# Patient Record
Sex: Male | Born: 1960 | Race: Black or African American | Hispanic: No | Marital: Single | Smoking: Former smoker
Health system: Southern US, Community
[De-identification: ages and names within clinical notes are randomized; demographics above are authoritative.]

## PROBLEM LIST (undated history)

## (undated) DIAGNOSIS — K219 Gastro-esophageal reflux disease without esophagitis: Secondary | ICD-10-CM

## (undated) DIAGNOSIS — R519 Headache, unspecified: Secondary | ICD-10-CM

## (undated) DIAGNOSIS — J309 Allergic rhinitis, unspecified: Secondary | ICD-10-CM

## (undated) DIAGNOSIS — E785 Hyperlipidemia, unspecified: Secondary | ICD-10-CM

## (undated) DIAGNOSIS — K635 Polyp of colon: Secondary | ICD-10-CM

## (undated) DIAGNOSIS — R51 Headache: Secondary | ICD-10-CM

## (undated) DIAGNOSIS — R030 Elevated blood-pressure reading, without diagnosis of hypertension: Secondary | ICD-10-CM

## (undated) DIAGNOSIS — K029 Dental caries, unspecified: Secondary | ICD-10-CM

## (undated) DIAGNOSIS — C801 Malignant (primary) neoplasm, unspecified: Secondary | ICD-10-CM

## (undated) DIAGNOSIS — E039 Hypothyroidism, unspecified: Secondary | ICD-10-CM

## (undated) DIAGNOSIS — Z923 Personal history of irradiation: Secondary | ICD-10-CM

## (undated) DIAGNOSIS — J189 Pneumonia, unspecified organism: Secondary | ICD-10-CM

## (undated) DIAGNOSIS — D126 Benign neoplasm of colon, unspecified: Secondary | ICD-10-CM

## (undated) DIAGNOSIS — K648 Other hemorrhoids: Secondary | ICD-10-CM

## (undated) DIAGNOSIS — C329 Malignant neoplasm of larynx, unspecified: Secondary | ICD-10-CM

## (undated) DIAGNOSIS — M1990 Unspecified osteoarthritis, unspecified site: Secondary | ICD-10-CM

## (undated) HISTORY — DX: Benign neoplasm of colon, unspecified: D12.6

## (undated) HISTORY — DX: Hyperlipidemia, unspecified: E78.5

## (undated) HISTORY — DX: Allergic rhinitis, unspecified: J30.9

## (undated) HISTORY — DX: Other hemorrhoids: K64.8

## (undated) HISTORY — DX: Unspecified osteoarthritis, unspecified site: M19.90

## (undated) HISTORY — DX: Malignant neoplasm of larynx, unspecified: C32.9

## (undated) HISTORY — DX: Gastro-esophageal reflux disease without esophagitis: K21.9

## (undated) HISTORY — DX: Polyp of colon: K63.5

---

## 2000-03-30 ENCOUNTER — Emergency Department (HOSPITAL_COMMUNITY): Admission: EM | Admit: 2000-03-30 | Discharge: 2000-03-30 | Payer: Self-pay | Admitting: Emergency Medicine

## 2001-08-04 ENCOUNTER — Emergency Department (HOSPITAL_COMMUNITY): Admission: EM | Admit: 2001-08-04 | Discharge: 2001-08-04 | Payer: Self-pay | Admitting: Emergency Medicine

## 2002-06-26 ENCOUNTER — Emergency Department (HOSPITAL_COMMUNITY): Admission: EM | Admit: 2002-06-26 | Discharge: 2002-06-26 | Payer: Self-pay | Admitting: Emergency Medicine

## 2002-06-26 ENCOUNTER — Encounter: Payer: Self-pay | Admitting: Emergency Medicine

## 2010-12-08 ENCOUNTER — Encounter: Payer: Self-pay | Admitting: Internal Medicine

## 2010-12-08 ENCOUNTER — Ambulatory Visit (INDEPENDENT_AMBULATORY_CARE_PROVIDER_SITE_OTHER): Payer: Self-pay | Admitting: Internal Medicine

## 2010-12-08 VITALS — BP 128/93 | HR 76 | Temp 99.0°F | Ht 65.0 in | Wt 146.0 lb

## 2010-12-08 DIAGNOSIS — M25569 Pain in unspecified knee: Secondary | ICD-10-CM

## 2010-12-08 DIAGNOSIS — Z8042 Family history of malignant neoplasm of prostate: Secondary | ICD-10-CM

## 2010-12-08 DIAGNOSIS — M1710 Unilateral primary osteoarthritis, unspecified knee: Secondary | ICD-10-CM

## 2010-12-08 DIAGNOSIS — Z0000 Encounter for general adult medical examination without abnormal findings: Secondary | ICD-10-CM

## 2010-12-08 LAB — LIPID PANEL
Cholesterol: 297 mg/dL — ABNORMAL HIGH (ref 0–200)
HDL: 48 mg/dL (ref >39)
LDL Cholesterol: 222 mg/dL — ABNORMAL HIGH (ref 0–99)
Triglycerides: 136 mg/dL (ref <150)
VLDL: 27 mg/dL (ref 0–40)

## 2010-12-08 LAB — CBC WITH DIFFERENTIAL/PLATELET
Basophils Absolute: 0 10*3/uL (ref 0.0–0.1)
Basophils Relative: 1 % (ref 0–1)
Eosinophils Absolute: 0.2 10*3/uL (ref 0.0–0.7)
HCT: 49.6 % (ref 39.0–52.0)
Hemoglobin: 16.4 g/dL (ref 13.0–17.0)
Lymphocytes Relative: 32 % (ref 12–46)
Lymphs Abs: 2.5 10*3/uL (ref 0.7–4.0)
MCH: 30.7 pg (ref 26.0–34.0)
MCHC: 33.1 g/dL (ref 30.0–36.0)
MCV: 92.9 fL (ref 78.0–100.0)
Monocytes Absolute: 0.6 10*3/uL (ref 0.1–1.0)
Monocytes Relative: 8 % (ref 3–12)
Neutro Abs: 4.5 10*3/uL (ref 1.7–7.7)
Neutrophils Relative %: 58 % (ref 43–77)
Platelets: 304 10*3/uL (ref 150–400)
RBC: 5.34 MIL/uL (ref 4.22–5.81)
RDW: 15.6 % — ABNORMAL HIGH (ref 11.5–15.5)
WBC: 7.8 10*3/uL (ref 4.0–10.5)

## 2010-12-08 LAB — PSA: PSA: 3.77 ng/mL (ref <=4.00)

## 2010-12-08 NOTE — Assessment & Plan Note (Signed)
Doesn't complain of much pain, and is not on any medications.

## 2010-12-08 NOTE — Patient Instructions (Signed)
Please make a follow up appointment as needed. I will call you a call if the lab test shows some abnormalities.

## 2010-12-08 NOTE — Assessment & Plan Note (Addendum)
Patient worried about him having prostate cancer as his uncle was recently diagnosed and on radiation therapy. He wanted to have the screening test done for prostate cancer. I discussed with him at length about no recommendations for benefits of screening for prostate cancer. Also discussed with him about the complications of procedures after positive screening test- in terms of biopsy, possible prostatectomy, impotence, urinary incontinence-without much survival benefit. He sounded understanding but still wanted to proceed with the PSA testing. I would check PSA levels today per patient's insistence. Also I will check CMP, CBC with differential, TSH and lipid panel for routine screening and risk stratification. I explained him that I will call him if any of the lab results are abnormal, and that if the lab results are normal he can call the clinic and make an appointment when necessary basis.

## 2010-12-08 NOTE — Progress Notes (Signed)
  Subjective:    Patient ID: Jonathan [name], male    DOB: 1961/03/09, 50 y.o.   MRN: [id number]  HPI Jonathan [name] is a pleasant 50 year old man with no significant past medical history who comes to the clinic for the first time for routine health checkup and prostate cancer screening. His uncle was recently diagnosed with prostate cancer and his radiation therapy and so he is worried about him being at high risk of having posterior cancer and so wants to get it checked. He also hasn't seen a doctor for many years now and does not have any significant past medical history except bilateral knee arthritis for which he does not take any pain medications as he believes in healing by natural process than taking medications. His blood pressure and other vitals were within normal limits. He did complain of some allergies and sinus congestion- which happens all the time and is not new for him.  I offered him to start him on some allergy medications but he denied. He denies any chest pain, short of breath, abdominal pain, diarrhea, headache, vision changes, nausea, vomiting, fever, chills, recent weight loss.    Review of Systems    as per history of present illness, all other systems reviewed and negative. Objective:   Physical Exam Constitutional: Vital signs reviewed.  Patient is a well-developed and well-nourished in no acute distress and cooperative with exam. Alert and oriented x3.  Head: Normocephalic and atraumatic Mouth: no erythema or exudates, MMM Eyes: PERRL, EOMI, conjunctivae normal, No scleral icterus.  Neck: Supple, Trachea midline normal ROM, No JVD, mass, thyromegaly, or carotid bruit present.  Cardiovascular: RRR, S1 normal, S2 normal, no MRG, pulses symmetric and intact bilaterally Pulmonary/Chest: CTAB, no wheezes, rales, or rhonchi Abdominal: Soft. Non-tender, non-distended, bowel sounds are normal, no masses, organomegaly, or guarding present.  Musculoskeletal: No joint deformities,  erythema, or stiffness, ROM full and no nontender Neurological: A&O x3, Strenght is normal and symmetric bilaterally, cranial nerve II-XII are grossly intact, no focal motor deficit, sensory intact to light touch bilaterally.  Skin: Warm, dry and intact. No rash, cyanosis, or clubbing.  Psychiatric: Normal mood and affect. speech and behavior is normal. Judgment and thought content normal. Cognition and memory are normal.          Assessment & Plan:

## 2010-12-09 ENCOUNTER — Telehealth: Payer: Self-pay | Admitting: Internal Medicine

## 2010-12-09 DIAGNOSIS — E785 Hyperlipidemia, unspecified: Secondary | ICD-10-CM | POA: Insufficient documentation

## 2010-12-09 LAB — COMPLETE METABOLIC PANEL WITH GFR
ALT: 13 U/L (ref 0–53)
AST: 22 U/L (ref 0–37)
Albumin: 5.1 g/dL (ref 3.5–5.2)
Alkaline Phosphatase: 62 U/L (ref 39–117)
BUN: 13 mg/dL (ref 6–23)
Calcium: 10.3 mg/dL (ref 8.4–10.5)
Creat: 1.13 mg/dL (ref 0.50–1.35)
GFR, Est Non African American: >60 mL/min (ref >60)
Glucose, Bld: 92 mg/dL (ref 70–99)
Potassium: 4.6 mEq/L (ref 3.5–5.3)
Sodium: 141 mEq/L (ref 135–145)
Total Bilirubin: 0.6 mg/dL (ref 0.3–1.2)
Total Protein: 8.3 g/dL (ref 6.0–8.3)

## 2010-12-09 MED ORDER — PRAVASTATIN SODIUM 40 MG PO TABS: 40.0000 mg | ORAL_TABLET | Freq: Every day | ORAL | Status: DC

## 2010-12-09 NOTE — Telephone Encounter (Signed)
12/09/2010, 1:45 PM- I called [name] with a lab results which we drew yesterday. His PSA was less than 4, CMP and CBC were within normal limits. His fasting lipid panel showed cholesterol of 297 and LDL cholesterol of 222. I will send, the prescription for Pravachol 40 mg a day to [facility] and he will pick it up tomorrow. Advised him to follow regular exercise and low cholesterol/fat diet-he said that his dad had triple bypass 3 years before and so he will follow the recommendations and do exercise follow diet and take medications. Advised him to make an appointment in a year for lipid profile check and meanwhile if needed can make an early appointment. He sounded understanding.

## 2010-12-09 NOTE — Progress Notes (Signed)
I have reviewed and discussed the care of this patient in detail with the resident physician including pertinent patient records, physical exam findings and data. I agree with details of this encounter.   

## 2010-12-09 NOTE — Assessment & Plan Note (Addendum)
Total cholesterol 297, LDL cholesterol 222. Called patient to pick up the prescription of Pravachol from [facility]. Also see the documentation in this note for further details of conversation.

## 2010-12-15 ENCOUNTER — Other Ambulatory Visit: Payer: Self-pay | Admitting: Internal Medicine

## 2010-12-15 DIAGNOSIS — E785 Hyperlipidemia, unspecified: Secondary | ICD-10-CM

## 2010-12-15 NOTE — Progress Notes (Signed)
I called Jonathan [name] and his home number and left a message for him to call the clinic to get appointment day and time for his lab test in 3 months.

## 2011-03-06 ENCOUNTER — Other Ambulatory Visit (INDEPENDENT_AMBULATORY_CARE_PROVIDER_SITE_OTHER): Payer: Self-pay

## 2011-03-06 DIAGNOSIS — E785 Hyperlipidemia, unspecified: Secondary | ICD-10-CM

## 2011-03-07 LAB — COMPLETE METABOLIC PANEL WITH GFR
ALT: 17 U/L (ref 0–53)
AST: 21 U/L (ref 0–37)
Albumin: 4.9 g/dL (ref 3.5–5.2)
Alkaline Phosphatase: 57 U/L (ref 39–117)
BUN: 10 mg/dL (ref 6–23)
Chloride: 105 mEq/L (ref 96–112)
GFR, Est African American: >89 mL/min
GFR, Est Non African American: 82 mL/min
Glucose, Bld: 90 mg/dL (ref 70–99)
Potassium: 4.3 mEq/L (ref 3.5–5.3)
Sodium: 140 mEq/L (ref 135–145)
Total Bilirubin: 0.3 mg/dL (ref 0.3–1.2)

## 2012-01-03 ENCOUNTER — Encounter: Payer: Self-pay | Admitting: Internal Medicine

## 2012-01-03 ENCOUNTER — Ambulatory Visit (INDEPENDENT_AMBULATORY_CARE_PROVIDER_SITE_OTHER): Payer: Self-pay | Admitting: Internal Medicine

## 2012-01-03 VITALS — BP 149/97 | HR 95 | Temp 98.8°F | Ht 65.5 in | Wt 147.7 lb

## 2012-01-03 DIAGNOSIS — Z800 Family history of malignant neoplasm of digestive organs: Secondary | ICD-10-CM | POA: Insufficient documentation

## 2012-01-03 DIAGNOSIS — N4889 Other specified disorders of penis: Secondary | ICD-10-CM

## 2012-01-03 DIAGNOSIS — Z8042 Family history of malignant neoplasm of prostate: Secondary | ICD-10-CM

## 2012-01-03 DIAGNOSIS — E785 Hyperlipidemia, unspecified: Secondary | ICD-10-CM

## 2012-01-03 DIAGNOSIS — N489 Disorder of penis, unspecified: Secondary | ICD-10-CM | POA: Insufficient documentation

## 2012-01-03 LAB — LIPID PANEL
Cholesterol: 244 mg/dL — ABNORMAL HIGH (ref 0–200)
VLDL: 38 mg/dL (ref 0–40)

## 2012-01-03 MED ORDER — PRAVASTATIN SODIUM 40 MG PO TABS: 40.0000 mg | ORAL_TABLET | Freq: Every day | ORAL | Status: DC

## 2012-01-03 NOTE — Assessment & Plan Note (Addendum)
PSA 3.77 about a year before. No repeat testing for now. Discussed with him about this and he verbalized understanding.

## 2012-01-03 NOTE — Assessment & Plan Note (Signed)
Repeat lipid panel today. Continue Pravachol 40 mg daily. Change the dose if felt appropriate after results.

## 2012-01-03 NOTE — Assessment & Plan Note (Signed)
Unclear if nevus or seborrheic keratosis- 1/4 inch longest. Dr. Josem Kaufmann evaluated the lesion with me. Considering the appearance- we decided to do watchful waiting and if color, size changes- refer to dermatology. Also if Dr. Lonzo Cloud happens to be in clinic next visit, will get evaluated by her.

## 2012-01-03 NOTE — Patient Instructions (Signed)
Please make followup appointment in a year. Keep an eye on the lesion on penis- if it grows in size or changes color call to make an early appointment. Meanwhile keep taking the cholesterol pill daily. I will call her with the lab results if anything is abnormal. We'll try to get the appointment for colonoscopy

## 2012-01-03 NOTE — Progress Notes (Addendum)
  Subjective:    Patient ID: Jonathan [name], male    DOB: Aug 05, 1960, 51 y.o.   MRN: [id number]  HPI patient is a pleasant 51 year old man with past history of hyperlipidemia, family history of colon cancer ( maternal aunt)  and prostate cancer ( maternal uncle )- comes the clinic for regular followup visit. He is out of his cholesterol medication and wants refill. Also was concerned about his and being diagnosed of colon cancer at age 51 and wants to get screened for that. Discussed with him about no need for further prostate cancer screening testing for now as his last PSA was 3.77 about a year before. He denies any fever, chills, nausea vomiting, abdominal pain, chest pain, short of breath, diarrhea.  He does complain of penile skin lesion on the left lateral side of shaft. The lesion was flat since childhood and now it's raised for past one and half years. Dark Pitta colored- uniform, well-demarcated raised mole like lesion. No pain or itching.  Review of Systems    As per history of present illness, all other systems reviewed and negative. Objective:   Physical Exam Constitutional: Vital signs reviewed.  Patient is a well-developed and well-nourished in no acute distress and cooperative with exam. Alert and oriented x3.  Head: Normocephalic and atraumatic Mouth: no erythema or exudates, MMM Eyes: PERRL, EOMI, conjunctivae normal, No scleral icterus.  Neck: Supple, Trachea midline normal ROM, No JVD Cardiovascular: RRR, S1 normal, S2 normal, no MRG. Pulmonary/Chest: CTAB, no wheezes, rales, or rhonchi Abdominal: Soft. Non-tender, non-distended Musculoskeletal: No joint deformities, erythema, or stiffness, ROM full and no nontender Neurological: A&O x3, Strength is normal and symmetric bilaterally, cranial nerve II-XII are grossly intact, no focal motor deficit, sensory intact to light touch bilaterally.  Skin: Warm, dry and intact. No rash, cyanosis, or clubbing.  Psychiatric: Normal mood  and affect. speech and behavior is normal. Judgment and thought content normal. Cognition and memory are normal.  Penile exam: 1/4 inch- raised well-demarcated, dark [place]ott pigmented lesion.         Assessment & Plan:

## 2012-01-03 NOTE — Assessment & Plan Note (Signed)
Maternal aunt diagnosed with colon cancer at age of 51. Patient wants to be screened.  He is 69 and is in the age range of colon cancer screening. Discussed with him on FOBT versus colonoscopy. He chose colonoscopy. He has orange card- will make a referral.

## 2012-01-11 ENCOUNTER — Telehealth: Payer: Self-pay | Admitting: Internal Medicine

## 2012-01-11 DIAGNOSIS — E785 Hyperlipidemia, unspecified: Secondary | ICD-10-CM

## 2012-01-11 MED ORDER — SIMVASTATIN 40 MG PO TABS: 40.0000 mg | ORAL_TABLET | Freq: Every evening | ORAL | Status: DC

## 2012-01-11 NOTE — Telephone Encounter (Signed)
Called patient to change his cholesterol medication from pravachol to Simvastatin 40mg  daily.  Left voice mail. Prescription changes made and sent to pharmacy.

## 2012-01-11 NOTE — Telephone Encounter (Signed)
Talked with pt and aware of change in chol med per Dr Allena Katz.

## 2012-01-15 ENCOUNTER — Telehealth: Payer: Self-pay | Admitting: *Deleted

## 2012-01-15 DIAGNOSIS — E785 Hyperlipidemia, unspecified: Secondary | ICD-10-CM

## 2012-01-15 NOTE — Telephone Encounter (Signed)
Pt called and asked if you could change his Rx for simvastatin to something less expensive at [facility]. Med was changed at last visit and is to costly.

## 2012-01-17 ENCOUNTER — Telehealth: Payer: Self-pay | Admitting: Internal Medicine

## 2012-01-17 MED ORDER — PRAVASTATIN SODIUM 80 MG PO TABS: 80.0000 mg | ORAL_TABLET | Freq: Every evening | ORAL | Status: DC

## 2012-01-17 NOTE — Telephone Encounter (Signed)
Changed it to Pravachol 80 mg daily. Eliu Batch.

## 2012-01-17 NOTE — Telephone Encounter (Signed)
Change Zocor to Pravachol 80 mg daily due to cost issues. Prescription sent to pharmacy.

## 2012-01-18 ENCOUNTER — Telehealth: Payer: Self-pay | Admitting: *Deleted

## 2012-01-18 DIAGNOSIS — E785 Hyperlipidemia, unspecified: Secondary | ICD-10-CM

## 2012-01-18 MED ORDER — PRAVASTATIN SODIUM 40 MG PO TABS: 80.0000 mg | ORAL_TABLET | Freq: Every day | ORAL | Status: DC

## 2012-01-18 NOTE — Telephone Encounter (Addendum)
Pravastatin 80mg  is not on $4 list - please change to 40mg  twice daily which will be $8/month. Thanks  Changes made.  Thanks.

## 2012-02-07 ENCOUNTER — Encounter: Payer: Self-pay | Admitting: Internal Medicine

## 2012-03-08 ENCOUNTER — Ambulatory Visit (AMBULATORY_SURGERY_CENTER): Payer: No Typology Code available for payment source

## 2012-03-08 VITALS — Ht 65.0 in | Wt 149.4 lb

## 2012-03-08 DIAGNOSIS — Z1211 Encounter for screening for malignant neoplasm of colon: Secondary | ICD-10-CM

## 2012-03-08 MED ORDER — MOVIPREP 100 G PO SOLR: 1.0000 | Freq: Once | ORAL | Status: DC

## 2012-03-18 ENCOUNTER — Encounter: Payer: Self-pay | Admitting: Internal Medicine

## 2012-03-18 ENCOUNTER — Ambulatory Visit (AMBULATORY_SURGERY_CENTER): Payer: No Typology Code available for payment source | Admitting: Internal Medicine

## 2012-03-18 VITALS — BP 139/96 | HR 77 | Temp 96.4°F | Resp 20 | Ht 65.0 in | Wt 149.0 lb

## 2012-03-18 DIAGNOSIS — D126 Benign neoplasm of colon, unspecified: Secondary | ICD-10-CM

## 2012-03-18 DIAGNOSIS — Z1211 Encounter for screening for malignant neoplasm of colon: Secondary | ICD-10-CM

## 2012-03-18 HISTORY — PX: COLONOSCOPY: SHX174

## 2012-03-18 MED ORDER — SODIUM CHLORIDE 0.9 % IV SOLN: 500.0000 mL | INTRAVENOUS | Status: DC

## 2012-03-18 NOTE — Progress Notes (Signed)
Called to room to assist during endoscopic procedure.  Patient ID and intended procedure confirmed with present staff. Received instructions for my participation in the procedure from the performing physician.  

## 2012-03-18 NOTE — Progress Notes (Signed)
Patient did not experience any of the following events: a burn prior to discharge; a fall within the facility; wrong site/side/patient/procedure/implant event; or a hospital transfer or hospital admission upon discharge from the facility. (G8907) Patient did not have preoperative order for IV antibiotic SSI prophylaxis. (G8918)  

## 2012-03-18 NOTE — Op Note (Signed)
 [facility] [address] [place], [address]   COLONOSCOPY PROCEDURE REPORT  PATIENT: Jonathan, [name]  MR#: [id number] BIRTHDATE: 01/03/1961 , 51  yrs. old GENDER: Male ENDOSCOPIST: Beverley Fiedler, MD REFERRED XB:[id number], Ravi PROCEDURE DATE:  03/18/2012 PROCEDURE:   Colonoscopy with snare polypectomy ASA CLASS:   Class II INDICATIONS:average risk screening and first colonoscopy. MEDICATIONS: MAC sedation, administered by CRNA and propofol (Diprivan) 350mg  IV  DESCRIPTION OF PROCEDURE:   After the risks benefits and alternatives of the procedure were thoroughly explained, informed consent was obtained.  A digital rectal exam revealed no rectal mass.   The LB [id number]  endoscope was introduced through the anus and advanced to the cecum, which was identified by both the appendix and ileocecal valve. No adverse events experienced. The quality of the prep was good, using MoviPrep  The instrument was then slowly withdrawn as the colon was fully examined.      COLON FINDINGS: A sessile polyp measuring 3 mm in size was found in the ascending colon.  A polypectomy was performed with a cold snare.  The resection was complete and the polyp tissue was not retrieved.   A sessile polyp measuring 7 mm in size was found in the transverse colon.  A polypectomy was performed using snare cautery.  The resection was complete and the polyp tissue was completely retrieved.   A pedunculated polyp measuring 20 mm in size was found in the sigmoid colon.  A polypectomy was performed using snare cautery.  The resection was complete and the polyp tissue was completely retrieved.   Mild diverticulosis was noted in the sigmoid colon.   Moderate sized internal hemorrhoids were found.  Retroflexed views revealed internal hemorrhoids. The time to cecum=2 minutes 49 seconds.  Withdrawal time=14 minutes 47 seconds.  The scope was withdrawn and the procedure completed.  COMPLICATIONS:  There were no complications.  ENDOSCOPIC IMPRESSION: 1.   Sessile polyp measuring 3 mm in size was found in the ascending colon; polypectomy was performed with a cold snare 2.   Sessile polyp measuring 7 mm in size was found in the transverse colon; polypectomy was performed using snare cautery 3.   Pedunculated polyp measuring 20 mm in size was found in the sigmoid colon; polypectomy was performed using snare cautery 4.   Mild diverticulosis was noted in the sigmoid colon 5.   Moderate sized internal hemorrhoids  RECOMMENDATIONS: 1.  Hold aspirin, aspirin products, and anti-inflammatory medication for 2 weeks. 2.  Await pathology results 3.  High fiber diet 4.  If the polyps removed today are proven to be adenomatous (pre-cancerous) polyps, you will need a colonoscopy in 3 years. Otherwise you should continue to follow colorectal cancer screening guidelines for "routine risk" patients with a colonoscopy in 10 years.  You will receive a letter within 1-2 weeks with the results of your biopsy as well as final recommendations.  Please call my office if you have not received a letter after 3 weeks.   eSigned:  Beverley Fiedler, MD 03/18/2012 3:14 PM   cc: Lyn Hollingshead MD and The Patient   PATIENT NAME:  [name], Jonathan MR#: [id number]

## 2012-03-18 NOTE — Patient Instructions (Signed)
Colon polyps removed today, see handouts given. Hold aspirin, aspirin products, and ant-inflammatory medications for 2 weeks!! Diverticulosis and hemorrhoids also seen today, try to follow high fiber diet,  handouts given. Call us with any questions or concerns. Thank you!!  YOU HAD AN ENDOSCOPIC PROCEDURE TODAY AT THE [facility]: Refer to the procedure report that was given to you for any specific questions about what was found during the examination.  If the procedure report does not answer your questions, please call your gastroenterologist to clarify.  If you requested that your care partner not be given the details of your procedure findings, then the procedure report has been included in a sealed envelope for you to review at your convenience later.  YOU SHOULD EXPECT: Some feelings of bloating in the abdomen. Passage of more gas than usual.  Walking can help get rid of the air that was put into your GI tract during the procedure and reduce the bloating. If you had a lower endoscopy (such as a colonoscopy or flexible sigmoidoscopy) you may notice spotting of blood in your stool or on the toilet paper. If you underwent a bowel prep for your procedure, then you may not have a normal bowel movement for a few days.  DIET: Your first meal following the procedure should be a light meal and then it is ok to progress to your normal diet.  A half-sandwich or bowl of soup is an example of a good first meal.  Heavy or fried foods are harder to digest and may make you feel nauseous or bloated.  Likewise meals heavy in dairy and vegetables can cause extra gas to form and this can also increase the bloating.  Drink plenty of fluids but you should avoid alcoholic beverages for 24 hours.  ACTIVITY: Your care partner should take you home directly after the procedure.  You should plan to take it easy, moving slowly for the rest of the day.  You can resume normal activity the day after the procedure however  you should NOT DRIVE or use heavy machinery for 24 hours (because of the sedation medicines used during the test).    SYMPTOMS TO REPORT IMMEDIATELY: A gastroenterologist can be reached at any hour.  During normal business hours, 8:30 AM to 5:00 PM Monday through Friday, call [phone].  After hours and on weekends, please call the GI answering service at [phone] who will take a message and have the physician on call contact you.   Following lower endoscopy (colonoscopy or flexible sigmoidoscopy):  Excessive amounts of blood in the stool  Significant tenderness or worsening of abdominal pains  Swelling of the abdomen that is new, acute  Fever of 100F or higher  FOLLOW UP: If any biopsies were taken you will be contacted by phone or by letter within the next 1-3 weeks.  Call your gastroenterologist if you have not heard about the biopsies in 3 weeks.  Our staff will call the home number listed on your records the next business day following your procedure to check on you and address any questions or concerns that you may have at that time regarding the information given to you following your procedure. This is a courtesy call and so if there is no answer at the home number and we have not heard from you through the emergency physician on call, we will assume that you have returned to your regular daily activities without incident.  SIGNATURES/CONFIDENTIALITY: You and/or your care partner have  signed paperwork which will be entered into your electronic medical record.  These signatures attest to the fact that that the information above on your After Visit Summary has been reviewed and is understood.  Full responsibility of the confidentiality of this discharge information lies with you and/or your care-partner.

## 2012-03-19 ENCOUNTER — Telehealth: Payer: Self-pay

## 2012-03-19 NOTE — Telephone Encounter (Signed)
Left a message on the pt's answering machine at [phone] to call if any questions or concerns. Maw

## 2012-03-29 ENCOUNTER — Encounter: Payer: Self-pay | Admitting: Internal Medicine

## 2012-05-03 ENCOUNTER — Other Ambulatory Visit: Payer: Self-pay | Admitting: *Deleted

## 2012-05-03 DIAGNOSIS — E785 Hyperlipidemia, unspecified: Secondary | ICD-10-CM

## 2012-05-03 NOTE — Telephone Encounter (Signed)
Pt called stating [facility] no longer has pravastatin on the $4 list.  Can you change to Lovastatin 10 or 20 mg?

## 2012-05-06 MED ORDER — LOVASTATIN 40 MG PO TABS: 80.0000 mg | ORAL_TABLET | Freq: Every day | ORAL | Status: DC

## 2012-05-06 NOTE — Telephone Encounter (Signed)
Prescription for Lovastatin 80 mg which is equivalent to his previous Pravastatin dose. Thanks.

## 2012-07-25 ENCOUNTER — Ambulatory Visit: Payer: No Typology Code available for payment source

## 2012-07-29 ENCOUNTER — Ambulatory Visit: Payer: No Typology Code available for payment source

## 2013-01-03 ENCOUNTER — Encounter: Payer: No Typology Code available for payment source | Admitting: Internal Medicine

## 2013-01-10 ENCOUNTER — Encounter: Payer: No Typology Code available for payment source | Admitting: Internal Medicine

## 2013-01-17 ENCOUNTER — Ambulatory Visit (INDEPENDENT_AMBULATORY_CARE_PROVIDER_SITE_OTHER): Payer: No Typology Code available for payment source | Admitting: Internal Medicine

## 2013-01-17 ENCOUNTER — Encounter: Payer: Self-pay | Admitting: Internal Medicine

## 2013-01-17 VITALS — BP 161/111 | HR 80 | Temp 98.0°F | Ht 65.0 in | Wt 141.5 lb

## 2013-01-17 DIAGNOSIS — I10 Essential (primary) hypertension: Secondary | ICD-10-CM

## 2013-01-17 DIAGNOSIS — F17200 Nicotine dependence, unspecified, uncomplicated: Secondary | ICD-10-CM

## 2013-01-17 DIAGNOSIS — E785 Hyperlipidemia, unspecified: Secondary | ICD-10-CM

## 2013-01-17 LAB — COMPREHENSIVE METABOLIC PANEL
ALT: 16 U/L (ref 0–53)
AST: 21 U/L (ref 0–37)
Creat: 1.03 mg/dL (ref 0.50–1.35)
Total Bilirubin: 0.5 mg/dL (ref 0.3–1.2)

## 2013-01-17 LAB — LIPID PANEL
Cholesterol: 175 mg/dL (ref 0–200)
LDL Cholesterol: 97 mg/dL (ref 0–99)
Triglycerides: 74 mg/dL (ref <150)

## 2013-01-17 LAB — CBC
HCT: 45.2 % (ref 39.0–52.0)
Hemoglobin: 15.8 g/dL (ref 13.0–17.0)
Platelets: 290 10*3/uL (ref 150–400)
RBC: 5.01 MIL/uL (ref 4.22–5.81)
RDW: 14.8 % (ref 11.5–15.5)

## 2013-01-17 NOTE — Progress Notes (Signed)
  Subjective:    Patient ID: Jonathan [name], male    DOB: 07-11-1960, 52 y.o.   MRN: [id number]  HPI Jonathan [name] is a 52 yo man coming in to follow up on statin medication started last year. The patient has no complaints during this visit and only wanted to reacquaint himself with his new physician. Patient was found to be slightly hypertensive during his initial intake 160s over 80s. Patient denied any history of hypertension or medications for blood pressure management. Patient denied any chest pain, shortness of breath, palpitations, headaches,any recent weight gain or lower extremity edema. Patient has not experienced any dysphagias, dysarthrias, or noticeable muscle weakness at any point during the past year.he does have a family history of hypertension but no personal history of hypertension, strokes or MIs.  In regards to the statin the patient denied any muscle weakness, muscle cramps, muscle pain, gross bloody or red-colored urine or any other changes in appetite or skin findings.  Patient main concern is that he continues to smoke. He has previously quit in the past but then feels that social stressors such as job her history appears.  Patient family, social, personal medical, and surgical history along with medications was all reviewed with the patient.  Review of Systems Otherwise negative unless listed in history of present illness    Objective:   Physical Exam Filed Vitals:   01/17/13 1358  BP: 160/104  Pulse: 81  Temp: 98 F (36.7 C)    General: sitting in chair NAD HEENT: PERRL, EOMI, no scleral icterus Cardiac: RRR, no rubs, murmurs or gallops Pulm: clear to auscultation bilaterally, moving normal volumes of air Abd: soft, nontender, nondistended, BS present Ext: warm and well perfused, no pedal edema Neuro: alert and oriented X3, cranial nerves II-XII grossly intact     Assessment & Plan:  1. Hyperlipidemia: Pt last LDL was in 2012 and 164. Pt came fasting today. He has  been compliant with statin during this year without side effects.  -Lipid panel repeated  2.smoking cessation: Patient is at precontemplation phase extensive counseling in terms of benefits of cessation was had with the patient. -Education and referral material was given  3.hypertension: Patient's blood pressure in the 160s over 80s during initial intake and after 30 minutes during the visit. His BP have otherwise been in pre-hypertensive stage.  BP Readings from Last 3 Encounters:  01/17/13 160/104  03/18/12 139/96  01/03/12 149/97   -Recheck in 2 weeks -cmet and cbc performed -Education regards to the necessity of starting medication was had -Education in terms of smoking cessation was also reinforced with the patient  Pt discussed with Dr. Josem Kaufmann

## 2013-01-17 NOTE — Patient Instructions (Signed)
It was a pleasure to me today.  I hope that you will find a smoker cessation materials beneficial and if you have any questions or like to discuss this further with me I be more than happy to future appointment.  In terms of your hypertension that we found today to be important to repeat this and if it continues to be elevated that we would need to start or consider medication at that time.  Some basic blood work was completed and if anything is abnormal our clinic we'll contact you if not please presumed that they are all normal and we can discuss at your followup visit.  Please do not hesitate to call the clinic: Dr. Burtis Junes    [phone]

## 2013-01-20 NOTE — Progress Notes (Signed)
Case discussed with Dr. Sadek soon after the resident saw the patient.  We reviewed the resident's history and exam and pertinent patient test results.  I agree with the assessment, diagnosis and plan of care documented in the resident's note. 

## 2013-02-07 ENCOUNTER — Encounter: Payer: No Typology Code available for payment source | Admitting: Internal Medicine

## 2013-02-21 ENCOUNTER — Ambulatory Visit: Payer: No Typology Code available for payment source

## 2013-02-21 ENCOUNTER — Ambulatory Visit (INDEPENDENT_AMBULATORY_CARE_PROVIDER_SITE_OTHER): Payer: No Typology Code available for payment source | Admitting: Internal Medicine

## 2013-02-21 ENCOUNTER — Encounter: Payer: Self-pay | Admitting: Internal Medicine

## 2013-02-21 VITALS — BP 146/94 | HR 105 | Temp 98.6°F | Ht 65.0 in | Wt 139.4 lb

## 2013-02-21 DIAGNOSIS — I10 Essential (primary) hypertension: Secondary | ICD-10-CM

## 2013-02-21 NOTE — Patient Instructions (Signed)
LIFESTYLE TIPS TO HELP WITH YOUR BLOOD PRESSURE CONTROL  WEIGHT REDUCTION:  Strategies: A healthy weight loss program includes:  A calorie restricted diet based on individual calorie needs.   Increased physical activity (exercise).  An exercise program is just as important as the right low-calorie diet.    An unhealthy weight loss program includes:  Fasting.   Fad diets.   Supplements and drugs.  These choices do not succeed in long-term weight control.   Home Care Instructions: To help you make the needed dietary changes:   Exercise and perform physical activity as directed by your caregiver.   Keep a daily record of everything you eat. There are many free websites to help you with this. It may be helpful to measure your foods so you can determine if you are eating the correct portion sizes.   Use low-calorie cookbooks or take special cooking classes.   Avoid alcohol. Drink more water and drinks with no calories.   Take vitamins and supplements only as recommended by your caregiver.   Weight loss support groups, Registered Dieticians, counselors, and stress reduction education can also be very helpful.   ________________________________________________________________________  DASH DIET:  The DASH diet stands for "Dietary Approaches to Stop Hypertension." It is a healthy eating plan that has been shown to reduce high blood pressure (hypertension) in as little as 14 days, while also possibly providing other significant health benefits. These other health benefits include reducing the risk of breast cancer after menopause and reducing the risk of type 2 diabetes, heart disease, colon cancer, and stroke. Health benefits also include weight loss and slowing kidney failure in patients with chronic kidney disease.   Diet guidelines: Limit salt (sodium). Your diet should contain less than 1500 mg of sodium daily.  Limit refined or processed carbohydrates. Your diet should  include mostly whole grains. Desserts and added sugars should be used sparingly.  Include small amounts of heart-healthy fats. These types of fats include nuts, oils, and tub margarine. Limit saturated and trans fats. These fats have been shown to be harmful in the body.   Choosing Foods: The following food groups are based on a 2000 calorie diet. See your Registered Dietitian for individual calorie needs.  Grains and Grain Products (6 to 8 servings daily)  Eat More Often: Whole-wheat bread, Sidor rice, whole-grain or wheat pasta, quinoa, popcorn without added fat or salt (air popped).  Eat Less Often: White bread, white pasta, white rice, cornbread.  Vegetables (4 to 5 servings daily)  Eat More Often: Fresh, frozen, and canned vegetables. Vegetables may be raw, steamed, roasted, or grilled with a minimal amount of fat.  Eat Less Often/Avoid: Creamed or fried vegetables. Vegetables in a cheese sauce.  Fruit (4 to 5 servings daily)  Eat More Often: All fresh, canned (in natural juice), or frozen fruits. Dried fruits without added sugar. One hundred percent fruit juice ( cup [237 mL] daily).  Eat Less Often: Dried fruits with added sugar. Canned fruit in light or heavy syrup.  Lean Meats, Fish, and Poultry (2 servings or less daily. One serving is 3 to 4 oz [85-114 g]).  Eat More Often: Ninety percent or leaner ground beef, tenderloin, sirloin. Round cuts of beef, chicken breast, turkey breast. All fish. Grill, bake, or broil your meat. Nothing should be fried.  Eat Less Often/Avoid: Fatty cuts of meat, turkey, or chicken leg, thigh, or wing. Fried cuts of meat or fish.  Dairy (2 to 3 servings)  Eat More   Often: Low-fat or fat-free milk, low-fat plain or light yogurt, reduced-fat or part-skim cheese.  Eat Less Often/Avoid: Milk (whole, 2%, skim, or chocolate). Whole milk yogurt. Full-fat cheeses.  Nuts, Seeds, and Legumes (4 to 5 servings per week)  Eat More Often: All without added salt.  Eat  Less Often/Avoid: Salted nuts and seeds, canned beans with added salt.  Fats and Sweets (limited)  Eat More Often: Vegetable oils, tub margarines without trans fats, sugar-free gelatin. Mayonnaise and salad dressings.  Eat Less Often/Avoid: Coconut oils, palm oils, butter, stick margarine, cream, half and half, cookies, candy, pie.   ________________________________________________________________________  Smoking Cessation Tips 1-800-QUIT-NOW  This document explains the best ways for you to quit smoking and new treatments to help. It lists new medicines that can double or triple your chances of quitting and quitting for good. It also considers ways to avoid relapses and concerns you may have about quitting, including weight gain.   Nicotine: A Powerful Addiction If you have tried to quit smoking, you know how hard it can be. It is hard because nicotine is a very addictive drug. For some people, it can be as addictive as heroin or cocaine. Usually, people make 2 or 3 tries, or more, before finally being able to quit. Each time you try to quit, you can learn about what helps and what hurts. Quitting takes hard work and a lot of effort, but you can quit smoking.   Quitting smoking is one of the most important things you will ever do You will live longer, feel better, and live better.  The impact on your body of quitting smoking is felt almost immediately:   Five keys to quitting: Studies have shown that these 5 steps will help you quit smoking and quit for good. You have the best chances of quitting if you use them together:   1. GET READY  Set a quit date.  Change your environment.  Get rid of ALL cigarettes, ashtrays, matches, and lighters in your home, car, and place of work.  Do not let people smoke in your home.  Review your past attempts to quit. Think about what worked and what did not.  Once you quit, do not smoke. NOT EVEN A PUFF!   2. GET SUPPORT AND ENCOURAGEMENT  Tell your  family, friends, and coworkers that you are going to quit and need their support. Ask them not to smoke around you.  Get individual, group, or telephone counseling and support.  Many smokers find one or more of the many self-help books available useful in helping them quit and stay off tobacco.   3. LEARN NEW SKILLS AND BEHAVIORS  Try to distract yourself from urges to smoke. Talk to someone, go for a walk, or occupy your time with a task.  When you first try to quit, change your routine. Take a different route to work. Drink tea instead of coffee. Eat breakfast in a different place.  Do something to reduce your stress. Take a hot bath, exercise, or read a book.  Plan something enjoyable to do every day. Reward yourself for not smoking.  Explore interactive web-based programs that specialize in helping you quit.   4. GET MEDICINE AND USE IT CORRECTLY .  Medicines can help you stop smoking and decrease the urge to smoke. Combining medicine with the above behavioral methods and support can quadruple your chances of successfully quitting smoking.  Talk with your doctor about these options.  5. BE PREPARED FOR RELAPSE   OR DIFFICULT SITUATIONS  Most relapses occur within the first 3 months after quitting. Do not be discouraged if you start smoking again. Remember, most people try several times before they finally quit.  You may have symptoms of withdrawal because your body is used to nicotine. You may crave cigarettes, be irritable, feel very hungry, cough often, get headaches, or have difficulty concentrating.  The withdrawal symptoms are only temporary. They are strongest when you first quit, but they will go away within 10 to 14 days.   Quitting takes hard work and a lot of effort, but you can quit smoking.   FOR MORE INFORMATION  Smokefree.gov (http://www.smokefree.gov) provides free, accurate, evidence-based information and professional assistance to help support the immediate and long-term  needs of people trying to quit smoking.  Document Released: 03/14/2001 Document Re-Released: 09/07/2009  Exit[employer] Patient Information 2011 [employer].    

## 2013-02-21 NOTE — Progress Notes (Signed)
  Subjective:    Patient ID: Jonathan [name], male    DOB: [birth year], 52 y.o.   MRN: [id number]  HPI  Jonathan [name] is a 52 yo man coming in to follow up HTN. Pt was found to be hypertensive during his initial intake 160s over 80s during last visit 01/17/13. All labs from last visit were reviewed with the patient.   Patient main concern is that he continues to smoke. He has previously quit in the past but then feels that social stressors such as job her history appears. He is down to 1/4ppd and continues to slowly decrease his intake. He is working on changing his diet and increasing exercise.   Patient family, social, personal medical, and surgical history along with medications was all reviewed with the patient.  Review of Systems  Otherwise negative unless listed in history of present illness    Objective:   Physical Exam  Filed Vitals:   02/21/13 1446  BP: 146/94  Pulse: 105  Temp: 98.6 F (37 C)    General: sitting in chair NAD HEENT: PERRL, EOMI, no scleral icterus Cardiac: RRR, no rubs, murmurs or gallops Pulm: clear to auscultation bilaterally, moving normal volumes of air Abd: soft, nontender, nondistended, BS present Ext: warm and well perfused, no pedal edema Neuro: alert and oriented X3, cranial nerves II-XII grossly intact     Assessment & Plan:  1. hypertension: Patient's blood pressure in the 160s over 80s during initial intake and after 30 minutes during the visit. His BP have otherwise been in pre-hypertensive stage. Cmet and CBC were normal.  BP Readings from Last 3 Encounters:  02/21/13 146/94  01/17/13 161/111  03/18/12 139/96   - pt after extensive discussion refused to start medication at this time and would like re-evaluation after 1 yr -Education in terms of smoking cessation was also reinforced with the patient -pt refused to have flu, tdap, or pneumo vaccinations  Pt discussed with Dr. Josem Kaufmann

## 2013-02-23 ENCOUNTER — Encounter: Payer: Self-pay | Admitting: Internal Medicine

## 2013-02-23 NOTE — Progress Notes (Signed)
Case discussed with Dr. Sadek soon after the resident saw the patient.  We reviewed the resident's history and exam and pertinent patient test results.  I agree with the assessment, diagnosis and plan of care documented in the resident's note. 

## 2013-06-03 ENCOUNTER — Other Ambulatory Visit: Payer: Self-pay | Admitting: *Deleted

## 2013-06-03 DIAGNOSIS — E785 Hyperlipidemia, unspecified: Secondary | ICD-10-CM

## 2013-06-04 ENCOUNTER — Other Ambulatory Visit: Payer: Self-pay | Admitting: *Deleted

## 2013-06-04 DIAGNOSIS — E785 Hyperlipidemia, unspecified: Secondary | ICD-10-CM

## 2013-06-04 MED ORDER — LOVASTATIN 40 MG PO TABS: 80.0000 mg | ORAL_TABLET | Freq: Every day | ORAL | Status: DC

## 2013-06-04 NOTE — Telephone Encounter (Signed)
Pt can not afford lovastatin 40 mg tabs.  They cost over $100 Please change to Lovastatin 20 mg - take 4 tabs at bedtime # 120  -  This was cost is Under $20 Please change in EPIC

## 2013-06-05 MED ORDER — LOVASTATIN 20 MG PO TABS: 80.0000 mg | ORAL_TABLET | Freq: Every day | ORAL | Status: DC

## 2013-06-05 NOTE — Addendum Note (Signed)
Addended by: Jerrye Noble on: 06/05/2013 02:11 PM   Modules accepted: Orders, Medications

## 2013-06-05 NOTE — Telephone Encounter (Signed)
Please read my note

## 2013-08-18 ENCOUNTER — Ambulatory Visit: Payer: No Typology Code available for payment source

## 2013-09-12 ENCOUNTER — Encounter: Payer: Self-pay | Admitting: Internal Medicine

## 2013-09-12 ENCOUNTER — Ambulatory Visit (INDEPENDENT_AMBULATORY_CARE_PROVIDER_SITE_OTHER): Payer: No Typology Code available for payment source | Admitting: Internal Medicine

## 2013-09-12 VITALS — BP 136/86 | HR 97 | Temp 98.1°F | Ht 65.0 in | Wt 132.2 lb

## 2013-09-12 DIAGNOSIS — I10 Essential (primary) hypertension: Secondary | ICD-10-CM

## 2013-09-12 DIAGNOSIS — J309 Allergic rhinitis, unspecified: Secondary | ICD-10-CM | POA: Insufficient documentation

## 2013-09-12 DIAGNOSIS — K219 Gastro-esophageal reflux disease without esophagitis: Secondary | ICD-10-CM | POA: Insufficient documentation

## 2013-09-12 NOTE — Patient Instructions (Signed)
For your stomach pain:   -take Tums, pepto bismol or Zantac (you can start out with 2 pills one in the morning and at night then can wean down to 1 pills)  For your sinuses:  -flonase for your nose take this everyday when you start it -zyrtec or claritin   General Instructions:   Thank you for bringing your medicines today. This helps [place] keep you safe from mistakes.   Progress Toward Treatment Goals:  No flowsheet data found.  Self Care Goals & Plans:  Self Care Goal 09/12/2013  Manage my medications take my medicines as prescribed; bring my medications to every visit; refill my medications on time  Eat healthy foods drink diet soda or water instead of juice or soda; eat more vegetables; eat foods that are low in salt; eat baked foods instead of fried foods; eat fruit for snacks and desserts  Stop smoking -  Other -    No flowsheet data found.   Care Management & Community Referrals:  No flowsheet data found.

## 2013-09-12 NOTE — Progress Notes (Signed)
   Subjective:    Patient ID: Jonathan [name], male    DOB: 01/11/1961, 53 y.o.   MRN: [id number]  HPI Jonathan [name] is a 53yo man pmh as listed below here for HTN check.   Pt had been HTN over a year ago but was resistant to medication and wanted to initiate and maintain some lifestyle changes. He states that since that time he has continued to make dietary and lifestyle changes. 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 Hypertension ROS: no TIA's, no chest pain on exertion, no dyspnea on exertion, no swelling of ankles, no orthostatic dizziness or lightheadedness, no palpitations and no intermittent claudication symptoms.    The patient is complaining of some clear postnasal drip is causing an irritating cough and intermittent ear fullness. The patient states that he does have slight allergic reactions to peaches but has been eating them anyway "I just love them and deal with the pain." The patient has not been taking any over-the-counter antihistamines for relief. The patient has also noticed some itchy watery eyes, but denied any shortness of breath or chest pain.  Abdominal pain has become another new concern for the patient for the last 2-3 months. Describes as a gnawing sensation in his epigastric area that is sometimes tender to palpation. It is worse at night and he wakes up with a sour taste in his mouth as well as sometimes clearing his throat he has also noticed that sometimes it causes some intermittent hoarseness that is usually resolved by the end of the day. He states given his new work schedule on restoring home he only eats about one large meal a day and that is anywhere between 10-15 minutes before going to bed. He did previously take over-the-counter antacids with relief but has not been doing that lately. He only intermittently tried Pepto-Bismol but that did not resolve his symptoms. He denied any  hematochezia, change in bowel habits, nausea vomiting, loss of appetite, fevers or chills.   Past Medical History  Diagnosis Date  . Arthritis     Bilateral knee arthritis for about 20 years. Not taking any medications, believes in nature processes   Current Outpatient Prescriptions on File Prior to Visit  Medication Sig Dispense Refill  . lovastatin (MEVACOR) 20 MG tablet Take 4 tablets (80 mg total) by mouth daily.  120 tablet  11   No current facility-administered medications on file prior to visit.   Social, surgical, family history reviewed with patient and updated in appropriate chart locations.   Review of Systems  Negative except what listed in HPI     Objective:   Physical Exam Filed Vitals:   09/12/13 1418  BP: 136/86  Pulse: 97  Temp: 98.1 F (36.7 C)   General: sitting in chair, NAD HEENT: PERRL, EOMI, no scleral icterus, clear PND, ears both packed with cerumen making hard to see TM, no pinnae tenderness, oropharynx MMM, poor dentition many missing teeth, some erythema posterior oropharynx Cardiac: RRR, no rubs, murmurs or gallops Pulm: clear to auscultation bilaterally, moving normal volumes of air Abd: soft, ttp over epigastrium, no hepatosplenomegaly, nondistended, BS present Ext: warm and well perfused, no pedal edema Neuro: alert and oriented X3, cranial nerves II-XII grossly intact    Assessment & Plan:  Please see problem oriented charting  Pt discussed with Dr. Ellwood Dense

## 2013-09-12 NOTE — Assessment & Plan Note (Signed)
Pt without alarm symptoms today but most likely epigastric pain is representative of acid reflux. Pt also describes that this exact pain was relieved several years ago with OTC antacids.  -recommend Zantac BID for 3-4 wks -f/u if doesn't resolve may need PPI -if alarm symptoms develop may need endoscopy -list of foods to avoid, smoking cessation was strongly encouraged

## 2013-09-12 NOTE — Assessment & Plan Note (Signed)
Pt symptoms likely represent an allergic process as he describes clear postnasal drip along with itchy watery eyes. Patient is also known allergies to peaches and will intermittently break out in itching or rash after eating it. The patient did not have any throat swelling or feeling that he was unable to breathe. -Over-the-counter Zyrtec, Flonase -Education in terms of avoidance of allergens

## 2013-09-12 NOTE — Assessment & Plan Note (Signed)
BP Readings from Last 3 Encounters:  09/12/13 136/86  02/21/13 146/94  01/17/13 161/111    Lab Results  Component Value Date   NA 137 01/17/2013   K 4.0 01/17/2013   CREATININE 1.03 01/17/2013    Assessment: Blood pressure control:  at goal Progress toward BP goal:    Comments: pt didn't want to be on medication and therefore made dramatic dietary changes, salt restriction, weight loss, and decreased smoking   Plan: Medications:  no medications needed at this time Educational resources provided:   Self management tools provided:   Other plans: will continue to follow

## 2013-09-15 NOTE — Progress Notes (Signed)
Case discussed with Dr. Sadek at the time of the visit.  We reviewed the resident's history and exam and pertinent patient test results.  I agree with the assessment, diagnosis, and plan of care documented in the resident's note. 

## 2013-09-26 ENCOUNTER — Encounter: Payer: Self-pay | Admitting: Internal Medicine

## 2014-01-09 ENCOUNTER — Ambulatory Visit (HOSPITAL_COMMUNITY)
Admission: RE | Admit: 2014-01-09 | Discharge: 2014-01-09 | Disposition: A | Discharge status: Home / Self Care | Payer: Self-pay | Source: Ambulatory Visit | Attending: Internal Medicine | Admitting: Internal Medicine

## 2014-01-09 ENCOUNTER — Ambulatory Visit (INDEPENDENT_AMBULATORY_CARE_PROVIDER_SITE_OTHER): Payer: Self-pay | Admitting: Internal Medicine

## 2014-01-09 ENCOUNTER — Encounter: Payer: Self-pay | Admitting: Internal Medicine

## 2014-01-09 VITALS — BP 143/95 | HR 110 | Temp 98.6°F | Ht 65.5 in | Wt 123.2 lb

## 2014-01-09 DIAGNOSIS — R05 Cough: Secondary | ICD-10-CM | POA: Insufficient documentation

## 2014-01-09 DIAGNOSIS — I10 Essential (primary) hypertension: Secondary | ICD-10-CM

## 2014-01-09 DIAGNOSIS — R634 Abnormal weight loss: Secondary | ICD-10-CM | POA: Insufficient documentation

## 2014-01-09 DIAGNOSIS — F17210 Nicotine dependence, cigarettes, uncomplicated: Secondary | ICD-10-CM | POA: Insufficient documentation

## 2014-01-09 DIAGNOSIS — E785 Hyperlipidemia, unspecified: Secondary | ICD-10-CM

## 2014-01-09 LAB — CBC WITH DIFFERENTIAL/PLATELET
BASOS ABS: 0.1 10*3/uL (ref 0.0–0.1)
Basophils Relative: 1 % (ref 0–1)
Eosinophils Absolute: 0.1 10*3/uL (ref 0.0–0.7)
Eosinophils Relative: 1 % (ref 0–5)
HCT: 45.3 % (ref 39.0–52.0)
Hemoglobin: 15.5 g/dL (ref 13.0–17.0)
LYMPHS ABS: 1.4 10*3/uL (ref 0.7–4.0)
LYMPHS PCT: 20 % (ref 12–46)
MCH: 31.8 pg (ref 26.0–34.0)
MCHC: 34.2 g/dL (ref 30.0–36.0)
MCV: 92.8 fL (ref 78.0–100.0)
MONO ABS: 0.9 10*3/uL (ref 0.1–1.0)
Monocytes Relative: 13 % — ABNORMAL HIGH (ref 3–12)
Neutro Abs: 4.5 10*3/uL (ref 1.7–7.7)
Neutrophils Relative %: 65 % (ref 43–77)
Platelets: 355 10*3/uL (ref 150–400)
RBC: 4.88 MIL/uL (ref 4.22–5.81)
RDW: 14.1 % (ref 11.5–15.5)
WBC: 6.9 10*3/uL (ref 4.0–10.5)

## 2014-01-09 NOTE — Progress Notes (Signed)
Subjective:   Patient ID: Jonathan [name] male   DOB: 1960/05/18 53 y.o.   MRN: [id number]  HPI: Jonathan [name] is a 53 y.o. man pmh as listed below presents for follow up on ongoing weight loss.   The patient states that in terms of his acid reflux is significantly improved since he's been taking daily over-the-counter antacids. Although he still has some epigastric non-pain that seems uncoordinated with his GERD symptoms. He continues to notice ongoing weight loss without trying. He has not had any changes in his diet and states that he's been smoking a half a joint every night increases appetite although his appetite is normal. He feels that he is normal volumes of 4 does not have early satiety and has a normal appetite. He has not noticed any jaundice. He has not had any nausea vomiting or diarrhea. He has not had any significant change in bowel habits with constipation versus diarrhea but has noticed some hematochezia but no bright red blood per rectum or bloodstained stool. The epigastric pain is localized to the right upper quadrant and epigastric area does not radiate anywhere and is now fluctuating with food. The patient states that he has been incarcerated for a period of 6 months that only happened once and has never been homeless he also recalls having been tested for TB that was negative at some point in history. He never donated blood, did IV drugs, or have high risk sexual behavior. The patient has been an avid smoker of half to one pack per day for the past 39 years. He has noticed a cough but no hemoptysis, no odynophagia or dysphagia, and no oral ulcers or lesions.    Past Medical History  Diagnosis Date  . Arthritis     Bilateral knee arthritis for about 20 years. Not taking any medications, believes in nature processes   Current Outpatient Prescriptions  Medication Sig Dispense Refill  . lovastatin (MEVACOR) 20 MG tablet Take 4 tablets (80 mg total) by mouth daily.  120 tablet  11     No current facility-administered medications for this visit.   Family History  Problem Relation Age of Onset  . Prostate cancer Maternal Uncle 66    On radiation therapy now, diagnosed in around June 2012  . Colon cancer Maternal Uncle   . Heart attack Paternal Grandfather     Diet due to MI  . Lung cancer Paternal Aunt   . Cancer Paternal Aunt    History   Social History  . Marital Status: Single    Spouse Name: N/A    Number of Children: N/A  . Years of Education: N/A   Social History Main Topics  . Smoking status: Current Every Day Smoker -- 0.50 packs/day    Types: Cigarettes  . Smokeless tobacco: Never Used     Comment: will think about it.  Cutting back  . Alcohol Use: 1.2 oz/week    2 Cans of beer per week  . Drug Use: 2.00 per week    Special: Marijuana     Comment: Active user.  Marland Kitchen Sexual Activity: Yes    Partners: Female     Comment: Lives with his fiance.   Other Topics Concern  . None   Social History Narrative   Lives in [place] with his fiance.   Moved to [place] 11 years before. Used to live in [place] for 20 years before that.   Has 4 healthy kids from previous wife- separated now.  Is not working full-time since last 2 and half years- since early 2010.   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   Review of Systems: Pertinent items are noted in HPI. Objective:  Physical Exam: Filed Vitals:   01/09/14 1422  BP: 143/95  Pulse: 110  Temp: 98.6 F (37 C)  TempSrc: Oral  Height: 5' 5.5" (1.664 m)  Weight: 123 lb 3.2 oz (55.883 kg)  SpO2: 96%   General: sitting in chair, NAD  HEENT: PERRL, MMM, no oral lesions or masses, missing several teeth, poor dentition, submandibular LAD bilaterally, no cervical LAD,EOMI, no scleral icterus, large amount of clear PND, erythematous nasal mucosa Cardiac: RRR, no rubs, murmurs or gallops Pulm: clear to auscultation bilaterally, no crackles wheezes or  rhonchi,moving normal volumes of air Abd: soft, no hepatosplenomegaly, palpable right upper quadrant round solid noncystic feeling mobile mass,nondistended, BS present, epigastric tenderness to palpation Ext: warm and well perfused, no pedal edema, no skin rashes or lesions Neuro: alert and oriented X3, cranial nerves II-XII grossly intact  Assessment & Plan:  Please see problem oriented charting  Pt discussed with Dr. Lynnae January

## 2014-01-09 NOTE — Assessment & Plan Note (Signed)
BP Readings from Last 3 Encounters:  01/09/14 143/95  09/12/13 136/86  02/21/13 146/94    Lab Results  Component Value Date   NA 137 01/17/2013   K 4.0 01/17/2013   CREATININE 1.03 01/17/2013    Assessment: Blood pressure control:   Progress toward BP goal:    Comments: patient slightly elevated today is still very reluctant on taking medications, he continues to maintain his aggressive lifestyle modifications  Plan: Medications:  No medications at this time Educational resources provided:   Self management tools provided:   Other plans: will followup in 2 weeks

## 2014-01-09 NOTE — Assessment & Plan Note (Signed)
Patient has been taking the statin and would like to see if he's made any improvement with lowering his LDL. -Fasting lipid panel done today

## 2014-01-09 NOTE — Assessment & Plan Note (Addendum)
Wt Readings from Last 3 Encounters:  01/09/14 123 lb 3.2 oz (55.883 kg)  09/12/13 132 lb 3.2 oz (59.966 kg)  02/21/13 139 lb 6.4 oz (63.231 kg)   The patient truly does have continual ongoing weight loss despite eating the patient's efforts by increasing his meal portions and smoking marijuana. He does have worrisome symptoms including hematochezia and previous colonoscopy done in 2013 with tubular adenoma that was nonmalignant. He also has an extensive 39-pack-year smoking history. Coupled with the possible palpable mass on exam that could represent a lipoma or other mass in the setting of significant weight loss. The patient does not seem to have any other localizing findings. Patient had a PSA in 2012 that was normal. He also has extensive secondary relatives with lung cancer. -CBC with differential, CMet, HIV, TSH, UA (checking for hematuria for possible bladder cancer in the setting of extensive smoking), hepatitis panel -CXR and abd [place] -Referral to GI for possible repeat colonoscopy in the setting of extensive weight loss previous tubular adenoma and hematochezia -Followup in 2 weeks with the patient to review results

## 2014-01-09 NOTE — Patient Instructions (Signed)
General Instructions:   Please bring your medicines with you each time you come to clinic.  Medicines may include prescription medications, over-the-counter medications, herbal remedies, eye drops, vitamins, or other pills.   Progress Toward Treatment Goals:  No flowsheet data found.  Self Care Goals & Plans:  Self Care Goal 01/09/2014  Manage my medications take my medicines as prescribed; bring my medications to every visit; refill my medications on time  Eat healthy foods drink diet soda or water instead of juice or soda; eat more vegetables; eat foods that are low in salt; eat baked foods instead of fried foods; eat fruit for snacks and desserts  Stop smoking -  Other -    No flowsheet data found.   Care Management & Community Referrals:  No flowsheet data found.

## 2014-01-10 LAB — COMPREHENSIVE METABOLIC PANEL
ALT: 11 U/L (ref 0–53)
AST: 25 U/L (ref 0–37)
Albumin: 4.4 g/dL (ref 3.5–5.2)
Alkaline Phosphatase: 63 U/L (ref 39–117)
BILIRUBIN TOTAL: 0.5 mg/dL (ref 0.2–1.2)
BUN: 6 mg/dL (ref 6–23)
CALCIUM: 9.8 mg/dL (ref 8.4–10.5)
CHLORIDE: 102 meq/L (ref 96–112)
CO2: 26 mEq/L (ref 19–32)
Creat: 0.98 mg/dL (ref 0.50–1.35)
GLUCOSE: 88 mg/dL (ref 70–99)
Potassium: 5.1 mEq/L (ref 3.5–5.3)
Sodium: 141 mEq/L (ref 135–145)
Total Protein: 7.6 g/dL (ref 6.0–8.3)

## 2014-01-10 LAB — URINALYSIS, ROUTINE W REFLEX MICROSCOPIC
Glucose, UA: NEGATIVE mg/dL
Hgb urine dipstick: NEGATIVE
LEUKOCYTES UA: NEGATIVE
Nitrite: NEGATIVE
PROTEIN: NEGATIVE mg/dL
SPECIFIC GRAVITY, URINE: 1.025 (ref 1.005–1.030)
UROBILINOGEN UA: 1 mg/dL (ref 0.0–1.0)
pH: 5.5 (ref 5.0–8.0)

## 2014-01-10 LAB — LIPID PANEL
Cholesterol: 151 mg/dL (ref 0–200)
HDL: 63 mg/dL (ref >39)
LDL Cholesterol: 78 mg/dL (ref 0–99)
TRIGLYCERIDES: 49 mg/dL (ref <150)
Total CHOL/HDL Ratio: 2.4 Ratio
VLDL: 10 mg/dL (ref 0–40)

## 2014-01-10 LAB — HEPATITIS PANEL, ACUTE
HCV Ab: NEGATIVE
HEP B S AG: NEGATIVE
Hep A IgM: NONREACTIVE
Hep B C IgM: NONREACTIVE

## 2014-01-10 LAB — TSH: TSH: 0.388 u[IU]/mL (ref 0.350–4.500)

## 2014-01-10 LAB — HIV ANTIBODY (ROUTINE TESTING W REFLEX): HIV 1&2 Ab, 4th Generation: NONREACTIVE

## 2014-01-12 NOTE — Progress Notes (Signed)
Internal Medicine Clinic Attending  Case discussed with Dr. Sadek soon after the resident saw the patient.  We reviewed the resident's history and exam and pertinent patient test results.  I agree with the assessment, diagnosis, and plan of care documented in the resident's note. 

## 2014-01-16 ENCOUNTER — Ambulatory Visit (HOSPITAL_COMMUNITY)
Admission: RE | Admit: 2014-01-16 | Discharge: 2014-01-16 | Disposition: A | Discharge status: Home / Self Care | Payer: Self-pay | Source: Ambulatory Visit | Attending: Internal Medicine | Admitting: Internal Medicine

## 2014-01-16 DIAGNOSIS — N281 Cyst of kidney, acquired: Secondary | ICD-10-CM | POA: Insufficient documentation

## 2014-01-16 DIAGNOSIS — R634 Abnormal weight loss: Secondary | ICD-10-CM | POA: Insufficient documentation

## 2014-01-30 ENCOUNTER — Encounter: Payer: Self-pay | Admitting: Internal Medicine

## 2014-01-30 ENCOUNTER — Ambulatory Visit (INDEPENDENT_AMBULATORY_CARE_PROVIDER_SITE_OTHER): Payer: Self-pay | Admitting: Internal Medicine

## 2014-01-30 VITALS — BP 133/92 | HR 92 | Temp 99.0°F | Ht 65.0 in | Wt 126.8 lb

## 2014-01-30 DIAGNOSIS — R634 Abnormal weight loss: Secondary | ICD-10-CM

## 2014-01-30 NOTE — Patient Instructions (Signed)
General Instructions: We are working on getting you in to see the GI doctor for your weight loss. All your results have been negative.   Thank you for bringing your medicines today. This helps [place] keep you safe from mistakes.   Progress Toward Treatment Goals:  No flowsheet data found.  Self Care Goals & Plans:  Self Care Goal 01/30/2014  Manage my medications take my medicines as prescribed; bring my medications to every visit; refill my medications on time  Eat healthy foods drink diet soda or water instead of juice or soda; eat more vegetables; eat foods that are low in salt; eat baked foods instead of fried foods; eat fruit for snacks and desserts  Stop smoking -  Other -    No flowsheet data found.   Care Management & Community Referrals:  No flowsheet data found.

## 2014-01-30 NOTE — Progress Notes (Signed)
Medicine attending: Medical history, presenting problems, physical findings, and medications, reviewed with resident physician Dr. Clinton Gallant and I concur with their evaluation and management plan. Middle-aged man with unexplained weight loss. Extensive evaluation so far unrevealing. We will make a referral to gastroenterology to look for occult malignancy. Murriel Hopper, M.D., [place]

## 2014-01-30 NOTE — Assessment & Plan Note (Signed)
Wt Readings from Last 3 Encounters:  01/30/14 126 lb 12.8 oz (57.516 kg)  01/09/14 123 lb 3.2 oz (55.883 kg)  09/12/13 132 lb 3.2 oz (59.966 kg)   Pt seems to have sustained weight at this time. Patient is up-to-date with all cancer screening at this point that was essentially negative. Other extensive weight loss workup has all essentially been unremarkable as well. These were shared with the patient. Smoking cessation was again highly encouraged. The patient seems to be at contemplation stage. Other etiologies including rheumatologic/autoimmune disease or malabsorption could be considered but the patient is not having any signs or symptoms to suggest this is etiology.  The patient still has red flag symptoms in terms of his extensive weight loss in the setting of acid reflux and previous tubular adenoma findings from his last colonoscopy. -Continue to refer patient to GI for repeat testing

## 2014-01-30 NOTE — Progress Notes (Signed)
Subjective:   Patient ID: Jonathan [name] male   DOB: Aug 01, 1960 53 y.o.   MRN: [id number]  HPI: [name] is a 53 y.o. man pmh as listed below presents for follow up on ongoing weight loss.   The patient states that he feels that his GERD has become under control and that he has continued to have good appetite and feels that he is eating better. The only thing the patient continues to smoke and is down to about half a pack per day. The patient has had no other symptoms including rash, joint pain, abdominal pain, chest pain, headache, shortness of breath, diarrhea, nausea/vomiting.  Extensive workup was completed during the patient's last visit and all labs and imaging results were shared with the patient.   Past Medical History  Diagnosis Date  . Arthritis     Bilateral knee arthritis for about 20 years. Not taking any medications, believes in nature processes   Current Outpatient Prescriptions  Medication Sig Dispense Refill  . ranitidine (ZANTAC) 150 MG tablet Take 150 mg by mouth at bedtime.      . lovastatin (MEVACOR) 20 MG tablet Take 4 tablets (80 mg total) by mouth daily.  120 tablet  11   No current facility-administered medications for this visit.   Family History  Problem Relation Age of Onset  . Prostate cancer Maternal Uncle 66    On radiation therapy now, diagnosed in around June 2012  . Colon cancer Maternal Uncle   . Heart attack Paternal Grandfather     Diet due to MI  . Lung cancer Paternal Aunt   . Cancer Paternal Aunt    History   Social History  . Marital Status: Single    Spouse Name: N/A    Number of Children: N/A  . Years of Education: N/A   Social History Main Topics  . Smoking status: Current Every Day Smoker -- 0.50 packs/day    Types: Cigarettes  . Smokeless tobacco: Never Used     Comment: will think about it.  Cutting back  . Alcohol Use: 1.2 oz/week    2 Cans of beer per week  . Drug Use: 2.00 per week    Special: Marijuana   Comment: Active user.  Marland Kitchen Sexual Activity: Yes    Partners: Female     Comment: Lives with his fiance.   Other Topics Concern  . None   Social History Narrative   Lives in [place] with his fiance.   Moved to [place] 11 years before. Used to live in [place] for 20 years before that.   Has 4 healthy kids from previous wife- separated now.   Is not working full-time since last 2 and half years- since early 2010.   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   Review of Systems: Pertinent items are noted in HPI. Objective:  Physical Exam: Filed Vitals:   01/30/14 1503  BP: 133/92  Pulse: 92  Temp: 99 F (37.2 C)  TempSrc: Oral  Height: 5\' 5"  (1.651 m)  Weight: 126 lb 12.8 oz (57.516 kg)  SpO2: 96%   General: sitting in chair, NAD  HEENT: PERRL, MMM, no oral lesions or masses, missing several teeth, poor dentition Cardiac: RRR, no rubs, murmurs or gallops Pulm: clear to auscultation bilaterally, no crackles wheezes or rhonchi,moving normal volumes of air Abd: soft, no hepatosplenomegaly, nondistended, BS present, epigastric tenderness to palpation Ext: warm and well perfused, no pedal  edema, no skin rashes or lesions Neuro: alert and oriented X3, cranial nerves II-XII grossly intact  Assessment & Plan:  Please see problem oriented charting  Pt discussed with Dr. Beryle Beams

## 2014-02-04 ENCOUNTER — Encounter: Payer: Self-pay | Admitting: Internal Medicine

## 2014-02-16 ENCOUNTER — Ambulatory Visit: Payer: Self-pay

## 2014-03-25 ENCOUNTER — Encounter: Payer: Self-pay | Admitting: Gastroenterology

## 2014-04-07 ENCOUNTER — Encounter: Payer: Self-pay | Admitting: Internal Medicine

## 2014-04-07 ENCOUNTER — Ambulatory Visit (INDEPENDENT_AMBULATORY_CARE_PROVIDER_SITE_OTHER): Payer: Self-pay | Admitting: Internal Medicine

## 2014-04-07 ENCOUNTER — Other Ambulatory Visit (INDEPENDENT_AMBULATORY_CARE_PROVIDER_SITE_OTHER): Payer: Self-pay

## 2014-04-07 VITALS — BP 132/74 | HR 100 | Ht 64.0 in | Wt 133.5 lb

## 2014-04-07 DIAGNOSIS — R634 Abnormal weight loss: Secondary | ICD-10-CM

## 2014-04-07 DIAGNOSIS — K219 Gastro-esophageal reflux disease without esophagitis: Secondary | ICD-10-CM

## 2014-04-07 DIAGNOSIS — R760 Raised antibody titer: Secondary | ICD-10-CM

## 2014-04-07 DIAGNOSIS — R768 Other specified abnormal immunological findings in serum: Secondary | ICD-10-CM

## 2014-04-07 DIAGNOSIS — Z86010 Personal history of colonic polyps: Secondary | ICD-10-CM

## 2014-04-07 LAB — H. PYLORI ANTIBODY, IGG: H PYLORI IGG: POSITIVE — AB

## 2014-04-07 NOTE — Patient Instructions (Signed)
Continue Zantac daily.   Your physician has requested that you go to the basement for the following lab work before leaving today: H. Pylori antibody.  Please follow up with Dr. Hilarie Fredrickson in 3 months but contact us sooner if weight declines before follow up.

## 2014-04-07 NOTE — Progress Notes (Signed)
Patient ID: Jonathan [name], male   DOB: Aug 04, 1960, 54 y.o.   MRN: [id number] HPI: [name] is a 54 yo male with PMH of adenomatous colon polyps, GERD, hypertension, hyperlipidemia and tobacco use who is seen to evaluate weight loss. He is here alone today. He is known to me from screening colonoscopy which was performed in December 2013. This revealed a 3 mm polyp in the ascending colon, a 7 mm polyp in the transverse colon, and a pedunculated 20 mm polyp in the sigmoid colon. Pathology results revealed tubular adenoma and benign submucosal leiomyoma. He also had moderate internal hemorrhoids and mild sigmoid diverticulosis. In October he was seen by primary care to evaluate epigastric type abdominal pain along with weight loss. He reported an approximate 20 pound weight loss over approximately a 12 month period. He weighed 123 pounds in October after having weighed 132 pounds in June 2015. He reports normal for him is between 135 and 145 LB.  He was having epigastric pain with some heartburn. He was prescribed Zantac which he has been using 150 mg in the morning. He reports this resolved his abdominal pain and his appetite has improved. Since October he has been able to gain 10 pounds. He is no longer having abdominal pain or heartburn. No dysphagia or odynophagia. Normal bowel movements without blood in his stool or melena. Denies diarrhea or constipation. He is working daily without trouble and he denies fatigue and malaise. Denies night sweats. Maternal uncle with colon cancer but no other GI tract malignancy in his family. He does continue to smoke tobacco and occasionally drinks beer. He denies daily alcohol or excessive alcohol intake on days that he drinks.  Past Medical History  Diagnosis Date  . Arthritis     Bilateral knee arthritis for about 20 years. Not taking any medications, believes in nature processes  . HLD (hyperlipidemia)   . HTN (hypertension)   . GERD (gastroesophageal reflux  disease)   . Allergic rhinitis   . Colon polyp     Past Surgical History  Procedure Laterality Date  . Colonoscopy  03/18/2012    Outpatient Prescriptions Prior to Visit  Medication Sig Dispense Refill  . lovastatin (MEVACOR) 20 MG tablet Take 4 tablets (80 mg total) by mouth daily. 120 tablet 11  . ranitidine (ZANTAC) 150 MG tablet Take 150 mg by mouth at bedtime. Pt takes OTC brand     No facility-administered medications prior to visit.    No Known Allergies  Family History  Problem Relation Age of Onset  . Prostate cancer Maternal Uncle 66    On radiation therapy now, diagnosed in around June 2012  . Colon cancer Maternal Uncle   . Heart attack Paternal Grandfather     Diet due to MI  . Lung cancer Paternal Aunt   . Brain cancer Paternal Aunt   . Colon polyps Neg Hx   . Diabetes Neg Hx   . Kidney disease Maternal Uncle   . Gallbladder disease Neg Hx   . Esophageal cancer Neg Hx     History  Substance Use Topics  . Smoking status: Current Every Day Smoker -- 0.50 packs/day for 30 years    Types: Cigarettes  . Smokeless tobacco: Never Used     Comment: will think about it.  Cutting back  . Alcohol Use: 1.2 oz/week    2 Cans of beer per week     Comment: Occassionally    ROS: As per history of present  illness, otherwise negative  BP 132/74 mmHg  Pulse 100  Ht 5\' 4"  (1.626 m)  Wt 133 lb 8 oz (60.555 kg)  BMI 22.90 kg/m2 Constitutional: Well-developed and well-nourished. No distress. HEENT: Normocephalic and atraumatic. Oropharynx is clear and moist. No oropharyngeal exudate. Conjunctivae are normal.  No scleral icterus. Neck: Neck supple. Trachea midline. Cardiovascular: Normal rate, regular rhythm and intact distal pulses. No M/R/G Pulmonary/chest: Effort normal and breath sounds normal. No wheezing, rales or rhonchi. Abdominal: Soft, nontender, nondistended. Bowel sounds active throughout. There are no masses palpable. No hepatosplenomegaly. Extremities:  no clubbing, cyanosis, or edema Lymphadenopathy: No cervical adenopathy noted. Neurological: Alert and oriented to person place and time. Skin: Skin is warm and dry. No rashes noted. Psychiatric: Normal mood and affect. Behavior is normal.  RELEVANT LABS AND IMAGING: CBC    Component Value Date/Time   WBC 6.9 01/09/2014 1534   RBC 4.88 01/09/2014 1534   HGB 15.5 01/09/2014 1534   HCT 45.3 01/09/2014 1534   PLT 355 01/09/2014 1534   MCV 92.8 01/09/2014 1534   MCH 31.8 01/09/2014 1534   MCHC 34.2 01/09/2014 1534   RDW 14.1 01/09/2014 1534   LYMPHSABS 1.4 01/09/2014 1534   MONOABS 0.9 01/09/2014 1534   EOSABS 0.1 01/09/2014 1534   BASOSABS 0.1 01/09/2014 1534    CMP     Component Value Date/Time   NA 141 01/09/2014 1534   K 5.1 01/09/2014 1534   CL 102 01/09/2014 1534   CO2 26 01/09/2014 1534   GLUCOSE 88 01/09/2014 1534   BUN 6 01/09/2014 1534   CREATININE 0.98 01/09/2014 1534   CALCIUM 9.8 01/09/2014 1534   PROT 7.6 01/09/2014 1534   ALBUMIN 4.4 01/09/2014 1534   AST 25 01/09/2014 1534   ALT 11 01/09/2014 1534   ALKPHOS 63 01/09/2014 1534   BILITOT 0.5 01/09/2014 1534   GFRNONAA 82 03/06/2011 0853   GFRAA >89 03/06/2011 0853   CLINICAL DATA:  Smoker with 20 lb weight loss over the past year. Cough and sinus drainage.   EXAM: CHEST  2 VIEW   COMPARISON:  None.   FINDINGS: The cardiac silhouette, mediastinal and hilar contours are normal. The lungs are clear. No pleural effusion. The bony thorax is intact.   IMPRESSION: Normal chest x-ray.     Electronically Signed   By: Kalman Jewels M.D.   On: 01/09/2014 16:06 EXAM: ULTRASOUND ABDOMEN COMPLETE   COMPARISON:  None.   FINDINGS: Gallbladder: No gallstones or wall thickening visualized. There is no pericholecystic fluid. No sonographic Murphy sign noted.   Common bile duct: Diameter: 3 mm. There is no intrahepatic, common hepatic, or common bile duct dilatation.   Liver: No focal lesion  identified. Within normal limits in parenchymal echogenicity.   IVC: No abnormality visualized.   Pancreas: No mass or inflammatory focus.   Spleen: Size and appearance within normal limits.   Right Kidney: Length: 11.4 cm. Echogenicity within normal limits. No hydronephrosis visualized. There is an upper pole right renal cyst measuring 0.8 x 0.5 x 1.0 cm. No other renal mass seen.   Left Kidney: Length: 10.5 cm. Echogenicity within normal limits. No mass or hydronephrosis visualized.   Abdominal aorta: No aneurysm visualized.   Other findings: No demonstrable ascites.   IMPRESSION: Small upper pole right renal cyst.  Study otherwise unremarkable.     Electronically Signed   By: Lowella Grip M.D.   On: 01/16/2014 09:14  ASSESSMENT/PLAN:  54 yo male with PMH of adenomatous  colon polyps, GERD, hypertension, hyperlipidemia and tobacco use who is seen to evaluate weight loss.  1. Weight loss and now resolved GERD/epigastric pain -- he has been able to eat full meals and denies early satiety after beginning Zantac 150 mg each day. He has gained 10 pounds since October which is encouraging. We will check H. pylori antibody and treat if positive. I would like to see him back in 3 months to ensure no further weight decrease. If he continues to lose weight her pain returns my recommendation be to repeat colonoscopy given history of polyps and perform upper endoscopy. He understands this recommendation and plans to follow-up in 3 months time. He can continue Zantac 150 mg each morning and use a second dose in the evening if necessary. He is happy with this plan  2. History of adenomatous colon polyp -- if weight returns to normal and he remains asymptomatic, repeat surveillance colonoscopy recommended December 2016  Addendum: H. pylori serum antibody positive -- will treat with twice daily PPI plus Prevpac or Pylera. Recommend H. pylori stool antigen off PPI in 8 weeks to document  eradication Follow-up in 3 months as previous he recommended

## 2014-04-09 ENCOUNTER — Other Ambulatory Visit: Payer: Self-pay

## 2014-04-09 ENCOUNTER — Telehealth: Payer: Self-pay | Admitting: Internal Medicine

## 2014-04-09 DIAGNOSIS — A048 Other specified bacterial intestinal infections: Secondary | ICD-10-CM

## 2014-04-09 MED ORDER — BIS SUBCIT-METRONID-TETRACYC 140-125-125 MG PO CAPS: 3.0000 | ORAL_CAPSULE | Freq: Three times a day (TID) | ORAL | Status: DC

## 2014-04-09 MED ORDER — OMEPRAZOLE 20 MG PO CPDR: 20.0000 mg | DELAYED_RELEASE_CAPSULE | Freq: Two times a day (BID) | ORAL | Status: DC

## 2014-04-09 NOTE — Telephone Encounter (Signed)
Dr. Hilarie Fredrickson pt cannot afford pylera or prevpac. Pt has the hospital orange card. Please advise.

## 2014-04-12 NOTE — Telephone Encounter (Signed)
Can we break apart Prevpak with amoxicillin 1000 mg BID and clarithromycin 500 mg BID x 10 days + BID PPI If not then we can try to contact drug rep for Pylera for assistance

## 2014-04-13 ENCOUNTER — Other Ambulatory Visit: Payer: Self-pay

## 2014-04-13 MED ORDER — CLARITHROMYCIN 500 MG PO TABS: 500.0000 mg | ORAL_TABLET | Freq: Two times a day (BID) | ORAL | Status: DC

## 2014-04-13 MED ORDER — AMOXICILLIN 500 MG PO CAPS: 1000.0000 mg | ORAL_CAPSULE | Freq: Two times a day (BID) | ORAL | Status: DC

## 2014-04-13 NOTE — Telephone Encounter (Signed)
Spoke with pt and he is aware, scripts sent to pharmacy. 

## 2014-06-08 ENCOUNTER — Telehealth: Payer: Self-pay

## 2014-06-08 NOTE — Telephone Encounter (Signed)
Pt aware.

## 2014-06-08 NOTE — Telephone Encounter (Signed)
-----   Message from Maury Dus, RN sent at 04/09/2014 11:22 AM EST ----- Regarding: Lab Pt needs retest for hpylori, order in epic.

## 2014-06-15 ENCOUNTER — Other Ambulatory Visit: Payer: Self-pay

## 2014-06-15 DIAGNOSIS — R1013 Epigastric pain: Secondary | ICD-10-CM

## 2014-06-15 DIAGNOSIS — A048 Other specified bacterial intestinal infections: Secondary | ICD-10-CM

## 2014-06-16 ENCOUNTER — Other Ambulatory Visit: Payer: Self-pay

## 2014-06-16 DIAGNOSIS — R1013 Epigastric pain: Secondary | ICD-10-CM

## 2014-06-18 LAB — HELICOBACTER PYLORI  SPECIAL ANTIGEN: H. PYLORI Antigen: NEGATIVE

## 2014-07-26 ENCOUNTER — Other Ambulatory Visit: Payer: Self-pay | Admitting: Internal Medicine

## 2014-08-12 ENCOUNTER — Telehealth: Payer: Self-pay | Admitting: Internal Medicine

## 2014-08-12 ENCOUNTER — Encounter: Payer: Self-pay | Admitting: *Deleted

## 2014-08-12 NOTE — Telephone Encounter (Signed)
Call to patient to confirm appointment for 08/13/14 at 3:30 lmtcb

## 2014-08-13 ENCOUNTER — Ambulatory Visit: Payer: Self-pay

## 2014-08-13 ENCOUNTER — Telehealth: Payer: Self-pay | Admitting: *Deleted

## 2014-08-13 NOTE — Telephone Encounter (Signed)
Pt walked in to clinic - past 3 months pain in upper right back area and tingling right arm. Needs last appt of day - appt made 08/17/14 3:45PM Dr Algis Liming. Hilda Blades Carsin Randazzo RN 08/13/14 4PM

## 2014-08-17 ENCOUNTER — Encounter: Payer: Self-pay | Admitting: Internal Medicine

## 2014-08-17 ENCOUNTER — Ambulatory Visit (INDEPENDENT_AMBULATORY_CARE_PROVIDER_SITE_OTHER): Payer: Self-pay | Admitting: Internal Medicine

## 2014-08-17 VITALS — BP 134/81 | HR 95 | Temp 98.2°F | Ht 65.0 in | Wt 133.4 lb

## 2014-08-17 DIAGNOSIS — M7581 Other shoulder lesions, right shoulder: Secondary | ICD-10-CM

## 2014-08-17 MED ORDER — DICLOFENAC SODIUM 1 % TD GEL: 2.0000 g | Freq: Four times a day (QID) | TRANSDERMAL | Status: DC

## 2014-08-17 NOTE — Patient Instructions (Signed)
General Instructions:   Please bring your medicines with you each time you come to clinic.  Medicines may include prescription medications, over-the-counter medications, herbal remedies, eye drops, vitamins, or other pills.   Progress Toward Treatment Goals:  No flowsheet data found.  Self Care Goals & Plans:  Self Care Goal 01/30/2014  Manage my medications take my medicines as prescribed; bring my medications to every visit; refill my medications on time  Eat healthy foods drink diet soda or water instead of juice or soda; eat more vegetables; eat foods that are low in salt; eat baked foods instead of fried foods; eat fruit for snacks and desserts  Stop smoking -  Other -    No flowsheet data found.   Care Management & Community Referrals:  No flowsheet data found.     Rotator Cuff Tendinitis  Rotator cuff tendinitis is inflammation of the tough, cord-like bands that connect muscle to bone (tendons) in your rotator cuff. Your rotator cuff is the collection of all the muscles and tendons that connect your arm to your shoulder. Your rotator cuff holds the head of your upper arm bone (humerus) in the cup (fossa) of your shoulder blade (scapula). CAUSES Rotator cuff tendinitis is usually caused by overusing the joint involved.  SIGNS AND SYMPTOMS  Deep ache in the shoulder also felt on the outside upper arm over the shoulder muscle.  Point tenderness over the area that is injured.  Pain comes on gradually and becomes worse with lifting the arm to the side (abduction) or turning it inward (internal rotation).  May lead to a chronic tear: When a rotator cuff tendon becomes inflamed, it runs the risk of losing its blood supply, causing some tendon fibers to die. This increases the risk that the tendon can fray and partially or completely tear. DIAGNOSIS Rotator cuff tendinitis is diagnosed by taking a medical history, performing a physical exam, and reviewing results of imaging  exams. The medical history is useful to help determine the type of rotator cuff injury. The physical exam will include looking at the injured shoulder, feeling the injured area, and watching you do range-of-motion exercises. X-ray exams are typically done to rule out other causes of shoulder pain, such as fractures. MRI is the imaging exam usually used for significant shoulder injuries. Sometimes a dye study called CT arthrogram is done, but it is not as widely used as MRI. In some institutions, special ultrasound tests may also be used to aid in the diagnosis. TREATMENT  Less Severe Cases  Use of a sling to rest the shoulder for a short period of time. Prolonged use of the sling can cause stiffness, weakness, and loss of motion of the shoulder joint.  Anti-inflammatory medicines, such as ibuprofen or naproxen sodium, may be prescribed. More Severe Cases  Physical therapy.  Use of steroid injections into the shoulder joint.  Surgery. HOME CARE INSTRUCTIONS   Use a sling or splint until the pain decreases. Prolonged use of the sling can cause stiffness, weakness, and loss of motion of the shoulder joint.  Apply ice to the injured area:  Put ice in a plastic bag.  Place a towel between your skin and the bag.  Leave the ice on for 20 minutes, 2-3 times a day.  Try to avoid use other than gentle range of motion while your shoulder is painful. Use the shoulder and exercise only as directed by your health care provider. Stop exercises or range of motion if pain or discomfort  increases, unless directed otherwise by your health care provider.  Only take over-the-counter or prescription medicines for pain, discomfort, or fever as directed by your health care provider.  If you were given a shoulder sling and straps (immobilizer), do not remove it except as directed, or until you see a health care provider for a follow-up exam. If you need to remove it, move your arm as little as possible or as  directed.  You may want to sleep on several pillows at night to lessen swelling and pain. SEEK IMMEDIATE MEDICAL CARE IF:   Your shoulder pain increases or new pain develops in your arm, hand, or fingers and is not relieved with medicines.  You have new, unexplained symptoms, especially increased numbness in the hands or loss of strength.  You develop any worsening of the problems that brought you in for care.  Your arm, hand, or fingers are numb or tingling.  Your arm, hand, or fingers are swollen, painful, or turn white or blue. MAKE SURE YOU:  Understand these instructions.  Will watch your condition.  Will get help right away if you are not doing well or get worse. Document Released: 06/10/2003 Document Revised: 01/08/2013 Document Reviewed: 10/30/2012 [facility] Patient Information 2015 [place], [place]. This information is not intended to replace advice given to you by your health care provider. Make sure you discuss any questions you have with your health care provider. Rotator Cuff Tendinitis  Rotator cuff tendinitis is inflammation of the tough, cord-like bands that connect muscle to bone (tendons) in your rotator cuff. Your rotator cuff is the collection of all the muscles and tendons that connect your arm to your shoulder. Your rotator cuff holds the head of your upper arm bone (humerus) in the cup (fossa) of your shoulder blade (scapula). CAUSES Rotator cuff tendinitis is usually caused by overusing the joint involved.  SIGNS AND SYMPTOMS  Deep ache in the shoulder also felt on the outside upper arm over the shoulder muscle.  Point tenderness over the area that is injured.  Pain comes on gradually and becomes worse with lifting the arm to the side (abduction) or turning it inward (internal rotation).  May lead to a chronic tear: When a rotator cuff tendon becomes inflamed, it runs the risk of losing its blood supply, causing some tendon fibers to die. This increases the  risk that the tendon can fray and partially or completely tear. DIAGNOSIS Rotator cuff tendinitis is diagnosed by taking a medical history, performing a physical exam, and reviewing results of imaging exams. The medical history is useful to help determine the type of rotator cuff injury. The physical exam will include looking at the injured shoulder, feeling the injured area, and watching you do range-of-motion exercises. X-ray exams are typically done to rule out other causes of shoulder pain, such as fractures. MRI is the imaging exam usually used for significant shoulder injuries. Sometimes a dye study called CT arthrogram is done, but it is not as widely used as MRI. In some institutions, special ultrasound tests may also be used to aid in the diagnosis. TREATMENT  Less Severe Cases  Use of a sling to rest the shoulder for a short period of time. Prolonged use of the sling can cause stiffness, weakness, and loss of motion of the shoulder joint.  Anti-inflammatory medicines, such as ibuprofen or naproxen sodium, may be prescribed. More Severe Cases  Physical therapy.  Use of steroid injections into the shoulder joint.  Surgery. HOME CARE INSTRUCTIONS  Use a sling or splint until the pain decreases. Prolonged use of the sling can cause stiffness, weakness, and loss of motion of the shoulder joint.  Apply ice to the injured area:  Put ice in a plastic bag.  Place a towel between your skin and the bag.  Leave the ice on for 20 minutes, 2-3 times a day.  Try to avoid use other than gentle range of motion while your shoulder is painful. Use the shoulder and exercise only as directed by your health care provider. Stop exercises or range of motion if pain or discomfort increases, unless directed otherwise by your health care provider.  Only take over-the-counter or prescription medicines for pain, discomfort, or fever as directed by your health care provider.  If you were given a  shoulder sling and straps (immobilizer), do not remove it except as directed, or until you see a health care provider for a follow-up exam. If you need to remove it, move your arm as little as possible or as directed.  You may want to sleep on several pillows at night to lessen swelling and pain. SEEK IMMEDIATE MEDICAL CARE IF:   Your shoulder pain increases or new pain develops in your arm, hand, or fingers and is not relieved with medicines.  You have new, unexplained symptoms, especially increased numbness in the hands or loss of strength.  You develop any worsening of the problems that brought you in for care.  Your arm, hand, or fingers are numb or tingling.  Your arm, hand, or fingers are swollen, painful, or turn white or blue. MAKE SURE YOU:  Understand these instructions.  Will watch your condition.  Will get help right away if you are not doing well or get worse. Document Released: 06/10/2003 Document Revised: 01/08/2013 Document Reviewed: 10/30/2012 [facility] Patient Information 2015 [place], [place]. This information is not intended to replace advice given to you by your health care provider. Make sure you discuss any questions you have with your health care provider.

## 2014-08-18 DIAGNOSIS — M5410 Radiculopathy, site unspecified: Secondary | ICD-10-CM | POA: Insufficient documentation

## 2014-08-18 DIAGNOSIS — M5412 Radiculopathy, cervical region: Secondary | ICD-10-CM | POA: Insufficient documentation

## 2014-08-18 NOTE — Assessment & Plan Note (Addendum)
Given findings of patient's physical exam most likely rotator cuff tendinitis and not tear or joint pathology. Treatment has included rest, ice, NSAIDs. Patient refused prescription medication or referral to physical therapy. In terms of his right hand numbness there was none seen on physical exam but recommended wearing a wrist brace to prevent carpal tunnel. No need for imaging at this time.  -use voltaren gel  -f/u if fails to improve

## 2014-08-18 NOTE — Progress Notes (Signed)
Internal Medicine Clinic Attending  Case discussed with Dr. Sadek at the time of the visit.  We reviewed the resident's history and exam and pertinent patient test results.  I agree with the assessment, diagnosis, and plan of care documented in the resident's note.  

## 2014-08-18 NOTE — Progress Notes (Signed)
Subjective:   Patient ID: Jonathan [name] male   DOB: [birth year] 54 y.o.   MRN: [id number]  HPI: [name] is a 54 y.o. man pmh as listed below presents for shoulder pain.    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 It has involved a lot of painting and drilling and overhead ceiling work. Since that time he has noticed some right shoulder pain and some numbness in his right hand while using the drill. He denies any trauma to the area, any neck pain, any back pain, any weakness, or dropping anything from his hands. He has not had previous surgeries or injury to any of his shoulders. He has tried some icy hot and massage with minimal relief.  Past Medical History  Diagnosis Date  . Arthritis     Bilateral knee arthritis for about 20 years. Not taking any medications, believes in nature processes  . HLD (hyperlipidemia)   . HTN (hypertension)   . GERD (gastroesophageal reflux disease)   . Allergic rhinitis   . Colon polyp    Current Outpatient Prescriptions  Medication Sig Dispense Refill  . amoxicillin (AMOXIL) 500 MG capsule Take 2 capsules (1,000 mg total) by mouth 2 (two) times daily. 40 capsule 0  . bismuth-metronidazole-tetracycline (PYLERA) 140-125-125 MG per capsule Take 3 capsules by mouth 4 (four) times daily -  before meals and at bedtime. 120 capsule 0  . diclofenac sodium (VOLTAREN) 1 % GEL Apply 2 g topically 4 (four) times daily. 100 g 2  . lovastatin (MEVACOR) 20 MG tablet TAKE FOUR TABLETS BY MOUTH ONCE DAILY 120 tablet 0  . omeprazole (PRILOSEC) 20 MG capsule Take 1 capsule (20 mg total) by mouth 2 (two) times daily before a meal. 20 capsule 0  . ranitidine (ZANTAC) 150 MG tablet Take 150 mg by mouth at bedtime. Pt takes OTC brand     No current facility-administered medications for this visit.   Family History  Problem Relation Age of Onset  . Prostate cancer Maternal Uncle 66    On radiation therapy now,  diagnosed in around June 2012  . Colon cancer Maternal Uncle   . Heart attack Paternal Grandfather     Diet due to MI  . Lung cancer Paternal Aunt   . Brain cancer Paternal Aunt   . Colon polyps Neg Hx   . Diabetes Neg Hx   . Kidney disease Maternal Uncle   . Gallbladder disease Neg Hx   . Esophageal cancer Neg Hx    History   Social History  . Marital Status: Single    Spouse Name: N/A  . Number of Children: 4  . Years of Education: N/A   Occupational History  . 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 Smoking status: Current Every Day Smoker -- 0.50 packs/day for 30 years    Types: Cigarettes  . Smokeless tobacco: Never Used     Comment: will think about it.  Cutting back  . Alcohol Use: 1.2 oz/week    2 Cans of beer per week     Comment: Occassionally  . Drug Use: 2.00 per week    Special: Marijuana     Comment: Active user.  Marland Kitchen Sexual Activity:    Partners: Female     Comment: Lives with his fiance.   Other Topics Concern  . None   Social History Narrative   Lives in [place] with  his fiance.   Moved to [place] 11 years before. Used to live in [place] for 20 years before that.   Has 4 healthy kids from previous wife- separated now.   Is not working full-time since last 2 and half years- since early 2010.   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   Review of Systems: Pertinent items are noted in HPI. Objective:  Physical Exam: Filed Vitals:   08/17/14 1635  BP: 134/81  Pulse: 95  Temp: 98.2 F (36.8 C)  TempSrc: Oral  Height: 5\' 5"  (1.651 m)  Weight: 133 lb 6.4 oz (60.51 kg)  SpO2: 98%   General: sitting in chair, NAD Cardiac: RRR, no rubs, murmurs or gallops Pulm: clear to auscultation bilaterally, moving normal volumes of air Abd: soft, nontender, nondistended, BS present Ext: warm and well perfused, no pedal edema Shoulder exam: no swelling, tenderness, instability; ligaments  intact, FROM shoulder joint, subdeltoid tenderness, +beer can test, some pain with lift off test, negative Neer's and Hawkin's test, ipsilateral elbow, wrist and hand exam is normal, contralateral shoulder exam is normal.   Assessment & Plan:  Please see problem oriented charting  Pt discussed with Dr. Daryll Drown

## 2014-09-27 ENCOUNTER — Other Ambulatory Visit: Payer: Self-pay | Admitting: Internal Medicine

## 2014-10-12 ENCOUNTER — Encounter: Payer: Self-pay | Admitting: Internal Medicine

## 2014-10-12 ENCOUNTER — Ambulatory Visit (INDEPENDENT_AMBULATORY_CARE_PROVIDER_SITE_OTHER): Payer: Self-pay | Admitting: Internal Medicine

## 2014-10-12 VITALS — BP 122/83 | HR 107 | Temp 98.0°F | Ht 65.0 in | Wt 136.2 lb

## 2014-10-12 DIAGNOSIS — M5410 Radiculopathy, site unspecified: Secondary | ICD-10-CM

## 2014-10-12 DIAGNOSIS — E785 Hyperlipidemia, unspecified: Secondary | ICD-10-CM

## 2014-10-12 NOTE — Assessment & Plan Note (Signed)
Pt's most recent lipid panel indicated a 10-year CHD risk of 4%. Jonathan [name] preferred being off of his statin so we discontinued the lovastatin and will re-check his lipids in 3 months.

## 2014-10-12 NOTE — Patient Instructions (Signed)
Please return if your right arm becomes weak or the tingling worsens as this could be serious nerve injury in your neck.  We stopped your statin and will re-check your cholesterol levels in 3 months.

## 2014-10-12 NOTE — Progress Notes (Signed)
Patient ID: Jonathan [name], male   DOB: 08-22-1960, 54 y.o.   MRN: [id number]   Subjective:   Patient ID: Jonathan [name] male   DOB: 1960/08/30 54 y.o.   MRN: [id number]  HPI: Jonathan [name] is here for follow-up for his right shoulder pain. Please see the A&P for the status of the pt's chronic medical problems.   Past Medical History  Diagnosis Date  . Arthritis     Bilateral knee arthritis for about 20 years. Not taking any medications, believes in nature processes  . HLD (hyperlipidemia)   . HTN (hypertension)   . GERD (gastroesophageal reflux disease)   . Allergic rhinitis   . Colon polyp    Current Outpatient Prescriptions  Medication Sig Dispense Refill  . diclofenac sodium (VOLTAREN) 1 % GEL Apply 2 g topically 4 (four) times daily. 100 g 2  . ranitidine (ZANTAC) 150 MG tablet Take 150 mg by mouth at bedtime. Pt takes OTC brand     No current facility-administered medications for this visit.   Family History  Problem Relation Age of Onset  . Prostate cancer Maternal Uncle 66    On radiation therapy now, diagnosed in around June 2012  . Colon cancer Maternal Uncle   . Heart attack Paternal Grandfather     Diet due to MI  . Lung cancer Paternal Aunt   . Brain cancer Paternal Aunt   . Colon polyps Neg Hx   . Diabetes Neg Hx   . Kidney disease Maternal Uncle   . Gallbladder disease Neg Hx   . Esophageal cancer Neg Hx    History   Social History  . Marital Status: Single    Spouse Name: N/A  . Number of Children: 4  . Years of Education: N/A   Occupational History  . 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 Smoking status: Current Every Day Smoker -- 0.50 packs/day for 30 years    Types: Cigarettes  . Smokeless tobacco: Never Used     Comment: will think about it.  Cutting back 5 cigs per day  . Alcohol Use: 1.2 oz/week    2 Cans of beer per week     Comment: Occassionally  . Drug Use: 2.00 per week    Special: Marijuana     Comment:  Active user.  Marland Kitchen Sexual Activity:    Partners: Female     Comment: Lives with his fiance.   Other Topics Concern  . None   Social History Narrative   Lives in [place] with his fiance.   Moved to [place] 11 years before. Used to live in [place] for 20 years before that.   Has 4 healthy kids from previous wife- separated now.   Is not working full-time since last 2 and half years- since early 2010.   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   Review of Systems: Constitutional: negative for fevers, chills, weight loss Gastrointestinal: negative for abdominal pain, nausea/vomiting, change in stools MSK: Positive for right upper back pain with radiculopathy extending to 2nd and 3rd digits. No weakness.   Objective:  Physical Exam: Filed Vitals:   10/12/14 1517  BP: 122/83  Pulse: 107  Temp: 98 F (36.7 C)  TempSrc: Oral  Height: 5\' 5"  (1.651 m)  Weight: 61.78 kg (136 lb 3.2 oz)  SpO2: 98%    Assessment & Plan:

## 2014-10-12 NOTE — Assessment & Plan Note (Signed)
Pt has had pain in right upper back over trapezius muscle with associated radiculopathy extending to his 2nd and 3rd digits for about 2 months now. He says this is stable but is interfering with his sleep. 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 He does not want to take any pain rx for now. Says he will follow-up with me in 3 months and re-visit the idea of seeing a sports medicine doctor for muscle U/S and further evaluation.

## 2014-10-13 NOTE — Progress Notes (Signed)
Internal Medicine Clinic Attending  I saw and evaluated the patient.  I personally confirmed the key portions of the history and exam documented by Dr. Flores and I reviewed pertinent patient test results.  The assessment, diagnosis, and plan were formulated together and I agree with the documentation in the resident's note.  

## 2015-02-08 ENCOUNTER — Ambulatory Visit: Payer: Self-pay

## 2015-02-15 ENCOUNTER — Encounter: Payer: Self-pay | Admitting: Internal Medicine

## 2015-02-18 ENCOUNTER — Ambulatory Visit: Payer: Self-pay

## 2015-03-08 ENCOUNTER — Encounter: Payer: Self-pay | Admitting: Internal Medicine

## 2015-03-08 ENCOUNTER — Ambulatory Visit (INDEPENDENT_AMBULATORY_CARE_PROVIDER_SITE_OTHER): Payer: Self-pay | Admitting: Internal Medicine

## 2015-03-08 VITALS — BP 114/87 | HR 103 | Temp 98.3°F | Ht 65.0 in | Wt 141.4 lb

## 2015-03-08 DIAGNOSIS — F17200 Nicotine dependence, unspecified, uncomplicated: Secondary | ICD-10-CM

## 2015-03-08 DIAGNOSIS — Z86010 Personal history of colonic polyps: Secondary | ICD-10-CM

## 2015-03-08 DIAGNOSIS — E785 Hyperlipidemia, unspecified: Secondary | ICD-10-CM

## 2015-03-08 NOTE — Assessment & Plan Note (Addendum)
He has a history of colonic polyps seen on a colonoscopy in 2013. He tells me these were adenomatous, and he was recommended to get another colonoscopy in 3 years.  ASSESSMENT: He is due for another colonoscopy now.  PLAN: Referred to gastroenterology for colonoscopy.

## 2015-03-08 NOTE — Assessment & Plan Note (Signed)
He is currently smoking 5 cigarettes per day, down from 1 pack per day last year. He is interested in quitting, but is very averse to taking medications. I recommended trying the patch and gum; he said he would give it some thought and call me if he decided to pursue it. I emphasized this is the single best thing he can do for his health and he was agreeable to give it some thought.  ASSESSMENT: He seems to be in the pre-contemplative stage of quitting  PLAN: I strongly encouraged him to quit; he will think about trying the patch and gum, and call me if he decides to do so.

## 2015-03-08 NOTE — Patient Instructions (Signed)
[name],  It was good to see you again today. Glad doing so well. Keep up the good work on cutting back on smoking, and diminish out if you want to start the nicotine patch and gum. Like I said, it is hands down the best thing you can do for your health, and I strongly encourage you to give quitting a serious thought.  I've put in your referral for the colonoscopy. I hope all goes well.  We can reconsider getting a lipid panel in 3 months such her next follow-up visit.  You doing a great job taking care of your health.  Let me know if there is anything I can do for you.  Have a Merry Christmas and I'll see you in 3 months, Dr. Melburn Hake

## 2015-03-08 NOTE — Progress Notes (Signed)
Patient ID: Jonathan [name], male   DOB: 1960-05-17, 54 y.o.   MRN: [id number]   Subjective:   Patient ID: Jonathan [name] male   DOB: 1960-04-13 54 y.o.   MRN: [id number]  HPI: [name] is a 54 y.o. man with a history of gastroesophageal reflux disease, tobacco use, and hyperlipidemia presenting for a three-month follow-up visit.  All in all, he reports he is doing quite well, and does not have any complaints whatsoever. He requests to get another referral to gastroenterology for another colonoscopy, as his last one was in 2013 and this showed adenomatous polyps with a recommendation of a repeat colonoscopy in 3 years.  Also, he is interested in quitting tobacco after cutting down significantly from 1 pack per day to 5 cigarettes per day  He says his gastroesophageal reflux disease is stable and well controlled on ranitidine, and denies alarm symptoms such as dysphagia or weight loss.  His left arm radicular symptoms have completely resolved after taking it easy for 2 weeks.  Please see the assessment and plan for the status of the patient's chronic medical problems.  Past Medical History  Diagnosis Date  . Arthritis     Bilateral knee arthritis for about 20 years. Not taking any medications, believes in nature processes  . HLD (hyperlipidemia)   . HTN (hypertension)   . GERD (gastroesophageal reflux disease)   . Allergic rhinitis   . Colon polyp    Current Outpatient Prescriptions  Medication Sig Dispense Refill  . diclofenac sodium (VOLTAREN) 1 % GEL Apply 2 g topically 4 (four) times daily. 100 g 2  . ranitidine (ZANTAC) 150 MG tablet Take 150 mg by mouth at bedtime. Pt takes OTC brand     No current facility-administered medications for this visit.   Family History  Problem Relation Age of Onset  . Prostate cancer Maternal Uncle 66    On radiation therapy now, diagnosed in around June 2012  . Colon cancer Maternal Uncle   . Heart attack Paternal Grandfather     Diet due  to MI  . Lung cancer Paternal Aunt   . Brain cancer Paternal Aunt   . Colon polyps Neg Hx   . Diabetes Neg Hx   . Kidney disease Maternal Uncle   . Gallbladder disease Neg Hx   . Esophageal cancer Neg Hx    Social History   Social History  . Marital Status: Single    Spouse Name: N/A  . Number of Children: 4  . Years of Education: N/A   Occupational History  . 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 Smoking status: Current Every Day Smoker -- 0.50 packs/day for 30 years    Types: Cigarettes  . Smokeless tobacco: Never Used     Comment: will think about it.  Cutting back 5 cigs per day  . Alcohol Use: 1.2 oz/week    2 Cans of beer per week     Comment: Occassionally  . Drug Use: 2.00 per week    Special: Marijuana     Comment: Active user.  Marland Kitchen Sexual Activity:    Partners: Female     Comment: Lives with his fiance.   Other Topics Concern  . None   Social History Narrative   Lives in [place] with his fiance.   Moved to [place] 11 years before. Used to live in [place] for 20 years before that.   Has 4 healthy kids from previous wife-  separated now.   Is not working full-time since last 2 and half years- since early 2010.   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   Review of Systems  Constitutional: Negative for fever, chills, weight loss and malaise/fatigue.  Respiratory: Negative for cough, shortness of breath and wheezing.   Cardiovascular: Negative for chest pain and orthopnea.  Gastrointestinal: Positive for heartburn. Negative for nausea, vomiting and abdominal pain.  Genitourinary: Negative for dysuria.  Musculoskeletal: Negative for myalgias and neck pain.  Skin: Negative for rash.  Neurological: Negative for dizziness, loss of consciousness and headaches.  Psychiatric/Behavioral: Negative for substance abuse.    Objective:  Physical Exam: Filed Vitals:   03/08/15 1512  BP: 114/87    Pulse: 103  Temp: 98.3 F (36.8 C)  TempSrc: Oral  Height: 5\' 5"  (1.651 m)  Weight: 141 lb 6.4 oz (64.139 kg)  SpO2: 99%   General: resting in chair comfortably, appropriately conversational HEENT: no scleral icterus, extra-ocular muscles intact, oropharynx without lesions Cardiac: regular rate and rhythm, no rubs, murmurs or gallops Pulm: breathing well, clear to auscultation bilaterally Abd: bowel sounds normal, soft, nondistended, non-tender Ext: warm and well perfused, without pedal edema Lymph: no cervical or supraclavicular lymphadenopathy Skin: no rash, hair, or nail changes Neuro: alert and oriented X3, cranial nerves II-XII grossly intact, moving all extremities well  Assessment & Plan:   Please see problem-based charting for my assessment and plan.

## 2015-03-08 NOTE — Assessment & Plan Note (Signed)
We stopped the statin and the last visit, per his request as he doesn't like taking medications. He would like to get another lipid panel and 3 months.  ASSESSMENT: Given that the statin will be for primary prevention, which is been shown to have a number needed to treat of 200, I don't think it's prudent to start a statin or get a lipid panel at this time.  PLAN: He can revisit the idea of getting a lipid panel in 3 months

## 2015-03-09 NOTE — Addendum Note (Signed)
Addended by: Lalla Brothers T on: 03/09/2015 01:43 PM   Modules accepted: Level of Service

## 2015-03-09 NOTE — Progress Notes (Signed)
Internal Medicine Clinic Attending  I saw and evaluated the patient.  I personally confirmed the key portions of the history and exam documented by Dr. Melburn Hake and I reviewed pertinent patient test results.  The assessment, diagnosis, and plan were formulated together and I agree with the documentation in the resident's note.  I personally spent greater than 3 minutes counseling on tobacco cessation. We offered nicotine replacement and other medication management. The patient is contemplative. We will follow up at our next visit.

## 2015-03-16 NOTE — Addendum Note (Signed)
Addended by: Yvonna Alanis E on: 03/16/2015 12:40 PM   Modules accepted: Orders

## 2015-06-03 ENCOUNTER — Encounter: Payer: Self-pay | Admitting: Internal Medicine

## 2015-06-03 ENCOUNTER — Ambulatory Visit (INDEPENDENT_AMBULATORY_CARE_PROVIDER_SITE_OTHER): Payer: Self-pay | Admitting: Internal Medicine

## 2015-06-03 VITALS — BP 143/97 | HR 86 | Temp 98.7°F | Ht 67.0 in | Wt 141.3 lb

## 2015-06-03 DIAGNOSIS — K047 Periapical abscess without sinus: Secondary | ICD-10-CM

## 2015-06-03 DIAGNOSIS — F17201 Nicotine dependence, unspecified, in remission: Secondary | ICD-10-CM

## 2015-06-03 DIAGNOSIS — F17200 Nicotine dependence, unspecified, uncomplicated: Secondary | ICD-10-CM

## 2015-06-03 DIAGNOSIS — K0889 Other specified disorders of teeth and supporting structures: Secondary | ICD-10-CM

## 2015-06-03 DIAGNOSIS — K029 Dental caries, unspecified: Secondary | ICD-10-CM | POA: Insufficient documentation

## 2015-06-03 DIAGNOSIS — R220 Localized swelling, mass and lump, head: Secondary | ICD-10-CM

## 2015-06-03 MED ORDER — OXYCODONE-ACETAMINOPHEN 5-325 MG PO TABS: 1.0000 | ORAL_TABLET | ORAL | Status: DC | PRN

## 2015-06-03 MED ORDER — AMOXICILLIN-POT CLAVULANATE 875-125 MG PO TABS: 1.0000 | ORAL_TABLET | Freq: Two times a day (BID) | ORAL | Status: AC

## 2015-06-03 MED ORDER — NICOTINE 14 MG/24HR TD PT24: 14.0000 mg | MEDICATED_PATCH | TRANSDERMAL | Status: DC

## 2015-06-03 NOTE — Patient Instructions (Signed)
Mr. Womble,  It was great to see you again today.  For your tooth ache, I have prescribed you an antibiotic to take for one week. I've also referred you to the dentist assuming that those teeth pulled. For your pain, I have prescribed you 10 pills of Vicodin. You can take ibuprofen 400 mg 4 times daily as well; this will help decrease the inflammation and alleviate the pain.  She is congratulated she is to you for quitting smoking. That is outstanding. I prescribed you a nicotine patch, that will hopefully cut down on your urge to smoke. It will be at the pharmacy for you.  Take care, and schedule a follow-up appointment to see me in 3-6 months.  Dr. Melburn Hake

## 2015-06-03 NOTE — Assessment & Plan Note (Signed)
Assessment: He quit smoking on 03/26/2015. He says he still has urges, and would like to try the nicotine patch to remain abstinent.  Plan: I commended him on his achievement, and prescribed a nicotine patch to help him remain abstinent.

## 2015-06-03 NOTE — Assessment & Plan Note (Addendum)
History: 2 days ago, he started having right upper tooth pain, and since then his right face is been swelling. He has a tender point just to the right of his nose. He denies any fevers, stridor, or tongue swelling.  Assessment: I think this is non-purulent cellulitis from a gingival abscess. Given his pronounced lip swelling, I also considered angioedema, but he has a localized point of tenderness with induration, he's not on an ACEi nor any other medications of shellfish exposure, so I think this is more likely infectious, probably related to a gingival infection.  Plan: I will treat him with 7 days of Augmentin and I have referred him to dentistry. I've given him 10 pills of Percocet for his pain and recommended he take ibuprofen for its anti-inflammatory effect as well.

## 2015-06-03 NOTE — Progress Notes (Signed)
Patient ID: Jonathan [name], male   DOB: 05/31/1960, 55 y.o.   MRN: [id number] [facility] Subjective:   Patient ID: Jonathan [name] male   DOB: 1960-05-11 55 y.o.   MRN: [id number]  HPI: Jonathan [name] is a 55 y.o. male with a history of gastroesophageal reflux disease and allergic rhinitis presenting with tooth pain and facial swelling.  He has successfully quit smoking since December 23rd.  I have reviewed his medications with him today.  Please see the assessment and plan for the status of the patient's chronic medical problems.  Review of Systems  Constitutional: Negative for fever and chills.  HENT: Negative for sore throat.        Tooth pain and facial swelling  Respiratory: Negative for cough, shortness of breath and stridor.   Skin: Negative for rash.   Objective:  Physical Exam: Filed Vitals:   06/03/15 1045  BP: 143/97  Pulse: 86  Temp: 98.7 F (37.1 C)  TempSrc: Oral  Height: 5\' 7"  (1.702 m)  Weight: 141 lb 4.8 oz (64.093 kg)  SpO2: 100%   General: resting in chair comfortably, appropriately conversational HEENT: poor dentition throughout without a palpable abscess, indurated plaque on left nasolabial fold with surrounding subcutaneous edema that is slightly warm to the touch, without drainage Cardiac: regular rate and rhythm, no rubs, murmurs or gallops Pulm: breathing well, clear to auscultation bilaterally  Assessment & Plan:  Case discussed with Dr. Beryle Beams  Tooth abscess History: 2 days ago, he started having right upper tooth pain, and since then his right face is been swelling. He has a tender point just to the right of his nose. He denies any fevers, stridor, or tongue swelling.  Assessment: I think this is non-purulent cellulitis from a gingival abscess. Given his pronounced lip swelling, I also considered angioedema, but he has a localized point of tenderness with induration, he's not on an ACEi nor any other medications of  shellfish exposure, so I think this is more likely infectious, probably related to a gingival infection.  Plan: I will treat him with 7 days of Augmentin and I have referred him to dentistry. I've given him 10 pills of Percocet for his pain and recommended he take ibuprofen for its anti-inflammatory effect as well.  Tobacco use disorder Assessment: He quit smoking on 03/26/2015. He says he still has urges, and would like to try the nicotine patch to remain abstinent.  Plan: I commended him on his achievement, and prescribed a nicotine patch to help him remain abstinent.   Medications Ordered Meds ordered this encounter  Medications  . amoxicillin-clavulanate (AUGMENTIN) 875-125 MG tablet    Sig: Take 1 tablet by mouth 2 (two) times daily. For 1 week    Dispense:  14 tablet    Refill:  0  . DISCONTD: oxyCODONE-acetaminophen (PERCOCET/ROXICET) 5-325 MG tablet    Sig: Take 1 tablet by mouth every 4 (four) hours as needed for severe pain.    Dispense:  10 tablet    Refill:  0  . nicotine (NICODERM CQ - DOSED IN MG/24 HOURS) 14 mg/24hr patch    Sig: Place 1 patch (14 mg total) onto the skin daily.    Dispense:  30 patch    Refill:  0  . oxyCODONE-acetaminophen (PERCOCET/ROXICET) 5-325 MG tablet    Sig: Take 1 tablet by mouth every 4 (four) hours as needed for severe pain.    Dispense:  10 tablet    Refill:  0   Other Orders No orders of the defined types were placed in this encounter.   Follow Up: Return in about 3 months (around 09/03/2015) for follow-up.

## 2015-06-03 NOTE — Progress Notes (Signed)
Medicine attending: Medical history, presenting problems, physical findings, and medications, reviewed with resident physician Dr Kyle Flores on the day of the patient visit and I concur with his evaluation and management plan. 

## 2015-06-09 ENCOUNTER — Encounter: Payer: Self-pay | Admitting: Internal Medicine

## 2015-08-16 ENCOUNTER — Ambulatory Visit: Payer: Self-pay

## 2015-08-19 ENCOUNTER — Telehealth: Payer: Self-pay | Admitting: Internal Medicine

## 2015-08-19 ENCOUNTER — Ambulatory Visit: Payer: Self-pay

## 2015-08-19 NOTE — Telephone Encounter (Signed)
APT. REMINDER CALL, LMTCB °

## 2015-08-20 ENCOUNTER — Ambulatory Visit: Payer: Self-pay

## 2015-09-21 ENCOUNTER — Encounter: Payer: Self-pay | Admitting: *Deleted

## 2015-11-08 ENCOUNTER — Encounter (INDEPENDENT_AMBULATORY_CARE_PROVIDER_SITE_OTHER): Payer: Self-pay

## 2015-11-08 ENCOUNTER — Ambulatory Visit (INDEPENDENT_AMBULATORY_CARE_PROVIDER_SITE_OTHER): Payer: Self-pay | Admitting: Internal Medicine

## 2015-11-08 ENCOUNTER — Encounter: Payer: Self-pay | Admitting: Internal Medicine

## 2015-11-08 VITALS — BP 142/89 | HR 89 | Temp 98.4°F | Wt 137.8 lb

## 2015-11-08 DIAGNOSIS — R590 Localized enlarged lymph nodes: Secondary | ICD-10-CM

## 2015-11-08 DIAGNOSIS — F17201 Nicotine dependence, unspecified, in remission: Secondary | ICD-10-CM

## 2015-11-08 DIAGNOSIS — K0889 Other specified disorders of teeth and supporting structures: Secondary | ICD-10-CM

## 2015-11-08 DIAGNOSIS — J029 Acute pharyngitis, unspecified: Secondary | ICD-10-CM

## 2015-11-08 DIAGNOSIS — K219 Gastro-esophageal reflux disease without esophagitis: Secondary | ICD-10-CM

## 2015-11-08 DIAGNOSIS — Z86010 Personal history of colonic polyps: Secondary | ICD-10-CM

## 2015-11-08 DIAGNOSIS — B370 Candidal stomatitis: Secondary | ICD-10-CM | POA: Insufficient documentation

## 2015-11-08 DIAGNOSIS — K029 Dental caries, unspecified: Secondary | ICD-10-CM

## 2015-11-08 MED ORDER — FLUCONAZOLE 200 MG PO TABS: 200.0000 mg | ORAL_TABLET | ORAL | 0 refills | Status: AC

## 2015-11-08 MED ORDER — ESOMEPRAZOLE MAGNESIUM 20 MG PO CPDR: 20.0000 mg | DELAYED_RELEASE_CAPSULE | Freq: Every day | ORAL | 1 refills | Status: DC

## 2015-11-08 NOTE — Patient Instructions (Addendum)
Please take fluconazole 200 mg (1 tab) by mouth every other day for 3 doses. Also, please stop taking ranitidine and start taking nexium daily for your reflux and symptoms of sore throat. Return to clinic in 3 months for follow up, or sooner if symptoms worsen or do not improve. We will refer you to a dentist for your teeth. Please call if you have any questions or concerns.  Thank you!

## 2015-11-08 NOTE — Assessment & Plan Note (Signed)
Last seen in march for a tooth abscess, now resolved after treatment with Augmentin. No signs of infection today. Overall poor dentition with multiple necrotic teeth on exam. Will refer to dentist today.

## 2015-11-08 NOTE — Assessment & Plan Note (Signed)
Patient presents today with complains of sore throat and odynophagia. Physical exam significant for white plaques on tongue consistent with thrush and painful, cervical LAD. Will recheck HIV antibodies today and treat with a short course of fluconazole.

## 2015-11-08 NOTE — Assessment & Plan Note (Addendum)
Complains of sore throat. Recently quit smoking. Will change Zantac to nexium and f/u in 3 months.

## 2015-11-08 NOTE — Progress Notes (Signed)
   CC: Sore Throat  HPI:  [name] is a 55 y.o. who presents today with complaint of sore throat since march. He endorses pain with swallowing and voice hoarseness. He denies fevers at home and cough. He was last seen in clinic in march and was prescribed 7 days of Augmentin for a tooth abscess. Tooth pain has improved today however patient has not yet seen a dentists.   Past Medical History:  Diagnosis Date  . Allergic rhinitis   . Arthritis    Bilateral knee arthritis for about 20 years. Not taking any medications, believes in nature processes  . Colon polyp   . GERD (gastroesophageal reflux disease)   . HLD (hyperlipidemia)   . HTN (hypertension)     Review of Systems:  All pertinents listed in HPI, otherwise negative.   Physical Exam:  Constitutional: NAD, appears comfortable HEENT: White plaques on tongue, removable with tongue depressor. No signs of tonsillitis or epiglottitis. Overall poor dentition with multiple necrotic teeth.  Neck: Supple, trachea midline. Bilateral cervical lymphadenopathy painful to palpation  Cardiovascular: RRR, no murmurs, rubs, or gallops.  Pulmonary/Chest: CTAB, no wheezes, rales, or rhonchi. No chest wall abnormalities.  Abdominal: Soft, non tender, non distended. +BS.  Extremities: Warm and well perfused. Distal pulses intact. No edema.  Neurological: A&Ox3, CN II - XII grossly intact.  Skin: No rashes or erythema  Psychiatric: Normal mood and affect  Vitals:   11/08/15 1342  BP: (!) 151/89  Pulse: 98  SpO2: 99%  Weight: 137 lb 12.8 oz (62.5 kg)    Assessment & Plan:   See Encounters Tab for problem based charting.  Patient seen with Dr. Evette Doffing

## 2015-11-09 LAB — HIV ANTIBODY (ROUTINE TESTING W REFLEX): HIV SCREEN 4TH GENERATION: NONREACTIVE

## 2015-11-09 NOTE — Addendum Note (Signed)
Addended by: Marcelino Duster on: 11/09/2015 09:57 AM   Modules accepted: Orders

## 2015-11-10 NOTE — Progress Notes (Signed)
Internal Medicine Clinic Attending  I saw and evaluated the patient.  I personally confirmed the key portions of the history and exam documented by Dr. Guilloud and I reviewed pertinent patient test results.  The assessment, diagnosis, and plan were formulated together and I agree with the documentation in the resident's note.  

## 2015-11-15 ENCOUNTER — Encounter: Payer: Self-pay | Admitting: Internal Medicine

## 2015-11-18 ENCOUNTER — Telehealth: Payer: Self-pay | Admitting: Internal Medicine

## 2015-11-18 NOTE — Telephone Encounter (Signed)
APT. REMINDER CALL, LMTCB °

## 2015-11-19 ENCOUNTER — Ambulatory Visit (INDEPENDENT_AMBULATORY_CARE_PROVIDER_SITE_OTHER): Payer: Self-pay | Admitting: Internal Medicine

## 2015-11-19 DIAGNOSIS — R490 Dysphonia: Secondary | ICD-10-CM

## 2015-11-19 DIAGNOSIS — F17210 Nicotine dependence, cigarettes, uncomplicated: Secondary | ICD-10-CM

## 2015-11-19 NOTE — Patient Instructions (Signed)
Jonathan [name]  It was a pleasure meeting you today.  Please start taking her Nexium for acid reflux daily.  A referral has been made to ENT (ear nose and throat) and you will receive a phone call to schedule an appointment.  Thank you.

## 2015-11-19 NOTE — Progress Notes (Signed)
   CC: follow-up on chronic hoarseness  HPI:  [name] is a 55 y.o.male with past medical history noted below presents to [facility] for chronic hoarseness and throat pain that's been going on since March 2017.  He was recently seen in the clinic and treated for oral thrush with fluconazole 3 doses every other day. He presents today because he states he continues to have hoarseness and throat pain.  He states that his hoarseness has not changed since March 2017 and today notes this is a bigger concern to him than the throat pain.  He has a 35 year smoking history of about 1 pack a day for 10 years and after that about half a pack a day. He has tried quitting in the past with nicotine patches but admits to currently smoking. For the past 3 weeks he reports smoking a couple cigarettes a day.    Past Medical History:  Diagnosis Date  . Allergic rhinitis   . Arthritis    Bilateral knee arthritis for about 20 years. Not taking any medications, believes in nature processes  . Colon polyp   . GERD (gastroesophageal reflux disease)   . HLD (hyperlipidemia)   . HTN (hypertension)     Review of Systems:  Review of Systems  Eyes: Negative for blurred vision.  Respiratory: Negative for shortness of breath.   Cardiovascular: Negative for chest pain.  Gastrointestinal: Positive for heartburn. Negative for abdominal pain, constipation, diarrhea, nausea and vomiting.  Neurological: Positive for headaches. Negative for dizziness.     Physical Exam:  There were no vitals filed for this visit. Physical Exam  Constitutional:  thin  HENT:  Head: Normocephalic and atraumatic.  Mouth/Throat: Oropharynx is clear and moist.  White exudate on tongue no exudate noted on rest of oropharynx  Neck: Neck supple.  Cardiovascular: Normal rate, regular rhythm and normal heart sounds.   Pulmonary/Chest: Effort normal and breath sounds normal. He has no wheezes.  Abdominal: Soft. There is no tenderness.    Musculoskeletal: He exhibits no edema.  Lymphadenopathy:    He has cervical adenopathy (tender enlarged bilateral anterior cervical chain).  Skin: Skin is warm and dry.     Assessment & Plan:   See encounters tab for problem based medical decision making.   Patient seen with Dr. Evette Doffing

## 2015-11-21 DIAGNOSIS — R490 Dysphonia: Secondary | ICD-10-CM | POA: Insufficient documentation

## 2015-11-21 NOTE — Assessment & Plan Note (Addendum)
Assessment: Hoarseness in smoker   Plan: I do not suspect thrush as the cause for his ongoing hoarseness,  I am most concerned at this point to rule out Sq Cell cancer of the vocal cords and will refer to ENT for laryngoscopy.  Also, in the differential is GERD induced hoarseness (he never started nexium) I advised him to go ahead and start taking nexium.  I also counseled on the importance of not smoking.

## 2015-11-23 NOTE — Progress Notes (Signed)
Internal Medicine Clinic Attending  I saw and evaluated the patient.  I personally confirmed the key portions of the history and exam documented by Dr. Hoffman and I reviewed pertinent patient test results.  The assessment, diagnosis, and plan were formulated together and I agree with the documentation in the resident's note.      

## 2016-02-03 ENCOUNTER — Telehealth: Payer: Self-pay | Admitting: Internal Medicine

## 2016-02-03 NOTE — Telephone Encounter (Signed)
LMTCB-TIME TO RENEW GCCN CARD

## 2016-02-16 ENCOUNTER — Ambulatory Visit: Payer: Self-pay

## 2016-02-17 ENCOUNTER — Ambulatory Visit: Payer: Self-pay

## 2016-02-18 ENCOUNTER — Ambulatory Visit: Payer: Self-pay

## 2016-02-21 ENCOUNTER — Telehealth: Payer: Self-pay | Admitting: Internal Medicine

## 2016-02-21 ENCOUNTER — Encounter: Payer: Self-pay | Admitting: Internal Medicine

## 2016-02-21 NOTE — Telephone Encounter (Signed)
APT. REMINDER CALL, LMTCB °

## 2016-02-22 ENCOUNTER — Ambulatory Visit: Payer: Self-pay

## 2016-03-13 ENCOUNTER — Ambulatory Visit (INDEPENDENT_AMBULATORY_CARE_PROVIDER_SITE_OTHER): Payer: Self-pay | Admitting: Internal Medicine

## 2016-03-13 ENCOUNTER — Encounter: Payer: Self-pay | Admitting: Internal Medicine

## 2016-03-13 VITALS — BP 143/87 | HR 98 | Temp 98.3°F | Ht 65.0 in | Wt 136.6 lb

## 2016-03-13 DIAGNOSIS — R490 Dysphonia: Secondary | ICD-10-CM

## 2016-03-13 DIAGNOSIS — F17211 Nicotine dependence, cigarettes, in remission: Secondary | ICD-10-CM

## 2016-03-13 MED ORDER — OXYCODONE-ACETAMINOPHEN 5-325 MG PO TABS: 1.0000 | ORAL_TABLET | ORAL | 0 refills | Status: DC | PRN

## 2016-03-13 NOTE — Progress Notes (Signed)
   CC: Hoarseness, odynophagia   HPI:  [name] is a 55 y.o. here for follow up of hoarse voice and odynophagia that has been going on since March of 2017. He was seen August of 2017 and found to have oral thrush. He was treated with 3 doses of diflucan every other day and started on Nexium for possible GERD. Patient followed up a week later and his thrush had resolved, but he continued to have severe pain with swallowing and his hoarseness had worsened. Given his extensive smoking history (40 years) there was concern for malignancy of his vocal cords and he was referred to ENT for laryngoscopy. Today patient says he was never contacted for an appointment. His hoarseness has persisted and he is only able to tolerate soft foods due to the pain with swallowing. He denies weight loss and fevers.   Past Medical History:  Diagnosis Date  . Allergic rhinitis   . Arthritis    Bilateral knee arthritis for about 20 years. Not taking any medications, believes in nature processes  . Colon polyp   . GERD (gastroesophageal reflux disease)   . HLD (hyperlipidemia)   . HTN (hypertension)     Review of Systems:  All pertinents listed in HPI, otherwise negative.    Vitals:   03/13/16 1545  BP: (!) 143/87  Pulse: 98  Temp: 98.3 F (36.8 C)  TempSrc: Oral  SpO2: 99%  Weight: 136 lb 9.6 oz (62 kg)  Height: 5\' 5"  (1.651 m)   Physical Exam Constitutional: Voice is extremely hoarse, NAD, appears comfortable HEENT: Oropharynx with normal mucosa, no evidence of recurrent thrush. Painless submandibular lymphadenopathy bilaterally  Neck: Supple, trachea midline.  Cardiovascular: RRR, no murmurs, rubs, or gallops.  Pulmonary/Chest: CTAB, no wheezes, rales, or rhonchi.  Abdominal: Soft, non tender, non distended. +BS.  Extremities: Warm and well perfused. Distal pulses intact. No edema.   Assessment & Plan:   See Encounters Tab for problem based charting.  Patient seen with Dr. Lynnae January

## 2016-03-13 NOTE — Patient Instructions (Signed)
We have placed an urgent referral to ENT. If you do not hear from anyone by next week, please give our office a call at [phone]. Please follow up after your ENT appointment or sooner if you need [place].

## 2016-03-13 NOTE — Assessment & Plan Note (Addendum)
Patient continues to have hoarse voice and odynophagia despite Nexium treatment and fluconazole for his thrush which has now resolved. In the setting of his extensive smoking history I am very concerned for malignancy of his vocal cords vs. Esophagitis given his recent thrush. Although it is still unclear why the patient had thrush. HIV antibodies were negative and he is not on any predisposing medications. He previously smoked 1 PPD for 40 years but recently quit. Says he has lost the desire to smoke over the past few months and is now using a nicotine patch.  -- Urgent referral to ENT for laryngoscopy  -- If patient is unable to be seen by ENT in the near future, will try urgent referral to GI for upper endoscopy  -- Given percocet q4hrs prn for pain (#30 tablets)  -- Instructed patient to call if he has not been contacted by next week for an appointment. May require referral by phone.  -- Follow up after ENT appointment or sooner if there are issues

## 2016-03-15 NOTE — Progress Notes (Signed)
Internal Medicine Clinic Attending  I saw and evaluated the patient.  I personally confirmed the key portions of the history and exam documented by Dr. Guilloud and I reviewed pertinent patient test results.  The assessment, diagnosis, and plan were formulated together and I agree with the documentation in the resident's note.  

## 2016-04-04 ENCOUNTER — Other Ambulatory Visit: Payer: Self-pay

## 2016-04-10 ENCOUNTER — Telehealth: Payer: Self-pay | Admitting: Internal Medicine

## 2016-04-10 DIAGNOSIS — R1310 Dysphagia, unspecified: Secondary | ICD-10-CM

## 2016-04-10 NOTE — Telephone Encounter (Signed)
Called patient today to follow up on ENT referral. He has now been referred to ENT three times unsuccessfully (urgent referral to ENT both here and at [facility]). He continues to have significant pain, odynophagia, and hoarse voice. I am very concerned for either severe esophagitis (recent oral thrush) or a vocal cord malignancy given his extensive smoking history. Scheduled patient for [facility] 04/12/16 at 1:45pm. Will plan for urgent CT neck to evaluate for a mass/lesion. If negative, patient may need direct admission for upper endoscopy. Updated patient on plan.

## 2016-04-11 ENCOUNTER — Telehealth: Payer: Self-pay | Admitting: Internal Medicine

## 2016-04-11 NOTE — Telephone Encounter (Signed)
APT. REMINDER CALL, LMTCB °

## 2016-04-12 ENCOUNTER — Ambulatory Visit (INDEPENDENT_AMBULATORY_CARE_PROVIDER_SITE_OTHER): Payer: Self-pay | Admitting: Internal Medicine

## 2016-04-12 VITALS — BP 128/89 | HR 94 | Temp 98.0°F | Ht 65.0 in | Wt 130.6 lb

## 2016-04-12 DIAGNOSIS — R591 Generalized enlarged lymph nodes: Secondary | ICD-10-CM

## 2016-04-12 DIAGNOSIS — R634 Abnormal weight loss: Secondary | ICD-10-CM

## 2016-04-12 DIAGNOSIS — K219 Gastro-esophageal reflux disease without esophagitis: Secondary | ICD-10-CM

## 2016-04-12 DIAGNOSIS — F17211 Nicotine dependence, cigarettes, in remission: Secondary | ICD-10-CM

## 2016-04-12 DIAGNOSIS — R490 Dysphonia: Secondary | ICD-10-CM

## 2016-04-12 DIAGNOSIS — F17200 Nicotine dependence, unspecified, uncomplicated: Secondary | ICD-10-CM

## 2016-04-12 DIAGNOSIS — R1310 Dysphagia, unspecified: Secondary | ICD-10-CM

## 2016-04-12 NOTE — Progress Notes (Signed)
   CC: Hoarseness  HPI:  [name] is a 56 y.o. with a history of chronic allergic rhinitis, GERD, and tobacco use here for  follow up for chronic hoarseness for several months. This problem has been evaluated at our clinic regularly since August 2017. He has odynophagia even swallowing usual secretions and saliva. He can only eat soft foods because of this pain. He also has constant sinus congestion with drainage that worsens when lying supine. He has been able to stay off any cigarettes since September when his symptoms started to get much worse. He had partial relief with oxycodone that was prescribed last month. His throat exam has been concerning in office visits with thrush noted previously and he was referred to ENT clinic but was unable to arrange an appointment for months due to insurance problems. He has also been scheduled for CT of his neck on 1/15 and was unable to arrange this sooner. During this time he has continued to lose weight from 136 at last visit in December down to 130 today.  He recently had some fever and chills as well as GI upset with diarrhea and nausea since Sunday that improved by yesterday. He has sick contacts at home including his fiancee and several younger relatives.  He also reports some left shoulder pain that has been going on for 3 weeks with tingling that is worst lying on his right side. There is no radiation of this pain. His shoulder pain does not worsen with use.  See problem based assessment and plan below for additional details  Past Medical History:  Diagnosis Date  . Allergic rhinitis   . Arthritis    Bilateral knee arthritis for about 20 years. Not taking any medications, believes in nature processes  . Colon polyp   . GERD (gastroesophageal reflux disease)   . HLD (hyperlipidemia)   . HTN (hypertension)     Review of Systems:  Review of Systems  Constitutional: Positive for weight loss. Negative for fever.  HENT: Positive for sore  throat.   Eyes: Negative for blurred vision.  Respiratory: Positive for cough and shortness of breath.   Cardiovascular: Negative for chest pain.  Gastrointestinal: Positive for heartburn. Negative for blood in stool.  Genitourinary: Negative for hematuria.  Neurological: Negative for sensory change.    Physical Exam: Physical Exam  Constitutional: He is oriented to person, place, and time.  Thin  HENT:  Oropharynx is clear, painless submandibular and anterior cervical lymphadenopathy is palpable bilaterally, hoarse voice  Eyes: Conjunctivae are normal.  Cardiovascular: Normal rate and regular rhythm.   Pulmonary/Chest: Effort normal and breath sounds normal.  Neurological: He is alert and oriented to person, place, and time. Gait normal.  Skin: Skin is warm and dry. No rash noted.  Psychiatric: Affect normal.    Vitals:   04/12/16 1345  BP: 128/89  Pulse: 94  Temp: 98 F (36.7 C)  TempSrc: Oral  SpO2: 99%  Weight: 130 lb 9.6 oz (59.2 kg)  Height: 5\' 5"  (1.651 m)    Assessment & Plan:   See Encounters Tab for problem based charting.  Patient seen with Dr. Dareen Piano

## 2016-04-14 NOTE — Assessment & Plan Note (Addendum)
Jonathan [name] hoarseness and odynophagia combined with his ongoing weight loss and palpable lymphadenopathy are very concerning to me for malignancy. Unfortunately he has not had appropriate specialty clinic evaluation already despite this problem ongoing for several months. He is taking nexium as prescribed with improvement to his heartburn but no improvement in his throat symptoms for months so unless there is some kind of severe scarring I do not see how this could all be esophagitis.  He has an appointment for outpatient CT scan on the 15th of this month so I think it is reasonable to follow up after that imaging with more information. He may need hospital admission for workup if he cannot get an urgent clinic based evaluation after this imaging next week.

## 2016-04-14 NOTE — Progress Notes (Signed)
Internal Medicine Clinic Attending  I saw and evaluated the patient.  I personally confirmed the key portions of the history and exam documented by Dr. Rice and I reviewed pertinent patient test results.  The assessment, diagnosis, and plan were formulated together and I agree with the documentation in the resident's note.  

## 2016-04-14 NOTE — Assessment & Plan Note (Signed)
He continues to avoid smoking.

## 2016-04-14 NOTE — Assessment & Plan Note (Signed)
His heartburn symptoms are improved on nexium but he has no improvement to his pretty severe odynophagia and decreased PO intake.

## 2016-04-17 ENCOUNTER — Ambulatory Visit (HOSPITAL_COMMUNITY)
Admission: RE | Admit: 2016-04-17 | Discharge: 2016-04-17 | Disposition: A | Discharge status: Home / Self Care | Payer: Self-pay | Source: Ambulatory Visit | Attending: Internal Medicine | Admitting: Internal Medicine

## 2016-04-17 ENCOUNTER — Telehealth: Payer: Self-pay | Admitting: Internal Medicine

## 2016-04-17 ENCOUNTER — Encounter (HOSPITAL_COMMUNITY): Payer: Self-pay

## 2016-04-17 DIAGNOSIS — R1310 Dysphagia, unspecified: Secondary | ICD-10-CM

## 2016-04-17 DIAGNOSIS — R591 Generalized enlarged lymph nodes: Secondary | ICD-10-CM | POA: Insufficient documentation

## 2016-04-17 DIAGNOSIS — J387 Other diseases of larynx: Secondary | ICD-10-CM | POA: Insufficient documentation

## 2016-04-17 DIAGNOSIS — R938 Abnormal findings on diagnostic imaging of other specified body structures: Secondary | ICD-10-CM | POA: Insufficient documentation

## 2016-04-17 MED ORDER — IOPAMIDOL (ISOVUE-300) INJECTION 61%
INTRAVENOUS | Status: AC
  Administered 2016-04-17: 75 mL
  Filled 2016-04-17: qty 75

## 2016-04-18 ENCOUNTER — Telehealth: Payer: Self-pay | Admitting: *Deleted

## 2016-04-18 NOTE — Telephone Encounter (Addendum)
Oncology Nurse Navigator Documentation  Called patient to establish contact s/p receipt of referral note from Dr. Elmer Picker Int Med via Dr. Benay Spice.  Reached him at his M #, not preferred H #. Introduced myself as H&N Art therapist, informed him I am working on [employer] referrals to [employer], ENT referral.   He understands I will provide him updates, encouraged him to call me as needed. He expressed appreciation for my call.  [name], RN, BSN, [place] [facility] Oncology Nurse [place] at [place] [phone]

## 2016-04-18 NOTE — Telephone Encounter (Signed)
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 Patient handled the news well. He said he was not surprised and had been expecting the call. He was very appreciative and eager to move forward with work up and treatment. I also called and discussed his case with Dr. Redmond Baseman (ENT) who was on call at the time. He recommended outpatient ENT follow up and oncology referral. I subsequently called oncology who is actively working to get him established outpatient with radiation oncology and medical oncology if indicated. Today I spoke with [name] 1/16 (Head & Neck Onc Nurse Navigator) who is also assisting in getting him established with ENT as our prior referrals have been unsuccessful thus far. If oncology deems biopsy necessary prior to initiating treatment and he is unable to be seen by ENT, patient may need admission for lymph node biopsy. I will continue to follow his case closely and follow with up with oncology and ENT recommendations. Appreciate everyone's assistance in this case.

## 2016-04-18 NOTE — Telephone Encounter (Signed)
Oncology Nurse Navigator Documentation  Received return call from Dr. Philipp Ovens.  She provided clarification on her efforts for ENT referral.  I informed her I have been in contact with Mr. Rehrer, that he has appt with Dr. Isidore Moos for next Tuesday.  She expressed appreciation for update.  [name], RN, BSN, [place] [facility] Oncology Nurse [place] at [place] [phone]

## 2016-04-18 NOTE — Telephone Encounter (Signed)
Oncology Nurse Navigator Documentation  Spoke with Jonathan [name], ENT Dr. Janeice Robinson [name].  She facilitated referral for Jonathan [name] to be seen by Dr. Constance Holster this Friday 3:20 PM.  I called Jonathan [name] to inform, provided him address for [facility].  He understands his appt with Dr. Isidore Moos will be rescheduled pending outcome of this appt.  Dr. Philipp Ovens and Dr. Isidore Moos informed.  [name], RN, BSN, [place] [facility] Oncology Nurse [place] at [place] [phone]

## 2016-04-21 ENCOUNTER — Ambulatory Visit: Payer: Self-pay | Admitting: Otolaryngology

## 2016-04-21 ENCOUNTER — Encounter (HOSPITAL_COMMUNITY): Payer: Self-pay | Admitting: *Deleted

## 2016-04-21 NOTE — H&P (Signed)
Jonathan [name] is a 56 y.o. male who presents as a new  Patient.   Referring Provider: Referral, Self   Chief complaint: Throat problems.  HPI: About a year ago he developed hoarseness.  It has progressed.  He has developed right side ear pain, and difficulty swallowing.  He has lost about 10 pounds.  He drinks beer occasionally and quit smoking several months ago.  He had a CT of the neck recently.  I have reviewed the scan.  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  PMH/Meds/All/SocHx/FamHx/ROS:   Past Medical History  History reviewed. No pertinent past medical history.    Past Surgical History  History reviewed. No pertinent surgical history.    No family history of bleeding disorders, wound healing problems or difficulty with anesthesia.   Social History  Social History        Social History  . Marital status: Single    Spouse name: N/A  . Number of children: N/A  . Years of education: N/A      Occupational History  . Not on file.       Social History Main Topics  . Smoking status: Never Smoker  . Smokeless tobacco: Never Used  . Alcohol use Not on file  . Drug use: Unknown  . Sexual activity: Not on file       Other Topics Concern  . Not on file      Social History Narrative  . No narrative on file       Current Outpatient Prescriptions:  .  ESOMEPRAZOLE MAGNESIUM (NEXIUM ORAL), Take by mouth., Disp: , Rfl:   A complete ROS was performed with pertinent positives/negatives noted in the HPI. The remainder of the ROS are negative.    Physical Exam:    BP 134/90 (Site: Left arm, Position: Sitting)   Ht 1.651 m (5\' 5" )   Wt 59 kg (130 lb)   BMI 21.63 kg/m    General:  Healthy and alert, in no distress, breathing easily. Normal affect. In a pleasant mood. Head: Normocephalic, atraumatic. No masses, or scars. Eyes: Pupils are equal, and reactive to light. Vision is grossly intact. No spontaneous or gaze  nystagmus. Ears: Ear canals are clear. Tympanic membranes are intact, with normal landmarks and the middle ears are clear and healthy. Hearing: Grossly normal. Nose: Nasal cavities are clear with healthy mucosa, no polyps or exudate.Airways are patent. Face: No masses or scars, facial nerve function is symmetric. Oral Cavity: No mucosal abnormalities are noted. Tongue with normal mobility. Dentition appears healthy. Oropharynx: Tonsils are symmetric. There are no mucosal masses identified. Tongue base appears normal and healthy. Larynx/Hypopharynx: Inadequate to examine the larynx. Chest: Deferred Neck: There is a fullness in the right level 2 area, no thyroid nodules or enlargement. Neuro: Cranial nerves II-XII will normal function. Balance: Normal gate. Other findings: none.   Independent Review of Additional Tests or Records:  none  Procedures:  Procedure note:  Flexible fiberoptic laryngoscopy  Details of the procedure were explained to the patient and all questions were answered.   Procedure:    After anesthetizing the nasal cavity with topical lidocaine and oxymetazoline, the flexible endoscope was introduced and passed through the nasal cavity into the nasopharynx. The scope was then advanced to the level of the oropharynx, then the hypopharynx and larynx.  Findings:    The posterior soft palate, uvula, tongue base and vallecula were visualized and appeared healthy without mucosal masses or lesions.  There is a large mass involving the right side of the larynx, the right cord is fixed.  There may be involvement of the post cricoid area it is difficult to say for sure.   Additional findings: None  The scope was withdrawn from the nose. He tolerated the procedure well.       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  Clinically this is at least staged as T3.  The airway is significantly impaired.  He is at risk  for impending airway obstruction.  Recommend we schedule him for awake tracheostomy with direct laryngoscopy and biopsy and esophagoscopy under anesthesia immediately following that.  We discussed risks and benefits.  We discussed the possible need for laryngectomy, possible need for reconstructive surgery at [facility], possible need for chemo/radiation as primary treatment.

## 2016-04-21 NOTE — Progress Notes (Signed)
Spoke with pt for pre-op call. Pt denies cardiac history, chest pain or sob. 

## 2016-04-24 ENCOUNTER — Encounter (HOSPITAL_COMMUNITY): Payer: Self-pay | Admitting: Anesthesiology

## 2016-04-24 ENCOUNTER — Ambulatory Visit (HOSPITAL_COMMUNITY): Payer: Self-pay | Admitting: Anesthesiology

## 2016-04-24 ENCOUNTER — Encounter (HOSPITAL_COMMUNITY)
Admission: RE | Disposition: A | Discharge status: Home Health | Payer: Self-pay | Source: Ambulatory Visit | Attending: Otolaryngology

## 2016-04-24 ENCOUNTER — Inpatient Hospital Stay (HOSPITAL_COMMUNITY)
Admission: RE | Admit: 2016-04-24 | Discharge: 2016-04-29 | DRG: 012 | Disposition: A | Discharge status: Home Health | Payer: Self-pay | Source: Ambulatory Visit | Attending: Otolaryngology | Admitting: Otolaryngology

## 2016-04-24 DIAGNOSIS — Z538 Procedure and treatment not carried out for other reasons: Secondary | ICD-10-CM | POA: Diagnosis not present

## 2016-04-24 DIAGNOSIS — K219 Gastro-esophageal reflux disease without esophagitis: Secondary | ICD-10-CM | POA: Diagnosis present

## 2016-04-24 DIAGNOSIS — C321 Malignant neoplasm of supraglottis: Principal | ICD-10-CM | POA: Diagnosis present

## 2016-04-24 DIAGNOSIS — Z930 Tracheostomy status: Secondary | ICD-10-CM

## 2016-04-24 DIAGNOSIS — J9501 Hemorrhage from tracheostomy stoma: Secondary | ICD-10-CM | POA: Diagnosis not present

## 2016-04-24 DIAGNOSIS — Z23 Encounter for immunization: Secondary | ICD-10-CM

## 2016-04-24 DIAGNOSIS — Z87891 Personal history of nicotine dependence: Secondary | ICD-10-CM

## 2016-04-24 DIAGNOSIS — Z419 Encounter for procedure for purposes other than remedying health state, unspecified: Secondary | ICD-10-CM

## 2016-04-24 DIAGNOSIS — Z79899 Other long term (current) drug therapy: Secondary | ICD-10-CM

## 2016-04-24 HISTORY — PX: TRACHEOSTOMY TUBE PLACEMENT: SHX814

## 2016-04-24 HISTORY — PX: ESOPHAGOSCOPY: SHX5534

## 2016-04-24 HISTORY — PX: LARYNGOSCOPY: SHX5203

## 2016-04-24 LAB — CBC
HCT: 46.4 % (ref 39.0–52.0)
Hemoglobin: 15.5 g/dL (ref 13.0–17.0)
MCH: 31.3 pg (ref 26.0–34.0)
MCHC: 33.4 g/dL (ref 30.0–36.0)
MCV: 93.7 fL (ref 78.0–100.0)
Platelets: 419 10*3/uL — ABNORMAL HIGH (ref 150–400)
RBC: 4.95 MIL/uL (ref 4.22–5.81)
RDW: 13.8 % (ref 11.5–15.5)
WBC: 7 10*3/uL (ref 4.0–10.5)

## 2016-04-24 LAB — COMPREHENSIVE METABOLIC PANEL
ALBUMIN: 4.4 g/dL (ref 3.5–5.0)
ALK PHOS: 67 U/L (ref 38–126)
ALT: 15 U/L — AB (ref 17–63)
AST: 22 U/L (ref 15–41)
Anion gap: 12 (ref 5–15)
BILIRUBIN TOTAL: 0.8 mg/dL (ref 0.3–1.2)
BUN: 6 mg/dL (ref 6–20)
CALCIUM: 9.9 mg/dL (ref 8.9–10.3)
CO2: 27 mmol/L (ref 22–32)
Chloride: 102 mmol/L (ref 101–111)
Creatinine, Ser: 0.78 mg/dL (ref 0.61–1.24)
GFR calc Af Amer: >60 mL/min (ref >60)
GFR calc non Af Amer: >60 mL/min (ref >60)
GLUCOSE: 104 mg/dL — AB (ref 65–99)
Potassium: 4.2 mmol/L (ref 3.5–5.1)
SODIUM: 141 mmol/L (ref 135–145)
TOTAL PROTEIN: 8.3 g/dL — AB (ref 6.5–8.1)

## 2016-04-24 SURGERY — CREATION, TRACHEOSTOMY
Anesthesia: General

## 2016-04-24 MED ORDER — ROCURONIUM BROMIDE 100 MG/10ML IV SOLN
INTRAVENOUS | Status: DC | PRN
  Administered 2016-04-24: 50 mg via INTRAVENOUS

## 2016-04-24 MED ORDER — PHENYLEPHRINE 40 MCG/ML (10ML) SYRINGE FOR IV PUSH (FOR BLOOD PRESSURE SUPPORT)
PREFILLED_SYRINGE | INTRAVENOUS | Status: DC | PRN
  Administered 2016-04-24: 40 ug via INTRAVENOUS
  Administered 2016-04-24 (×2): 80 ug via INTRAVENOUS

## 2016-04-24 MED ORDER — FENTANYL CITRATE (PF) 100 MCG/2ML IJ SOLN
INTRAMUSCULAR | Status: AC
  Filled 2016-04-24: qty 2

## 2016-04-24 MED ORDER — HYDROCODONE-ACETAMINOPHEN 7.5-325 MG/15ML PO SOLN
10.0000 mL | ORAL | Status: DC | PRN
  Administered 2016-04-25 – 2016-04-29 (×17): 15 mL via ORAL
  Filled 2016-04-24 (×18): qty 15

## 2016-04-24 MED ORDER — LIDOCAINE HCL (CARDIAC) 20 MG/ML IV SOLN
INTRAVENOUS | Status: DC | PRN
  Administered 2016-04-24: 20 mg via INTRAVENOUS
  Administered 2016-04-24: 30 mg via INTRAVENOUS

## 2016-04-24 MED ORDER — FENTANYL CITRATE (PF) 100 MCG/2ML IJ SOLN
50.0000 ug | INTRAMUSCULAR | Status: DC | PRN
  Administered 2016-04-24: 50 ug via INTRAVENOUS
  Administered 2016-04-24: 100 ug via INTRAVENOUS
  Administered 2016-04-25: 200 ug via INTRAVENOUS
  Administered 2016-04-25: 100 ug via INTRAVENOUS
  Administered 2016-04-25: 200 ug via INTRAVENOUS
  Administered 2016-04-25 (×2): 100 ug via INTRAVENOUS
  Administered 2016-04-25: 200 ug via INTRAVENOUS
  Filled 2016-04-24: qty 4
  Filled 2016-04-24: qty 2
  Filled 2016-04-24 (×2): qty 4
  Filled 2016-04-24 (×5): qty 2

## 2016-04-24 MED ORDER — 0.9 % SODIUM CHLORIDE (POUR BTL) OPTIME
TOPICAL | Status: DC | PRN
  Administered 2016-04-24: 1000 mL

## 2016-04-24 MED ORDER — PROMETHAZINE HCL 25 MG/ML IJ SOLN
6.2500 mg | INTRAMUSCULAR | Status: DC | PRN
Start: 2016-04-24 — End: 2016-04-24

## 2016-04-24 MED ORDER — LACTATED RINGERS IV SOLN
INTRAVENOUS | Status: DC | PRN
  Administered 2016-04-24: 13:00:00 via INTRAVENOUS

## 2016-04-24 MED ORDER — LACTATED RINGERS IV SOLN: INTRAVENOUS | Status: DC

## 2016-04-24 MED ORDER — IBUPROFEN 100 MG/5ML PO SUSP
400.0000 mg | Freq: Four times a day (QID) | ORAL | Status: DC | PRN
  Administered 2016-04-25 – 2016-04-28 (×3): 400 mg via ORAL
  Filled 2016-04-24 (×5): qty 20

## 2016-04-24 MED ORDER — ORAL CARE MOUTH RINSE
15.0000 mL | Freq: Two times a day (BID) | OROMUCOSAL | Status: DC
  Administered 2016-04-26 – 2016-04-29 (×6): 15 mL via OROMUCOSAL

## 2016-04-24 MED ORDER — MEPERIDINE HCL 25 MG/ML IJ SOLN: 6.2500 mg | INTRAMUSCULAR | Status: DC | PRN

## 2016-04-24 MED ORDER — MIDAZOLAM HCL 5 MG/5ML IJ SOLN
INTRAMUSCULAR | Status: DC | PRN
  Administered 2016-04-24: 2 mg via INTRAVENOUS

## 2016-04-24 MED ORDER — METOPROLOL TARTRATE 5 MG/5ML IV SOLN
INTRAVENOUS | Status: AC
  Administered 2016-04-24: 5 mg
  Filled 2016-04-24: qty 5

## 2016-04-24 MED ORDER — ONDANSETRON HCL 4 MG/2ML IJ SOLN
INTRAMUSCULAR | Status: AC
  Filled 2016-04-24: qty 2

## 2016-04-24 MED ORDER — SUGAMMADEX SODIUM 500 MG/5ML IV SOLN
INTRAVENOUS | Status: DC | PRN
  Administered 2016-04-24: 250 mg via INTRAVENOUS

## 2016-04-24 MED ORDER — FENTANYL CITRATE (PF) 100 MCG/2ML IJ SOLN
25.0000 ug | INTRAMUSCULAR | Status: DC | PRN
  Administered 2016-04-24 (×3): 50 ug via INTRAVENOUS

## 2016-04-24 MED ORDER — NICOTINE 14 MG/24HR TD PT24
14.0000 mg | MEDICATED_PATCH | TRANSDERMAL | Status: DC
  Administered 2016-04-24: 14 mg via TRANSDERMAL
  Filled 2016-04-24 (×3): qty 1

## 2016-04-24 MED ORDER — CHLORHEXIDINE GLUCONATE 0.12 % MT SOLN
15.0000 mL | Freq: Two times a day (BID) | OROMUCOSAL | Status: DC
  Administered 2016-04-24 – 2016-04-29 (×10): 15 mL via OROMUCOSAL
  Filled 2016-04-24 (×7): qty 15

## 2016-04-24 MED ORDER — ONDANSETRON HCL 4 MG/2ML IJ SOLN
INTRAMUSCULAR | Status: DC | PRN
  Administered 2016-04-24: 4 mg via INTRAVENOUS

## 2016-04-24 MED ORDER — LACTATED RINGERS IV SOLN
INTRAVENOUS | Status: DC
  Administered 2016-04-24: 50 mL/h via INTRAVENOUS
  Administered 2016-04-28: 22:00:00 via INTRAVENOUS

## 2016-04-24 MED ORDER — PROPOFOL 500 MG/50ML IV EMUL
INTRAVENOUS | Status: DC | PRN
  Administered 2016-04-24: 100 ug/kg/min via INTRAVENOUS

## 2016-04-24 MED ORDER — FENTANYL CITRATE (PF) 100 MCG/2ML IJ SOLN
INTRAMUSCULAR | Status: DC | PRN
  Administered 2016-04-24 (×2): 100 ug via INTRAVENOUS

## 2016-04-24 MED ORDER — EPINEPHRINE HCL (NASAL) 0.1 % NA SOLN
NASAL | Status: AC
  Filled 2016-04-24: qty 30

## 2016-04-24 MED ORDER — OXYMETAZOLINE HCL 0.05 % NA SOLN
NASAL | Status: AC
  Filled 2016-04-24: qty 15

## 2016-04-24 MED ORDER — FENTANYL CITRATE (PF) 100 MCG/2ML IJ SOLN
50.0000 ug | INTRAMUSCULAR | Status: AC
  Administered 2016-04-24: 50 ug via INTRAVENOUS

## 2016-04-24 MED ORDER — ROCURONIUM BROMIDE 50 MG/5ML IV SOSY
PREFILLED_SYRINGE | INTRAVENOUS | Status: AC
  Filled 2016-04-24: qty 5

## 2016-04-24 MED ORDER — PROPOFOL 10 MG/ML IV BOLUS
INTRAVENOUS | Status: DC | PRN
  Administered 2016-04-24: 30 mg via INTRAVENOUS
  Administered 2016-04-24: 50 mg via INTRAVENOUS
  Administered 2016-04-24: 20 mg via INTRAVENOUS

## 2016-04-24 MED ORDER — DEXTROSE-NACL 5-0.9 % IV SOLN
INTRAVENOUS | Status: DC
  Administered 2016-04-24: 75 mL/h via INTRAVENOUS
  Administered 2016-04-25 – 2016-04-26 (×3): via INTRAVENOUS

## 2016-04-24 MED ORDER — SUGAMMADEX SODIUM 500 MG/5ML IV SOLN
INTRAVENOUS | Status: AC
  Filled 2016-04-24: qty 5

## 2016-04-24 MED ORDER — LIDOCAINE 2% (20 MG/ML) 5 ML SYRINGE
INTRAMUSCULAR | Status: AC
  Filled 2016-04-24: qty 5

## 2016-04-24 MED ORDER — PROMETHAZINE HCL 25 MG PO TABS: 25.0000 mg | ORAL_TABLET | Freq: Four times a day (QID) | ORAL | Status: DC | PRN

## 2016-04-24 MED ORDER — LIDOCAINE-EPINEPHRINE 1 %-1:100000 IJ SOLN
INTRAMUSCULAR | Status: DC | PRN
  Administered 2016-04-24: 5 mL

## 2016-04-24 MED ORDER — FENTANYL CITRATE (PF) 100 MCG/2ML IJ SOLN
INTRAMUSCULAR | Status: AC
  Administered 2016-04-24: 100 ug
  Filled 2016-04-24: qty 2

## 2016-04-24 MED ORDER — MORPHINE SULFATE (PF) 2 MG/ML IV SOLN
2.0000 mg | INTRAVENOUS | Status: DC | PRN
  Administered 2016-04-24: 4 mg via INTRAVENOUS
  Administered 2016-04-26 – 2016-04-27 (×4): 2 mg via INTRAVENOUS
  Administered 2016-04-27: 4 mg via INTRAVENOUS
  Administered 2016-04-28 (×2): 2 mg via INTRAVENOUS
  Administered 2016-04-28: 4 mg via INTRAVENOUS
  Administered 2016-04-28 – 2016-04-29 (×3): 2 mg via INTRAVENOUS
  Filled 2016-04-24 (×2): qty 1
  Filled 2016-04-24: qty 2
  Filled 2016-04-24 (×3): qty 1
  Filled 2016-04-24: qty 2
  Filled 2016-04-24: qty 1
  Filled 2016-04-24: qty 2
  Filled 2016-04-24 (×3): qty 1

## 2016-04-24 MED ORDER — LABETALOL HCL 5 MG/ML IV SOLN
5.0000 mg | Freq: Once | INTRAVENOUS | Status: AC
  Administered 2016-04-24: 5 mg via INTRAVENOUS

## 2016-04-24 MED ORDER — LABETALOL HCL 5 MG/ML IV SOLN
INTRAVENOUS | Status: AC
  Filled 2016-04-24: qty 4

## 2016-04-24 MED ORDER — SUCCINYLCHOLINE CHLORIDE 200 MG/10ML IV SOSY
PREFILLED_SYRINGE | INTRAVENOUS | Status: AC
  Filled 2016-04-24: qty 10

## 2016-04-24 MED ORDER — PROMETHAZINE HCL 25 MG RE SUPP: 25.0000 mg | Freq: Four times a day (QID) | RECTAL | Status: DC | PRN

## 2016-04-24 MED ORDER — MIDAZOLAM HCL 2 MG/2ML IJ SOLN
INTRAMUSCULAR | Status: AC
  Filled 2016-04-24: qty 2

## 2016-04-24 MED ORDER — PROPOFOL 10 MG/ML IV BOLUS
INTRAVENOUS | Status: AC
  Filled 2016-04-24: qty 20

## 2016-04-24 SURGICAL SUPPLY — 36 items
BALLN PULM 15 16.5 18 X 75CM (BALLOONS)
BALLN PULM 15 16.5 18X75 (BALLOONS)
BALLOON PULM 15 16.5 18X75 (BALLOONS) IMPLANT
BLADE SURG 15 STRL LF DISP TIS (BLADE) ×2 IMPLANT
BLADE SURG 15 STRL SS (BLADE) ×2
CANISTER SUCTION 2500CC (MISCELLANEOUS) ×4 IMPLANT
COVER MAYO STAND STRL (DRAPES) ×4 IMPLANT
COVER TABLE BACK 60X90 (DRAPES) ×4 IMPLANT
DRAPE PROXIMA HALF (DRAPES) ×4 IMPLANT
GAUZE SPONGE 4X4 12PLY STRL (GAUZE/BANDAGES/DRESSINGS) ×4 IMPLANT
GAUZE SPONGE 4X4 16PLY XRAY LF (GAUZE/BANDAGES/DRESSINGS) ×4 IMPLANT
GLOVE BIOGEL PI IND STRL 6.5 (GLOVE) ×2 IMPLANT
GLOVE BIOGEL PI INDICATOR 6.5 (GLOVE) ×2
GLOVE ECLIPSE 7.5 STRL STRAW (GLOVE) ×4 IMPLANT
GUARD TEETH (MISCELLANEOUS) IMPLANT
KIT BASIN OR (CUSTOM PROCEDURE TRAY) ×4 IMPLANT
KIT ROOM TURNOVER OR (KITS) ×4 IMPLANT
MARKER SKIN DUAL TIP RULER LAB (MISCELLANEOUS) ×4 IMPLANT
NEEDLE HYPO 25GX1X1/2 BEV (NEEDLE) ×4 IMPLANT
NEEDLE PRECISIONGLIDE 27X1.5 (NEEDLE) IMPLANT
NS IRRIG 1000ML POUR BTL (IV SOLUTION) ×4 IMPLANT
PAD ARMBOARD 7.5X6 YLW CONV (MISCELLANEOUS) ×8 IMPLANT
PATTIES SURGICAL .5 X3 (DISPOSABLE) ×4 IMPLANT
SOLUTION ANTI FOG 6CC (MISCELLANEOUS) IMPLANT
SPECIMEN JAR SMALL (MISCELLANEOUS) ×4 IMPLANT
SPONGE DRAIN TRACH 4X4 STRL 2S (GAUZE/BANDAGES/DRESSINGS) ×4 IMPLANT
SUT PROLENE 2 0 CT2 30 (SUTURE) ×4 IMPLANT
SUT SILK 4 0 TIE 10X30 (SUTURE) ×4 IMPLANT
SYR CONTROL 10ML LL (SYRINGE) ×4 IMPLANT
SYR TB 1ML LUER SLIP (SYRINGE) IMPLANT
TOWEL OR 17X24 6PK STRL BLUE (TOWEL DISPOSABLE) ×4 IMPLANT
TOWEL OR 17X26 10 PK STRL BLUE (TOWEL DISPOSABLE) ×4 IMPLANT
TUBE CONNECTING 12'X1/4 (SUCTIONS) ×1
TUBE CONNECTING 12X1/4 (SUCTIONS) ×3 IMPLANT
TUBE TRACH SHILEY  6 DIST  CUF (TUBING) ×4 IMPLANT
WATER STERILE IRR 1000ML POUR (IV SOLUTION) ×4 IMPLANT

## 2016-04-24 NOTE — Transfer of Care (Signed)
Immediate Anesthesia Transfer of Care Note  Patient: Jonathan [name]  Procedure(s) Performed: Procedure(s): TRACHEOSTOMY (N/A) ESOPHAGOSCOPY LARYNGOSCOPY  Patient Location: PACU  Anesthesia Type:General  Level of Consciousness: awake, alert , patient cooperative and responds to stimulation  Airway & Oxygen Therapy: Patient Spontanous Breathing and Patient connected to T-piece oxygen  Post-op Assessment: Report given to RN, Post -op Vital signs reviewed and stable and Patient moving all extremities X 4  Post vital signs: Reviewed and stable  Last Vitals:  Vitals:   04/24/16 0929 04/24/16 0949  BP: (!) 172/111 (!) 151/107  Pulse:    Resp:    Temp:      Last Pain:  Vitals:   04/24/16 0925  TempSrc:   PainSc: 7       Patients Stated Pain Goal: 3 ([id number])  Complications: No apparent anesthesia complications

## 2016-04-24 NOTE — Progress Notes (Signed)
   Asked by Dr Constance Holster to address sbp 178/map 135  - s/p trach fresh No hx of hyperension C/o pain score level 8 of 10  On camera  - calm and no distress - vitals confirmed  A Severe hypertrensio - pain mediated either in part or full P Fentanyl 50-272mcg q2h prn till pain score < 4  Dr. Brand Males, M.D., [facility].C.P Pulmonary and Critical Care Medicine Staff Physician [place] [facility] Pager: [phone], If no answer or between  15:00h - 7:00h: call [phone]  04/24/2016 7:03 PM

## 2016-04-24 NOTE — Progress Notes (Signed)
   HR 100, RR 15, pusle ox 99%, SBP 141, MAP 113 Pain improved toi3 of 10 And bp also improved  Plan  - monitopr BP without bp meds as  Long as SBP < 160 and MAP < 125   Dr. Brand Males, M.D., [facility].C.P Pulmonary and Critical Care Medicine Staff Physician [place] [facility] Pager: [phone], If no answer or between  15:00h - 7:00h: call [phone]  04/24/2016 10:18 PM

## 2016-04-24 NOTE — Anesthesia Preprocedure Evaluation (Addendum)
Anesthesia Evaluation  Patient identified by MRN, date of birth, ID band Patient awake    Reviewed: Allergy & Precautions, NPO status , Patient's Chart, lab work & pertinent test results  Airway Mallampati: I  TM Distance: >3 FB Neck ROM: Full    Dental  (+) Missing, Poor Dentition, Dental Advisory Given   Pulmonary former smoker,    breath sounds clear to auscultation       Cardiovascular negative cardio ROS   Rhythm:Regular Rate:Normal     Neuro/Psych PSYCHIATRIC DISORDERS negative neurological ROS     GI/Hepatic Neg liver ROS, GERD  Medicated,  Endo/Other  negative endocrine ROS  Renal/GU negative Renal ROS  negative genitourinary   Musculoskeletal  (+) Arthritis , Osteoarthritis,    Abdominal   Peds negative pediatric ROS (+)  Hematology negative hematology ROS (+)   Anesthesia Other Findings   Reproductive/Obstetrics negative OB ROS                            Lab Results  Component Value Date   WBC 7.0 04/24/2016   HGB 15.5 04/24/2016   HCT 46.4 04/24/2016   MCV 93.7 04/24/2016   PLT 419 (H) 04/24/2016   Lab Results  Component Value Date   CREATININE 0.78 04/24/2016   BUN 6 04/24/2016   NA 141 04/24/2016   K 4.2 04/24/2016   CL 102 04/24/2016   CO2 27 04/24/2016   No results found for: INR, PROTIME   Anesthesia Physical Anesthesia Plan  ASA: III  Anesthesia Plan: General   Post-op Pain Management:    Induction: Intravenous  Airway Management Planned:   Additional Equipment:   Intra-op Plan: Utilization Of Total Body Hypothermia per surgeon request  Post-operative Plan: Post-operative intubation/ventilation  Informed Consent: I have reviewed the patients History and Physical, chart, labs and discussed the procedure including the risks, benefits and alternatives for the proposed anesthesia with the patient or authorized representative who has indicated  his/her understanding and acceptance.     Plan Discussed with: CRNA  Anesthesia Plan Comments:       Anesthesia Quick Evaluation

## 2016-04-24 NOTE — Progress Notes (Signed)
Paged MD Ramaswamy about patients BP, orders received to monitor and call back if outside parameters. Will continue to monitor.  Levon Hedger, RN

## 2016-04-24 NOTE — Interval H&P Note (Signed)
History and Physical Interval Note:  04/24/2016 10:57 AM  Jonathan [name]  has presented today for surgery, with the diagnosis of Laryngeal mas  The various methods of treatment have been discussed with the patient and family. After consideration of risks, benefits and other options for treatment, the patient has consented to  Procedure(s) with comments: DIRECT LARYNGOSCOPY (N/A) - with BX ESOPHAGOSCOPY (N/A) - awake trach as a surgical intervention .  The patient's history has been reviewed, patient examined, no change in status, stable for surgery.  I have reviewed the patient's chart and labs.  Questions were answered to the patient's satisfaction.     Selah Klang

## 2016-04-24 NOTE — Op Note (Signed)
OPERATIVE REPORT  DATE OF SURGERY: 04/24/2016  PATIENT:  Jonathan [name],  56 y.o. male  PRE-OPERATIVE DIAGNOSIS:  Laryngeal mas  POST-OPERATIVE DIAGNOSIS:  * No post-op diagnosis entered *  PROCEDURE:  Procedure(s): TRACHEOSTOMY ESOPHAGOSCOPY LARYNGOSCOPY  SURGEON:  Beckie Salts, MD  ASSISTANTS: None  ANESTHESIA:   Local, followed by General   EBL:  20 ml  DRAINS: None  LOCAL MEDICATIONS USED:  1% Xylocaine with epinephrine  SPECIMEN:  Right laryngeal mass  COUNTS:  Correct  PROCEDURE DETAILS: The patient was taken to the operating room and placed on the operating table in the supine position. Following induction of intravenous sedation of all the neck was prepped and draped in a standard fashion for tracheostomy. Vertical incision was outlined with a marking pen just above the sternal notch. Local anesthetic was infiltrated. The incision was opened using electrocautery and dissection was continued down through subcutaneous tissue and through the diastases of the strap muscles. The straps were reflected laterally. The isthmus of the thyroid was identified and was divided using electrocautery down to the trachea. A tracheostomy was performed between the first and second ring using a lower tracheal ring flap. The flap was sutured to the skin using 2-0 chromic suture. A #6 cuffed tracheostomy tube was placed without difficulty and secured in place with nylon suture and tracheostomy straps. The patient was then placed under general anesthesia.  Direct laryngoscopy with biopsy. An anterior commissure scope was used to evaluate the larynx. A large mass and, using the right side of the larynx and the anterior commissure area was identified. Multiple biopsies were taken. The left cord was swollen but seemed to be uninvolved. Piriforms appeared to be clear. The post cricoid area appeared clear.  Esophagoscopy. A rigid cervical esophagoscope was used to inspect the esophageal mucosa. I was  unable to pass the scope into the esophagus. I did not continue efforts as I did not want to risk perforating the pharynx. This was aborted.  The patient was awakened from anesthesia and transferred to recovery in stable condition.    PATIENT DISPOSITION:  To PACU, stable

## 2016-04-24 NOTE — Anesthesia Postprocedure Evaluation (Addendum)
Anesthesia Post Note  Patient: Jonathan [name]  Procedure(s) Performed: Procedure(s) (LRB): TRACHEOSTOMY (N/A) ESOPHAGOSCOPY LARYNGOSCOPY  Patient location during evaluation: PACU Anesthesia Type: General Level of consciousness: awake and alert Pain management: pain level controlled Vital Signs Assessment: post-procedure vital signs reviewed and stable Respiratory status: spontaneous breathing and patient connected to tracheostomy mask oxygen Cardiovascular status: blood pressure returned to baseline and stable Postop Assessment: no signs of nausea or vomiting Anesthetic complications: no Comments: BP medication administered.        Last Vitals:  Vitals:   04/24/16 1425 04/24/16 1435  BP: (!) 153/111 (!) 166/113  Pulse: (!) 111 (!) 105  Resp: 15 (!) 29  Temp: 36.4 C     Last Pain:  Vitals:   04/24/16 0925  TempSrc:   PainSc: [place] Katyana Trolinger

## 2016-04-24 NOTE — H&P (View-Only) (Signed)
Jonathan [name] is a 56 y.o. male who presents as a new  Patient.   Referring Provider: Referral, Self   Chief complaint: Throat problems.  HPI: About a year ago he developed hoarseness.  It has progressed.  He has developed right side ear pain, and difficulty swallowing.  He has lost about 10 pounds.  He drinks beer occasionally and quit smoking several months ago.  He had a CT of the neck recently.  I have reviewed the scan.  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  PMH/Meds/All/SocHx/FamHx/ROS:   Past Medical History  History reviewed. No pertinent past medical history.    Past Surgical History  History reviewed. No pertinent surgical history.    No family history of bleeding disorders, wound healing problems or difficulty with anesthesia.   Social History  Social History        Social History  . Marital status: Single    Spouse name: N/A  . Number of children: N/A  . Years of education: N/A      Occupational History  . Not on file.       Social History Main Topics  . Smoking status: Never Smoker  . Smokeless tobacco: Never Used  . Alcohol use Not on file  . Drug use: Unknown  . Sexual activity: Not on file       Other Topics Concern  . Not on file      Social History Narrative  . No narrative on file       Current Outpatient Prescriptions:  .  ESOMEPRAZOLE MAGNESIUM (NEXIUM ORAL), Take by mouth., Disp: , Rfl:   A complete ROS was performed with pertinent positives/negatives noted in the HPI. The remainder of the ROS are negative.    Physical Exam:    BP 134/90 (Site: Left arm, Position: Sitting)   Ht 1.651 m (5\' 5" )   Wt 59 kg (130 lb)   BMI 21.63 kg/m    General:  Healthy and alert, in no distress, breathing easily. Normal affect. In a pleasant mood. Head: Normocephalic, atraumatic. No masses, or scars. Eyes: Pupils are equal, and reactive to light. Vision is grossly intact. No spontaneous or gaze  nystagmus. Ears: Ear canals are clear. Tympanic membranes are intact, with normal landmarks and the middle ears are clear and healthy. Hearing: Grossly normal. Nose: Nasal cavities are clear with healthy mucosa, no polyps or exudate.Airways are patent. Face: No masses or scars, facial nerve function is symmetric. Oral Cavity: No mucosal abnormalities are noted. Tongue with normal mobility. Dentition appears healthy. Oropharynx: Tonsils are symmetric. There are no mucosal masses identified. Tongue base appears normal and healthy. Larynx/Hypopharynx: Inadequate to examine the larynx. Chest: Deferred Neck: There is a fullness in the right level 2 area, no thyroid nodules or enlargement. Neuro: Cranial nerves II-XII will normal function. Balance: Normal gate. Other findings: none.   Independent Review of Additional Tests or Records:  none  Procedures:  Procedure note:  Flexible fiberoptic laryngoscopy  Details of the procedure were explained to the patient and all questions were answered.   Procedure:    After anesthetizing the nasal cavity with topical lidocaine and oxymetazoline, the flexible endoscope was introduced and passed through the nasal cavity into the nasopharynx. The scope was then advanced to the level of the oropharynx, then the hypopharynx and larynx.  Findings:    The posterior soft palate, uvula, tongue base and vallecula were visualized and appeared healthy without mucosal masses or lesions.  There is a large mass involving the right side of the larynx, the right cord is fixed.  There may be involvement of the post cricoid area it is difficult to say for sure.   Additional findings: None  The scope was withdrawn from the nose. He tolerated the procedure well.       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  Clinically this is at least staged as T3.  The airway is significantly impaired.  He is at risk  for impending airway obstruction.  Recommend we schedule him for awake tracheostomy with direct laryngoscopy and biopsy and esophagoscopy under anesthesia immediately following that.  We discussed risks and benefits.  We discussed the possible need for laryngectomy, possible need for reconstructive surgery at [facility], possible need for chemo/radiation as primary treatment.

## 2016-04-24 NOTE — Progress Notes (Signed)
Patient ID: Jonathan [name], male   DOB: 1961-03-15, 56 y.o.   MRN: [id number] Late entry, seen on rounds about 2 hours ago.  Still in PACU. Doing well, breathing without difficulty. tracheostomy in place.  Stable postop.  Now in ICU, blood pressure is running very high. Pain medicines administered. We will consult pulmonary/CC M for assistance with antihypertensive medication.

## 2016-04-25 ENCOUNTER — Ambulatory Visit: Payer: Medicaid Other | Admitting: Radiation Oncology

## 2016-04-25 ENCOUNTER — Encounter (HOSPITAL_COMMUNITY): Payer: Self-pay | Admitting: Otolaryngology

## 2016-04-25 LAB — MRSA PCR SCREENING: MRSA by PCR: NEGATIVE

## 2016-04-25 MED ORDER — PNEUMOCOCCAL VAC POLYVALENT 25 MCG/0.5ML IJ INJ: 0.5000 mL | INJECTION | INTRAMUSCULAR | Status: DC | PRN

## 2016-04-25 MED ORDER — INFLUENZA VAC SPLIT QUAD 0.5 ML IM SUSY
0.5000 mL | PREFILLED_SYRINGE | INTRAMUSCULAR | Status: AC | PRN
  Administered 2016-04-29: 0.5 mL via INTRAMUSCULAR
  Filled 2016-04-25: qty 0.5

## 2016-04-25 NOTE — Progress Notes (Signed)
Patient ID: Jonathan [name], male   DOB: 1960-08-28, 56 y.o.   MRN: [id number] Subjective: Doing well, no specific complaints, breathing much easier.  Objective: Vital signs in last 24 hours: Temp:  [97.5 F (36.4 C)-99.9 F (37.7 C)] 99.9 F (37.7 C) (01/22 2321) Pulse Rate:  [88-111] 89 (01/23 0715) Resp:  [9-29] 17 (01/23 0715) BP: (141-194)/(90-125) 147/100 (01/23 0715) SpO2:  [97 %-100 %] 100 % (01/23 0715) Weight:  [59.2 kg (130 lb 9.6 oz)] 59.2 kg (130 lb 9.6 oz) (01/22 0915) Weight change:  Last BM Date:  (PTA)  Intake/Output from previous day: 01/22 0701 - 01/23 0700 In: 1400 [I.V.:1400] Out: 900 [Urine:900] Intake/Output this shift: Total I/O In: 75 [I.V.:75] Out: -   PHYSICAL EXAM: Awake and alert, breathing easily. Tracheostomy in place. No bleeding or drainage. Of was deflated. Minimal coughing.  Lab Results:  Recent Labs  04/24/16 0919  WBC 7.0  HGB 15.5  HCT 46.4  PLT 419*   BMET  Recent Labs  04/24/16 0919  NA 141  K 4.2  CL 102  CO2 27  GLUCOSE 104*  BUN 6  CREATININE 0.78  CALCIUM 9.9    Studies/Results: No results found.  Medications: I have reviewed the patient's current medications.  Assessment/Plan: Stable, postop day 1. We'll transferred to floor. Continue working on blood pressure control. [facility] consult hospitalist service.  LOS: 1 day   Jonathan [name] 04/25/2016, 9:03 AM

## 2016-04-25 NOTE — Care Management Note (Signed)
Case Management Note  Patient Details  Name: Jonathan [name] MRN: [id number] Date of Birth: October 30, 1960  Subjective/Objective:  Pt admitted s/p tracheostomy due to laryngeal CA              Action/Plan:   PTA independent from home with fiance [name].   Pt is planning on discharging home with trach.  Pt is currently on trach collar.  Pt confirmed that he does not have active insurance coverage - CM spoke directly with Adam from the financial counseling department at [facility] to ensure pt was to be assessed.  CM also contacted [facility] and informed of pending charity referral regarding [name], trach supplies and possible supplemental O2.  CM will continue to follow for discharge needs   Expected Discharge Date:                  Expected Discharge Plan:  [place]  In-House Referral:     Discharge planning Services  CM Consult  Post Acute Care Choice:    Choice offered to:     DME Arranged:    DME Agency:     HH Arranged:    HH Agency:     Status of Service:  In process, will continue to follow  If discussed at Long Length of Stay Meetings, dates discussed:    Additional Comments:  Maryclare Labrador, RN 04/25/2016, 2:37 PM

## 2016-04-25 NOTE — Progress Notes (Signed)
Received patient to 6n3, alert and oriented, not in any distress, on trach collar with 28% O2. Oriented patient to unit and staff. Will continue to monitor patient.

## 2016-04-26 ENCOUNTER — Encounter (HOSPITAL_COMMUNITY): Payer: Self-pay | Admitting: General Practice

## 2016-04-26 NOTE — Progress Notes (Signed)
   ENT Progress Note: POD #2 s/p Procedure(s): TRACHEOSTOMY ESOPHAGOSCOPY LARYNGOSCOPY   Subjective: Acute tracheostomy bleeding  Objective: Vital signs in last 24 hours: Temp:  [98.2 F (36.8 C)-101.9 F (38.8 C)] 100.7 F (38.2 C) (01/24 1354) Pulse Rate:  [90-117] 112 (01/24 1416) Resp:  [10-20] 18 (01/24 1416) BP: (140-168)/(89-104) 150/104 (01/24 1354) SpO2:  [93 %-100 %] 94 % (01/24 1416) FiO2 (%):  [28 %] 28 % (01/24 1416) Weight change:  Last BM Date: 04/22/16  Intake/Output from previous day: 01/23 0701 - 01/24 0700 In: 1805 [P.O.:180; I.V.:1625] Out: 825 [Urine:825] Intake/Output this shift: Total I/O In: 180 [P.O.:180] Out: -   Labs:  Recent Labs  04/24/16 0919  WBC 7.0  HGB 15.5  HCT 46.4  PLT 419*    Recent Labs  04/24/16 0919  NA 141  K 4.2  CL 102  CO2 27  GLUCOSE 104*  BUN 6  CALCIUM 9.9    Studies/Results: No results found.   PHYSICAL EXAM: #6 Shiley cuffed trach in place No active bleeding, nl resp, airway stable   Assessment/Plan: Called to patient's bedside for evaluation of acute tracheostomy bleeding. The patient underwent tracheostomy tube placement for airway management 2 days ago, he has a large supra glottic tumor. The patient had coughing and acute bleeding from his tracheostomy site, no oropharyngeal bleeding or other concern. Airway was stable throughout. Rapid response and respiratory therapy went the patient's bedside, inner cannula changed without difficulty. Patient stable, no further bleeding. Continue humidified O2 via trach mask, monitor for any additional bleeding or concerns. I will recheck later this evening.    [place], Kalub Morillo 04/26/2016, 2:56 PM

## 2016-04-26 NOTE — Care Management Note (Signed)
Case Management Note  Patient Details  Name: AMANDUS POELMAN MRN: [id number] Date of Birth: 1960-10-04  Subjective/Objective:                    Action/Plan:  Confirmed  face sheet information with patient. Patient's significant other is Scientist, research (medical)   Home address is : [address] , [place],  [place] , [place] [address]  Order suction machine , portable suction will be delivered to patient's room before discharge .   Per bedside nurse Ms Dessie Coma has started trach teaching with nurse. Called respir to arrange teaching with RT confirmed Ms Covan cell number with patient. Explained to patient respir and nursing will teaching him and Ms Dessie Coma.   Will order trach supplies and oxygen or humified air once determined which patient will be discharging with.     Expected Discharge Date:                  Expected Discharge Plan:  [place]  In-House Referral:     Discharge planning Services  CM Consult  Post Acute Care Choice:  Durable Medical Equipment, Home Health Choice offered to:  Patient  DME Arranged:  Suction DME Agency:  [place] Arranged:  RN, Speech Therapy, Respirator Therapy, Social Work [employer] Agency:  [place]  Status of Service:  In process, will continue to follow  If discussed at Long Length of Stay Meetings, dates discussed:    Additional Comments:  [name], RN 04/26/2016, 8:58 AM

## 2016-04-26 NOTE — Progress Notes (Signed)
Patient ID: Jonathan [name], male   DOB: 28-Dec-1960, 56 y.o.   MRN: [id number]  Doing well, sitting in the chair. Breathing well. He has tried eating and drinking small amounts. Tracheostomy in place.  We'll plan to change to an uncuffed tube tomorrow. Should help him be able to talk a little bit and maybe swallow a little bit better.  Pathology consistent with squamous cell carcinoma as expected. Based on the imaging, this is staged as T3 or possibly T4 based on cartilage involvement. Treatment recommendation is chemotherapy/radiation or total laryngectomy for T3, total laryngectomy for T4. I will discuss this with radiology and we will review with radiation and medical oncology. Also talk with his fiance and give her all the information update.

## 2016-04-26 NOTE — Progress Notes (Signed)
RT and rapid Response RN called to room. Whwn I arrived, patient was coughing up blood. Bleeding from around trach. No distress noted. SAT 94-96% on 28% ATC. MD paged. He told RN to ask me to change inner canula and monitor further. He said he was not coming at this time, RT will monitor as needed

## 2016-04-26 NOTE — Progress Notes (Signed)
MD called back and stated let resp therapy changed the inner cannula and continue to monitor for bleeding. If bleeding persist to paged Md. Md will come in for rounds.

## 2016-04-26 NOTE — Progress Notes (Addendum)
Called to patient room and noted patient was bleeding from his trach site. Resp therapy paged , RR paged aswell. Md called awaiting for any further instruction. Vital signs BP 166/103, HR 119, O2 sat 96%. Will continue to monitor.

## 2016-04-26 NOTE — Progress Notes (Signed)
Nutrition Follow-up  DOCUMENTATION CODES:   Not applicable  INTERVENTION:   -Downgrade diet to dysphagia 1 (puree) consistency for ease of intake  NUTRITION DIAGNOSIS:   Inadequate oral intake related to dysphagia as evidenced by meal completion < 25%.  GOAL:   Patient will meet greater than or equal to 90% of their needs  MONITOR:   PO intake, Supplement acceptance, Diet advancement, Labs, Skin, Weight trends, I & O's  REASON FOR ASSESSMENT:   Rounds    ASSESSMENT:   Pt admitted s/p tracheostomy due to laryngeal CA     Pt admitted with laryngeal mass.   S/p Procedure(s) with comments 04/24/16: DIRECT LARYNGOSCOPY (N/A) - with BX ESOPHAGOSCOPY (N/A)   Per MD notes, pathology consistent with squamous cell carcinoma as expected. Based on the imaging, this is staged as T3 or possibly T4 based on cartilage involvement. Treatment recommendation is chemotherapy/radiation or total laryngectomy for T3, total laryngectomy for T4.   RD paged by assistant nursing director on unit due to concerns swallowing. Per her report, solid foods were too difficult for pt to swallow, but tolerated applesauce. Per bedside RN, pt has dysphagia to solids, but not to liquids  Pt on trach collar. Spoke with pt and significant other at bedside. Both reports good appetite at home. Pt with minimal weigh loss over the past few months, however, pt significant other reports this is related to disease state (4.4% wt loss over the past 6 weeks).   Pt and significant other are concerned over lack of intake due to difficulty swallowing. They are amenable to try a pureed diet. Patient Excellence Coordinator also in room assisting with lunch meal order.  Nutrition-Focused physical exam completed. Findings are no fat depletion, no muscle depletion, and no edema.   Labs reviewed.   Diet Order:  DIET - DYS 1 Room service appropriate? Yes; Fluid consistency: Thin  Skin:  Reviewed, no issues  Last BM:   04/22/16  Height:   Ht Readings from Last 1 Encounters:  04/24/16 5\' 5"  (1.651 m)    Weight:   Wt Readings from Last 1 Encounters:  04/24/16 130 lb 9.6 oz (59.2 kg)    Ideal Body Weight:  61.8 kg  BMI:  Body mass index is 21.73 kg/m.  Estimated Nutritional Needs:   Kcal:  1700-1900  Protein:  80-95 grams  Fluid:  1.7-1.9  EDUCATION NEEDS:   No education needs identified at this time  Gwenette Wellons A. Jimmye Norman, RD, LDN, CDE Pager: [phone] After hours Pager: [phone]

## 2016-04-26 NOTE — Significant Event (Signed)
Rapid Response Event Note  Overview:  Called to assist with patient with bleeding at trach site Time Called: 1404 Arrival Time: 1407 Event Type: Other (Comment)  Initial Focused Assessment:  On arrival patient sitting in chair - alert - hot to touch - follows commands - no resp distress - follows commands - Nikki RT present at bedside - patient had strong cough with some bright red bleeding thru trach- and around trach with few clots noted -  cleaned - resps regular and unlabored - mildly anxious - NAD - bil BS coarse - present in all fields - abd soft - some oozing around trach site - not copious.  Bp 168/88 HR 116 RR 22 O2 sats 98% on 28% trach collar.  MD paged prior to my arrival.    Interventions:  Trach site cleaned - small ooze from around trach site - Dr. Wilburn Cornelia returned call speaking with RN Marisa Severin - orders to change inner cannula.  Inner cannula changed by Lexine Baton RT - tol well.  Gentle suction - some BRB in catheter only  - patient nods yes to feeling better.  Site cleaned - no more oozing.  Dr. Wilburn Cornelia to bedside - patient resting.  Resp status stable.    Plan of Care (if not transferred):  Contiuos O2 sat monitoring - monitor for more bleeding - RN to call as needed.   Event Summary: Name of Physician Notified: Dr. Wilburn Cornelia - on call MD at  (PTA RRT)    at    Outcome: Stayed in room and stabalized  Event End Time: 1430  Quin Hoop

## 2016-04-27 NOTE — Progress Notes (Signed)
Patient ID: Jonathan [name], male   DOB: 1960/11/08, 56 y.o.   MRN: [id number] Doing well, no complaints. Had some minor brief bleeding yesterday but none since. Tracheostomy is in place. Thick secretions. Tracheostomy changed to a #6 uncuffed. He tolerated this well. Instructions given on how to occlude the tube so that he can speak. We will start tracheostomy care teaching.

## 2016-04-27 NOTE — Care Management Note (Addendum)
Case Management Note  Patient Details  Name: ROMEN SPINNER MRN: [id number] Date of Birth: 05-28-1960  Subjective/Objective:                    Action/Plan:   Margret Covan (Other)      [phone]     At bedside aware patient will go home with 6 cuffless, with 4 cuffless and ambu bag , portable suction, discussed home health and home DME . Patient and Ms Dessie Coma reviewing booklet [facility] brought to patient earlier today . Called Nikki with [facility] . Ms Dessie Coma has appointment with RT in patient room tomorrow at 0930 am for teaching   Called  Margret Covan cell 336 [phone] , left a message with [facility]'s phone number and NCM phone number  Ordered home trach supplies will need MD signature.   Gave [facility] Therapy significant others name and phone number yesterday to arrange teaching.   At discharge patient needs to take a trach that is one siaze smaller ( 4 cuffless) and ambu bag home with him . Bedside nurse has both at bedside and patient aware.   If home oxygen needed will need order and ambulatory saturation note. Expected Discharge Date:                  Expected Discharge Plan:  [place]  In-House Referral:     Discharge planning Services  CM Consult  Post Acute Care Choice:  Durable Medical Equipment, Home Health Choice offered to:  Patient  DME Arranged:  Suction DME Agency:  [place] Arranged:  RN, Speech Therapy, Respirator Therapy, Social Work [employer] Agency:  [place]  Status of Service:  In process, will continue to follow  If discussed at Long Length of Stay Meetings, dates discussed:    Additional Comments:  [name], RN 04/27/2016, 2:10 PM

## 2016-04-28 NOTE — Care Management Note (Signed)
Case Management Note  Patient Details  Name: Jonathan [name] MRN: [id number] Date of Birth: [birth year]  Subjective/Objective:                    Action/Plan:  Spoke to patient and [name] at bedside. Trach teaching done this morning with respiratory therapy , both feel comfortable.  Expected Discharge Date:                  Expected Discharge Plan:  [place]  In-House Referral:     Discharge planning Services  CM Consult  Post Acute Care Choice:  Durable Medical Equipment, Home Health Choice offered to:  Patient  DME Arranged:  Suction DME Agency:  [place] Arranged:  RN, Speech Therapy, Respirator Therapy, Social Work [employer] Agency:  [place]  Status of Service:  In process, will continue to follow  If discussed at Long Length of Stay Meetings, dates discussed:    Additional Comments:  [name], RN 04/28/2016, 11:44 AM

## 2016-04-28 NOTE — Progress Notes (Signed)
Met with significant other and patient regarding trach education.  I had patient and Ms. Covan demonstrate how to suction and perform trach care.  They both did a reasonable job in performing the care.  Also discussed what to do in an emergency situation.  They were understanding in all aspects.  I encouraged them both to continue to perform the care while still in the hospital.  Upon my departure, there were no questions from either.  Will continue to monitor.

## 2016-04-28 NOTE — Progress Notes (Signed)
Patient ID: Jonathan [name], male   DOB: Jul 03, 1960, 56 y.o.   MRN: [id number] Doing well, no complaints. He is able to eat certain foods pretty well. He is able to speak nicely with finger occlusion. Tracheostomy in place and clear. Continue discharge planning and plan to discharge home tomorrow.

## 2016-04-29 MED ORDER — HYDROCODONE-ACETAMINOPHEN 7.5-325 MG/15ML PO SOLN: 10.0000 mL | ORAL | 0 refills | Status: DC | PRN

## 2016-04-29 MED ORDER — PROMETHAZINE HCL 25 MG RE SUPP: 25.0000 mg | Freq: Four times a day (QID) | RECTAL | 0 refills | Status: DC | PRN

## 2016-04-29 NOTE — Progress Notes (Signed)
D/c home with wife Pain 7/10 pain med administered.  VSS All rx's and d/c instructions given and explained with understanding.  All equipment and supplies have ben delivered at home as well as given here.

## 2016-04-29 NOTE — Discharge Summary (Signed)
Physician Discharge Summary  Patient ID: Jonathan [name] MRN: [id number] DOB/AGE: 09-15-1960 56 y.o. y.o.  Admit date: 04/24/2016 Discharge date: 04/29/2016  Admission Diagnoses:Laryngeal cancer  Discharge Diagnoses:  Active Problems:   Status post tracheostomy [facility])   Discharged Condition: fair  Hospital Course: No complications  Consults: none  Significant Diagnostic Studies: none  Treatments: surgery: Tracheostomy, laryngoscopy with biopsy.  Discharge Exam: Blood pressure 125/81, pulse 100, temperature 99.6 F (37.6 C), resp. rate 16, height 5\' 5"  (1.651 m), weight 59.2 kg (130 lb 9.6 oz), SpO2 97 %. PHYSICAL EXAM: Awake and alert, tracheostomy in place. No bleeding or signs of infection.  Disposition: Final discharge disposition not confirmed  Discharge Instructions    Call MD for:  difficulty breathing, headache or visual disturbances    Complete by:  As directed    Diet - low sodium heart healthy    Complete by:  As directed    Increase activity slowly    Complete by:  As directed      Allergies as of 04/29/2016   No Known Allergies     Medication List    STOP taking these medications   cetirizine 10 MG tablet Commonly known as:  ZYRTEC   esomeprazole 20 MG capsule Commonly known as:  [place] these medications   HYDROcodone-acetaminophen 7.5-325 mg/15 ml solution Commonly known as:  HYCET Take 10-15 mLs by mouth every 4 (four) hours as needed for moderate pain.   nicotine 14 mg/24hr patch Commonly known as:  NICODERM CQ - dosed in mg/24 hours Place 1 patch (14 mg total) onto the skin daily.   promethazine 25 MG suppository Commonly known as:  PHENERGAN Place 1 suppository (25 mg total) rectally every 6 (six) hours as needed for nausea.            Durable Medical Equipment        Start     Ordered   04/28/16 1430  For home use only DME Trach supplies  Once    Question Answer Comment  Trach Type Shiley   Size 6 uncuffed DIC, 6.4 mm ID,  74 mm   Cuffed or Uncuffed Uncuffed   Fenestrated No   Does patient need speaking valve? Yes   Trach Humidification Yes   Suction Needed? Yes   Manual Resuscitator Needed? Yes   Emergency Backup Trach size (one size smaller than trach in throat) Shiley 4 uncuffed      04/28/16 1430   04/27/16 1238  For home use only DME Trach supplies  Once    Comments:  Trach care kits  Question Answer Comment  Trach Type Shiley   Size 6 uncuffed DIC 6.4 mm ID, 74 mm   Cuffed or Uncuffed Uncuffed   Fenestrated No   Does patient need speaking valve? No   Trach Humidification Yes   Suction Needed? Yes   Manual Resuscitator Needed? Yes   Emergency Backup Trach size (one size smaller than trach in throat) 4 uncuffed      04/27/16 1237   04/26/16 0838  For home use only DME Suction  Once    Comments:  14 french trach suction catheters  Question:  Suction  Answer:  Lurline Idol   04/26/16 [id number]     Follow-up Information    Cherise Fedder, MD. Schedule an appointment as soon as possible for a visit in 1 week(s).   Specialty:  Otolaryngology Contact information: [address] [place] [address] [phone]  SignedIzora Gala 04/29/2016, 8:41 AM

## 2016-04-29 NOTE — Discharge Instructions (Signed)
Clean around tracheostomy as needed. Suction secretions as needed.  Appointments will be made at the cancer center, they will call you to confirm.

## 2016-04-29 NOTE — Progress Notes (Signed)
Pt BP 166/98 at 0615 rechecked BP 125/81 given pain meds and relieved fr pain.

## 2016-05-01 ENCOUNTER — Other Ambulatory Visit (HOSPITAL_COMMUNITY): Payer: Self-pay | Admitting: Otolaryngology

## 2016-05-01 ENCOUNTER — Telehealth (HOSPITAL_COMMUNITY): Payer: Self-pay | Admitting: Dentistry

## 2016-05-01 DIAGNOSIS — C329 Malignant neoplasm of larynx, unspecified: Secondary | ICD-10-CM

## 2016-05-01 NOTE — Telephone Encounter (Signed)
05/01/16 Called and left mgs. on home/mobile #'s for patient to call and schedule Dental Consult w/Dr. Enrique Sack.  LRI

## 2016-05-02 NOTE — Care Management (Signed)
  Patient discharged Saturday and did not receive [place] letter . Margaret took prescription to [facility] and filled 200 mL of medication. Now needs more.    [name] called wanting assistance with Hycet 7.5/325 mg/15 ml . [name] has prescription from Dr Constance Holster . She would [facility] use [facility] [place] , [place] , [place] phone [phone] called same pharmacy spoke with Geni Bers confirmed they are in network with [facility] program , [facility] in [facility] and aware [name] will be in with prescription. Jacqueline instructed NCM to fax [facility] letter to [facility] at [phone] .   Margaret aware of above and voiced understanding.   Magdalen Spatz RN BSN [phone]

## 2016-05-04 DIAGNOSIS — Z930 Tracheostomy status: Secondary | ICD-10-CM | POA: Diagnosis not present

## 2016-05-08 ENCOUNTER — Telehealth: Payer: Self-pay | Admitting: *Deleted

## 2016-05-08 NOTE — Telephone Encounter (Signed)
Oncology Nurse Navigator Documentation  Spoke with Mr. tymier arave, Margret, re upcoming appts.  She stated morning appts workable with her job but she needs advanced notice since she provides his transportation.  She understands I will be calling her with appts with Drs. Isidore Moos and [place].  [name], RN, BSN, [place] Oncology Nurse [place] at [place] [phone]

## 2016-05-09 ENCOUNTER — Encounter: Payer: Self-pay | Admitting: Radiation Oncology

## 2016-05-09 ENCOUNTER — Telehealth: Payer: Self-pay | Admitting: *Deleted

## 2016-05-09 NOTE — Telephone Encounter (Signed)
Oncology Nurse Navigator Documentation  Called patient's fiancee re upcoming appts, LVMM asking for return call.  [name], RN, BSN, [place] Oncology Nurse [place] at [place] [phone]

## 2016-05-09 NOTE — Telephone Encounter (Signed)
Oncology Nurse Navigator Documentation  Patient's fiancee returned my call.  I provided her the following appt information:  2/12 0830 Dental Medicine, 12:00 PET; 2/13 1030 nurse Eval, 1100 Dr. Isidore Moos, 12:15 IV Start, 1300 CT SIM; 2/14 0930 Dr. Alvy Bimler.  She understands to arrival locations/procedures for appts.  I encouraged her to call me with questions/concerns.  [name], RN, BSN, [place] Oncology Nurse [place] at [place] [phone]

## 2016-05-10 ENCOUNTER — Encounter: Payer: Self-pay | Admitting: Hematology and Oncology

## 2016-05-10 DIAGNOSIS — C321 Malignant neoplasm of supraglottis: Secondary | ICD-10-CM | POA: Insufficient documentation

## 2016-05-11 NOTE — Progress Notes (Signed)
Head and Neck Cancer Location of Tumor / Histology:  04/24/16 Diagnosis Larynx, biopsy, Right Side - POSITIVE FOR INVASIVE SQUAMOUS CELL CARCINOMA.  Patient presented  with symptoms of: Hoarse voice and odynophagia in March of 2017. He had difficulty with follow up and getting an appointment with an ENT due to insurance issues.   Biopsyof Right side of Larynx revealed:  Invasive Squamous cell carcinoma.   Nutrition Status Yes No Comments  Weight changes? [x]  []  He tells me he has lost about 10 lbs before diagnosis, but has gained weight since leaving the hospital.   Swallowing concerns? [x]  []  He is eating well, but is nervous about trying steaks. He does eat baked chicken   PEG? []  [x]     Referrals Yes No Comments  Social Work? []  [x]    Dentistry? [x]  []  Dr. Enrique Sack 05/15/16  Swallowing therapy? []  [x]  He has had ST come to his house.   Nutrition? []  [x]    Med/Onc? [x]  []  Dr. Alvy Bimler 05/17/16   Safety Issues Yes No Comments  Prior radiation? []  [x]    Pacemaker/ICD? []  [x]    Possible current pregnancy? []  [x]    Is the patient on methotrexate? []  [x]     Tobacco/Marijuana/Snuff/ETOH use: He is a former smoker (quit 12/31/15). He tells me he quit marijuana this past fall, along with alcoholic beverages.   Past/Anticipated interventions by otolaryngology, if any:  04/24/16 PROCEDURE:  Procedure(s): TRACHEOSTOMY ESOPHAGOSCOPY LARYNGOSCOPY  SURGEON:  Beckie Salts, MD  Past/Anticipated interventions by medical oncology, if any:  He has an appointment with Dr. Alvy Bimler 05/17/16.   Current Complaints / other details:   05/15/16 PET IMPRESSION: 1. Right eccentric glottic mass, appears to cross the anterior commissure with destructive findings of the thyroid cartilage. There is an upper normal size right station 2A lymph node with maximum SUV of 4.2 which is only very faintly elevated over background, but which could possibly represent a small amount of local metastatic spread.  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 2. Asymmetric calcification along the head of the right caudate nucleus and adjacent lentiform nucleus. Well this can sometimes be physiologic, there is some accompanying reduction in PET activity, and the asymmetry is an unusual feature. Accordingly I recommend MRI of the brain with and without contrast for further characterization. 3. A AP window lymph node is minimally enlarged at 1.1 cm, but with a maximum SUV below background mediastinal levels, likely benign.  BP (!) 157/97 (BP Location: Left Arm, Patient Position: Sitting, Cuff Size: Normal)   Pulse 90   Temp 99.2 F (37.3 C) (Oral)   Resp 18   Ht 5' 5.5" (1.664 m)   Wt 134 lb 6.4 oz (61 kg)   SpO2 98% Comment: room air  BMI 22.03 kg/m    Wt Readings from Last 3 Encounters:  05/16/16 134 lb 6.4 oz (61 kg)  04/24/16 130 lb 9.6 oz (59.2 kg)  04/12/16 130 lb 9.6 oz (59.2 kg)

## 2016-05-15 ENCOUNTER — Encounter (INDEPENDENT_AMBULATORY_CARE_PROVIDER_SITE_OTHER): Payer: Self-pay

## 2016-05-15 ENCOUNTER — Ambulatory Visit (HOSPITAL_COMMUNITY): Payer: Self-pay | Admitting: Dentistry

## 2016-05-15 ENCOUNTER — Encounter (HOSPITAL_COMMUNITY)
Admission: RE | Admit: 2016-05-15 | Discharge: 2016-05-15 | Disposition: A | Discharge status: Home / Self Care | Payer: Self-pay | Source: Ambulatory Visit | Attending: Otolaryngology | Admitting: Otolaryngology

## 2016-05-15 ENCOUNTER — Encounter (HOSPITAL_COMMUNITY): Payer: Self-pay | Admitting: Dentistry

## 2016-05-15 VITALS — BP 162/96 | HR 84 | Temp 98.2°F

## 2016-05-15 DIAGNOSIS — K030 Excessive attrition of teeth: Secondary | ICD-10-CM

## 2016-05-15 DIAGNOSIS — M264 Malocclusion, unspecified: Secondary | ICD-10-CM

## 2016-05-15 DIAGNOSIS — K083 Retained dental root: Secondary | ICD-10-CM

## 2016-05-15 DIAGNOSIS — K036 Deposits [accretions] on teeth: Secondary | ICD-10-CM

## 2016-05-15 DIAGNOSIS — K045 Chronic apical periodontitis: Secondary | ICD-10-CM

## 2016-05-15 DIAGNOSIS — M898X0 Other specified disorders of bone, multiple sites: Secondary | ICD-10-CM | POA: Insufficient documentation

## 2016-05-15 DIAGNOSIS — C329 Malignant neoplasm of larynx, unspecified: Secondary | ICD-10-CM | POA: Insufficient documentation

## 2016-05-15 DIAGNOSIS — K0889 Other specified disorders of teeth and supporting structures: Secondary | ICD-10-CM

## 2016-05-15 DIAGNOSIS — C321 Malignant neoplasm of supraglottis: Secondary | ICD-10-CM

## 2016-05-15 DIAGNOSIS — K0530 Chronic periodontitis, unspecified: Secondary | ICD-10-CM

## 2016-05-15 DIAGNOSIS — Z01818 Encounter for other preprocedural examination: Secondary | ICD-10-CM

## 2016-05-15 DIAGNOSIS — K029 Dental caries, unspecified: Secondary | ICD-10-CM

## 2016-05-15 DIAGNOSIS — K08409 Partial loss of teeth, unspecified cause, unspecified class: Secondary | ICD-10-CM

## 2016-05-15 DIAGNOSIS — M270 Developmental disorders of jaws: Secondary | ICD-10-CM

## 2016-05-15 DIAGNOSIS — K06020 Generalized gingival recession, unspecified: Secondary | ICD-10-CM

## 2016-05-15 DIAGNOSIS — Z930 Tracheostomy status: Secondary | ICD-10-CM

## 2016-05-15 LAB — GLUCOSE, CAPILLARY: Glucose-Capillary: 98 mg/dL (ref 65–99)

## 2016-05-15 MED ORDER — FLUDEOXYGLUCOSE F - 18 (FDG) INJECTION: 6.4000 | Freq: Once | INTRAVENOUS | Status: DC | PRN

## 2016-05-15 NOTE — Progress Notes (Signed)
DENTAL CONSULTATION  Date of Consultation:  05/15/2016 Patient Name:   Jonathan [name] Date of Birth:   01/17/1961 Medical Record Number: [id number]  VITALS: BP (!) 162/96 (BP Location: Left Arm)   Pulse 84   Temp 98.2 F (36.8 C) (Oral)   CHIEF COMPLAINT: Patient referred by Dr. Constance Holster for dental consultation.   HPI: Jonathan [name] is a 56 year old male recently diagnosed with cancer of the supraglottic larynx. Patient is status post tracheostomy. Patient with anticipated surgery followed by radiation therapy and possible chemotherapy. Patient is now seen as part of a pre-chemoradiation therapy dental protocol examination.  The patient currently denies acute toothaches, swellings, or abscesses. Patient was last seen for a dental cleaning at [facility] Department 2-3 months ago.  Patient was told he needed multiple teeth extracted at that time. Patient has no primary dentist. Patient does not go to the dentist since he cannot afford treatment. Patient denies having dental phobia. Patient denies having partial dentures.  PROBLEM LIST: Patient Active Problem List   Diagnosis Date Noted  . Laryngeal cancer ([place]) 05/10/2016    Priority: High  . Status post tracheostomy ([place]) 04/24/2016  . Hoarseness 11/21/2015  . Thrush 11/08/2015  . Tooth decay 06/03/2015  . History of colonic polyps 03/08/2015  . Tobacco use disorder 03/08/2015  . Esophageal reflux 09/12/2013  . Allergic rhinitis 09/12/2013    PMH: Past Medical History:  Diagnosis Date  . Allergic rhinitis   . Arthritis    Bilateral knee arthritis for about 20 years. Not taking any medications, believes in nature processes  . Colon polyp   . GERD (gastroesophageal reflux disease)   . HLD (hyperlipidemia)     PSH: Past Surgical History:  Procedure Laterality Date  . COLONOSCOPY  03/18/2012  . ESOPHAGOSCOPY  04/24/2016   Procedure: ESOPHAGOSCOPY;  Surgeon: Izora Gala, MD;  Location: [facility];  Service: ENT;;  .  [place]  04/24/2016   Procedure: LARYNGOSCOPY;  Surgeon: Izora Gala, MD;  Location: [facility];  Service: ENT;;  . TRACHEOSTOMY TUBE PLACEMENT N/A 04/24/2016   Procedure: TRACHEOSTOMY;  Surgeon: Izora Gala, MD;  Location: [facility];  Service: ENT;  Laterality: N/A;    ALLERGIES: No Known Allergies  MEDICATIONS: Current Outpatient Prescriptions  Medication Sig Dispense Refill  . HYDROcodone-acetaminophen (HYCET) 7.5-325 mg/15 ml solution Take 10-15 mLs by mouth every 4 (four) hours as needed for moderate pain. 473 mL 0  . nicotine (NICODERM CQ - DOSED IN MG/24 HOURS) 14 mg/24hr patch Place 1 patch (14 mg total) onto the skin daily. 30 patch 0  . promethazine (PHENERGAN) 25 MG suppository Place 1 suppository (25 mg total) rectally every 6 (six) hours as needed for nausea. 12 each 0   No current facility-administered medications for this visit.     LABS: Lab Results  Component Value Date   WBC 7.0 04/24/2016   HGB 15.5 04/24/2016   HCT 46.4 04/24/2016   MCV 93.7 04/24/2016   PLT 419 (H) 04/24/2016      Component Value Date/Time   NA 141 04/24/2016 0919   K 4.2 04/24/2016 0919   CL 102 04/24/2016 0919   CO2 27 04/24/2016 0919   GLUCOSE 104 (H) 04/24/2016 0919   BUN 6 04/24/2016 0919   CREATININE 0.78 04/24/2016 0919   CREATININE 0.98 01/09/2014 1534   CALCIUM 9.9 04/24/2016 0919   GFRNONAA >60 04/24/2016 0919   GFRNONAA 82 03/06/2011 0853   GFRAA >60 04/24/2016 0919   GFRAA >89 03/06/2011 [id number]  No results found for: INR, PROTIME No results found for: PTT  SOCIAL HISTORY: Social History   Social History  . Marital status: Single    Spouse name: N/A  . Number of children: 4  . Years of education: N/A   Occupational History  . 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 Smoking status: Former Smoker    Packs/day: 0.50    Years: 30.00    Types: Cigarettes    Quit date: 12/31/2015  . Smokeless tobacco: Never Used     Comment: not  everyday-working on stopping completely   . Alcohol use 1.2 oz/week    2 Cans of beer per week     Comment: Occassionally  . Drug use: Yes    Frequency: 2.0 times per week    Types: Marijuana     Comment: Active user. - rare use None for 2 weeks as of 04/21/16  . Sexual activity: Yes    Partners: Female     Comment: Lives with his fiance.   Other Topics Concern  . Not on file   Social History Narrative   Lives in [place] with his fiance.   Moved to [place] 11 years before. Used to live in [place] for 20 years before that.   Has 4 healthy kids from previous wife- separated now.   Is not working full-time since last 2 and half years- since early 2010.   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    FAMILY HISTORY: Family History  Problem Relation Age of Onset  . Prostate cancer Maternal Uncle 66    On radiation therapy now, diagnosed in around June 2012  . Colon cancer Maternal Uncle   . Heart attack Paternal Grandfather     Diet due to MI  . Lung cancer Paternal Aunt   . Brain cancer Paternal Aunt   . Kidney disease Maternal Uncle   . Hypertension Mother   . CAD Father   . Colon polyps Neg Hx   . Diabetes Neg Hx   . Gallbladder disease Neg Hx   . Esophageal cancer Neg Hx     REVIEW OF SYSTEMS: Reviewed With patient as per history of present illness. Psych: The patient denies having dental phobia.  DENTAL HISTORY: CHIEF COMPLAINT: Patient referred by Dr. Constance Holster for dental consultation.   HPI: Jonathan [name] is a 56 year old male recently diagnosed with cancer of the supraglottic larynx. Patient is status post tracheostomy. Patient with anticipated surgery followed by radiation therapy and possible chemotherapy. Patient is now seen as part of a pre-chemoradiation therapy dental protocol examination.  The patient currently denies acute toothaches, swellings, or abscesses. Patient was last seen for a dental cleaning at [facility] Department 2-3 months ago.  Patient was told he needed multiple teeth extracted at that time. Patient has no primary dentist. Patient does not go to the dentist since he cannot afford treatment. Patient denies having dental phobia. Patient denies having partial dentures.   DENTAL EXAMINATION: GENERAL: The patient is a well-developed, well-nourished male in no acute distress. HEAD AND NECK:  The patient has a tracheostomy in place. Patient has palpable left neck lymphadenopathy. I'm unable to palpate any right neck lymphadenopathy. INTRAORAL EXAM: The patient has normal saliva. There is no evidence of oral abscess formation. The patient has bilateral mandibular lingual tori. Patient has facial exostoses in the area of the upper right, upper left, and lower left  quadrants. DENTITION: Patient is missing tooth numbers 1, 3, 4, 7, 14-20, and 29-32.  There are retained roots in the area of tooth numbers 6 and 12.  PERIODONTAL: Patient has chronic periodontitis with plaque and calculus accumulations, gingival recession, and tooth mobility. There is moderate to severe bone loss. DENTAL CARIES/SUBOPTIMAL RESTORATIONS: Multiple dental caries are noted per dental charting form. ENDODONTIC: The patient currently denies acute pulpitis symptoms. Patient does have periapical pathology associated with tooth numbers 6, 9, and 12. CROWN AND BRIDGE: There are no crown or bridge restorations.  PROSTHODONTIC: The patient denies having partial dentures.  OCCLUSION: The patient has a poor occlusal scheme secondary to multiple missing teeth, supra-eruption and drifting of the unopposed teeth into the edentulous areas, multiple retained root segments, and lack of replacement of missing teeth with dental prostheses.  RADIOGRAPHIC INTERPRETATION: An orthopantogram was taken and supplemented with 11 periapical radiographs.  There are multiple missing teeth. There are retained root segments in the area tooth  numbers 9 and 12. There are multiple dental caries noted. There is moderate to severe bone loss. Radiographic calculus is noted. There is periapical pathology and radiolucency noted. There is evidence of mandibular anterior incisal attrition.  ASSESSMENTS: 1. 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  Pre-chemo radiation therapy dental protocol 3. Chronic apical periodontitis 4. Multiple dental caries 5. Multiple retained root segments 6. Mandibular anterior incisal attrition  7. Chronic periodontitis with bone loss 8. Accretions 9. Gingival recession 10. Tooth mobility 11. Bilateral mandibular lingual tori 12. Facial exostoses in the upper right, upper left, and lower left quadrants. 13. Multiple missing teeth 14. Poor occlusal scheme and malocclusion   PLAN/RECOMMENDATIONS: 1. I discussed the risks, benefits, and complications of various treatment options with the patient in relationship to his medical and dental conditions, anticipated radiation therapy and possible chemotherapy, and risks for chemoradiation therapy side effects to include xerostomia, radiation caries, trismus, mucositis, taste changes, gum and jawbone changes, and risk for infection and osteoradionecrosis. We discussed various treatment options to include no treatment, total and subtotal extractions with alveoloplasty, pre-prosthetic surgery as indicated, periodontal therapy, dental restorations, root canal therapy, crown and bridge therapy, implant therapy, and replacement of missing teeth as indicated. The patient currently wishes to proceed with extraction of remaining teeth with alveoloplasty and pre-prosthetic surgery as needed in the operating room with general anesthesia. Patient will then follow-up with a dentist of his choice for fabrication of upper lower complete dentures after adequate healing and approximately 3 months after the last radiation therapy has been provided.  Patient currently has a PET scan scheduled for later today followed by initial radiation therapy consultation with Dr. Isidore Moos on Tuesday.  If patient is acceptable to proceed with oral surgical procedures, we will discuss potential coordination of head and neck surgery and oral surgery as indicated. However, dental surgery may take 2-3 hours and coordination of the ENT surgery and dental surgery may not be feasible.    2. Discussion of findings with medical team and coordination of future medical and dental care as needed.  I spent in excess of  120 minutes during the conduct of this consultation and >50% of this time involved direct face-to-face encounter for counseling and/or coordination of the patient's care.     Lenn Cal, DDS

## 2016-05-15 NOTE — Patient Instructions (Signed)

## 2016-05-16 ENCOUNTER — Encounter: Payer: Self-pay | Admitting: *Deleted

## 2016-05-16 ENCOUNTER — Ambulatory Visit: Admission: RE | Admit: 2016-05-16 | Payer: Medicaid Other | Source: Ambulatory Visit

## 2016-05-16 ENCOUNTER — Ambulatory Visit
Admission: RE | Admit: 2016-05-16 | Discharge: 2016-05-16 | Disposition: A | Discharge status: Home / Self Care | Payer: Medicaid Other | Source: Ambulatory Visit | Attending: Radiation Oncology | Admitting: Radiation Oncology

## 2016-05-16 ENCOUNTER — Ambulatory Visit: Payer: Medicaid Other | Admitting: Radiation Oncology

## 2016-05-16 ENCOUNTER — Telehealth: Payer: Self-pay | Admitting: *Deleted

## 2016-05-16 ENCOUNTER — Encounter: Payer: Self-pay | Admitting: Radiation Oncology

## 2016-05-16 VITALS — BP 157/97 | HR 90 | Temp 99.2°F | Resp 18 | Ht 65.5 in | Wt 134.4 lb

## 2016-05-16 DIAGNOSIS — C329 Malignant neoplasm of larynx, unspecified: Secondary | ICD-10-CM

## 2016-05-16 DIAGNOSIS — C321 Malignant neoplasm of supraglottis: Secondary | ICD-10-CM | POA: Diagnosis not present

## 2016-05-16 DIAGNOSIS — Z79899 Other long term (current) drug therapy: Secondary | ICD-10-CM | POA: Insufficient documentation

## 2016-05-16 MED ORDER — LORAZEPAM 0.5 MG PO TABS: ORAL_TABLET | ORAL | 0 refills | Status: DC

## 2016-05-16 MED ORDER — LARYNGOSCOPY SOLUTION RAD-ONC
15.0000 mL | Freq: Once | TOPICAL | Status: AC
  Administered 2016-05-16: 15 mL via TOPICAL
  Filled 2016-05-16: qty 15

## 2016-05-16 NOTE — Progress Notes (Addendum)
Oncology Nurse Navigator Documentation  Met with Mr. Kokesh during initial consult with Dr. Isidore Moos.  He was unaccompanied.    1. Further introduced myself as his Navigator, explained my role as a member of the Care Team.   2. Provided New Patient Information packet, discussed contents:  Contact information for physician(s), myself, other members of the Care Team.  Advance Directive information ([place] blue pamphlet with LCSW contact info)  Fall Prevention Patient Safety Plan  Appointment Guideline  Financial Assistance Information sheet.  I arranged for him to see [place] following appt with Dr. Isidore Moos.  [place] with highlight of [place] 3. Provided introductory explanation of radiation treatment including SIM planning and purpose of Aquaplast head and shoulder mask, showed him example.   4. Assisted Dr. Isidore Moos with laryngoscopy. 5. Per discussion with Dr. Isidore Moos, he expressed interest in proceeding with surgery (laryngectomy) followed by RT.  Per Dr. Isidore Moos, I provided update to Dr. Constance Holster ENT. I encouraged him to contact me with questions/concerns as treatments/procedures begin.   [name], RN, BSN, [place] Oncology Nurse [place] at [place] [phone]

## 2016-05-16 NOTE — Telephone Encounter (Signed)
Oncology Nurse Navigator Documentation  Per patient's appt with Dr. Isidore Moos, called his ENT Dr. Constance Holster, [facility] with his MA Linus Orn indicating he wishes to move forward with surgery as first line of tmt, that patient expects call to arrange follow-up with Dr. Constance Holster.  [name], RN, BSN, [facility] Nurse [place] at [place] [phone]

## 2016-05-16 NOTE — Progress Notes (Signed)
Radiation Oncology         [phone] ________________________________  Initial outpatient Consultation  Name: Jonathan [name] MRN: [id number]  Date: 05/16/2016  DOB: 1960-09-16  [id number] Philipp Ovens, MD  Velna Ochs, MD   REFERRING PHYSICIAN: Velna Ochs, MD  DIAGNOSIS: Cancer Staging Malignant neoplasm of supraglottis [facility]) Staging form: Larynx - Supraglottis, AJCC 8th Edition - Clinical stage from 05/17/2016: Stage IVA (cT4a, cN1, cM0) - Signed by Eppie Gibson, MD on 05/17/2016     ICD-9-CM ICD-10-CM   1. Laryngeal cancer ([place]) 161.9 C32.9 laryngocopy solution for Rad-Onc     Fiberoptic laryngoscopy  2. Primary squamous cell carcinoma of supraglottis ([facility]) 161.1 C32.1 LORazepam (ATIVAN) 0.5 MG tablet    CHIEF COMPLAINT: Here to discuss management of voice box cancer  HISTORY OF PRESENT ILLNESS::Jonathan [name] is a 56 y.o. male who presented with hoarseness, odynophagia in March 2017.  His insurance issues delayed work up.  Subsequently, the patient saw Dr. Constance Holster who ultimately determined that the patient had an advanced laryngeal mass.  The patient had stridor and required a tracheotomy.  Biopsy of the larynx on 04-24-16  revealed: squamous cell carcinoma  Pertinent imaging thus far includes CT neck and PET performed on 05-15-16 revealing T4a supraglottic mass with thyroid cartilage invasion and a suspicious right level node node..  No obvious disant mets though MRI of brain recommended for nonspecific finding on lentiform nucleus / Right caudate nucleus   Swallowing issues, if any: only with steaks ; eats well  Weight Changes: 10lb loss before diagnosis but regaining   Other symptoms: HAs come and go, helped with OTC meds; upper respiratory coarseness; constipation, soreness in left neck/shoudler  Tobacco history, if any: quit recently  ETOH abuse, if any: quit recently  RADIATION THERAPY: No  PAST MEDICAL HISTORY:  has a past medical history of Allergic  rhinitis; Arthritis; Colon polyp; GERD (gastroesophageal reflux disease); and HLD (hyperlipidemia).    PAST SURGICAL HISTORY: Past Surgical History:  Procedure Laterality Date  . COLONOSCOPY  03/18/2012  . ESOPHAGOSCOPY  04/24/2016   Procedure: ESOPHAGOSCOPY;  Surgeon: Izora Gala, MD;  Location: [place];  Service: ENT;;  . Luther Bradley  04/24/2016   Procedure: LARYNGOSCOPY;  Surgeon: Izora Gala, MD;  Location: [place];  Service: ENT;;  . TRACHEOSTOMY TUBE PLACEMENT N/A 04/24/2016   Procedure: TRACHEOSTOMY;  Surgeon: Izora Gala, MD;  Location: [place];  Service: ENT;  Laterality: N/A;    FAMILY HISTORY: family history includes Brain cancer in his paternal aunt; CAD in his father; Colon cancer in his maternal uncle; Heart attack in his paternal grandfather; Hypertension in his mother; Kidney disease in his maternal uncle; Lung cancer in his paternal aunt; Prostate cancer (age of onset: 35) in his maternal uncle.  SOCIAL HISTORY:  reports that he quit smoking about 4 months ago. His smoking use included Cigarettes. He has a 15.00 pack-year smoking history. He has never used smokeless tobacco. He reports that he drinks about 1.2 oz of alcohol per week . He reports that he does not use drugs.  ALLERGIES: Patient has no known allergies.  MEDICATIONS:  Current Outpatient Prescriptions  Medication Sig Dispense Refill  . docusate sodium (COLACE) 100 MG capsule Take 100 mg by mouth daily as needed for mild constipation.    Marland Kitchen HYDROcodone-acetaminophen (HYCET) 7.5-325 mg/15 ml solution Take 10-15 mLs by mouth every 4 (four) hours as needed for moderate pain. 473 mL 0  . ibuprofen (ADVIL,MOTRIN) 100 MG/5ML suspension Take 400 mg by mouth.    Marland Kitchen  nicotine (NICODERM CQ - DOSED IN MG/24 HOURS) 14 mg/24hr patch Place 1 patch (14 mg total) onto the skin daily. 30 patch 0  . LORazepam (ATIVAN) 0.5 MG tablet Take 1-2 tablets 30 min before MRI and before mask fabrication to help with claustrophobia. 4 tablet 0  .  promethazine (PHENERGAN) 25 MG suppository Place 1 suppository (25 mg total) rectally every 6 (six) hours as needed for nausea. (Patient not taking: Reported on 05/15/2016) 12 each 0   No current facility-administered medications for this encounter.    Facility-Administered Medications Ordered in Other Encounters  Medication Dose Route Frequency Provider Last Rate Last Dose  . fludeoxyglucose F - 18 (FDG) injection 6.4 millicurie  6.4 millicurie Intravenous Once PRN Gilford Silvius, MD        REVIEW OF SYSTEMS:  A 10+ POINT REVIEW OF SYSTEMS WAS OBTAINED including neurology, dermatology, psychiatry, cardiac, respiratory, lymph, extremities, GI, GU, Musculoskeletal, constitutional, breasts, reproductive, HEENT.  All pertinent positives are noted in the HPI.  All others are negative.  PHYSICAL EXAM:  height is 5' 5.5" (1.664 m) and weight is 134 lb 6.4 oz (61 kg). His oral temperature is 99.2 F (37.3 C). His blood pressure is 157/97 (abnormal) and his pulse is 90. His respiration is 18 and oxygen saturation is 98%.   General: Alert and oriented, in no acute distress HEENT: Head is normocephalic. Extraocular movements are intact. Oropharynx is notable for no lesions; poor dentition noted. Neck: Neck is notable for trach in place, no obvious masses Heart: Regular in rate and rhythm with no murmurs, rubs, or gallops. Chest: coarse bilaterally  Abdomen: Soft, nontender, nondistended, with no rigidity or guarding. Extremities: No cyanosis or edema. Lymphatics: see Neck Exam Skin: No concerning lesions. Musculoskeletal: symmetric strength and muscle tone throughout. Neurologic: Cranial nerves II through XII are grossly intact. No obvious focalities. Speech is fluent. Coordination is intact. Psychiatric: Judgment and insight are intact. Affect is appropriate.  PROCEDURE NOTE: After obtaining consent and anesthetizing the nasal cavity with topical lidocaine and phenylephrine, the flexible endoscope  was introduced and passed through the nasal cavity.  A bilateral supraglottic mass appears to involve the false cords which are very swollen; a yellow nodule is anterior to the right false cord.  No obvious glottic invasion but difficult to fully visualize glottis   ECOG = 1  0 - Asymptomatic (Fully active, able to carry on all predisease activities without restriction)  1 - Symptomatic but completely ambulatory (Restricted in physically strenuous activity but ambulatory and able to carry out work of a light or sedentary nature. For example, light housework, office work)  2 - Symptomatic, <50% in bed during the day (Ambulatory and capable of all self care but unable to carry out any work activities. Up and about more than 50% of waking hours)  3 - Symptomatic, >50% in bed, but not bedbound (Capable of only limited self-care, confined to bed or chair 50% or more of waking hours)  4 - Bedbound (Completely disabled. Cannot carry on any self-care. Totally confined to bed or chair)  5 - Death   Eustace Pen MM, Creech RH, Tormey DC, et al. [phone]). "Toxicity and response criteria of the [facility]". [place] Oncol. 5 (6): 649-55   LABORATORY DATA:  Lab Results  Component Value Date   WBC 7.0 04/24/2016   HGB 15.5 04/24/2016   HCT 46.4 04/24/2016   MCV 93.7 04/24/2016   PLT 419 (H) 04/24/2016   CMP  Component Value Date/Time   NA 141 04/24/2016 0919   K 4.2 04/24/2016 0919   CL 102 04/24/2016 0919   CO2 27 04/24/2016 0919   GLUCOSE 104 (H) 04/24/2016 0919   BUN 6 04/24/2016 0919   CREATININE 0.78 04/24/2016 0919   CREATININE 0.98 01/09/2014 1534   CALCIUM 9.9 04/24/2016 0919   PROT 8.3 (H) 04/24/2016 0919   ALBUMIN 4.4 04/24/2016 0919   AST 22 04/24/2016 0919   ALT 15 (L) 04/24/2016 0919   ALKPHOS 67 04/24/2016 0919   BILITOT 0.8 04/24/2016 0919   GFRNONAA >60 04/24/2016 0919   GFRNONAA 82 03/06/2011 0853   GFRAA >60 04/24/2016 0919   GFRAA >89  03/06/2011 0853         RADIOGRAPHY reviewed by me: Ct Soft Tissue Neck W Contrast  Addendum Date: 04/18/2016   ADDENDUM REPORT: 04/18/2016 13:05 ADDENDUM: Study discussed by telephone with Dr. Philipp Ovens on 04/18/2016 at 1245 hours. Electronically Signed   By: Genevie Ann M.D.   On: 04/18/2016 13:05   Result Date: 04/18/2016 CLINICAL DATA:  56 year old male has been hoarse since March 2017. Left neck soft tissue swelling. Former smoker, quit in September. Initial encounter. EXAM: CT NECK WITH CONTRAST TECHNIQUE: Multidetector CT imaging of the neck was performed using the standard protocol following the bolus administration of intravenous contrast. CONTRAST:  66mL ISOVUE-300 IOPAMIDOL (ISOVUE-300) INJECTION 61% COMPARISON:  Chest radiographs 01/09/2014. FINDINGS: Pharynx and larynx: There is extensive soft tissue mass at the larynx with indistinct margins. The epicenter appears to be the right false cord, however, tumor appears to extend across midline both at the anterior and posterior commissure (series 3, image 66). There is tumor extension through the right anterior and possibly also into the left anterior thyroid cartilage (series 3, image 70). The right arytenoid cartilage is probably affected (image 67), and the right cricoid cartilage might also be affected. The laryngeal airway is completely effaced. Tumor does appear to marginally involved the right piriform sinus. The left piriform sinus appears spared. Overall tumor size is estimated at 35 x 31 x 31 mm (AP by transverse by CC). The lower right aryepiglottic fold is likely affected. The left 80 fold may be spared. The epiglottis is mildly thickened but felt to be spared. The hypopharynx otherwise is normal. The oropharynx and nasopharynx are within normal limits. Negative parapharyngeal spaces. Negative retropharyngeal space except for a partially retropharyngeal course of the left internal carotid artery. Salivary glands: Sublingual space, sublingual  glands, submandibular glands and parotid glands are within normal limits. Thyroid: Negative. Lymph nodes: There is a mildly enlarged and hyperenhancing right level 2 lymph node measuring up to 11 mm short axis by 23 mm long axis. See series 3, image 41 and sagittal image 15. There are small but asymmetrically conspicuous and hyperenhancing right level 2 B lymph nodes (sagittal image 12). Level 1 lymph nodes are within normal limits. The contralateral left level 2 nodes are small but there is a conspicuous 9 mm left level IIa node on series 3, image 43. The palpable area of concern on the left was marked near the left submandibular gland which appears normal. No overtly necrotic nodes. Vascular: Major vascular structures in the neck and at the skullbase are patent. Note a retropharyngeal course of the left carotid. Limited intracranial: Negative. Visualized orbits: Negative. Mastoids and visualized paranasal sinuses: Clear. Skeleton: Scattered poor dentition. Mild degenerative changes in the cervical spine. No acute or suspicious osseous lesion identified. Upper chest: No superior mediastinal lymphadenopathy.  Negative lung apices. IMPRESSION: 1. Relatively large approximately 3.5 cm laryngeal mass most compatible with laryngeal carcinoma. Epicenter at the glottis on the right. Extension across midline as well as involvement of laryngeal cartilages (possibly also bilateral). 2. Relatively small but malignant appearing right level 2 lymphadenopathy. Suspicious contralateral left level 2 lymph nodes. Electronically Signed: By: Genevie Ann M.D. On: 04/17/2016 14:47   Nm Pet Image Initial (pi) Whole Body  Result Date: 05/15/2016 CLINICAL DATA:  Initial treatment strategy for laryngeal cancer. EXAM: NUCLEAR MEDICINE PET WHOLE BODY TECHNIQUE: 6.4 mCi F-18 FDG was injected intravenously. Full-ring PET imaging was performed from the vertex to the feet after the radiotracer. CT data was obtained and used for attenuation  correction and anatomic localization. FASTING BLOOD GLUCOSE:  Value: 98 mg/dl COMPARISON:  CT scan 04/17/2016 FINDINGS: HEAD/NECK Intracranially, there is abnormal calcification along the head of the right caudate nucleus, anterior limb of the right internal capsule, and anterior portion the lentiform nucleus with some asymmetrically low PET activity in this vicinity, dedicated brain MRI with and without contrast recommended for definitive characterization. The primarily right eccentric glottic mass has a maximum SUV of 11.6, compatible with malignancy. Right station IIa lymph node measures 0.8 cm in short axis on image 53 series 3 with maximum SUV 4.2, only very faintly elevated over background. None of the other small lymph nodes are demonstrably accentuated in metabolic activity. Below the level of the laryngeal mass there is a tracheostomy tube in place. CHEST Background mediastinal blood pool activity 3.1. An AP window lymph node measuring 1.1 cm in short axis on image 87/3 has a maximum SUV of 2.9. A left axillary lymph node measuring 7 mm in short axis on image 91/3 has a maximum SUV of 3.1. ABDOMEN/PELVIS No abnormal hypermetabolic activity within the liver, pancreas, adrenal glands, or spleen. No hypermetabolic lymph nodes in the abdomen or pelvis. SKELETON No focal hypermetabolic activity to suggest skeletal metastasis. EXTREMITIES No abnormal hypermetabolic activity in the lower extremities. IMPRESSION: 1. Right eccentric glottic mass, appears to cross the anterior commissure with destructive findings of the thyroid cartilage. There is an upper normal size right station 2A lymph node with maximum SUV of 4.2 which is only very faintly elevated over background, but which could possibly represent a small amount of local metastatic spread. 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 2. Asymmetric calcification along the head of the right caudate  nucleus and adjacent lentiform nucleus. Well this can sometimes be physiologic, there is some accompanying reduction in PET activity, and the asymmetry is an unusual feature. Accordingly I recommend MRI of the brain with and without contrast for further characterization. 3. A AP window lymph node is minimally enlarged at 1.1 cm, but with a maximum SUV below background mediastinal levels, likely benign. Electronically Signed   By: Van Clines M.D.   On: 05/15/2016 14:36      IMPRESSION/PLAN:  This is a delightful patient with head and neck cancer. I recommend laryngectomy followed by radiotherapy +/- chemotherapy for this patient. We discussed a less standard approach of concurrent ChRT in lieu of surgery though this is less aggressive and likely less efficacious for curative therapy. He is intent on surgery, first.  I'll order a brain MRI due to PET finding. Pt agrees.  We also discussed that the treatment of head and neck cancer is a multidisciplinary process to maximize treatment outcomes and quality of life.  For this reasons the following referrals have been or will be made:   Medical oncology to discuss chemotherapy    Dentistry for dental evaluation, possible extractions in the radiation fields, and /or advice on reducing risk of cavities, osteoradionecrosis, or other oral issues. This might be done at time of ENT surgery.  Will make other referrals for supportive care at time of post op followup. Discussed senokot and prune juice to address his constipation.  __________________________________________   Eppie Gibson, MD

## 2016-05-16 NOTE — Progress Notes (Signed)
Please see the Nurse Progress Note in the MD Initial Consult Encounter for this patient. 

## 2016-05-17 ENCOUNTER — Other Ambulatory Visit: Payer: Self-pay | Admitting: Radiation Therapy

## 2016-05-17 ENCOUNTER — Encounter: Payer: Self-pay | Admitting: Hematology and Oncology

## 2016-05-17 ENCOUNTER — Telehealth: Payer: Self-pay | Admitting: Hematology and Oncology

## 2016-05-17 ENCOUNTER — Ambulatory Visit (HOSPITAL_BASED_OUTPATIENT_CLINIC_OR_DEPARTMENT_OTHER): Payer: Self-pay | Admitting: Hematology and Oncology

## 2016-05-17 DIAGNOSIS — Z720 Tobacco use: Secondary | ICD-10-CM

## 2016-05-17 DIAGNOSIS — F17200 Nicotine dependence, unspecified, uncomplicated: Secondary | ICD-10-CM

## 2016-05-17 DIAGNOSIS — C321 Malignant neoplasm of supraglottis: Secondary | ICD-10-CM

## 2016-05-17 DIAGNOSIS — Z930 Tracheostomy status: Secondary | ICD-10-CM

## 2016-05-17 DIAGNOSIS — G893 Neoplasm related pain (acute) (chronic): Secondary | ICD-10-CM

## 2016-05-17 MED ORDER — OXYCODONE-ACETAMINOPHEN 7.5-325 MG PO TABS: 1.0000 | ORAL_TABLET | ORAL | 0 refills | Status: DC | PRN

## 2016-05-17 MED ORDER — NICOTINE 14 MG/24HR TD PT24: 14.0000 mg | MEDICATED_PATCH | TRANSDERMAL | 0 refills | Status: DC

## 2016-05-17 MED ORDER — DOCUSATE SODIUM 100 MG PO CAPS: 100.0000 mg | ORAL_CAPSULE | Freq: Two times a day (BID) | ORAL | 9 refills | Status: DC

## 2016-05-17 MED ORDER — LORAZEPAM 0.5 MG PO TABS: ORAL_TABLET | ORAL | 0 refills | Status: DC

## 2016-05-17 MED FILL — DOK 100 MG SOFTGEL: 100 | 50 days supply | Qty: 100 | Fill #0

## 2016-05-17 MED FILL — LORazepam 0.5 MG TABS: 0.5 | 2 days supply | Qty: 4 | Fill #0

## 2016-05-17 MED FILL — OXYCODONE-APAP 7.5/325MG: 7.5-325 | 10 days supply | Qty: 60 | Fill #0

## 2016-05-17 MED FILL — NICOTINE 14 MG/24HR PATCH: 14 | 14 days supply | Qty: 14 | Fill #0

## 2016-05-17 NOTE — Telephone Encounter (Signed)
Gave patient avs report and appointments for February.  °

## 2016-05-18 DIAGNOSIS — G893 Neoplasm related pain (acute) (chronic): Secondary | ICD-10-CM | POA: Insufficient documentation

## 2016-05-18 NOTE — Assessment & Plan Note (Signed)
I reviewed the imaging study with the patient. MRI of the brain is recommended. There are nonspecific lymphadenopathy in the regional neck and mediastinum area, indeterminate. The patient has locally advanced disease. He desire possibility for laryngeal preservation of his voice. I will attempt to speak with his ENT surgeon to consider chemoradiation therapy. The patient would like that possibility. We discussed multidisciplinary approach. I will coordinate care with his dentist for dental extraction followed by port placement and feeding tube placement, provided that his MRI showed no evidence of intracranial metastasis. In the meantime, I will provide supportive care. I recommend he attends chemotherapy education class

## 2016-05-18 NOTE — Assessment & Plan Note (Signed)
I spent some time counseling the patient the importance of tobacco cessation. We discussed common strategies including nicotine patches, Tobacco Quit-line, and other nicotine replacement products to assist in hiseffort to quit  he appears motivated to quit. I refill his prescription of nicotine patch

## 2016-05-18 NOTE — Progress Notes (Signed)
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 male is here because of newly diagnosed head and neck cancer According to the patient, the first initial presentation was due to symptoms of sensation of sore throat since March 2017. He was prescribed proton pump inhibitor, antibiotics therapy and others with no significant improvement. The patient has significant smoking history, has been smoking at least half a pack to 1 pack of cigarettes per day for over 30 years, quit in September 2017. He also have history of alcoholism, quit more than 2 months ago. He currently resides at home with his significant other, Jonathan [name], for 16 years. She works as a Social research officer, government. The patient is currently not working. He has 4 adult children, 1 son and 3 daughters. He subsequently was referred to ENT and had multiple imaging studies .   Malignant neoplasm of supraglottis (Jonathan [name])   04/17/2016 Imaging    Ct neck showed relatively large approximately 3.5 cm laryngeal mass most compatible with laryngeal carcinoma. Epicenter at the glottis on the right. Extension across midline as well as involvement of laryngeal cartilages (possibly also bilateral). 2. Relatively small but malignant appearing right level 2 lymphadenopathy. Suspicious contralateral left level 2 lymph nodes.      04/21/2016 Initial Diagnosis    He saw Dr. Constance [name] in the clinic for chronic hoarseness. In the office, it was noted that the posterior soft palate, uvula, tongue base and vallecula were visualized and appeared healthy without mucosal masses or lesions. There is a large mass involving the right side of the larynx, the right cord is fixed.  There may be involvement of the post cricoid area it is difficult to say for sure. 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 Clinically this is at least staged as T3. The airway is significantly impaired. He is at risk for impending airway obstruction. ENT recommended him for awake tracheostomy with direct laryngoscopy and biopsy and esophagoscopy under anesthesia immediately following that.       04/24/2016 Pathology Results    Larynx, biopsy, Right Side - POSITIVE FOR INVASIVE SQUAMOUS CELL CARCINOMA. - SEE COMMENT. Microscopic Comment As sampled, the squamous cell carcinoma appears well differentiated. A fragment of benign cartilage is also present in the specimen.      04/24/2016 Surgery    She underwent direct laryngoscopy with biopsy. An anterior commissure scope was used to evaluate the larynx. A large mass and, using the right side of the larynx and the anterior commissure area was identified. Multiple biopsies were taken. The left cord was swollen but seemed to be uninvolved. Piriforms appeared to be clear. The post cricoid area appeared clear. Esophagoscopy was difficult and ENT surgeon was unable to pass the scope into the esophagus. He had tracheostomy      04/24/2016 Pathology Results    Larynx, biopsy, Right Side - POSITIVE FOR INVASIVE SQUAMOUS CELL CARCINOMA. - SEE COMMENT. Microscopic Comment As sampled, the squamous cell carcinoma appears well differentiated. A fragment of benign cartilage is also present in the specimen.       05/15/2016 PET scan    Right eccentric glottic mass, appears to cross the anterior commissure with destructive findings of  the thyroid cartilage. There is an upper normal size right station 2A lymph node with maximum SUV of 4.2 which is only very faintly elevated over background, but which could possibly represent a small amount of local metastatic spread. This is likely a T4a tumor and accordingly stage IVa  whether not the right station 2 lymph node is positive. 2. Asymmetric calcification along the head of the right caudate nucleus and adjacent lentiform nucleus. Well this can sometimes be physiologic, there is some accompanying reduction in PET activity, and the asymmetry is an unusual feature. Accordingly I recommend MRI of the brain with and without contrast for further characterization. 3. A AP window lymph node is minimally enlarged at 1.1 cm, but with a maximum SUV below background mediastinal levels, likely benign.       he denies any hearing deficit, difficulties with chewing food, swallowing difficulties or hemoptysis He has chronic sore throat, mild painful swallowing, changes in the quality of his voice or abnormal weight loss.  MEDICAL HISTORY:  Past Medical History:  Diagnosis Date  . Allergic rhinitis   . Arthritis    Bilateral knee arthritis for about 20 years. Not taking any medications, believes in nature processes  . Colon polyp   . GERD (gastroesophageal reflux disease)   . HLD (hyperlipidemia)     SURGICAL HISTORY: Past Surgical History:  Procedure Laterality Date  . COLONOSCOPY  03/18/2012  . ESOPHAGOSCOPY  04/24/2016   Procedure: ESOPHAGOSCOPY;  Surgeon: Jonathan Gala, MD;  Location: [facility];  Service: ENT;;  . Jonathan [name]  04/24/2016   Procedure: LARYNGOSCOPY;  Surgeon: Jonathan Gala, MD;  Location: [facility];  Service: ENT;;  . TRACHEOSTOMY TUBE PLACEMENT N/A 04/24/2016   Procedure: TRACHEOSTOMY;  Surgeon: Jonathan Gala, MD;  Location: [facility] OR;  Service: ENT;  Laterality: N/A;    SOCIAL HISTORY: Social History   Social History  . Marital status: Single    Spouse name: N/A  . Number of children: 4  . Years of education: N/A   Occupational History  . 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 Smoking status: Former Smoker    Packs/day: 0.50    Years: 30.00    Types: Cigarettes    Quit date: 12/31/2015  . Smokeless tobacco: Never Used      Comment: not everyday-working on stopping completely   . Alcohol use 1.2 oz/week    2 Cans of beer per week     Comment: he has not had a drink in 2 months  . Drug use: No     Comment: Active user. - rare use None for 2 weeks as of 04/21/16  . Sexual activity: Yes    Partners: Female     Comment: Lives with his fiance.   Other Topics Concern  . Not on file   Social History Narrative   Lives in [place] with his fiance.   Moved to [place] 11 years before. Used to live in [place] for 20 years before that.   Has 4 healthy kids from previous wife- separated now.   Is not working full-time since last 2 and half years- since early 2010.   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    FAMILY HISTORY: Family History  Problem Relation Age of Onset  . Prostate cancer Maternal Uncle 66    On radiation therapy now, diagnosed in around June 2012  . Colon cancer Maternal Uncle   . Heart attack  Paternal Grandfather     Diet due to MI  . Lung cancer Paternal Aunt   . Brain cancer Paternal Aunt   . Kidney disease Maternal Uncle   . Hypertension Mother   . CAD Father   . Colon polyps Neg Hx   . Diabetes Neg Hx   . Gallbladder disease Neg Hx   . Esophageal cancer Neg Hx     ALLERGIES:  has No Known Allergies.  MEDICATIONS:  Current Outpatient Prescriptions  Medication Sig Dispense Refill  . HYDROcodone-acetaminophen (HYCET) 7.5-325 mg/15 ml solution Take 10-15 mLs by mouth every 4 (four) hours as needed for moderate pain. 473 mL 0  . ibuprofen (ADVIL,MOTRIN) 100 MG/5ML suspension Take 400 mg by mouth.    . nicotine (NICODERM CQ - DOSED IN MG/24 HOURS) 14 mg/24hr patch Place 1 patch (14 mg total) onto the skin daily. 30 patch 0  . docusate sodium (COLACE) 100 MG capsule Take 1 capsule (100 mg total) by mouth 2 (two) times daily. 30 capsule 9  . LORazepam (ATIVAN) 0.5 MG tablet Take 1-2 tablets 30 min before MRI and before mask fabrication to help  with claustrophobia. 4 tablet 0  . oxyCODONE-acetaminophen (PERCOCET) 7.5-325 MG tablet Take 1 tablet by mouth every 4 (four) hours as needed for severe pain. 60 tablet 0  . promethazine (PHENERGAN) 25 MG suppository Place 1 suppository (25 mg total) rectally every 6 (six) hours as needed for nausea. (Patient not taking: Reported on 05/15/2016) 12 each 0   No current facility-administered medications for this visit.    Facility-Administered Medications Ordered in Other Visits  Medication Dose Route Frequency Provider Last Rate Last Dose  . fludeoxyglucose F - 18 (FDG) injection 6.4 millicurie  6.4 millicurie Intravenous Once PRN Gilford Silvius, MD        REVIEW OF SYSTEMS:   Constitutional: Denies fevers, chills or abnormal night sweats Eyes: Denies blurriness of vision, double vision or watery eyes Ears, nose, mouth, throat, and face: Denies mucositis  Respiratory: Denies cough, dyspnea or wheezes Cardiovascular: Denies palpitation, chest discomfort or lower extremity swelling Gastrointestinal:  Denies nausea, heartburn or change in bowel habits Skin: Denies abnormal skin rashes Lymphatics: Denies new lymphadenopathy or easy bruising Neurological:Denies numbness, tingling or new weaknesses Behavioral/Psych: Mood is stable, no new changes  All other systems were reviewed with the patient and are negative.  PHYSICAL EXAMINATION: ECOG PERFORMANCE STATUS: 1 - Symptomatic but completely ambulatory  Vitals:   05/17/16 0942 05/17/16 0945  BP: (!) 193/108 (!) 181/108  Pulse: 100   Resp: 18   Temp: 98.4 F (36.9 C)    Filed Weights   05/17/16 0942  Weight: 136 lb 4.8 oz (61.8 kg)    GENERAL:alert, no distress and comfortable SKIN: skin color, texture, turgor are normal, no rashes or significant lesions EYES: normal, conjunctiva are pink and non-injected, sclera clear OROPHARYNX:no exudate, no erythema and lips, buccal mucosa, and tongue normal  NECK: Tracheostomy in situ. LYMPH:   no palpable lymphadenopathy in the cervical, axillary or inguinal LUNGS: clear to auscultation and percussion with normal breathing effort HEART: regular rate & rhythm and no murmurs and no lower extremity edema ABDOMEN:abdomen soft, non-tender and normal bowel sounds Musculoskeletal:no cyanosis of digits and no clubbing  PSYCH: alert & oriented x 3 with fluent speech NEURO: no focal motor/sensory deficits  LABORATORY DATA:  I have reviewed the data as listed Lab Results  Component Value Date   WBC 7.0 04/24/2016   HGB 15.5 04/24/2016  HCT 46.4 04/24/2016   MCV 93.7 04/24/2016   PLT 419 (H) 04/24/2016   Lab Results  Component Value Date   NA 141 04/24/2016   K 4.2 04/24/2016   CL 102 04/24/2016   CO2 27 04/24/2016    RADIOGRAPHIC STUDIES: I have personally reviewed the radiological images as listed and agreed with the findings in the report. Nm Pet Image Initial (pi) Whole Body  Result Date: 05/15/2016 CLINICAL DATA:  Initial treatment strategy for laryngeal cancer. EXAM: NUCLEAR MEDICINE PET WHOLE BODY TECHNIQUE: 6.4 mCi F-18 FDG was injected intravenously. Full-ring PET imaging was performed from the vertex to the feet after the radiotracer. CT data was obtained and used for attenuation correction and anatomic localization. FASTING BLOOD GLUCOSE:  Value: 98 mg/dl COMPARISON:  CT scan 04/17/2016 FINDINGS: HEAD/NECK Intracranially, there is abnormal calcification along the head of the right caudate nucleus, anterior limb of the right internal capsule, and anterior portion the lentiform nucleus with some asymmetrically low PET activity in this vicinity, dedicated brain MRI with and without contrast recommended for definitive characterization. The primarily right eccentric glottic mass has a maximum SUV of 11.6, compatible with malignancy. Right station IIa lymph node measures 0.8 cm in short axis on image 53 series 3 with maximum SUV 4.2, only very faintly elevated over background.  None of the other small lymph nodes are demonstrably accentuated in metabolic activity. Below the level of the laryngeal mass there is a tracheostomy tube in place. CHEST Background mediastinal blood pool activity 3.1. An AP window lymph node measuring 1.1 cm in short axis on image 87/3 has a maximum SUV of 2.9. A left axillary lymph node measuring 7 mm in short axis on image 91/3 has a maximum SUV of 3.1. ABDOMEN/PELVIS No abnormal hypermetabolic activity within the liver, pancreas, adrenal glands, or spleen. No hypermetabolic lymph nodes in the abdomen or pelvis. SKELETON No focal hypermetabolic activity to suggest skeletal metastasis. EXTREMITIES No abnormal hypermetabolic activity in the lower extremities. IMPRESSION: 1. Right eccentric glottic mass, appears to cross the anterior commissure with destructive findings of the thyroid cartilage. There is an upper normal size right station 2A lymph node with maximum SUV of 4.2 which is only very faintly elevated over background, but which could possibly represent a small amount of local metastatic spread. 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 2. Asymmetric calcification along the head of the right caudate nucleus and adjacent lentiform nucleus. Well this can sometimes be physiologic, there is some accompanying reduction in PET activity, and the asymmetry is an unusual feature. Accordingly I recommend MRI of the brain with and without contrast for further characterization. 3. A AP window lymph node is minimally enlarged at 1.1 cm, but with a maximum SUV below background mediastinal levels, likely benign. Electronically Signed   By: Van Clines M.D.   On: 05/15/2016 14:36    ASSESSMENT:  Newly diagnosed squamous cell carcinoma of the Head & Neck  PLAN:  Malignant neoplasm of supraglottis ([place]) I reviewed the imaging study with the patient. MRI of the brain is recommended. There are  nonspecific lymphadenopathy in the regional neck and mediastinum area, indeterminate. The patient has locally advanced disease. He desire possibility for laryngeal preservation of his voice. I will attempt to speak with his ENT surgeon to consider chemoradiation therapy. The patient would like that possibility. We discussed multidisciplinary approach. I will coordinate care with his dentist for dental extraction  followed by port placement and feeding tube placement, provided that his MRI showed no evidence of intracranial metastasis. In the meantime, I will provide supportive care. I recommend he attends chemotherapy education class  Status post tracheostomy [facility]) He complained of minor pain. I refill his prescription pain medicine  Cancer associated pain We discussed chronic pain management. We discussed narcotic refill policy. I will switch Hycet to oral pain medicine  Tobacco use disorder I spent some time counseling the patient the importance of tobacco cessation. We discussed common strategies including nicotine patches, Tobacco Quit-line, and other nicotine replacement products to assist in hiseffort to quit  he appears motivated to quit. I refill his prescription of nicotine patch    All questions were answered. The patient knows to call the clinic with any problems, questions or concerns. I spent 60 minutes counseling the patient face to face. The total time spent in the appointment was 80 minutes and more than 50% was on counseling.     Heath Lark, MD 05/18/16 6:38 AM

## 2016-05-18 NOTE — Assessment & Plan Note (Signed)
He complained of minor pain. I refill his prescription pain medicine

## 2016-05-18 NOTE — Assessment & Plan Note (Signed)
We discussed chronic pain management. We discussed narcotic refill policy. I will switch Hycet to oral pain medicine

## 2016-05-19 ENCOUNTER — Other Ambulatory Visit: Payer: Self-pay | Admitting: Urology

## 2016-05-19 DIAGNOSIS — C321 Malignant neoplasm of supraglottis: Secondary | ICD-10-CM

## 2016-05-22 ENCOUNTER — Telehealth: Payer: Self-pay | Admitting: *Deleted

## 2016-05-22 ENCOUNTER — Ambulatory Visit (HOSPITAL_COMMUNITY): Payer: MEDICAID

## 2016-05-22 ENCOUNTER — Other Ambulatory Visit: Payer: Self-pay

## 2016-05-22 ENCOUNTER — Ambulatory Visit (HOSPITAL_COMMUNITY): Admission: RE | Admit: 2016-05-22 | Payer: Self-pay | Source: Ambulatory Visit

## 2016-05-22 NOTE — Telephone Encounter (Signed)
Oncology Nurse Navigator Documentation  Received call from patient's wife indicating he cannot make Thursday MRI.  Spoke with Laural Benes, [facility], who rescheduled MRI for 2/20 2:00 PM with 1:45 arrival.  I called patient, informed him of new appt, he confirmed his availability.  [name], RN, BSN, [place] [facility]ck Oncology Nurse [place] at [place] [phone]

## 2016-05-22 NOTE — Telephone Encounter (Signed)
Oncology Nurse Navigator Documentation  LVMM for patient indicating MRI has been rescheduled for this Thursday 2:00 PM at [facility].  Requested call back if he has any questions.  [name], RN, BSN, [place]ck Oncology Nurse [place] at [place] [phone]

## 2016-05-23 ENCOUNTER — Other Ambulatory Visit: Payer: Self-pay

## 2016-05-23 ENCOUNTER — Ambulatory Visit (HOSPITAL_COMMUNITY)
Admission: RE | Admit: 2016-05-23 | Discharge: 2016-05-23 | Disposition: A | Discharge status: Home / Self Care | Payer: Self-pay | Source: Ambulatory Visit | Attending: Radiation Oncology | Admitting: Radiation Oncology

## 2016-05-23 DIAGNOSIS — Q283 Other malformations of cerebral vessels: Secondary | ICD-10-CM | POA: Insufficient documentation

## 2016-05-23 DIAGNOSIS — G9389 Other specified disorders of brain: Secondary | ICD-10-CM | POA: Insufficient documentation

## 2016-05-23 DIAGNOSIS — G319 Degenerative disease of nervous system, unspecified: Secondary | ICD-10-CM | POA: Insufficient documentation

## 2016-05-23 MED ORDER — GADOBENATE DIMEGLUMINE 529 MG/ML IV SOLN
10.0000 mL | Freq: Once | INTRAVENOUS | Status: AC | PRN
  Administered 2016-05-23: 10 mL via INTRAVENOUS

## 2016-05-24 ENCOUNTER — Telehealth: Payer: Self-pay | Admitting: *Deleted

## 2016-05-24 ENCOUNTER — Other Ambulatory Visit (HOSPITAL_COMMUNITY): Payer: Self-pay | Admitting: Dentistry

## 2016-05-24 ENCOUNTER — Telehealth: Payer: Self-pay | Admitting: Hematology and Oncology

## 2016-05-24 NOTE — Telephone Encounter (Addendum)
Oncology Nurse Navigator Documentation  Spoke with Jonathan [name], informed chemo Ed scheduled for tomorrow has been rescheduled for 2/28 12:00 to be followed by appt with Jonathan [name] 1:30.  He indicated availability to attend [facility] [place] next Tuesday, 2/27, with an arrival time of 8:00.  I provided registration and arrival information.  Per Jonathan [name], informed him results of yesterday's brain MRI did not show evidence of metastatic disease.  He voiced appreciation for the good news.  [name], RN, BSN, [place] Neck Oncology Nurse [place] at [place] [phone]

## 2016-05-24 NOTE — Telephone Encounter (Signed)
lvm to inform pt of r/s chemo class to 2/22 at 1230 pm per LOS

## 2016-05-24 NOTE — Telephone Encounter (Signed)
lvm to inform pt of r/s chemo class appt on 2/22 per second LOS. Gave pt new appt date/time per NG 2/28 at 12 pm

## 2016-05-25 ENCOUNTER — Ambulatory Visit (HOSPITAL_COMMUNITY): Admission: RE | Admit: 2016-05-25 | Payer: MEDICAID | Source: Ambulatory Visit

## 2016-05-25 ENCOUNTER — Other Ambulatory Visit: Payer: Self-pay

## 2016-05-25 ENCOUNTER — Other Ambulatory Visit: Payer: Self-pay | Admitting: *Deleted

## 2016-05-25 ENCOUNTER — Encounter (HOSPITAL_COMMUNITY): Payer: Self-pay | Admitting: *Deleted

## 2016-05-25 DIAGNOSIS — C329 Malignant neoplasm of larynx, unspecified: Secondary | ICD-10-CM

## 2016-05-25 NOTE — Progress Notes (Signed)
Pt denies any acute cardiopulmonary issues. Pt denies being under the care of a cardiologist. Pt denies having a stress test, echo and cardiac cath. Pt denies having an EKG and chest x ray within the last year. Pt made aware to stop taking Aspirin, vitamins, fish oil and herbal medications. Do not take any NSAIDs ie: Ibuprofen, Advil, Naproxen, BC and Goody Powder or any medication containing Aspirin. Pt verbalized understanding of all pre-op instructions. Spoke with Willeen Cass, NP, Anesthesia regarding pt tracheostomy; no new orders given.

## 2016-05-25 NOTE — Anesthesia Preprocedure Evaluation (Addendum)
Anesthesia Evaluation  Patient identified by MRN, date of birth, ID band Patient awake    Reviewed: Allergy & Precautions, NPO status , Patient's Chart, lab work & pertinent test results  History of Anesthesia Complications Negative for: history of anesthetic complications  Airway Mallampati: Trach   Neck ROM: Full    Dental  (+) Dental Advisory Given, Poor Dentition   Pulmonary former smoker,  Laryngeal cancer: trach   breath sounds clear to auscultation       Cardiovascular negative cardio ROS   Rhythm:Regular Rate:Normal     Neuro/Psych negative neurological ROS     GI/Hepatic Neg liver ROS, GERD  Controlled,  Endo/Other  negative endocrine ROS  Renal/GU negative Renal ROS     Musculoskeletal  (+) Arthritis , Osteoarthritis,    Abdominal   Peds  Hematology negative hematology ROS (+)   Anesthesia Other Findings   Reproductive/Obstetrics                            Anesthesia Physical Anesthesia Plan  ASA: III  Anesthesia Plan: General   Post-op Pain Management:    Induction: Intravenous  Airway Management Planned: Tracheostomy  Additional Equipment:   Intra-op Plan:   Post-operative Plan: Extubation in OR  Informed Consent: I have reviewed the patients History and Physical, chart, labs and discussed the procedure including the risks, benefits and alternatives for the proposed anesthesia with the patient or authorized representative who has indicated his/her understanding and acceptance.     Plan Discussed with: CRNA and Surgeon  Anesthesia Plan Comments: (Plan routine monitors, GETA in stoma)        Anesthesia Quick Evaluation

## 2016-05-26 ENCOUNTER — Ambulatory Visit (HOSPITAL_COMMUNITY)
Admission: RE | Admit: 2016-05-26 | Discharge: 2016-05-26 | Disposition: A | Discharge status: Home / Self Care | Payer: Medicaid Other | Source: Ambulatory Visit | Attending: Dentistry | Admitting: Dentistry

## 2016-05-26 ENCOUNTER — Encounter (HOSPITAL_COMMUNITY): Payer: Self-pay | Admitting: *Deleted

## 2016-05-26 ENCOUNTER — Ambulatory Visit (HOSPITAL_COMMUNITY): Payer: Medicaid Other | Admitting: Certified Registered Nurse Anesthetist

## 2016-05-26 ENCOUNTER — Encounter (HOSPITAL_COMMUNITY)
Admission: RE | Disposition: A | Discharge status: Home / Self Care | Payer: Self-pay | Source: Ambulatory Visit | Attending: Dentistry

## 2016-05-26 ENCOUNTER — Telehealth: Payer: Self-pay | Admitting: *Deleted

## 2016-05-26 ENCOUNTER — Ambulatory Visit (HOSPITAL_COMMUNITY): Payer: MEDICAID

## 2016-05-26 DIAGNOSIS — Z930 Tracheostomy status: Secondary | ICD-10-CM | POA: Diagnosis not present

## 2016-05-26 DIAGNOSIS — E785 Hyperlipidemia, unspecified: Secondary | ICD-10-CM | POA: Insufficient documentation

## 2016-05-26 DIAGNOSIS — G893 Neoplasm related pain (acute) (chronic): Secondary | ICD-10-CM | POA: Insufficient documentation

## 2016-05-26 DIAGNOSIS — K045 Chronic apical periodontitis: Secondary | ICD-10-CM

## 2016-05-26 DIAGNOSIS — K0889 Other specified disorders of teeth and supporting structures: Secondary | ICD-10-CM | POA: Diagnosis not present

## 2016-05-26 DIAGNOSIS — K083 Retained dental root: Secondary | ICD-10-CM

## 2016-05-26 DIAGNOSIS — M898X8 Other specified disorders of bone, other site: Secondary | ICD-10-CM | POA: Diagnosis not present

## 2016-05-26 DIAGNOSIS — C321 Malignant neoplasm of supraglottis: Secondary | ICD-10-CM

## 2016-05-26 DIAGNOSIS — M898X0 Other specified disorders of bone, multiple sites: Secondary | ICD-10-CM

## 2016-05-26 DIAGNOSIS — Z87891 Personal history of nicotine dependence: Secondary | ICD-10-CM | POA: Insufficient documentation

## 2016-05-26 DIAGNOSIS — K219 Gastro-esophageal reflux disease without esophagitis: Secondary | ICD-10-CM | POA: Diagnosis not present

## 2016-05-26 DIAGNOSIS — M270 Developmental disorders of jaws: Secondary | ICD-10-CM

## 2016-05-26 DIAGNOSIS — M278 Other specified diseases of jaws: Secondary | ICD-10-CM

## 2016-05-26 DIAGNOSIS — K0530 Chronic periodontitis, unspecified: Secondary | ICD-10-CM

## 2016-05-26 DIAGNOSIS — M170 Bilateral primary osteoarthritis of knee: Secondary | ICD-10-CM | POA: Diagnosis not present

## 2016-05-26 DIAGNOSIS — K029 Dental caries, unspecified: Secondary | ICD-10-CM

## 2016-05-26 HISTORY — DX: Headache: R51

## 2016-05-26 HISTORY — DX: Malignant (primary) neoplasm, unspecified: C80.1

## 2016-05-26 HISTORY — DX: Headache, unspecified: R51.9

## 2016-05-26 HISTORY — DX: Dental caries, unspecified: K02.9

## 2016-05-26 HISTORY — PX: MULTIPLE EXTRACTIONS WITH ALVEOLOPLASTY: SHX5342

## 2016-05-26 LAB — BASIC METABOLIC PANEL
Anion gap: 9 (ref 5–15)
BUN: 6 mg/dL (ref 6–20)
CO2: 27 mmol/L (ref 22–32)
CREATININE: 0.93 mg/dL (ref 0.61–1.24)
Calcium: 9.1 mg/dL (ref 8.9–10.3)
Chloride: 103 mmol/L (ref 101–111)
Glucose, Bld: 100 mg/dL — ABNORMAL HIGH (ref 65–99)
Potassium: 3.6 mmol/L (ref 3.5–5.1)
SODIUM: 139 mmol/L (ref 135–145)

## 2016-05-26 LAB — CBC
HCT: 37.9 % — ABNORMAL LOW (ref 39.0–52.0)
Hemoglobin: 12.3 g/dL — ABNORMAL LOW (ref 13.0–17.0)
MCH: 29.8 pg (ref 26.0–34.0)
MCHC: 32.5 g/dL (ref 30.0–36.0)
MCV: 91.8 fL (ref 78.0–100.0)
Platelets: 310 K/uL (ref 150–400)
RBC: 4.13 MIL/uL — ABNORMAL LOW (ref 4.22–5.81)
RDW: 14.3 % (ref 11.5–15.5)
WBC: 7.1 K/uL (ref 4.0–10.5)

## 2016-05-26 SURGERY — MULTIPLE EXTRACTION WITH ALVEOLOPLASTY
Anesthesia: General

## 2016-05-26 MED ORDER — CEFAZOLIN SODIUM-DEXTROSE 2-4 GM/100ML-% IV SOLN
2.0000 g | Freq: Once | INTRAVENOUS | Status: AC
  Administered 2016-05-26: 2 g via INTRAVENOUS

## 2016-05-26 MED ORDER — OXYCODONE-ACETAMINOPHEN 7.5-325 MG PO TABS: 1.0000 | ORAL_TABLET | ORAL | 0 refills | Status: DC | PRN

## 2016-05-26 MED ORDER — LIDOCAINE-EPINEPHRINE 2 %-1:100000 IJ SOLN
INTRAMUSCULAR | Status: AC
  Filled 2016-05-26: qty 6.8

## 2016-05-26 MED ORDER — METOPROLOL TARTARATE 1 MG/ML SYRINGE (5ML)
Status: DC | PRN
  Administered 2016-05-26 (×2): 2.5 mg via INTRAVENOUS

## 2016-05-26 MED ORDER — ARTIFICIAL TEARS OP OINT
TOPICAL_OINTMENT | OPHTHALMIC | Status: AC
  Filled 2016-05-26: qty 3.5

## 2016-05-26 MED ORDER — HYDROMORPHONE HCL 1 MG/ML IJ SOLN
INTRAMUSCULAR | Status: DC | PRN
  Administered 2016-05-26: .2 mg via INTRAVENOUS
  Administered 2016-05-26: .1 mg via INTRAVENOUS
  Administered 2016-05-26: .2 mg via INTRAVENOUS
  Administered 2016-05-26 (×2): .25 mg via INTRAVENOUS

## 2016-05-26 MED ORDER — PHENYLEPHRINE HCL 10 MG/ML IJ SOLN
INTRAVENOUS | Status: DC | PRN
  Administered 2016-05-26: 10:00:00 via INTRAVENOUS
  Administered 2016-05-26: 50 ug/min via INTRAVENOUS

## 2016-05-26 MED ORDER — PROPOFOL 10 MG/ML IV BOLUS
INTRAVENOUS | Status: AC
  Filled 2016-05-26: qty 20

## 2016-05-26 MED ORDER — 0.9 % SODIUM CHLORIDE (POUR BTL) OPTIME
TOPICAL | Status: DC | PRN
  Administered 2016-05-26: 1000 mL

## 2016-05-26 MED ORDER — PROPOFOL 10 MG/ML IV BOLUS
INTRAVENOUS | Status: DC | PRN
  Administered 2016-05-26: 200 mg via INTRAVENOUS

## 2016-05-26 MED ORDER — MIDAZOLAM HCL 2 MG/2ML IJ SOLN: 0.5000 mg | Freq: Once | INTRAMUSCULAR | Status: DC | PRN

## 2016-05-26 MED ORDER — FENTANYL CITRATE (PF) 100 MCG/2ML IJ SOLN
INTRAMUSCULAR | Status: AC
  Filled 2016-05-26: qty 2

## 2016-05-26 MED ORDER — OXYCODONE-ACETAMINOPHEN 5-325 MG PO TABS
1.0000 | ORAL_TABLET | Freq: Once | ORAL | Status: AC
  Administered 2016-05-26: 1 via ORAL

## 2016-05-26 MED ORDER — METOPROLOL TARTRATE 5 MG/5ML IV SOLN
INTRAVENOUS | Status: AC
  Filled 2016-05-26: qty 5

## 2016-05-26 MED ORDER — GLYCOPYRROLATE 0.2 MG/ML IJ SOLN
INTRAMUSCULAR | Status: DC | PRN
  Administered 2016-05-26: 0.1 mg via INTRAVENOUS

## 2016-05-26 MED ORDER — OXYMETAZOLINE HCL 0.05 % NA SOLN
NASAL | Status: AC
  Filled 2016-05-26: qty 15

## 2016-05-26 MED ORDER — HEMOSTATIC AGENTS (NO CHARGE) OPTIME
TOPICAL | Status: DC | PRN
  Administered 2016-05-26: 1 via TOPICAL

## 2016-05-26 MED ORDER — FENTANYL CITRATE (PF) 100 MCG/2ML IJ SOLN
INTRAMUSCULAR | Status: DC | PRN
  Administered 2016-05-26 (×2): 50 ug via INTRAVENOUS

## 2016-05-26 MED ORDER — HYDROMORPHONE HCL 1 MG/ML IJ SOLN: 0.2500 mg | INTRAMUSCULAR | Status: DC | PRN

## 2016-05-26 MED ORDER — MIDAZOLAM HCL 2 MG/2ML IJ SOLN
INTRAMUSCULAR | Status: AC
  Filled 2016-05-26: qty 2

## 2016-05-26 MED ORDER — OXYCODONE-ACETAMINOPHEN 5-325 MG PO TABS: 1.0000 | ORAL_TABLET | ORAL | Status: DC | PRN

## 2016-05-26 MED ORDER — LIDOCAINE-EPINEPHRINE 2 %-1:100000 IJ SOLN
INTRAMUSCULAR | Status: AC
  Filled 2016-05-26: qty 10.2

## 2016-05-26 MED ORDER — MEPERIDINE HCL 25 MG/ML IJ SOLN: 6.2500 mg | INTRAMUSCULAR | Status: DC | PRN

## 2016-05-26 MED ORDER — ONDANSETRON HCL 4 MG/2ML IJ SOLN
INTRAMUSCULAR | Status: DC | PRN
  Administered 2016-05-26: 4 mg via INTRAVENOUS

## 2016-05-26 MED ORDER — LIDOCAINE-EPINEPHRINE 2 %-1:100000 IJ SOLN
INTRAMUSCULAR | Status: DC | PRN
  Administered 2016-05-26: 10.2 mL via INTRADERMAL

## 2016-05-26 MED ORDER — PROMETHAZINE HCL 25 MG/ML IJ SOLN: 6.2500 mg | INTRAMUSCULAR | Status: DC | PRN

## 2016-05-26 MED ORDER — LACTATED RINGERS IV SOLN: INTRAVENOUS | Status: DC

## 2016-05-26 MED ORDER — BUPIVACAINE-EPINEPHRINE 0.5% -1:200000 IJ SOLN
INTRAMUSCULAR | Status: DC | PRN
  Administered 2016-05-26: 3.6 mL

## 2016-05-26 MED ORDER — SUCCINYLCHOLINE CHLORIDE 200 MG/10ML IV SOSY
PREFILLED_SYRINGE | INTRAVENOUS | Status: AC
  Filled 2016-05-26: qty 10

## 2016-05-26 MED ORDER — MIDAZOLAM HCL 2 MG/2ML IJ SOLN
INTRAMUSCULAR | Status: DC | PRN
  Administered 2016-05-26 (×2): 1 mg via INTRAVENOUS

## 2016-05-26 MED ORDER — ARTIFICIAL TEARS OP OINT
TOPICAL_OINTMENT | OPHTHALMIC | Status: DC | PRN
  Administered 2016-05-26: 1 via OPHTHALMIC

## 2016-05-26 MED ORDER — AMINOCAPROIC ACID SOLUTION 5% (50 MG/ML)
10.0000 mL | ORAL | Status: DC
  Filled 2016-05-26: qty 100

## 2016-05-26 MED ORDER — ONDANSETRON HCL 4 MG/2ML IJ SOLN
INTRAMUSCULAR | Status: AC
  Filled 2016-05-26: qty 2

## 2016-05-26 MED ORDER — LIDOCAINE 2% (20 MG/ML) 5 ML SYRINGE
INTRAMUSCULAR | Status: AC
  Filled 2016-05-26: qty 5

## 2016-05-26 MED ORDER — LIDOCAINE HCL (CARDIAC) 20 MG/ML IV SOLN
INTRAVENOUS | Status: DC | PRN
  Administered 2016-05-26: 100 mg via INTRATRACHEAL

## 2016-05-26 MED ORDER — BUPIVACAINE-EPINEPHRINE (PF) 0.5% -1:200000 IJ SOLN
INTRAMUSCULAR | Status: AC
  Filled 2016-05-26: qty 3.6

## 2016-05-26 MED ORDER — HYDROMORPHONE HCL 1 MG/ML IJ SOLN
INTRAMUSCULAR | Status: AC
  Filled 2016-05-26: qty 1

## 2016-05-26 MED ORDER — CEFAZOLIN SODIUM-DEXTROSE 2-4 GM/100ML-% IV SOLN
INTRAVENOUS | Status: AC
  Filled 2016-05-26: qty 100

## 2016-05-26 MED ORDER — OXYCODONE-ACETAMINOPHEN 5-325 MG PO TABS
ORAL_TABLET | ORAL | Status: AC
  Administered 2016-05-26: 1 via ORAL
  Filled 2016-05-26: qty 1

## 2016-05-26 MED ORDER — LACTATED RINGERS IV SOLN
INTRAVENOUS | Status: DC | PRN
  Administered 2016-05-26 (×2): via INTRAVENOUS

## 2016-05-26 MED FILL — OXYCODON-ACETAMINOPHEN 7.5-: 7.5-325 | 7 days supply | Qty: 42 | Fill #0

## 2016-05-26 SURGICAL SUPPLY — 38 items
ALCOHOL 70% 16 OZ (MISCELLANEOUS) ×3 IMPLANT
APPLICATOR COTTON TIP 6IN STRL (MISCELLANEOUS) ×6 IMPLANT
ATTRACTOMAT 16X20 MAGNETIC DRP (DRAPES) ×3 IMPLANT
BLADE SURG 15 STRL LF DISP TIS (BLADE) ×2 IMPLANT
BLADE SURG 15 STRL SS (BLADE) ×4
COVER SURGICAL LIGHT HANDLE (MISCELLANEOUS) ×3 IMPLANT
GAUZE PACKING FOLDED 2  STR (GAUZE/BANDAGES/DRESSINGS) ×2
GAUZE PACKING FOLDED 2 STR (GAUZE/BANDAGES/DRESSINGS) ×1 IMPLANT
GAUZE SPONGE 4X4 16PLY XRAY LF (GAUZE/BANDAGES/DRESSINGS) ×3 IMPLANT
GLOVE BIOGEL PI IND STRL 6 (GLOVE) ×1 IMPLANT
GLOVE BIOGEL PI INDICATOR 6 (GLOVE) ×2
GLOVE SURG ORTHO 8.0 STRL STRW (GLOVE) ×3 IMPLANT
GLOVE SURG SS PI 6.0 STRL IVOR (GLOVE) ×3 IMPLANT
GOWN STRL REUS W/ TWL LRG LVL3 (GOWN DISPOSABLE) ×1 IMPLANT
GOWN STRL REUS W/TWL 2XL LVL3 (GOWN DISPOSABLE) ×3 IMPLANT
GOWN STRL REUS W/TWL LRG LVL3 (GOWN DISPOSABLE) ×2
HEMOSTAT SURGICEL 2X14 (HEMOSTASIS) ×3 IMPLANT
HOLDER TRACH TUBE VELCRO 19.5 (MISCELLANEOUS) ×3 IMPLANT
KIT BASIN OR (CUSTOM PROCEDURE TRAY) ×3 IMPLANT
KIT ROOM TURNOVER OR (KITS) ×3 IMPLANT
MANIFOLD NEPTUNE WASTE (CANNULA) ×3 IMPLANT
NEEDLE BLUNT 16X1.5 OR ONLY (NEEDLE) ×3 IMPLANT
NS IRRIG 1000ML POUR BTL (IV SOLUTION) ×3 IMPLANT
PACK EENT II TURBAN DRAPE (CUSTOM PROCEDURE TRAY) ×3 IMPLANT
PAD ARMBOARD 7.5X6 YLW CONV (MISCELLANEOUS) ×3 IMPLANT
SPONGE SURGIFOAM ABS GEL 100 (HEMOSTASIS) IMPLANT
SPONGE SURGIFOAM ABS GEL 12-7 (HEMOSTASIS) IMPLANT
SPONGE SURGIFOAM ABS GEL SZ50 (HEMOSTASIS) ×3 IMPLANT
SUCTION FRAZIER HANDLE 10FR (MISCELLANEOUS) ×2
SUCTION TUBE FRAZIER 10FR DISP (MISCELLANEOUS) ×1 IMPLANT
SUT CHROMIC 3 0 PS 2 (SUTURE) ×6 IMPLANT
SUT CHROMIC 4 0 P 3 18 (SUTURE) IMPLANT
SYR 50ML SLIP (SYRINGE) ×3 IMPLANT
TOWEL OR 17X26 10 PK STRL BLUE (TOWEL DISPOSABLE) ×3 IMPLANT
TUBE CONNECTING 12'X1/4 (SUCTIONS) ×1
TUBE CONNECTING 12X1/4 (SUCTIONS) ×2 IMPLANT
WATER TABLETS ICX (MISCELLANEOUS) ×3 IMPLANT
YANKAUER SUCT BULB TIP NO VENT (SUCTIONS) ×3 IMPLANT

## 2016-05-26 NOTE — Discharge Instructions (Signed)

## 2016-05-26 NOTE — Transfer of Care (Signed)
Immediate Anesthesia Transfer of Care Note  Patient: DELAYNE PETRA  Procedure(s) Performed: Procedure(s): Extraction of tooth #'s 2,5,6,8-13, 21-28 with alveoloplasty, bilateral mandibular tori reductions and buccal exostoses reductions of maxillary left, maxillary right, and mandibular left quadrants. (N/A)  Patient Location: PACU  Anesthesia Type:General  Level of Consciousness: awake, alert  and patient cooperative  Airway & Oxygen Therapy: Patient Spontanous Breathing and Patient connected to tracheostomy mask oxygen  Post-op Assessment: Report given to RN, Post -op Vital signs reviewed and stable and Patient moving all extremities  Post vital signs: Reviewed and stable  Last Vitals:  Vitals:   05/26/16 0604 05/26/16 0957  BP: (!) 191/138 (!) 161/104  Pulse: 96 90  Resp: 18 14  Temp: 37.2 C 36.6 C    Last Pain:  Vitals:   05/26/16 0957  TempSrc:   PainSc: Asleep      Patients Stated Pain Goal: 4 ([id number])  Complications: No apparent anesthesia complications

## 2016-05-26 NOTE — Telephone Encounter (Signed)
Oncology Nurse Navigator Documentation  Received call from [place] [phone]).  She indicated ENT Dr. Constance Holster had referred Jonathan [name] to [facility] for SLP services, she asked about treatment plan.    I answered her questions, provided tentative start date of 3/5 for induction chemo to be followed by RT at later date.  I explained Jonathan [name] had been referred for SLP through [facility] as well.  She indicated she will likely see him for a couple of weeks after which his care can be transitioned to [facility] clinician.    She noted will keep me informed of his [facility] status.   [name], RN, BSN, [place] Neck Oncology Nurse [place] at [place] [phone]

## 2016-05-26 NOTE — H&P (Signed)
05/26/2016  Patient:            Jonathan [name] Date of Birth:  12-Jun-1960 MRN:                [id number]   BP (!) 191/138 Comment: Jonathan Billow, RN aware  Pulse 96   Temp 98.9 F (37.2 C) (Oral)   Resp 18   Ht 5' 5.5" (1.664 m)   Wt 136 lb 5 oz (61.8 kg)   SpO2 97%   BMI 22.34 kg/m   Jonathan [name] presents for multiple dental extractions with alveoloplasty and pre-prosthetic surgery in the OR with general anesthesia thru trach site.  Patient denies acute medical or dental changes. Please see note from Jonathan [name] dated 05/17/16 to act as H and P for the dental OR procedure.   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 male is here because of newly diagnosed head and neck cancer According to the patient, the first initial presentation was due to symptoms of sensation of sore throat since March 2017. He was prescribed proton pump inhibitor, antibiotics therapy and others with no significant improvement. The patient has significant smoking history, has been smoking at least half a pack to 1 pack of cigarettes per day for over 30 years, quit in September 2017. He also have history of alcoholism, quit more than 2 months ago. He currently resides at home with his significant other, Jonathan [name], for 16 years. She works as a Social research officer, government. The patient is currently not working. He has 4 adult children, 1 son and 3 daughters. He subsequently was referred to ENT and had multiple imaging studies .        Malignant neoplasm of supraglottis (Jonathan [name])   04/17/2016 Imaging    Ct neck showed relatively large approximately 3.5 cm laryngeal mass most compatible with laryngeal carcinoma. Epicenter at the glottis on the right. Extension across midline as well as involvement of laryngeal cartilages (possibly also bilateral). 2. Relatively small but malignant appearing right level 2 lymphadenopathy. Suspicious contralateral left level 2 lymph nodes.      04/21/2016 Initial Diagnosis    He saw Jonathan [name] in the clinic for chronic hoarseness. In the office, it was noted that the posterior soft palate, uvula, tongue base and vallecula were visualized and appeared healthy without mucosal masses or lesions. There is a large mass involving the right side of the larynx, the right cord is fixed. There may be involvement of the post cricoid area it is difficult to say for sure. 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 Clinically this is at least staged as T3. The airway is significantly impaired. He is at risk for impending airway obstruction. ENT recommended him for awake tracheostomy with direct laryngoscopy and biopsy and esophagoscopy under anesthesia immediately following that.       04/24/2016 Pathology Results    Larynx, biopsy, Right Side - POSITIVE FOR INVASIVE SQUAMOUS CELL CARCINOMA. - SEE COMMENT. Microscopic Comment As sampled, the  squamous cell carcinoma appears well differentiated. A fragment of benign cartilage is also present in the specimen.      04/24/2016 Surgery    She underwent direct laryngoscopy with biopsy. An anterior commissure scope was used to evaluate the larynx. A large mass and, using the right side of the larynx and the anterior commissure area was identified. Multiple biopsies were taken. The left cord was swollen but seemed to be uninvolved. Piriforms appeared to be clear. The post cricoid area appeared clear. Esophagoscopy  was difficult and ENT surgeon was unable to pass the scope into the esophagus. He had tracheostomy      04/24/2016 Pathology Results    Larynx, biopsy, Right Side - POSITIVE FOR INVASIVE SQUAMOUS CELL CARCINOMA. - SEE COMMENT. Microscopic Comment As sampled, the squamous cell carcinoma appears well differentiated. A fragment of benign cartilage is also present in the specimen.       05/15/2016 PET scan    Right eccentric glottic mass, appears to cross the anterior commissure with destructive findings of the thyroid cartilage. There is an upper normal size right station 2A lymph node with maximum SUV of 4.2 which is only very faintly elevated over background, but which could possibly represent a small amount of local metastatic spread. 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 2. Asymmetric calcification along the head of the right caudate nucleus and adjacent lentiform nucleus. Well this can sometimes be physiologic, there is some accompanying reduction in PET activity, and the asymmetry is an unusual feature. Accordingly I recommend MRI of the brain with and without contrast for further characterization. 3. A AP window lymph node is minimally enlarged at 1.1 cm, but with a maximum SUV below background mediastinal levels, likely benign.       he denies any hearing deficit, difficulties with chewing food, swallowing difficulties or hemoptysis He has chronic sore throat, mild painful swallowing, changes in the quality of his voice or abnormal weight loss.  MEDICAL HISTORY:      Past Medical History:  Diagnosis Date  . Allergic rhinitis   . Arthritis    Bilateral knee arthritis for about 20 years. Not taking any medications, believes in nature processes  . Colon polyp   . GERD (gastroesophageal reflux disease)   . HLD (hyperlipidemia)     SURGICAL HISTORY:      Past Surgical History:  Procedure  Laterality Date  . COLONOSCOPY  03/18/2012  . ESOPHAGOSCOPY  04/24/2016   Procedure: ESOPHAGOSCOPY;  Surgeon: Jonathan Gala, MD;  Location: [facility];  Service: ENT;;  . Jonathan [name]  04/24/2016   Procedure: LARYNGOSCOPY;  Surgeon: Jonathan Gala, MD;  Location: [facility];  Service: ENT;;  . TRACHEOSTOMY TUBE PLACEMENT N/A 04/24/2016   Procedure: TRACHEOSTOMY;  Surgeon: Jonathan Gala, MD;  Location: [facility] OR;  Service: ENT;  Laterality: N/A;    SOCIAL HISTORY: Social History        Social History  . Marital status: Single    Spouse name: N/A  . Number of children: 4  . Years of education: N/A       Occupational History  . 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 Smoking status: Former Smoker    Packs/day: 0.50    Years: 30.00    Types: Cigarettes    Quit date: 12/31/2015  . Smokeless tobacco: Never Used     Comment: not  everyday-working on stopping completely   . Alcohol use 1.2 oz/week    2 Cans of beer per week     Comment: he has not had a drink in 2 months  . Drug use: No     Comment: Active user. - rare use None for 2 weeks as of 04/21/16  . Sexual activity: Yes    Partners: Female     Comment: Lives with his fiance.       Other Topics Concern  . Not on file      Social History Narrative   Lives in [place] with his fiance.   Moved to [place] 11 years before. Used to live in [place] for 20 years before that.   Has 4 healthy kids from previous wife- separated now.   Is not working full-time since last 2 and half years- since early 2010.   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    FAMILY HISTORY:       Family History  Problem Relation Age of Onset  . Prostate cancer Maternal Uncle 66    On radiation therapy now, diagnosed in around June 2012  . Colon cancer Maternal Uncle   . Heart attack Paternal Grandfather     Diet due to MI  . Lung cancer  Paternal Aunt   . Brain cancer Paternal Aunt   . Kidney disease Maternal Uncle   . Hypertension Mother   . CAD Father   . Colon polyps Neg Hx   . Diabetes Neg Hx   . Gallbladder disease Neg Hx   . Esophageal cancer Neg Hx     ALLERGIES:  has No Known Allergies.  MEDICATIONS:        Current Outpatient Prescriptions  Medication Sig Dispense Refill  . HYDROcodone-acetaminophen (HYCET) 7.5-325 mg/15 ml solution Take 10-15 mLs by mouth every 4 (four) hours as needed for moderate pain. 473 mL 0  . ibuprofen (ADVIL,MOTRIN) 100 MG/5ML suspension Take 400 mg by mouth.    . nicotine (NICODERM CQ - DOSED IN MG/24 HOURS) 14 mg/24hr patch Place 1 patch (14 mg total) onto the skin daily. 30 patch 0  . docusate sodium (COLACE) 100 MG capsule Take 1 capsule (100 mg total) by mouth 2 (two) times daily. 30 capsule 9  . LORazepam (ATIVAN) 0.5 MG tablet Take 1-2 tablets 30 min before MRI and before mask fabrication to help with claustrophobia. 4 tablet 0  . oxyCODONE-acetaminophen (PERCOCET) 7.5-325 MG tablet Take 1 tablet by mouth every 4 (four) hours as needed for severe pain. 60 tablet 0  . promethazine (PHENERGAN) 25 MG suppository Place 1 suppository (25 mg total) rectally every 6 (six) hours as needed for nausea. (Patient not taking: Reported on 05/15/2016) 12 each 0   No current facility-administered medications for this visit.             Facility-Administered Medications Ordered in Other Visits  Medication Dose Route Frequency Provider Last Rate Last Dose  . fludeoxyglucose F - 18 (FDG) injection 6.4 millicurie  6.4 millicurie Intravenous Once PRN Gilford Silvius, MD        REVIEW OF SYSTEMS:   Constitutional: Denies fevers, chills or abnormal night sweats Eyes: Denies blurriness of vision, double vision or watery eyes Ears, nose, mouth, throat, and face: Denies mucositis  Respiratory: Denies cough, dyspnea or wheezes Cardiovascular: Denies palpitation, chest discomfort  or lower extremity swelling Gastrointestinal:  Denies nausea, heartburn or change in bowel habits Skin:  Denies abnormal skin rashes Lymphatics: Denies new lymphadenopathy or easy bruising Neurological:Denies numbness, tingling or new weaknesses Behavioral/Psych: Mood is stable, no new changes  All other systems were reviewed with the patient and are negative.  PHYSICAL EXAMINATION: ECOG PERFORMANCE STATUS: 1 - Symptomatic but completely ambulatory      Vitals:   05/17/16 0942 05/17/16 0945  BP: (!) 193/108 (!) 181/108  Pulse: 100   Resp: 18   Temp: 98.4 F (36.9 C)       Filed Weights   05/17/16 0942  Weight: 136 lb 4.8 oz (61.8 kg)    GENERAL:alert, no distress and comfortable SKIN: skin color, texture, turgor are normal, no rashes or significant lesions EYES: normal, conjunctiva are pink and non-injected, sclera clear OROPHARYNX:no exudate, no erythema and lips, buccal mucosa, and tongue normal  NECK: Tracheostomy in situ. LYMPH:  no palpable lymphadenopathy in the cervical, axillary or inguinal LUNGS: clear to auscultation and percussion with normal breathing effort HEART: regular rate & rhythm and no murmurs and no lower extremity edema ABDOMEN:abdomen soft, non-tender and normal bowel sounds Musculoskeletal:no cyanosis of digits and no clubbing  PSYCH: alert & oriented x 3 with fluent speech NEURO: no focal motor/sensory deficits  LABORATORY DATA:  I have reviewed the data as listed [facility]       Lab Results  Component Value Date   WBC 7.0 04/24/2016   HGB 15.5 04/24/2016   HCT 46.4 04/24/2016   MCV 93.7 04/24/2016   PLT 419 (H) 04/24/2016     [facility]       Lab Results  Component Value Date   NA 141 04/24/2016   K 4.2 04/24/2016   CL 102 04/24/2016   CO2 27 04/24/2016      RADIOGRAPHIC STUDIES: I have personally reviewed the radiological images as listed and agreed with the findings in the report.  ImagingResults    Nm Pet Image Initial (pi) Whole Body  Result Date: 05/15/2016 CLINICAL DATA:  Initial treatment strategy for laryngeal cancer. EXAM: NUCLEAR MEDICINE PET WHOLE BODY TECHNIQUE: 6.4 mCi F-18 FDG was injected intravenously. Full-ring PET imaging was performed from the vertex to the feet after the radiotracer. CT data was obtained and used for attenuation correction and anatomic localization. FASTING BLOOD GLUCOSE:  Value: 98 mg/dl COMPARISON:  CT scan 04/17/2016 FINDINGS: HEAD/NECK Intracranially, there is abnormal calcification along the head of the right caudate nucleus, anterior limb of the right internal capsule, and anterior portion the lentiform nucleus with some asymmetrically low PET activity in this vicinity, dedicated brain MRI with and without contrast recommended for definitive characterization. The primarily right eccentric glottic mass has a maximum SUV of 11.6, compatible with malignancy. Right station IIa lymph node measures 0.8 cm in short axis on image 53 series 3 with maximum SUV 4.2, only very faintly elevated over background. None of the other small lymph nodes are demonstrably accentuated in metabolic activity. Below the level of the laryngeal mass there is a tracheostomy tube in place. CHEST Background mediastinal blood pool activity 3.1. An AP window lymph node measuring 1.1 cm in short axis on image 87/3 has a maximum SUV of 2.9. A left axillary lymph node measuring 7 mm in short axis on image 91/3 has a maximum SUV of 3.1. ABDOMEN/PELVIS No abnormal hypermetabolic activity within the liver, pancreas, adrenal glands, or spleen. No hypermetabolic lymph nodes in the abdomen or pelvis. SKELETON No focal hypermetabolic activity to suggest skeletal metastasis. EXTREMITIES No abnormal hypermetabolic activity in the lower extremities. IMPRESSION:  1. Right eccentric glottic mass, appears to cross the anterior commissure with destructive findings of the thyroid cartilage. There is an upper normal  size right station 2A lymph node with maximum SUV of 4.2 which is only very faintly elevated over background, but which could possibly represent a small amount of local metastatic spread. 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 2. Asymmetric calcification along the head of the right caudate nucleus and adjacent lentiform nucleus. Well this can sometimes be physiologic, there is some accompanying reduction in PET activity, and the asymmetry is an unusual feature. Accordingly I recommend MRI of the brain with and without contrast for further characterization. 3. A AP window lymph node is minimally enlarged at 1.1 cm, but with a maximum SUV below background mediastinal levels, likely benign. Electronically Signed   By: Van Clines M.D.   On: 05/15/2016 14:36     ASSESSMENT:  Newly diagnosed squamous cell carcinoma of the Head & Neck  PLAN:  Malignant neoplasm of supraglottis ([place]) I reviewed the imaging study with the patient. MRI of the brain is recommended. There are nonspecific lymphadenopathy in the regional neck and mediastinum area, indeterminate. The patient has locally advanced disease. He desire possibility for laryngeal preservation of his voice. I will attempt to speak with his ENT surgeon to consider chemoradiation therapy. The patient would like that possibility. We discussed multidisciplinary approach. I will coordinate care with his dentist for dental extraction followed by port placement and feeding tube placement, provided that his MRI showed no evidence of intracranial metastasis. In the meantime, I will provide supportive care. I recommend he attends chemotherapy education class  Status post tracheostomy [facility]) He complained of minor pain. I refill his prescription pain medicine  Cancer associated pain We discussed chronic pain management. We discussed narcotic refill policy. I will switch Hycet  to oral pain medicine  Tobacco use disorder I spent some time counseling the patient the importance of tobacco cessation. We discussed common strategies including nicotine patches, Tobacco Quit-line, and other nicotine replacement products to assist in hiseffort to quit  he appears motivated to quit. I refill his prescription of nicotine patch    All questions were answered. The patient knows to call the clinic with any problems, questions or concerns. I spent 60 minutes counseling the patient face to face. The total time spent in the appointment was 80 minutes and more than 50% was on counseling.     Jonathan Lark, MD 05/18/16 6:38 AM       Electronically signed by Jonathan Lark, MD at 05/18/2016 6:41 AM

## 2016-05-26 NOTE — Progress Notes (Signed)
PRE-OPERATIVE NOTE:  05/26/2016 [name] [id number]  VITALS: BP (!) 191/138 Comment: Janett Billow, RN aware  Pulse 96   Temp 98.9 F (37.2 C) (Oral)   Resp 18   Ht 5' 5.5" (1.664 m)   Wt 136 lb 5 oz (61.8 kg)   SpO2 97%   BMI 22.34 kg/m   Lab Results  Component Value Date   WBC 7.1 05/26/2016   HGB 12.3 (L) 05/26/2016   HCT 37.9 (L) 05/26/2016   MCV 91.8 05/26/2016   PLT 310 05/26/2016   BMET    Component Value Date/Time   NA 139 05/26/2016 0627   K 3.6 05/26/2016 0627   CL 103 05/26/2016 0627   CO2 27 05/26/2016 0627   GLUCOSE 100 (H) 05/26/2016 0627   BUN 6 05/26/2016 0627   CREATININE 0.93 05/26/2016 0627   CREATININE 0.98 01/09/2014 1534   CALCIUM 9.1 05/26/2016 0627   GFRNONAA >60 05/26/2016 0627   GFRNONAA 82 03/06/2011 0853   GFRAA >60 05/26/2016 0627   GFRAA >89 03/06/2011 0853    No results found for: INR, PROTIME No results found for: PTT   [name] presents for multiple dental extractions with alveoloplasty and pre-prosthetic surgery as needed in the operating room with general anesthesia.   SUBJECTIVE: The patient denies any acute medical or dental changes and agrees to proceed with treatment as planned.  EXAM: No sign of acute dental changes.  ASSESSMENT: Patient is affected by chronic apical periodontitis, retained root segments, dental caries, chronic periodontitis, loose teeth, bilateral mandibular lingual tori, and lateral exostoses.  PLAN: Patient agrees to proceed with treatment as planned in the operating room as previously discussed and accepts the risks, benefits, and complications of the proposed treatment. Patient is aware of the risk for bleeding, bruising, swelling, infection, pain, nerve damage, soft tissue damage, sinus involvement, root tip fracture, mandible fracture, and the risks of complications associated with the anesthesia. Patient also is aware of the potential for other complications up to and including death due to  his overall medical and respiratory compromise.   Lenn Cal, DDS

## 2016-05-26 NOTE — Op Note (Addendum)
OPERATIVE REPORT  Patient:            Jonathan [name] Date of Birth:  11-18-1960 MRN:                [id number]   DATE OF PROCEDURE:  05/26/2016  PREOPERATIVE DIAGNOSES: 1. Cancer of the supraglottic larynx 2. Pre-chemoradiation therapy dental protocol 3. Chronic apical periodontitis 4. Dental caries 5. Chronic periodontitis 6. Loose teeth 7. Bilateral mandibular lingual tori 8. Multiple lateral exostoses  POSTOPERATIVE DIAGNOSES: 1. Cancer of the supraglottic larynx 2. Pre-chemoradiation therapy dental protocol 3. Chronic apical periodontitis 4. Dental caries 5. Chronic periodontitis 6. Loose teeth 7. Bilateral mandibular lingual tori 8. Multiple lateral exostoses  OPERATIONS: 1. Multiple extraction of tooth numbers 2, 5, 6, 8-13, and  21-28  2. 4 Quadrants of alveoloplasty 3. Bilateral mandibular lingual tori reductions 4. Maxillary right, maxillary left, and mandibular left buccal exostoses reductions   SURGEON: Lenn Cal, DDS  ASSISTANT: Camie Patience, (dental assistant)  ANESTHESIA: General anesthesia via  tracheostomy site.   MEDICATIONS: 1. Ancef 2 g IV prior to invasive dental procedures. 2. Local anesthesia with a total utilization of 6 carpules each containing 34 mg of lidocaine with 0.017 mg of epinephrine as well as 2 carpules each containing 9 mg of bupivacaine with 0.009 mg of epinephrine.  SPECIMENS: There are 17 teeth that were discarded.  DRAINS: None  CULTURES: None  COMPLICATIONS: None   ESTIMATED BLOOD LOSS: 150 mLs.  INTRAVENOUS FLUIDS: 1200 mLs of Lactated ringers solution.  INDICATIONS: The patient was recently diagnosed with cancer of the supraglottic larynx. The patient is status post tracheostomy placement.  A dental consultation was then requested to evaluate poor dentition as part of a pre-chemoradiation therapy dental protocol.  The patient was examined and treatment planned for extraction of remaining teeth with  alveoloplasty and pre-prosthetic surgery as needed in the operating room with general anesthesia.  This treatment plan was formulated to decrease the risks and complications associated with dental infection from affecting the patient's systemic health and to prevent future complications such as osteoradionecrosis. This treatment plan would also assist in future prosthodontic rehabilitation for the patient.  OPERATIVE FINDINGS: Patient was examined operating room number 12.  The teeth were identified for extraction. The patient was noted be affected by chronic periodontitis,loose teeth , retained root segments, chronic apical periodontitis, dental caries, bilateral mandibular lingual tori, and multiple lateral exostoses.   DESCRIPTION OF PROCEDURE: Patient was brought to the main operating room number 12. Patient was then placed in the supine position on the operating table. General  anesthesia was then induced per the anesthesia team. The patient was then prepped and draped in the usual manner for dental medicine procedure. A timeout was performed. The patient was identified and procedures were verified. A throat pack was placed at this time. The oral cavity was then thoroughly examined with the findings noted above. The patient was then ready for dental medicine procedure as follows:  Local anesthesia was then administered sequentially with a total utilization of  6 carpules each containing 34 mg of lidocaine with 0.017 mg of epinephrine as well as 2 carpules  each containing 9 mg bupivacaine with 0.009 mg of epinephrine.  The Maxillary left and right quadrants were first approached. Anesthesia was then delivered utilizing infiltration with lidocaine with epinephrine. A #15 blade incision was then made from the the maxillary right tuberosity and extended to the maxillary left tuberosity.  A  surgical flap was then  carefully reflected. Appropriate amounts of buccal and interseptal bone were then removed  utilizing a surgical handpiece and bur and copious amounts of sterile water around tooth numbers 2, 5, 6, 11, 12, and 13.  The teeth were then subluxated with a series of straight elevators. Tooth numbers 2, 5, 6, 8, 9, 10, 11, 12, and 13 were then removed with a 150 forceps without complications. Alveoloplasty was then performed utilizing a ronguers and bone file.  The buccal flaps were then reflected to expose the buccal exostoses in the area of tooth numbers 2 through 5 and in the maxillary left quadrant in the area of tooth numbers 14 through 16. The buccal exostoses were then removed utilizing a rongeurs and bone file. The surgical sites were then irrigated with copious amounts of sterile saline. The tissues were approximated and trimmed appropriately. The  maxillary right surgical site was then closed from the maxillary right tuberosity and extended the mesial #8 utilizing 3-0 chromic gut suture in a continuous interrupted suture technique 1. The maxillary left surgical site was then closed from the maxillary left tuberosity and extended the mesial #9 utilizing 3-0 chromic gut suture in a continuous interrupted suture technique 1. 2 individual interrupted sutures then placed the surgical site is debrided utilizing 3-0 chromic gut material.  At this point time, the mandibular quadrants were approached. The patient was given bilateral inferior alveolar nerve blocks and long buccal nerve blocks utilizing the bupivacaine with epinephrine. Further infiltration was then achieved utilizing the lidocaine with epinephrine. A 15 blade incision was then made from the distal of number 17 and extended to the distal of #31. Surgical flaps were then carefully reflected.  The lower teeth were then subluxated with a series of straight elevators. Appropriate amounts of buccal and interseptal bone were then removed utilizing a surgical handpiece and copious amount of sterile water around tooth numbers 21, 22, 27, and 28.   Lower teeth were then again subluxated with a series of straight elevators. Tooth numbers 20-28 were then removed with a 151 forceps without complications. Alveoloplasty was then performed utilizing a rongeurs and bone file.  At this point time the surgical flaps were then further reflected to expose the bilateral mandibular lingual tori and mandibular left buccal exostoses.  The bilateral mandibular lingual tori and mandibular left buccal exostoses were then reduced utilizing a surgical handpiece and bur and copious amounts of sterile water. Further alveoloplasty was performed utilizing a rongeurs and bone file. The tissues were approximated and trimmed appropriately. The surgical sites were then irrigated with copious amounts of sterile saline 4 . The mandibular left surgical site was then closed from the distal of #17 and extended the mesial #24 utilizing 3-0 chromic gut suture in a continuous interrupted suture technique 1.  The mandibular right surgical site was then closed from the distal of 31 and extended to the mesial #25 utilizing 3-0 chromic gut suture in a continuous interrupted suture technique x1. 3 interrupted sutures were then placed further closed surgical site utilizing 3-0 chromic gut material.  At this point time, the entire mouth was irrigated with copious amounts of sterile saline. The patient was examined for complications, seeing none, the dental medicine procedure was deemed to be complete. The throat pack was removed at this time. A series of 4 x 4 gauze  moistened with Amicar 5% rinse were placed in the mouth to aid hemostasis. The patient was then handed over to the anesthesia team for final disposition. After an appropriate  amount of time, the patient had his general anesthesia reversed and tracheostomy site was returned to his preop condition.  The patient was then taken to the postanesthsia care unit in good condition. All counts were correct for the dental medicine procedure. The  patient is to continue his Amicar 5% rinse postoperatively.  Patient is rinse with 10 ML's by mouth every hour for the next 10 hours in a swish and spit manner. The patient is to continue his previous use of Percocet 7.5/325 every 4 hours as needed for moderate to severe pain as previously prescribed by Dr. Alvy Bimler. A prescription was provided to allow the patient to continue this pain medication postoperatively once he takes all of his other previous pain medication.    Lenn Cal, DDS.

## 2016-05-26 NOTE — Anesthesia Procedure Notes (Signed)
Procedure Name: Intubation Date/Time: 05/26/2016 7:39 AM Performed by: Annye Asa Pre-anesthesia Checklist: Patient identified, Emergency Drugs available, Suction available and Patient being monitored Patient Re-evaluated:Patient Re-evaluated prior to inductionOxygen Delivery Method: Circle system utilized Preoxygenation: Pre-oxygenation with 100% oxygen Intubation Type: IV induction and Tracheostomy Ventilation: Mask ventilation without difficulty Tube type: Reinforced Tube size: 6.5 mm Number of attempts: 1 Placement Confirmation: positive ETCO2 and breath sounds checked- equal and bilateral Tube secured with: Tape Dental Injury: Teeth and Oropharynx as per pre-operative assessment

## 2016-05-26 NOTE — Anesthesia Postprocedure Evaluation (Signed)
Anesthesia Post Note  Patient: JALAND BURDETTE  Procedure(s) Performed: Procedure(s) (LRB): Extraction of tooth #'s 2,5,6,8-13, 21-28 with alveoloplasty, bilateral mandibular tori reductions and buccal exostoses reductions of maxillary left, maxillary right, and mandibular left quadrants. (N/A)  Patient location during evaluation: PACU Anesthesia Type: General Level of consciousness: awake and alert, patient cooperative and oriented Pain management: pain level controlled Vital Signs Assessment: post-procedure vital signs reviewed and stable Respiratory status: spontaneous breathing, nonlabored ventilation and respiratory function stable Cardiovascular status: blood pressure returned to baseline and stable Postop Assessment: no signs of nausea or vomiting Anesthetic complications: no       Last Vitals:  Vitals:   05/26/16 0604 05/26/16 0957  BP: (!) 191/138 (!) 161/104  Pulse: 96 90  Resp: 18 14  Temp: 37.2 C 36.6 C    Last Pain:  Vitals:   05/26/16 0957  TempSrc:   PainSc: Asleep                 Bion Todorov,E. Shalay Carder

## 2016-05-27 ENCOUNTER — Encounter (HOSPITAL_COMMUNITY): Payer: Self-pay | Admitting: Dentistry

## 2016-05-29 ENCOUNTER — Telehealth: Payer: Self-pay | Admitting: *Deleted

## 2016-05-29 ENCOUNTER — Other Ambulatory Visit: Payer: Self-pay | Admitting: Hematology and Oncology

## 2016-05-29 DIAGNOSIS — C321 Malignant neoplasm of supraglottis: Secondary | ICD-10-CM

## 2016-05-29 NOTE — Telephone Encounter (Addendum)
Oncology Nurse Navigator Documentation  Received call from [place]. Per her conversation today with Mr. Rin and his wife recognizing difficulty in scheduling appts for [facility], they request SLP services to be provided by [facility] clinician. I indicated that can  be done, appts will be arranged.  [name], RN, BSN, [place] Oncology Nurse [employer] at [place] [phone]

## 2016-05-30 ENCOUNTER — Ambulatory Visit: Payer: No Typology Code available for payment source

## 2016-05-30 ENCOUNTER — Ambulatory Visit: Payer: Self-pay | Admitting: Nutrition

## 2016-05-30 ENCOUNTER — Ambulatory Visit
Admission: RE | Admit: 2016-05-30 | Discharge: 2016-05-30 | Disposition: A | Discharge status: Home / Self Care | Payer: Medicaid Other | Source: Ambulatory Visit | Attending: Radiation Oncology | Admitting: Radiation Oncology

## 2016-05-30 ENCOUNTER — Other Ambulatory Visit: Payer: Self-pay | Admitting: Hematology and Oncology

## 2016-05-30 ENCOUNTER — Ambulatory Visit: Payer: No Typology Code available for payment source | Attending: Radiation Oncology | Admitting: Physical Therapy

## 2016-05-30 ENCOUNTER — Encounter: Payer: Self-pay | Admitting: *Deleted

## 2016-05-30 DIAGNOSIS — R293 Abnormal posture: Secondary | ICD-10-CM | POA: Insufficient documentation

## 2016-05-30 DIAGNOSIS — R221 Localized swelling, mass and lump, neck: Secondary | ICD-10-CM | POA: Insufficient documentation

## 2016-05-30 DIAGNOSIS — R29898 Other symptoms and signs involving the musculoskeletal system: Secondary | ICD-10-CM | POA: Insufficient documentation

## 2016-05-30 NOTE — Progress Notes (Signed)
START OFF PATH[facility] REGIMEN - Head and Neck   [id number]:Docetaxel 75 mg/m2 + Cisplatin 100 mg/m2 + 5-Fluorouracil 1000 mg/m2/day D1-4 (TPF) q21 Days:   A cycle is every 21 days:     Docetaxel        Dose Mod: None     Cisplatin        Dose Mod: None     5-Fluorouracil        Dose Mod: None  **Always confirm dose/schedule in your pharmacy ordering system**    Patient Characteristics: Larynx, Stage III, IVA; Resectable, Primary Chemoradiation Disease Classification: Larynx AJCC T Category: T4a Current Disease Status: No Distant Mets or Local Recurrence AJCC 8 Stage Grouping: IVA AJCC N Category: cN2 AJCC M Category: M0  Intent of Therapy: Curative Intent, Discussed with Patient

## 2016-05-30 NOTE — Progress Notes (Signed)
Oncology Nurse Navigator Documentation  Met with Mr. Larin during [facility] [place].  He was unaccompanied.  Arrived him to Nursing, conducted VS, provided verbal and written overview of [place], the clinicians who will be seeing him, encouraged him to ask questions during his time with them.  He was seen by Nutrition, PT, SW and Financial Counseling. He understands I can be contacted with needs/concerns.  [name], RN, BSN, [place] at [place] [phone]

## 2016-05-30 NOTE — Therapy (Signed)
[place], [place], [address] Phone: [phone]   Fax:  [phone]  Physical Therapy Evaluation  Patient Details  Name: Jonathan [name] MRN: [id number] Date of Birth: December 17, 1960 Referring Provider: Dr. Eppie Gibson  Encounter Date: 05/30/2016      PT End of Session - 05/30/16 1139    Visit Number 1   Number of Visits 1   PT Start Time 0850   PT Stop Time 0915   PT Time Calculation (min) 25 min   Activity Tolerance Patient tolerated treatment well   Behavior During Therapy [facility] for tasks assessed/performed      Past Medical History:  Diagnosis Date  . Allergic rhinitis   . Arthritis    Bilateral knee arthritis for about 20 years. Not taking any medications, believes in nature processes  . Cancer (HCC)    larynx  . Colon polyp   . Dental caries    prechemoradiation therapy, periodontitis  . GERD (gastroesophageal reflux disease)   . Headache   . HLD (hyperlipidemia)     Past Surgical History:  Procedure Laterality Date  . COLONOSCOPY  03/18/2012  . ESOPHAGOSCOPY  04/24/2016   Procedure: ESOPHAGOSCOPY;  Surgeon: Izora Gala, MD;  Location: [place];  Service: ENT;;  . Luther Bradley  04/24/2016   Procedure: LARYNGOSCOPY;  Surgeon: Izora Gala, MD;  Location: [place];  Service: ENT;;  . MULTIPLE EXTRACTIONS WITH ALVEOLOPLASTY N/A 05/26/2016   Procedure: Extraction of tooth #'s 2,5,6,8-13, 21-28 with alveoloplasty, bilateral mandibular tori reductions and buccal exostoses reductions of maxillary left, maxillary right, and mandibular left quadrants.;  Surgeon: Lenn Cal, DDS;  Location: [place];  Service: Oral Surgery;  Laterality: N/A;  . TRACHEOSTOMY TUBE PLACEMENT N/A 04/24/2016   Procedure: TRACHEOSTOMY;  Surgeon: Izora Gala, MD;  Location: [place];  Service: ENT;  Laterality: N/A;    There were no vitals filed for this visit.       Subjective Assessment - 05/30/16 1127    Subjective Does  notice some fullness at anterior neck that is new since his trach was placed.   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  Will have inductive chemo, probably starting 06/05/16, followed by XRT.   Patient Stated Goals get info from all head & neck clinic providers   Currently in Pain? Yes   Pain Score 4    Pain Location Throat  and mouth; also headache   Pain Descriptors / Indicators Sore  + ache in head   Aggravating Factors  nothing   Pain Relieving Factors pain meds            [facility] PT Assessment - 05/30/16 0001      Assessment   Medical Diagnosis laryngeal and supraglottic squamous cell    Referring Provider Dr. Eppie Gibson   Onset Date/Surgical Date 04/24/16  biopsy   Prior Therapy none     Precautions   Precautions Other (comment)   Precaution Comments cancer precautions     Restrictions   Weight Bearing Restrictions No     Balance Screen   Has the patient fallen in the past 6 months No   Has the patient had a decrease in activity level because of a fear of falling?  No   Is the patient reluctant to leave their home because of a fear of falling?  No     Home Ecologist  residence   Living Arrangements Spouse/significant other   Type of [place] to enter   Entrance Stairs-Number of Steps 19   Entrance Stairs-Rails --  yes     Prior Function   Level of Independence Independent   Leisure no regular exercise     Cognition   Overall Cognitive Status Within Functional Limits for tasks assessed     Observation/Other Assessments   Observations appears to have some fullness at anterior neck     Functional Tests   Functional tests Sit to Stand     Sit to Stand   Comments 23 repetitions in 30 seconds, above average for age  mod. dyspnea after this     Posture/Postural Control   Posture/Postural Control Postural  limitations   Postural Limitations Forward head     ROM / Strength   AROM / PROM / Strength AROM     AROM   Overall AROM  Deficits   Overall AROM Comments neck flexion, extension, and sidebend all limited 50% by trach; rotation 25% limited bilat.  shoulder AROM WFL     Palpation   Palpation comment soft fullness at anterior neck above trach     Ambulation/Gait   Ambulation/Gait Yes   Ambulation/Gait Assistance 7: Independent           LYMPHEDEMA/ONCOLOGY QUESTIONNAIRE - 05/30/16 1135      Type   Cancer Type laryngeal and supraglottic SCC     Treatment   Active Chemotherapy Treatment --  to start 06/05/16   Active Radiation Treatment --  to follow     Lymphedema Assessments   Lymphedema Assessments Head and Neck     Head and Neck   Other 6 cm. superior to edge of trach frame, 42.1 cm.   Other 4 cm. superior to upper edge of trach frame, 39.7 cm.   Other 2 cm. superior to upper edge of trach frame, 37.3 cm.                        PT Education - 05/30/16 1138    Education provided Yes   Education Details posture, neck ROM, diaphragmatic breathing, walking, Cure article on staying active, lymphedema and PT info   Person(s) Educated Patient   Methods Explanation;Handout   Comprehension Verbalized understanding                 Head and Neck Clinic Goals - 05/30/16 1143      Patient will be able to verbalize understanding of a home exercise program for cervical range of motion, posture, and walking.    Status Achieved     Patient will be able to verbalize understanding of proper sitting and standing posture.    Status Achieved     Patient will be able to verbalize understanding of lymphedema risk and availability of treatment for this condition.    Status Achieved           Plan - 05/30/16 1139    Clinical Impression Statement Pleasant gentleman with diagnosis of laryngeal and supraglottic cancer; has trach in  place and appears to have some swelling, perhaps as a result of this procedure, at anterior neck.  He has some throat, mouth, and headache pain.  Neck AROM is significantly limited, at least in part due to the trach.  Posture is with significant forward head.  He did well on 30-second sit to stand test.  Eval is low complexity due  to limited comorbidities.   Rehab Potential Good   PT Frequency One time visit   PT Treatment/Interventions Patient/family education   PT Next Visit Plan None at this time, but may need follow-up after XRT for neck swelling.   PT Home Exercise Plan neck ROM, breathing exercise, walking   Consulted and Agree with Plan of Care Patient      Patient will benefit from skilled therapeutic intervention in order to improve the following deficits and impairments:  Postural dysfunction, Decreased range of motion, Increased edema  Visit Diagnosis: Localized swelling, mass and lump, neck - Plan: PT plan of care cert/re-cert  Abnormal posture - Plan: PT plan of care cert/re-cert  Other symptoms and signs involving the musculoskeletal system - Plan: PT plan of care cert/re-cert     Problem List Patient Active Problem List   Diagnosis Date Noted  . Cancer associated pain 05/18/2016  . Loose, teeth 05/15/2016  . Chronic periodontitis 05/15/2016  . Exostoses, multiple 05/15/2016  . 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 Dental caries 05/15/2016  . 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 Retained dental root 05/15/2016  . Malignant neoplasm of supraglottis ([place]) 05/10/2016  . Status post tracheostomy ([place]) 04/24/2016  . Hoarseness 11/21/2015  . Thrush 11/08/2015  . Tooth decay 06/03/2015  . History of colonic polyps 03/08/2015  . Tobacco use disorder 03/08/2015  . Esophageal reflux 09/12/2013  . Allergic rhinitis 09/12/2013    SALISBURY,DONNA 05/30/2016, 11:45 AM  [place], [place], [address] Phone: [phone]   Fax:  [phone]  Name: Jonathan [name] MRN: [id number] Date of Birth: [birth year]  Serafina Royals, PT 05/30/16 11:45 AM

## 2016-05-30 NOTE — Progress Notes (Signed)
Patient was seen in Head and Neck Clinic.  56 year old male diagnosed with Supraglosttis cancer. He will receive induction chemo followed by radiation therapy. He is status post dental extractions on 05/26/16.  PMH includes HLD, GERD, and Arthritis  Medications include Colace, Ativan, and Phenergan.  Labs include glucose of 104.  Height: 65.5 inches. Weight: 128 pounds. UBW: 140-145 pounds per patient. BMI: 20.98.  Patient reports he can tolerate liquids and smooth soft foods like yogurt or applesauce. He enjoys oral nutrition supplements. He has difficulty affording nutrition supplements. Denies other nutrition impact symptoms. Patient has had a 9% weight loss from usual body weight. He verbalizes that he does not want to lose more weight.  Nutrition diagnosis:  Inadequate oral intake related to supraglottis cancer as evidenced by 9% weight loss from usual body weight.  Intervention: Patient was educated to increase calories and protein in 6 small meals and snacks daily. Recommended patient try to consume 2-3 oral nutrition supplements daily. Educated patient on blenderizing foods.  He appears willing to try. Provided fact sheets. Provided one complementary case of Ensure Plus along with coupons. Questions were answered.  Teach back method used.  Contact information given.  Monitoring, evaluation, goals:  Patient will tolerate increased calories and protein to minimize weight loss and loss of lean body mass.  Next visit: To be scheduled with chemo.  **Disclaimer: This note was dictated with voice recognition software. Similar sounding words can inadvertently be transcribed and this note may contain transcription errors which may not have been corrected upon publication of note.**

## 2016-05-30 NOTE — Progress Notes (Addendum)
ALERT: Recent [facility] Treatment decision is outdated. Please await next [facility] decision 

## 2016-05-31 ENCOUNTER — Other Ambulatory Visit: Payer: Self-pay

## 2016-05-31 ENCOUNTER — Telehealth: Payer: Self-pay | Admitting: Hematology and Oncology

## 2016-05-31 ENCOUNTER — Encounter: Payer: Self-pay | Admitting: *Deleted

## 2016-05-31 ENCOUNTER — Encounter: Payer: Self-pay | Admitting: Hematology and Oncology

## 2016-05-31 ENCOUNTER — Ambulatory Visit (HOSPITAL_BASED_OUTPATIENT_CLINIC_OR_DEPARTMENT_OTHER): Payer: No Typology Code available for payment source | Admitting: Hematology and Oncology

## 2016-05-31 ENCOUNTER — Telehealth: Payer: Self-pay | Admitting: *Deleted

## 2016-05-31 ENCOUNTER — Other Ambulatory Visit: Payer: Self-pay | Admitting: Radiology

## 2016-05-31 VITALS — BP 135/88 | HR 88 | Temp 98.6°F | Resp 18 | Ht 65.5 in | Wt 131.2 lb

## 2016-05-31 DIAGNOSIS — F17200 Nicotine dependence, unspecified, uncomplicated: Secondary | ICD-10-CM

## 2016-05-31 DIAGNOSIS — Z930 Tracheostomy status: Secondary | ICD-10-CM

## 2016-05-31 DIAGNOSIS — Z720 Tobacco use: Secondary | ICD-10-CM

## 2016-05-31 DIAGNOSIS — K5909 Other constipation: Secondary | ICD-10-CM | POA: Insufficient documentation

## 2016-05-31 DIAGNOSIS — C321 Malignant neoplasm of supraglottis: Secondary | ICD-10-CM

## 2016-05-31 MED ORDER — DEXAMETHASONE 4 MG PO TABS: 8.0000 mg | ORAL_TABLET | Freq: Two times a day (BID) | ORAL | 1 refills | Status: DC

## 2016-05-31 MED ORDER — NICOTINE 7 MG/24HR TD PT24: 7.0000 mg | MEDICATED_PATCH | TRANSDERMAL | 0 refills | Status: DC

## 2016-05-31 MED ORDER — PROCHLORPERAZINE MALEATE 10 MG PO TABS: 10.0000 mg | ORAL_TABLET | Freq: Four times a day (QID) | ORAL | 1 refills | Status: DC | PRN

## 2016-05-31 MED ORDER — ONDANSETRON HCL 8 MG PO TABS: 8.0000 mg | ORAL_TABLET | Freq: Two times a day (BID) | ORAL | 1 refills | Status: DC | PRN

## 2016-05-31 MED ORDER — LIDOCAINE-PRILOCAINE 2.5-2.5 % EX CREA: TOPICAL_CREAM | CUTANEOUS | 3 refills | Status: DC

## 2016-05-31 MED ORDER — SENNA-DOCUSATE SODIUM 8.6-50 MG PO TABS: 2.0000 | ORAL_TABLET | Freq: Every day | ORAL | 0 refills | Status: AC

## 2016-05-31 MED FILL — NICOTINE 7 MG/24HR PATCH: 7 | 14 days supply | Qty: 14 | Fill #0

## 2016-05-31 MED FILL — DEXAMETHASONE 4 MG TABLET: 4 | 4 days supply | Qty: 30 | Fill #0

## 2016-05-31 MED FILL — LIDOCAINE-PRILOCAINE CREAM: 2.5-2.5 | 30 days supply | Qty: 30 | Fill #0

## 2016-05-31 MED FILL — PROCHLORPERAZINE 10 MG TAB: 10 | 7 days supply | Qty: 30 | Fill #0

## 2016-05-31 MED FILL — ONDANSETRON HCL 8 MG TABLET: 8 | 15 days supply | Qty: 30 | Fill #0

## 2016-05-31 MED FILL — SENNA PLUS 8.6-50 MG TAB: 8.6-50 | 30 days supply | Qty: 60 | Fill #0

## 2016-05-31 NOTE — Progress Notes (Signed)
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 Epicenter at the glottis on the right. Extension across midline as well as involvement of laryngeal cartilages (possibly also bilateral). 2. Relatively small but malignant appearing right level 2 lymphadenopathy. Suspicious contralateral left level 2 lymph nodes.      04/21/2016 Initial Diagnosis    He saw Dr. Constance Holster in the clinic for chronic hoarseness. In the office, it was noted that the posterior soft palate, uvula, tongue base and vallecula were visualized and appeared healthy without mucosal masses or lesions. There is a large mass involving the right side of the larynx, the right cord is fixed. There may be involvement of the post cricoid area it is difficult to say for sure. 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 Clinically this is at least staged as T3. The airway is significantly impaired. He is at risk for impending airway obstruction. ENT recommended him for awake tracheostomy with direct laryngoscopy and biopsy and esophagoscopy under anesthesia immediately following that.       04/24/2016 Pathology Results    Larynx, biopsy, Right Side - POSITIVE FOR INVASIVE SQUAMOUS CELL CARCINOMA. - SEE COMMENT. Microscopic Comment As sampled, the squamous cell carcinoma appears well differentiated. A fragment of benign cartilage is also present in the specimen.      04/24/2016 Surgery    She underwent direct laryngoscopy with biopsy. An anterior commissure scope was used to evaluate the larynx. A large mass and, using the right  side of the larynx and the anterior commissure area was identified. Multiple biopsies were taken. The left cord was swollen but seemed to be uninvolved. Piriforms appeared to be clear. The post cricoid area appeared clear. Esophagoscopy was difficult and ENT surgeon was unable to pass the scope into the esophagus. He had tracheostomy      04/24/2016 Pathology Results    Larynx, biopsy, Right Side - POSITIVE FOR INVASIVE SQUAMOUS CELL CARCINOMA. - SEE COMMENT. Microscopic Comment As sampled, the squamous cell carcinoma appears well differentiated. A fragment of benign cartilage is also present in the specimen.       05/15/2016 PET scan    Right eccentric glottic mass, appears to cross the anterior commissure with destructive findings of the thyroid cartilage. There is an upper normal size right station 2A lymph node with maximum SUV of 4.2 which is only very faintly elevated over background, but which could possibly represent a small amount of local metastatic spread. 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 2. Asymmetric calcification along the head of the right caudate nucleus and adjacent lentiform nucleus. Well this can sometimes be physiologic, there is some accompanying reduction in PET activity, and the asymmetry is an unusual feature. Accordingly I recommend MRI of the brain with and without contrast for further characterization. 3. A AP window lymph node is minimally enlarged at 1.1 cm, but with a maximum SUV below background mediastinal levels, likely benign.      05/23/2016 Imaging    MRI brain: Atrophy with chronic microvascular ischemic change. No  intracranial metastatic disease. 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       05/26/2016 Surgery    Multiple  extraction of tooth numbers 2, 5, 6, 8-13, and  21-28. 2. 4 Quadrants of alveoloplasty. 3. Bilateral mandibular lingual tori reductions 4. Maxillary right, maxillary left, and mandibular left buccal exostoses reductions       INTERVAL HISTORY: Please see below for problem oriented charting. He returns with his girlfriend, [name] for further evaluation. He has completed recent dental extraction.  He denies recent bleeding or pain from the trach site.  His pain level is 4 out of 10 with current prescription pain medicine. He had recent constipation.  REVIEW OF SYSTEMS:   Constitutional: Denies fevers, chills or abnormal weight loss Eyes: Denies blurriness of vision Respiratory: Denies cough, dyspnea or wheezes Cardiovascular: Denies palpitation, chest discomfort or lower extremity swelling Skin: Denies abnormal skin rashes Lymphatics: Denies new lymphadenopathy or easy bruising Neurological:Denies numbness, tingling or new weaknesses Behavioral/Psych: Mood is stable, no new changes  All other systems were reviewed with the patient and are negative.  I have reviewed the past medical history, past surgical history, social history and family history with the patient and they are unchanged from previous note.  ALLERGIES:  is allergic to no known allergies.  MEDICATIONS:  Current Outpatient Prescriptions  Medication Sig Dispense Refill  . docusate sodium (COLACE) 100 MG capsule Take 1 capsule (100 mg total) by mouth 2 (two) times daily. 30 capsule 9  . ibuprofen (ADVIL,MOTRIN) 100 MG/5ML suspension Take 400 mg by mouth every 6 (six) hours as needed for fever or mild pain.     Marland Kitchen LORazepam (ATIVAN) 0.5 MG tablet Take 1-2 tablets 30 min before MRI and before mask fabrication to help with claustrophobia. 4 tablet 0  . nicotine (NICODERM CQ - DOSED IN MG/24 HR) 7 mg/24hr patch Place 1 patch (7 mg total) onto the skin daily. 14 patch 0  . oxyCODONE-acetaminophen (PERCOCET) 7.5-325 MG tablet  Take 1 tablet by mouth every 4 (four) hours as needed for moderate pain or severe pain. 42 tablet 0  . dexamethasone (DECADRON) 4 MG tablet Take 2 tablets (8 mg total) by mouth 2 (two) times daily. Start the day before Taxotere. Take once the day after, then 2 times a day x 2d. 30 tablet 1  . lidocaine-prilocaine (EMLA) cream Apply to affected area once 30 g 3  . ondansetron (ZOFRAN) 8 MG tablet Take 1 tablet (8 mg total) by mouth 2 (two) times daily as needed (Nausea or vomiting). Begin 4 days after chemotherapy. 30 tablet 1  . prochlorperazine (COMPAZINE) 10 MG tablet Take 1 tablet (10 mg total) by mouth every 6 (six) hours as needed (Nausea or vomiting). 30 tablet 1  . promethazine (PHENERGAN) 25 MG suppository Place 1 suppository (25 mg total) rectally every 6 (six) hours as needed for nausea. (Patient not taking: Reported on 05/15/2016) 12 each 0  . sennosides-docusate sodium (SENOKOT-S) 8.6-50 MG tablet Take 2 tablets by mouth daily. 60 tablet 0   No current facility-administered medications for this visit.     PHYSICAL EXAMINATION: ECOG PERFORMANCE STATUS: 1 - Symptomatic but completely ambulatory  Vitals:   05/31/16 1319  BP: 135/88  Pulse: 88  Resp: 18  Temp: 98.6 F (37 C)   Filed Weights   05/31/16 1319  Weight: 131 lb 3.2 oz (59.5 kg)  GENERAL:alert, no distress and comfortable SKIN: skin color, texture, turgor are normal, no rashes or significant lesions EYES: normal, Conjunctiva are pink and non-injected, sclera clear OROPHARYNX:no exudate, no erythema and lips, buccal mucosa, and tongue normal  NECK: Tracheostomy site looks clean. LYMPH:  no palpable lymphadenopathy in the cervical, axillary or inguinal LUNGS: clear to auscultation and percussion with normal breathing effort HEART: regular rate & rhythm and no murmurs and no lower extremity edema ABDOMEN:abdomen soft, non-tender and normal bowel sounds Musculoskeletal:no cyanosis of digits and no clubbing  NEURO:  alert & oriented x 3 with fluent speech, no focal motor/sensory deficits  LABORATORY DATA:  I have reviewed the data as listed    Component Value Date/Time   NA 139 05/26/2016 0627   K 3.6 05/26/2016 0627   CL 103 05/26/2016 0627   CO2 27 05/26/2016 0627   GLUCOSE 100 (H) 05/26/2016 0627   BUN 6 05/26/2016 0627   CREATININE 0.93 05/26/2016 0627   CREATININE 0.98 01/09/2014 1534   CALCIUM 9.1 05/26/2016 0627   PROT 8.3 (H) 04/24/2016 0919   ALBUMIN 4.4 04/24/2016 0919   AST 22 04/24/2016 0919   ALT 15 (L) 04/24/2016 0919   ALKPHOS 67 04/24/2016 0919   BILITOT 0.8 04/24/2016 0919   GFRNONAA >60 05/26/2016 0627   GFRNONAA 82 03/06/2011 0853   GFRAA >60 05/26/2016 0627   GFRAA >89 03/06/2011 0853    No results found for: SPEP, UPEP  Lab Results  Component Value Date   WBC 7.1 05/26/2016   NEUTROABS 4.5 01/09/2014   HGB 12.3 (L) 05/26/2016   HCT 37.9 (L) 05/26/2016   MCV 91.8 05/26/2016   PLT 310 05/26/2016      Chemistry      Component Value Date/Time   NA 139 05/26/2016 0627   K 3.6 05/26/2016 0627   CL 103 05/26/2016 0627   CO2 27 05/26/2016 0627   BUN 6 05/26/2016 0627   CREATININE 0.93 05/26/2016 0627   CREATININE 0.98 01/09/2014 1534      Component Value Date/Time   CALCIUM 9.1 05/26/2016 0627   ALKPHOS 67 04/24/2016 0919   AST 22 04/24/2016 0919   ALT 15 (L) 04/24/2016 0919   BILITOT 0.8 04/24/2016 0919       RADIOGRAPHIC STUDIES: I have personally reviewed the radiological images as listed and agreed with the findings in the report. Mr Jeri Cos Wo Contrast  Result Date: 05/23/2016 CLINICAL DATA:  Newly diagnosed squamous cell carcinoma of the larynx. Staging. EXAM: MRI HEAD WITHOUT AND WITH CONTRAST TECHNIQUE: Multiplanar, multiecho pulse sequences of the brain and surrounding structures were obtained without and with intravenous contrast. CONTRAST:  74mL MULTIHANCE GADOBENATE DIMEGLUMINE 529 MG/ML IV SOLN COMPARISON:  CT neck 04/17/2016.  PET scan  05/15/2016. FINDINGS: The patient was unable to remain motionless for the exam. Small or subtle lesions could be overlooked. Brain: No acute infarction, acute hemorrhage, hydrocephalus, extra-axial collection or mass lesion. Cerebral atrophy. Chronic microvascular ischemic change. Post infusion, no intracranial metastatic disease. Large enhancing tubular structures, inferior RIGHT frontal lobe, with regional encephalomalacia and hemosiderin deposition. This appears to represent sequelae of old hemorrhage secondary to venous angioma. Superficial drainage of the venous angioma appears to occur into the middle cerebral vein region. This developmental venous anomaly likely accounts for the PET scan findings of calcification and reduced metabolic activity. Vascular: Normal arterial flow voids. Skull and upper cervical spine: Normal marrow signal. Sinuses/Orbits: Negative. Other: None. IMPRESSION: Atrophy with chronic microvascular ischemic change. No intracranial  metastatic disease. 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 Electronically Signed   By: Staci Righter M.D.   On: 05/23/2016 17:15   Nm Pet Image Initial (pi) Whole Body  Result Date: 05/15/2016 CLINICAL DATA:  Initial treatment strategy for laryngeal cancer. EXAM: NUCLEAR MEDICINE PET WHOLE BODY TECHNIQUE: 6.4 mCi F-18 FDG was injected intravenously. Full-ring PET imaging was performed from the vertex to the feet after the radiotracer. CT data was obtained and used for attenuation correction and anatomic localization. FASTING BLOOD GLUCOSE:  Value: 98 mg/dl COMPARISON:  CT scan 04/17/2016 FINDINGS: HEAD/NECK Intracranially, there is abnormal calcification along the head of the right caudate nucleus, anterior limb of the right internal capsule, and anterior portion the lentiform nucleus with some  asymmetrically low PET activity in this vicinity, dedicated brain MRI with and without contrast recommended for definitive characterization. The primarily right eccentric glottic mass has a maximum SUV of 11.6, compatible with malignancy. Right station IIa lymph node measures 0.8 cm in short axis on image 53 series 3 with maximum SUV 4.2, only very faintly elevated over background. None of the other small lymph nodes are demonstrably accentuated in metabolic activity. Below the level of the laryngeal mass there is a tracheostomy tube in place. CHEST Background mediastinal blood pool activity 3.1. An AP window lymph node measuring 1.1 cm in short axis on image 87/3 has a maximum SUV of 2.9. A left axillary lymph node measuring 7 mm in short axis on image 91/3 has a maximum SUV of 3.1. ABDOMEN/PELVIS No abnormal hypermetabolic activity within the liver, pancreas, adrenal glands, or spleen. No hypermetabolic lymph nodes in the abdomen or pelvis. SKELETON No focal hypermetabolic activity to suggest skeletal metastasis. EXTREMITIES No abnormal hypermetabolic activity in the lower extremities. IMPRESSION: 1. Right eccentric glottic mass, appears to cross the anterior commissure with destructive findings of the thyroid cartilage. There is an upper normal size right station 2A lymph node with maximum SUV of 4.2 which is only very faintly elevated over background, but which could possibly represent a small amount of local metastatic spread. 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 2. Asymmetric calcification along the head of the right caudate nucleus and adjacent lentiform nucleus. Well this can sometimes be physiologic, there is some accompanying reduction in PET activity, and the asymmetry is an unusual feature. Accordingly I recommend MRI of the brain with and without contrast for further characterization. 3. A AP window lymph node is minimally enlarged  at 1.1 cm, but with a maximum SUV below background mediastinal levels, likely benign. Electronically Signed   By: Van Clines M.D.   On: 05/15/2016 14:36    ASSESSMENT & PLAN:  Malignant neoplasm of supraglottis ([place]) The decision was made based on publication at the [facility]. It is a category 1 recommendation from NCCN. Intention is to induce remission and of curative intent. He is aware he will need radiation treatment and possibly surgery if treatment does not work  Cisplatin, Fluorouracil, and Docetaxel in Unresectable Head and Neck Cancer Jan B.  Desanctis, M.D., Ph.D., Hulan Fess, M.D., Tia Masker, M.D., Ph.D., Luiz Blare, M.[place]., Leda Quail, M.D., Jola Schmidt, M.D., Gwynn Burly. Nicole Kindred, M.D., Raynald Blend, M.D., Daine Floras, M.D., Leslye Peer. Avelino Leeds, M.D., Ph.D., Linnell Fulling, M.D., Effie Shy, M.D., Ph.D., Laury Axon, M.D., Virl Cagey, M.D.,  Almetta Lovely, M.D., Tama Gander, M.[place]., Cherre Blanc, M.D., Ph.D., and Tyrone Sage, M.D., for the [employer] [id number] [address]* [employer] Med 2007; [id number], [place]: [id number] ermorken, et al. [employer] Med 2008; [phone]  The chemotherapy consists of docetaxel (at a dose of 75 mg per square meter of body-surface area) was administered as a 1-hour intravenous infusion, followed by intravenous cisplatin (100 mg per square meter), administered during a period of 0.5 to 3 hours.  After completion of the cisplatin infusion, 5FU infusion 1) Median survival was 71 months (95% CI, 49 to not reached) in the TPF group and 30 months (95% CI, 21 to 52) in the PF group (P=0.006).   2) Estimated 3-year survival was 62% (95% CI, 56 to 68) in the TPF group   3) Tumor progression was the most common cause of death (occurring in 29% of the TPF group   4) As compared with the PF group, the TPF group had a significant reduction in the risk of disease progression or death (hazard  ratio, 0.71; 95% CI, 0.56 to 0.90; P=0.004). The median progression-free survival was 36 months (95% CI, 19 to not reached) in the TPF group and 13 months (95% CI, 11 to 20) in the PF group.   5) Estimates of progression-free survival at 2 years were 53% in the TPF group and 42% in the PF group (P=0.01).  6) At the time of this analysis, the treatment had failed in 198 patients, 88 (35%) in the TPF group and 110 (45%) in the PF group (P=0.01) Nineteen patients had second primary tumors. The rate of reported locoregional failure was 30% in the TPF group and 38% in the PF group (P=0.04). Distant metastases occurred in 5% and 9% of the patients, respectively (P=0.14).  For treatment of this patient, I plan to modify fluorouracil to be administered as a continuous 24-hour infusion for 4 days.  We discussed the role of chemotherapy. The intent is for cure.  We discussed some of the risks, benefits, side-effects of Taxotere, cisplatin & 5FU.  Some of the short term side-effects included, though not limited to, including weight loss, life threatening infections, risk of allergic reactions, need for transfusions of blood products, nausea, vomiting, change in bowel habits, loss of hair, admission to hospital for various reasons, and risks of death.   Long term side-effects are also discussed including risks of infertility, permanent damage to nerve function, hearing loss, chronic fatigue, kidney damage with possibility needing hemodialysis, and rare secondary malignancy including bone marrow disorders.  The patient is aware that the response rates discussed earlier is not guaranteed.  After a long discussion, patient made an informed decision to proceed with the prescribed plan of care.   Patient education material was dispensed. The patient will get G-CSF support when he returns the pump on day 5. I plan to restage him with PET CT scan after 2 cycles of chemotherapy. I will see him on a weekly basis for  supportive care.   Status post tracheostomy [facility]) The tracheostomy site looks good.  The patient inquired about trach supplies. I recommend he calls his ENT physician for further direction  Tobacco use disorder The patient has quit smoking recently.  I recommend tapering him off nicotine patch.  Constipation He has recent constipation due to narcotic prescription. I recommend regular Senokot   Orders Placed This Encounter  Procedures  . CBC with Differential    Standing Status:   Standing  Number of Occurrences:   20    Standing Expiration Date:   06/01/2017  . Comprehensive metabolic panel    Standing Status:   Standing    Number of Occurrences:   20    Standing Expiration Date:   06/01/2017  . Magnesium    Standing Status:   Standing    Number of Occurrences:   9    Standing Expiration Date:   05/31/2017   All questions were answered. The patient knows to call the clinic with any problems, questions or concerns. No barriers to learning was detected. I spent 40 minutes counseling the patient face to face. The total time spent in the appointment was 55 minutes and more than 50% was on counseling and review of test results     Heath Lark, MD 05/31/2016 4:36 PM

## 2016-05-31 NOTE — Telephone Encounter (Signed)
Appointments scheduled per 05/31/16 los, Patient was given a copy of the AVS report and appointment schedule per 05/31/16 los.

## 2016-05-31 NOTE — Telephone Encounter (Signed)
Oncology Nurse Navigator Documentation  Sent fax to [facility] requesting baseline audiology, asked her to call me when appt scheduled, fax copy of report.  'Successful TX Notice' received.  Rick Marilin Kofman, RN, BSN, CHPN Head & Neck Oncology Nurse Navigator [facility] [place] [phone]  

## 2016-05-31 NOTE — Assessment & Plan Note (Signed)
He has recent constipation due to narcotic prescription. I recommend regular Senokot

## 2016-05-31 NOTE — Assessment & Plan Note (Addendum)
The decision was made based on publication at the [facility]. It is a category 1 recommendation from NCCN. Intention is to induce remission and of curative intent. He is aware he will need radiation treatment and possibly surgery if treatment does not work  Cisplatin, Fluorouracil, and Docetaxel in Unresectable Head and Neck Cancer Jan B. [place] Desanctis, M.D., Ph.D., Hulan Fess, M.D., Tia Masker, M.D., Ph.D., Luiz Blare, M.[place]., Leda Quail, M.D., Jola Schmidt, M.D., Gwynn Burly. Nicole Kindred, M.D., Raynald Blend, M.D., Daine Floras, M.D., Leslye Peer. Avelino Leeds, M.D., Ph.D., Linnell Fulling, M.D., Effie Shy, M.D., Ph.D., Laury Axon, M.D., Virl Cagey, M.D., Almetta Lovely, M.D., Tama Gander, M.., Cherre Blanc, M.D., Ph.D., and Tyrone Sage, M.D., for the [employer] [address]/TAX [address]* [employer] Med 2007; [phone], [place]: [id number] ermorken, et al. [employer] Med [phone]  The chemotherapy consists of docetaxel (at a dose of 75 mg per square meter of body-surface area) was administered as a 1-hour intravenous infusion, followed by intravenous cisplatin (100 mg per square meter), administered during a period of 0.5 to 3 hours.  After completion of the cisplatin infusion, 5FU infusion 1) Median survival was 71 months (95% CI, 49 to not reached) in the TPF group and 30 months (95% CI, 21 to 52) in the PF group (P=0.006).   2) Estimated 3-year survival was 62% (95% CI, 56 to 68) in the TPF group   3) Tumor progression was the most common cause of death (occurring in 29% of the TPF group   4) As compared with the PF group, the TPF group had a significant reduction in the risk of disease progression or death (hazard ratio, 0.71; 95% CI, 0.56 to 0.90; P=0.004). The median progression-free survival was 36 months (95% CI, 19 to not reached) in the TPF group and 13 months (95% CI, 11 to 20) in the PF group.   5) Estimates of progression-free  survival at 2 years were 53% in the TPF group and 42% in the PF group (P=0.01).  6) At the time of this analysis, the treatment had failed in 198 patients, 88 (35%) in the TPF group and 110 (45%) in the PF group (P=0.01) Nineteen patients had second primary tumors. The rate of reported locoregional failure was 30% in the TPF group and 38% in the PF group (P=0.04). Distant metastases occurred in 5% and 9% of the patients, respectively (P=0.14).  For treatment of this patient, I plan to modify fluorouracil to be administered as a continuous 24-hour infusion for 4 days.  We discussed the role of chemotherapy. The intent is for cure.  We discussed some of the risks, benefits, side-effects of Taxotere, cisplatin & 5FU.  Some of the short term side-effects included, though not limited to, including weight loss, life threatening infections, risk of allergic reactions, need for transfusions of blood products, nausea, vomiting, change in bowel habits, loss of hair, admission to hospital for various reasons, and risks of death.   Long term side-effects are also discussed including risks of infertility, permanent damage to nerve function, hearing loss, chronic fatigue, kidney damage with possibility needing hemodialysis, and rare secondary malignancy including bone marrow disorders.  The patient is aware that the response rates discussed earlier is not guaranteed.  After a long discussion, patient made an informed decision to proceed with the prescribed plan of care.   Patient education material was dispensed. The patient will get G-CSF support when he returns the pump on day 5. I plan  to restage him with PET CT scan after 2 cycles of chemotherapy. I will see him on a weekly basis for supportive care.

## 2016-05-31 NOTE — Assessment & Plan Note (Signed)
The patient has quit smoking recently.  I recommend tapering him off nicotine patch.

## 2016-05-31 NOTE — Assessment & Plan Note (Signed)
The tracheostomy site looks good.  The patient inquired about trach supplies. I recommend he calls his ENT physician for further direction

## 2016-06-01 ENCOUNTER — Other Ambulatory Visit: Payer: Self-pay | Admitting: Radiology

## 2016-06-01 ENCOUNTER — Telehealth: Payer: Self-pay

## 2016-06-01 ENCOUNTER — Other Ambulatory Visit: Payer: Self-pay | Admitting: Student

## 2016-06-01 DIAGNOSIS — Z930 Tracheostomy status: Secondary | ICD-10-CM | POA: Diagnosis not present

## 2016-06-01 NOTE — Telephone Encounter (Signed)
Called with below message. 

## 2016-06-01 NOTE — Telephone Encounter (Signed)
-----   Message from Heath Lark, MD sent at 05/31/2016  4:32 PM EST ----- Regarding: call IR IR is placing port on Friday Can you ask them to draw CBC, CMP and mag? I have orders on standing

## 2016-06-02 ENCOUNTER — Ambulatory Visit (HOSPITAL_COMMUNITY)
Admission: RE | Admit: 2016-06-02 | Discharge: 2016-06-02 | Disposition: A | Discharge status: Home / Self Care | Payer: No Typology Code available for payment source | Source: Ambulatory Visit | Attending: Hematology and Oncology | Admitting: Hematology and Oncology

## 2016-06-02 ENCOUNTER — Encounter (HOSPITAL_COMMUNITY): Payer: Self-pay

## 2016-06-02 ENCOUNTER — Other Ambulatory Visit: Payer: Self-pay | Admitting: Hematology and Oncology

## 2016-06-02 DIAGNOSIS — Z5111 Encounter for antineoplastic chemotherapy: Secondary | ICD-10-CM | POA: Diagnosis not present

## 2016-06-02 DIAGNOSIS — Z930 Tracheostomy status: Secondary | ICD-10-CM | POA: Insufficient documentation

## 2016-06-02 DIAGNOSIS — C321 Malignant neoplasm of supraglottis: Secondary | ICD-10-CM | POA: Insufficient documentation

## 2016-06-02 DIAGNOSIS — Z87891 Personal history of nicotine dependence: Secondary | ICD-10-CM | POA: Insufficient documentation

## 2016-06-02 DIAGNOSIS — E785 Hyperlipidemia, unspecified: Secondary | ICD-10-CM | POA: Insufficient documentation

## 2016-06-02 DIAGNOSIS — Z8249 Family history of ischemic heart disease and other diseases of the circulatory system: Secondary | ICD-10-CM | POA: Insufficient documentation

## 2016-06-02 DIAGNOSIS — K219 Gastro-esophageal reflux disease without esophagitis: Secondary | ICD-10-CM | POA: Insufficient documentation

## 2016-06-02 HISTORY — PX: IR GENERIC HISTORICAL: IMG1180011

## 2016-06-02 LAB — CBC
HCT: 39.2 % (ref 39.0–52.0)
HEMOGLOBIN: 13.2 g/dL (ref 13.0–17.0)
MCH: 29.8 pg (ref 26.0–34.0)
MCHC: 33.7 g/dL (ref 30.0–36.0)
MCV: 88.5 fL (ref 78.0–100.0)
PLATELETS: 460 10*3/uL — AB (ref 150–400)
RBC: 4.43 MIL/uL (ref 4.22–5.81)
RDW: 13.7 % (ref 11.5–15.5)
WBC: 8.1 10*3/uL (ref 4.0–10.5)

## 2016-06-02 LAB — PROTIME-INR
INR: 0.97
Prothrombin Time: 12.9 s (ref 11.4–15.2)

## 2016-06-02 LAB — COMPREHENSIVE METABOLIC PANEL
ALT: 19 U/L (ref 17–63)
ANION GAP: 9 (ref 5–15)
AST: 21 U/L (ref 15–41)
Albumin: 4.4 g/dL (ref 3.5–5.0)
Alkaline Phosphatase: 92 U/L (ref 38–126)
BILIRUBIN TOTAL: 0.5 mg/dL (ref 0.3–1.2)
BUN: 10 mg/dL (ref 6–20)
CHLORIDE: 101 mmol/L (ref 101–111)
CO2: 27 mmol/L (ref 22–32)
Calcium: 9.9 mg/dL (ref 8.9–10.3)
Creatinine, Ser: 0.86 mg/dL (ref 0.61–1.24)
GFR calc Af Amer: >60 mL/min (ref >60)
GLUCOSE: 92 mg/dL (ref 65–99)
POTASSIUM: 4.1 mmol/L (ref 3.5–5.1)
Sodium: 137 mmol/L (ref 135–145)
TOTAL PROTEIN: 8.4 g/dL — AB (ref 6.5–8.1)

## 2016-06-02 LAB — APTT: APTT: 38 s — AB (ref 24–36)

## 2016-06-02 LAB — MAGNESIUM: Magnesium: 2 mg/dL (ref 1.7–2.4)

## 2016-06-02 MED ORDER — MIDAZOLAM HCL 2 MG/2ML IJ SOLN
INTRAMUSCULAR | Status: AC
  Filled 2016-06-02: qty 6

## 2016-06-02 MED ORDER — CEFAZOLIN SODIUM-DEXTROSE 2-4 GM/100ML-% IV SOLN
2.0000 g | INTRAVENOUS | Status: AC
  Administered 2016-06-02: 2 g via INTRAVENOUS

## 2016-06-02 MED ORDER — LIDOCAINE-EPINEPHRINE (PF) 2 %-1:200000 IJ SOLN
INTRAMUSCULAR | Status: AC
  Filled 2016-06-02: qty 20

## 2016-06-02 MED ORDER — HEPARIN SOD (PORK) LOCK FLUSH 100 UNIT/ML IV SOLN
INTRAVENOUS | Status: AC | PRN
  Administered 2016-06-02: 500 [IU] via INTRAVENOUS

## 2016-06-02 MED ORDER — MIDAZOLAM HCL 2 MG/2ML IJ SOLN
INTRAMUSCULAR | Status: AC | PRN
  Administered 2016-06-02 (×2): 1 mg via INTRAVENOUS

## 2016-06-02 MED ORDER — CEFAZOLIN SODIUM-DEXTROSE 2-4 GM/100ML-% IV SOLN
INTRAVENOUS | Status: AC
  Administered 2016-06-02: 2 g via INTRAVENOUS
  Filled 2016-06-02: qty 100

## 2016-06-02 MED ORDER — FENTANYL CITRATE (PF) 100 MCG/2ML IJ SOLN
INTRAMUSCULAR | Status: AC
  Filled 2016-06-02: qty 4

## 2016-06-02 MED ORDER — SODIUM CHLORIDE 0.9 % IV SOLN
INTRAVENOUS | Status: DC
  Administered 2016-06-02: 13:00:00 via INTRAVENOUS

## 2016-06-02 MED ORDER — FENTANYL CITRATE (PF) 100 MCG/2ML IJ SOLN
INTRAMUSCULAR | Status: AC | PRN
  Administered 2016-06-02: 50 ug via INTRAVENOUS

## 2016-06-02 MED ORDER — HEPARIN SOD (PORK) LOCK FLUSH 100 UNIT/ML IV SOLN
INTRAVENOUS | Status: AC
  Filled 2016-06-02: qty 5

## 2016-06-02 MED ORDER — LIDOCAINE-EPINEPHRINE (PF) 2 %-1:200000 IJ SOLN
INTRAMUSCULAR | Status: AC | PRN
  Administered 2016-06-02: 20 mL

## 2016-06-02 NOTE — Consult Note (Signed)
Chief Complaint: Patient was seen in consultation today for Port-A-Cath placement  Referring Physician(s): Gorsuch,Ni  Supervising Physician: Sandi Mariscal  Patient Status: [facility] - Out-pt  History of Present Illness: Jonathan [name] is a 56 y.o. 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  Past Medical History:  Diagnosis Date  . Allergic rhinitis   . Arthritis    Bilateral knee arthritis for about 20 years. Not taking any medications, believes in nature processes  . Cancer (HCC)    larynx  . Colon polyp   . Dental caries    prechemoradiation therapy, periodontitis  . GERD (gastroesophageal reflux disease)   . Headache   . HLD (hyperlipidemia)     Past Surgical History:  Procedure Laterality Date  . COLONOSCOPY  03/18/2012  . ESOPHAGOSCOPY  04/24/2016   Procedure: ESOPHAGOSCOPY;  Surgeon: Izora Gala, MD;  Location: [place];  Service: ENT;;  . Luther Bradley  04/24/2016   Procedure: LARYNGOSCOPY;  Surgeon: Izora Gala, MD;  Location: [place];  Service: ENT;;  . MULTIPLE EXTRACTIONS WITH ALVEOLOPLASTY N/A 05/26/2016   Procedure: Extraction of tooth #'s 2,5,6,8-13, 21-28 with alveoloplasty, bilateral mandibular tori reductions and buccal exostoses reductions of maxillary left, maxillary right, and mandibular left quadrants.;  Surgeon: Lenn Cal, DDS;  Location: [place];  Service: Oral Surgery;  Laterality: N/A;  . TRACHEOSTOMY TUBE PLACEMENT N/A 04/24/2016   Procedure: TRACHEOSTOMY;  Surgeon: Izora Gala, MD;  Location: [place];  Service: ENT;  Laterality: N/A;    Allergies: No known allergies  Medications: Prior to Admission medications   Medication Sig Start Date End Date Taking? Authorizing Provider  docusate sodium (COLACE) 100 MG capsule Take 1 capsule (100 mg total) by mouth 2 (two) times daily. 05/17/16  Yes Heath Lark, MD  oxyCODONE-acetaminophen (PERCOCET)  7.5-325 MG tablet Take 1 tablet by mouth every 4 (four) hours as needed for moderate pain or severe pain. 05/26/16  Yes Lenn Cal, DDS  dexamethasone (DECADRON) 4 MG tablet Take 2 tablets (8 mg total) by mouth 2 (two) times daily. Start the day before Taxotere. Take once the day after, then 2 times a day x 2d. 05/31/16   Heath Lark, MD  lidocaine-prilocaine (EMLA) cream Apply to affected area once 05/31/16   Heath Lark, MD  LORazepam (ATIVAN) 0.5 MG tablet Take 1-2 tablets 30 min before MRI and before mask fabrication to help with claustrophobia. 05/17/16   Heath Lark, MD  nicotine (NICODERM CQ - DOSED IN MG/24 HR) 7 mg/24hr patch Place 1 patch (7 mg total) onto the skin daily. 05/31/16   Heath Lark, MD  ondansetron (ZOFRAN) 8 MG tablet Take 1 tablet (8 mg total) by mouth 2 (two) times daily as needed (Nausea or vomiting). Begin 4 days after chemotherapy. 05/31/16   Heath Lark, MD  prochlorperazine (COMPAZINE) 10 MG tablet Take 1 tablet (10 mg total) by mouth every 6 (six) hours as needed (Nausea or vomiting). 05/31/16   Heath Lark, MD  promethazine (PHENERGAN) 25 MG suppository Place 1 suppository (25 mg total) rectally every 6 (six) hours as needed for nausea. Patient not taking: Reported on 05/15/2016 04/29/16   Izora Gala, MD  sennosides-docusate sodium (SENOKOT-S) 8.6-50 MG tablet Take 2 tablets by mouth daily. 05/31/16 06/30/16  Heath Lark, MD     Family History  Problem Relation Age of Onset  . Prostate cancer Maternal Uncle 66    On radiation  therapy now, diagnosed in around June 2012  . Colon cancer Maternal Uncle   . Heart attack Paternal Grandfather     Diet due to MI  . Lung cancer Paternal Aunt   . Brain cancer Paternal Aunt   . Kidney disease Maternal Uncle   . Hypertension Mother   . CAD Father   . Colon polyps Neg Hx   . Diabetes Neg Hx   . Gallbladder disease Neg Hx   . Esophageal cancer Neg Hx     Social History   Social History  . Marital status: Single    Spouse  name: N/A  . Number of children: 4  . Years of education: N/A   Occupational History  . 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 Smoking status: Former Smoker    Packs/day: 0.50    Years: 30.00    Types: Cigarettes    Quit date: 12/31/2015  . Smokeless tobacco: Never Used     Comment: not everyday-working on stopping completely   . Alcohol use No  . Drug use: No     Comment: denies  . Sexual activity: Yes    Partners: Female     Comment: Lives with his fiance.   Other Topics Concern  . None   Social History Narrative   Lives in [place] with his fiance.   Moved to [place] 11 years before. Used to live in [place] for 20 years before that.   Has 4 healthy kids from previous wife- separated now.   Is not working full-time since last 2 and half years- since early 2010.   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      Review of Systems Currently denies fever, significant chest pain, abdominal pain, back pain, nausea, vomiting or abnormal bleeding. He does have occasional headaches, occasional cough and some dyspnea with exertion.  Vital Signs: BP (!) 156/99 (BP Location: Left Arm)   Pulse 99   Temp 99.4 F (37.4 C) (Oral)   Resp 18   Ht 5' 5.5" (1.664 m)   Wt 131 lb (59.4 kg)   SpO2 100%   BMI 21.47 kg/m   Physical Exam patient awake, alert. Tracheostomy in place. Chest with clear breath sounds bilaterally.Heart with regular rate and rhythm. Abdomen soft, positive bowel sounds, nontender.Lower extremities with no edema.  Mallampati Score:     Imaging: Mr Jeri Cos [id number] Contrast  Result Date: 05/23/2016 CLINICAL DATA:  Newly diagnosed squamous cell carcinoma of the larynx. Staging. EXAM: MRI HEAD WITHOUT AND WITH CONTRAST TECHNIQUE: Multiplanar, multiecho pulse sequences of the brain and surrounding structures were obtained without and with intravenous contrast. CONTRAST:  31mL MULTIHANCE GADOBENATE  DIMEGLUMINE 529 MG/ML IV SOLN COMPARISON:  CT neck 04/17/2016.  PET scan 05/15/2016. FINDINGS: The patient was unable to remain motionless for the exam. Small or subtle lesions could be overlooked. Brain: No acute infarction, acute hemorrhage, hydrocephalus, extra-axial collection or mass lesion. Cerebral atrophy. Chronic microvascular ischemic change. Post infusion, no intracranial metastatic disease. Large enhancing tubular structures, inferior RIGHT frontal lobe, with regional encephalomalacia and hemosiderin deposition. This appears to represent sequelae of old hemorrhage secondary to venous angioma. Superficial drainage of the venous angioma appears to occur into the middle cerebral vein region. This developmental venous anomaly likely accounts for the PET scan findings of calcification and reduced metabolic activity. Vascular: Normal arterial flow voids. Skull and upper cervical spine: Normal marrow signal. Sinuses/Orbits: Negative. Other:  None. IMPRESSION: Atrophy with chronic microvascular ischemic change. No intracranial metastatic disease. 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 Electronically Signed   By: Staci Righter M.D.   On: 05/23/2016 17:15   Nm Pet Image Initial (pi) Whole Body  Result Date: 05/15/2016 CLINICAL DATA:  Initial treatment strategy for laryngeal cancer. EXAM: NUCLEAR MEDICINE PET WHOLE BODY TECHNIQUE: 6.4 mCi F-18 FDG was injected intravenously. Full-ring PET imaging was performed from the vertex to the feet after the radiotracer. CT data was obtained and used for attenuation correction and anatomic localization. FASTING BLOOD GLUCOSE:  Value: 98 mg/dl COMPARISON:  CT scan 04/17/2016 FINDINGS: HEAD/NECK Intracranially, there is abnormal calcification along the head of the right caudate nucleus, anterior limb of the right  internal capsule, and anterior portion the lentiform nucleus with some asymmetrically low PET activity in this vicinity, dedicated brain MRI with and without contrast recommended for definitive characterization. The primarily right eccentric glottic mass has a maximum SUV of 11.6, compatible with malignancy. Right station IIa lymph node measures 0.8 cm in short axis on image 53 series 3 with maximum SUV 4.2, only very faintly elevated over background. None of the other small lymph nodes are demonstrably accentuated in metabolic activity. Below the level of the laryngeal mass there is a tracheostomy tube in place. CHEST Background mediastinal blood pool activity 3.1. An AP window lymph node measuring 1.1 cm in short axis on image 87/3 has a maximum SUV of 2.9. A left axillary lymph node measuring 7 mm in short axis on image 91/3 has a maximum SUV of 3.1. ABDOMEN/PELVIS No abnormal hypermetabolic activity within the liver, pancreas, adrenal glands, or spleen. No hypermetabolic lymph nodes in the abdomen or pelvis. SKELETON No focal hypermetabolic activity to suggest skeletal metastasis. EXTREMITIES No abnormal hypermetabolic activity in the lower extremities. IMPRESSION: 1. Right eccentric glottic mass, appears to cross the anterior commissure with destructive findings of the thyroid cartilage. There is an upper normal size right station 2A lymph node with maximum SUV of 4.2 which is only very faintly elevated over background, but which could possibly represent a small amount of local metastatic spread. 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 2. Asymmetric calcification along the head of the right caudate nucleus and adjacent lentiform nucleus. Well this can sometimes be physiologic, there is some accompanying reduction in PET activity, and the asymmetry is an unusual feature. Accordingly I recommend MRI of the brain with and without contrast for  further characterization. 3. A AP window lymph node is minimally enlarged at 1.1 cm, but with a maximum SUV below background mediastinal levels, likely benign. Electronically Signed   By: Van Clines M.D.   On: 05/15/2016 14:36    Labs:  CBC:  Recent Labs  04/24/16 0919 05/26/16 0627 06/02/16 1237  WBC 7.0 7.1 8.1  HGB 15.5 12.3* 13.2  HCT 46.4 37.9* 39.2  PLT 419* 310 460*    COAGS:  Recent Labs  06/02/16 1237  INR 0.97  APTT 38*    BMP:  Recent Labs  04/24/16 0919 05/26/16 0627 06/02/16 1237  NA 141 139 137  K 4.2 3.6 4.1  CL 102 103 101  CO2 27 27 27   GLUCOSE 104* 100* 92  BUN 6 6 10   CALCIUM 9.9 9.1 9.9  CREATININE 0.78 0.93 0.86  GFRNONAA >60 >  60 >60  GFRAA >60 >60 >60    LIVER FUNCTION TESTS:  Recent Labs  04/24/16 0919 06/02/16 1237  BILITOT 0.8 0.5  AST 22 21  ALT 15* 19  ALKPHOS 67 92  PROT 8.3* 8.4*  ALBUMIN 4.4 4.4    TUMOR MARKERS: No results for input(s): AFPTM, CEA, CA199, CHROMGRNA in the last 8760 hours.  Assessment and Plan:  56 y.o. 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     Thank you for this interesting consult.  I greatly enjoyed meeting JAMEON MERAZ and look forward to participating in their care.  A copy of this report was sent to the requesting provider on this date.  Electronically Signed: D. Rowe Robert 06/02/2016, 1:28 PM   I spent a total of 20 minutes in face to face in clinical consultation, greater than 50% of which was counseling/coordinating care for Port-A-Cath placement

## 2016-06-02 NOTE — Procedures (Signed)
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 The catheter is ready for immediate use.  Estimated Blood Loss: Minimal  Complications: None immediate.  Ronny Bacon, MD Pager #: [phone]

## 2016-06-02 NOTE — Discharge Instructions (Signed)
Implanted Port Home Guide °An implanted port is a type of central line that is placed under the skin. Central lines are used to provide IV access when treatment or nutrition needs to be given through a person’s veins. Implanted ports are used for long-term IV access. An implanted port may be placed because: °· You need IV medicine that would be irritating to the small veins in your hands or arms. °· You need long-term IV medicines, such as antibiotics. °· You need IV nutrition for a long period. °· You need frequent blood draws for lab tests. °· You need dialysis. °Implanted ports are usually placed in the chest area, but they can also be placed in the upper arm, the abdomen, or the leg. An implanted port has two main parts: °· Reservoir. The reservoir is round and will appear as a small, raised area under your skin. The reservoir is the part where a needle is inserted to give medicines or draw blood. °· Catheter. The catheter is a thin, flexible tube that extends from the reservoir. The catheter is placed into a large vein. Medicine that is inserted into the reservoir goes into the catheter and then into the vein. °How will I care for my incision site? °Do not get the incision site wet. Bathe or shower as directed by your health care provider. °How is my port accessed? °Special steps must be taken to access the port: °· Before the port is accessed, a numbing cream can be placed on the skin. This helps numb the skin over the port site. °· Your health care provider uses a sterile technique to access the port. °¨ Your health care provider must put on a mask and sterile gloves. °¨ The skin over your port is cleaned carefully with an antiseptic and allowed to dry. °¨ The port is gently pinched between sterile gloves, and a needle is inserted into the port. °· Only "non-coring" port needles should be used to access the port. Once the port is accessed, a blood return should be checked. This helps ensure that the port is  in the vein and is not clogged. °· If your port needs to remain accessed for a constant infusion, a clear (transparent) bandage will be placed over the needle site. The bandage and needle will need to be changed every week, or as directed by your health care provider. °· Keep the bandage covering the needle clean and dry. Do not get it wet. Follow your health care provider’s instructions on how to take a shower or bath while the port is accessed. °· If your port does not need to stay accessed, no bandage is needed over the port. °What is flushing? °Flushing helps keep the port from getting clogged. Follow your health care provider’s instructions on how and when to flush the port. Ports are usually flushed with saline solution or a medicine called heparin. The need for flushing will depend on how the port is used. °· If the port is used for intermittent medicines or blood draws, the port will need to be flushed: °¨ After medicines have been given. °¨ After blood has been drawn. °¨ As part of routine maintenance. °· If a constant infusion is running, the port may not need to be flushed. °How long will my port stay implanted? °The port can stay in for as long as your health care provider thinks it is needed. When it is time for the port to come out, surgery will be done to remove it.   The procedure is similar to the one performed when the port was put in. °When should I seek immediate medical care? °When you have an implanted port, you should seek immediate medical care if: °· You notice a bad smell coming from the incision site. °· You have swelling, redness, or drainage at the incision site. °· You have more swelling or pain at the port site or the surrounding area. °· You have a fever that is not controlled with medicine. °This information is not intended to replace advice given to you by your health care provider. Make sure you discuss any questions you have with your health care provider. °Document Released:  03/20/2005 Document Revised: 08/26/2015 Document Reviewed: 11/25/2012 °Elsevier Interactive Patient Education © 2017 [employer] Inc[employer] °Implanted Port Insertion, Care After °This sheet gives you information about how to care for yourself after your procedure. Your health care provider may also give you more specific instructions. If you have problems or questions, contact your health care provider. °What can I expect after the procedure? °After your procedure, it is common to have: °· Discomfort at the port insertion site. °· Bruising on the skin over the port. This should improve over 3-4 days. °Follow these instructions at home: °Port care  °· After your port is placed, you will get a manufacturer's information card. The card has information about your port. Keep this card with you at all times. °· Take care of the port as told by your health care provider. Ask your health care provider if you or a family member can get training for taking care of the port at home. A home health care nurse may also take care of the port. °· Make sure to remember what type of port you have. °Incision care  °· Follow instructions from your health care provider about how to take care of your port insertion site. Make sure you: °¨ Wash your hands with soap and water before you change your bandage (dressing). If soap and water are not available, use hand sanitizer. °¨ Change your dressing as told by your health care provider. °¨ Leave stitches (sutures), skin glue, or adhesive strips in place. These skin closures may need to stay in place for 2 weeks or longer. If adhesive strip edges start to loosen and curl up, you may trim the loose edges. Do not remove adhesive strips completely unless your health care provider tells you to do that. °· Check your port insertion site every day for signs of infection. Check for: °¨ More redness, swelling, or pain. °¨ More fluid or blood. °¨ Warmth. °¨ Pus or a bad smell. °General instructions  °· Do not  take baths, swim, or use a hot tub until your health care provider approves. °· Do not lift anything that is heavier than 10 lb (4.5 kg) for a week, or as told by your health care provider. °· Ask your health care provider when it is okay to: °¨ Return to work or school. °¨ Resume usual physical activities or sports. °· Do not drive for 24 hours if you were given a medicine to help you relax (sedative). °· Take over-the-counter and prescription medicines only as told by your health care provider. °· Wear a medical alert bracelet in case of an emergency. This will tell any health care providers that you have a port. °· Keep all follow-up visits as told by your health care provider. This is important. °Contact a health care provider if: °· You cannot flush your port   with saline as directed, or you cannot draw blood from the port. °· You have a fever or chills. °· You have more redness, swelling, or pain around your port insertion site. °· You have more fluid or blood coming from your port insertion site. °· Your port insertion site feels warm to the touch. °· You have pus or a bad smell coming from the port insertion site. °Get help right away if: °· You have chest pain or shortness of breath. °· You have bleeding from your port that you cannot control. °Summary °· Take care of the port as told by your health care provider. °· Change your dressing as told by your health care provider. °· Keep all follow-up visits as told by your health care provider. °This information is not intended to replace advice given to you by your health care provider. Make sure you discuss any questions you have with your health care provider. °Document Released: 01/08/2013 Document Revised: 02/09/2016 Document Reviewed: 02/09/2016 °Elsevier Interactive Patient Education © 2017 [employer] Inc[employer] °Moderate Conscious Sedation, Adult, Care After °These instructions provide you with information about caring for yourself after your procedure. Your  health care provider may also give you more specific instructions. Your treatment has been planned according to current medical practices, but problems sometimes occur. Call your health care provider if you have any problems or questions after your procedure. °What can I expect after the procedure? °After your procedure, it is common: °· To feel sleepy for several hours. °· To feel clumsy and have poor balance for several hours. °· To have poor judgment for several hours. °· To vomit if you eat too soon. °Follow these instructions at home: °For at least 24 hours after the procedure:  ° °· Do not: °¨ Participate in activities where you could fall or become injured. °¨ Drive. °¨ Use heavy machinery. °¨ Drink alcohol. °¨ Take sleeping pills or medicines that cause drowsiness. °¨ Make important decisions or sign legal documents. °¨ Take care of children on your own. °· Rest. °Eating and drinking  °· Follow the diet recommended by your health care provider. °· If you vomit: °¨ Drink water, juice, or soup when you can drink without vomiting. °¨ Make sure you have little or no nausea before eating solid foods. °General instructions  °· Have a responsible adult stay with you until you are awake and alert. °· Take over-the-counter and prescription medicines only as told by your health care provider. °· If you smoke, do not smoke without supervision. °· Keep all follow-up visits as told by your health care provider. This is important. °Contact a health care provider if: °· You keep feeling nauseous or you keep vomiting. °· You feel light-headed. °· You develop a rash. °· You have a fever. °Get help right away if: °· You have trouble breathing. °This information is not intended to replace advice given to you by your health care provider. Make sure you discuss any questions you have with your health care provider. °Document Released: 01/08/2013 Document Revised: 08/23/2015 Document Reviewed: 07/10/2015 °Elsevier Interactive  Patient Education © 2017 Elsevier Inc. ° °

## 2016-06-05 ENCOUNTER — Ambulatory Visit (HOSPITAL_COMMUNITY): Payer: Self-pay | Admitting: Dentistry

## 2016-06-05 ENCOUNTER — Encounter (HOSPITAL_COMMUNITY): Payer: Self-pay | Admitting: Dentistry

## 2016-06-05 ENCOUNTER — Encounter: Payer: Self-pay | Admitting: *Deleted

## 2016-06-05 VITALS — BP 140/92 | HR 100 | Temp 98.6°F

## 2016-06-05 DIAGNOSIS — K08199 Complete loss of teeth due to other specified cause, unspecified class: Secondary | ICD-10-CM

## 2016-06-05 DIAGNOSIS — K0820 Unspecified atrophy of edentulous alveolar ridge: Secondary | ICD-10-CM

## 2016-06-05 DIAGNOSIS — Z930 Tracheostomy status: Secondary | ICD-10-CM

## 2016-06-05 DIAGNOSIS — Z01818 Encounter for other preprocedural examination: Secondary | ICD-10-CM

## 2016-06-05 DIAGNOSIS — K08109 Complete loss of teeth, unspecified cause, unspecified class: Secondary | ICD-10-CM

## 2016-06-05 DIAGNOSIS — C321 Malignant neoplasm of supraglottis: Secondary | ICD-10-CM

## 2016-06-05 DIAGNOSIS — C329 Malignant neoplasm of larynx, unspecified: Secondary | ICD-10-CM

## 2016-06-05 NOTE — Progress Notes (Signed)
Oncology Nurse Navigator Documentation  Met with Mr. Bitting following his appt with Dr. Enrique Sack, Dental Medicine.  Provided him [facility] kit and box of split gauze pending supply reinstatement with [facility].  [name], RN, BSN, [place] Oncology Nurse [place] [facility] [place] [phone]

## 2016-06-05 NOTE — Patient Instructions (Addendum)
PLAN: 1. Continue salt water rinses as needed to aid healing. 2. Brush tongue daily. 3. Advance diet as tolerated. 4. Patient cleared for start of induction chemotherapy on Friday, June 19, 2016. 5. Follow-up with a dentist of his choice for fabrication of upper lower complete dentures approximately 3 months after the last radiation therapy has been provided.  Lenn Cal, DDS

## 2016-06-05 NOTE — Progress Notes (Signed)
POST OPERATIVE NOTE:  06/05/2016 Jonathan [name] [id number]  VITALS: There were no vitals taken for this visit.  LABS:  Lab Results  Component Value Date   WBC 8.1 06/02/2016   HGB 13.2 06/02/2016   HCT 39.2 06/02/2016   MCV 88.5 06/02/2016   PLT 460 (H) 06/02/2016   BMET    Component Value Date/Time   NA 137 06/02/2016 1237   K 4.1 06/02/2016 1237   CL 101 06/02/2016 1237   CO2 27 06/02/2016 1237   GLUCOSE 92 06/02/2016 1237   BUN 10 06/02/2016 1237   CREATININE 0.86 06/02/2016 1237   CREATININE 0.98 01/09/2014 1534   CALCIUM 9.9 06/02/2016 1237   GFRNONAA >60 06/02/2016 1237   GFRNONAA 82 03/06/2011 0853   GFRAA >60 06/02/2016 1237   GFRAA >89 03/06/2011 0853    Lab Results  Component Value Date   INR 0.97 06/02/2016   No results found for: PTT   Jonathan [name] is status post extraction of remaining teeth with alveoloplasty and pre-prosthetic surgery as needed in the operating room on 05/26/2016. Patient now presents for evaluation of healing and suture removal.  SUBJECTIVE: Patient with minimal complaints. Patient indicates that several stitches still remain.  EXAM: There is no sign of infection, heme, or ooze. Patient is healing in by generalized primary closure. Sutures are loosely present. Patient is now edentulous. 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  PROCEDURE: The patient was given a chlorhexidine gluconate rinse for 30 seconds. Sutures were then removed without complication. Patient tolerated the procedure well.  ASSESSMENT: Post operative course is consistent with dental procedures performed in the operating room. 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 Continue salt water rinses as needed to aid healing. 2. Brush tongue daily. 3. Advance diet as tolerated. 4. Patient cleared for start of induction chemotherapy on Friday, June 19, 2016. 5. Follow-up with a dentist of  his choice for fabrication of upper lower complete dentures approximately 3 months after the last radiation therapy has been provided.  Lenn Cal, DDS

## 2016-06-07 ENCOUNTER — Other Ambulatory Visit: Payer: Self-pay | Admitting: Hematology and Oncology

## 2016-06-07 ENCOUNTER — Telehealth: Payer: Self-pay | Admitting: *Deleted

## 2016-06-07 DIAGNOSIS — G893 Neoplasm related pain (acute) (chronic): Secondary | ICD-10-CM

## 2016-06-07 DIAGNOSIS — C321 Malignant neoplasm of supraglottis: Secondary | ICD-10-CM

## 2016-06-07 MED ORDER — MORPHINE SULFATE (CONCENTRATE) 10 MG /0.5 ML PO SOLN: 10.0000 mg | ORAL | 0 refills | Status: DC | PRN

## 2016-06-07 NOTE — Telephone Encounter (Signed)
Notified of instructions regarding new pain medicine.Verbalized understanding

## 2016-06-07 NOTE — Telephone Encounter (Signed)
Oncology Nurse Navigator Documentation  Received email from Golden Hurter, [facility], indicating patient's audiology exam scheduled for 1:00 PM today.  Dr. Alvy Bimler informed.  [name], RN, BSN, [place] On[facility] Nurse [place] [facility] [place] [phone]

## 2016-06-07 NOTE — Telephone Encounter (Signed)
Wife called to say he needs refill of liquid ibuprofen and percocet

## 2016-06-07 NOTE — Telephone Encounter (Signed)
Tell him to stop ibuprofen due to risk of kidney problem Tell him to stop percocet. I am starting him on liquid morphine sulfate which is stronger He can still take tylenol as needed

## 2016-06-08 MED FILL — MORPHINE SULF 100 MG/5 ML S: 100 | 40 days supply | Qty: 240 | Fill #0

## 2016-06-09 ENCOUNTER — Ambulatory Visit (HOSPITAL_BASED_OUTPATIENT_CLINIC_OR_DEPARTMENT_OTHER): Payer: No Typology Code available for payment source

## 2016-06-09 VITALS — BP 140/98 | HR 90 | Temp 98.5°F | Resp 20

## 2016-06-09 DIAGNOSIS — C321 Malignant neoplasm of supraglottis: Secondary | ICD-10-CM

## 2016-06-09 DIAGNOSIS — Z5111 Encounter for antineoplastic chemotherapy: Secondary | ICD-10-CM

## 2016-06-09 MED ORDER — DOCETAXEL CHEMO INJECTION 160 MG/16ML
75.0000 mg/m2 | Freq: Once | INTRAVENOUS | Status: AC
  Administered 2016-06-09: 120 mg via INTRAVENOUS
  Filled 2016-06-09: qty 12

## 2016-06-09 MED ORDER — HEPARIN SOD (PORK) LOCK FLUSH 100 UNIT/ML IV SOLN
500.0000 [IU] | Freq: Once | INTRAVENOUS | Status: DC | PRN
  Filled 2016-06-09: qty 5

## 2016-06-09 MED ORDER — SODIUM CHLORIDE 0.9 % IV SOLN
Freq: Once | INTRAVENOUS | Status: AC
  Administered 2016-06-09: 11:00:00 via INTRAVENOUS
  Filled 2016-06-09: qty 5

## 2016-06-09 MED ORDER — SODIUM CHLORIDE 0.9 % IV SOLN
750.0000 mg/m2/d | INTRAVENOUS | Status: DC
  Administered 2016-06-09: 4900 mg via INTRAVENOUS
  Filled 2016-06-09: qty 98

## 2016-06-09 MED ORDER — POTASSIUM CHLORIDE 2 MEQ/ML IV SOLN
Freq: Once | INTRAVENOUS | Status: AC
  Administered 2016-06-09: 09:00:00 via INTRAVENOUS
  Filled 2016-06-09: qty 10

## 2016-06-09 MED ORDER — SODIUM CHLORIDE 0.9% FLUSH
10.0000 mL | INTRAVENOUS | Status: DC | PRN
  Filled 2016-06-09: qty 10

## 2016-06-09 MED ORDER — PALONOSETRON HCL INJECTION 0.25 MG/5ML
INTRAVENOUS | Status: AC
  Filled 2016-06-09: qty 5

## 2016-06-09 MED ORDER — SODIUM CHLORIDE 0.9 % IV SOLN
Freq: Once | INTRAVENOUS | Status: AC
  Administered 2016-06-09: 11:00:00 via INTRAVENOUS

## 2016-06-09 MED ORDER — SODIUM CHLORIDE 0.9 % IV SOLN
75.0000 mg/m2 | Freq: Once | INTRAVENOUS | Status: AC
  Administered 2016-06-09: 123 mg via INTRAVENOUS
  Filled 2016-06-09: qty 123

## 2016-06-09 MED ORDER — PALONOSETRON HCL INJECTION 0.25 MG/5ML
0.2500 mg | Freq: Once | INTRAVENOUS | Status: AC
  Administered 2016-06-09: 0.25 mg via INTRAVENOUS

## 2016-06-09 NOTE — Patient Instructions (Signed)
[facility] Discharge Instructions for Patients Receiving Chemotherapy  Today you received the following chemotherapy agents Taxotere, Cisplatin and Adrucil   To help prevent nausea and vomiting after your treatment, we encourage you to take your nausea medication as directed. No Zofran for 3 days. Take Compazine instead.    If you develop nausea and vomiting that is not controlled by your nausea medication, call the clinic.   BELOW ARE SYMPTOMS THAT SHOULD BE REPORTED IMMEDIATELY:  *FEVER GREATER THAN 100.5 F  *CHILLS WITH OR WITHOUT FEVER  NAUSEA AND VOMITING THAT IS NOT CONTROLLED WITH YOUR NAUSEA MEDICATION  *UNUSUAL SHORTNESS OF BREATH  *UNUSUAL BRUISING OR BLEEDING  TENDERNESS IN MOUTH AND THROAT WITH OR WITHOUT PRESENCE OF ULCERS  *URINARY PROBLEMS  *BOWEL PROBLEMS  UNUSUAL RASH Items with * indicate a potential emergency and should be followed up as soon as possible.  Feel free to call the clinic you have any questions or concerns. The clinic phone number is [phone].  Please show the [place] at check-in to the Emergency Department and triage nurse.  Docetaxel injection What is this medicine? DOCETAXEL (doe se TAX el) is a chemotherapy drug. It targets fast dividing cells, like cancer cells, and causes these cells to die. This medicine is used to treat many types of cancers like breast cancer, certain stomach cancers, head and neck cancer, lung cancer, and prostate cancer. This medicine may be used for other purposes; ask your health care provider or pharmacist if you have questions. COMMON BRAND NAME(S): Docefrez, Taxotere What should I tell my health care provider before I take this medicine? They need to know if you have any of these conditions: -infection (especially a virus infection such as chickenpox, cold sores, or herpes) -liver disease -low blood counts, like low white cell, platelet, or red cell counts -an unusual or allergic  reaction to docetaxel, polysorbate 80, other chemotherapy agents, other medicines, foods, dyes, or preservatives -pregnant or trying to get pregnant -breast-feeding How should I use this medicine? This drug is given as an infusion into a vein. It is administered in a hospital or clinic by a specially trained health care professional. Talk to your pediatrician regarding the use of this medicine in children. Special care may be needed. Overdosage: If you think you have taken too much of this medicine contact a poison control center or emergency room at once. NOTE: This medicine is only for you. Do not share this medicine with others. What if I miss a dose? It is important not to miss your dose. Call your doctor or health care professional if you are unable to keep an appointment. What may interact with this medicine? -cyclosporine -erythromycin -ketoconazole -medicines to increase blood counts like filgrastim, pegfilgrastim, sargramostim -vaccines Talk to your doctor or health care professional before taking any of these medicines: -acetaminophen -aspirin -ibuprofen -ketoprofen -naproxen This list may not describe all possible interactions. Give your health care provider a list of all the medicines, herbs, non-prescription drugs, or dietary supplements you use. Also tell them if you smoke, drink alcohol, or use illegal drugs. Some items may interact with your medicine. What should I watch for while using this medicine? Your condition will be monitored carefully while you are receiving this medicine. You will need important blood work done while you are taking this medicine. This drug may make you feel generally unwell. This is not uncommon, as chemotherapy can affect healthy cells as well as cancer cells. Report any side  effects. Continue your course of treatment even though you feel ill unless your doctor tells you to stop. In some cases, you may be given additional medicines to help with  side effects. Follow all directions for their use. Call your doctor or health care professional for advice if you get a fever, chills or sore throat, or other symptoms of a cold or flu. Do not treat yourself. This drug decreases your body's ability to fight infections. Try to avoid being around people who are sick. This medicine may increase your risk to bruise or bleed. Call your doctor or health care professional if you notice any unusual bleeding. This medicine may contain alcohol in the product. You may get drowsy or dizzy. Do not drive, use machinery, or do anything that needs mental alertness until you know how this medicine affects you. Do not stand or sit up quickly, especially if you are an older patient. This reduces the risk of dizzy or fainting spells. Avoid alcoholic drinks. Do not become pregnant while taking this medicine. Women should inform their doctor if they wish to become pregnant or think they might be pregnant. There is a potential for serious side effects to an unborn child. Talk to your health care professional or pharmacist for more information. Do not breast-feed an infant while taking this medicine. What side effects may I notice from receiving this medicine? Side effects that you should report to your doctor or health care professional as soon as possible: -allergic reactions like skin rash, itching or hives, swelling of the face, lips, or tongue -low blood counts - This drug may decrease the number of white blood cells, red blood cells and platelets. You may be at increased risk for infections and bleeding. -signs of infection - fever or chills, cough, sore throat, pain or difficulty passing urine -signs of decreased platelets or bleeding - bruising, pinpoint red spots on the skin, black, tarry stools, nosebleeds -signs of decreased red blood cells - unusually weak or tired, fainting spells, lightheadedness -breathing problems -fast or irregular heartbeat -low blood  pressure -mouth sores -nausea and vomiting -pain, swelling, redness or irritation at the injection site -pain, tingling, numbness in the hands or feet -swelling of the ankle, feet, hands -weight gain Side effects that usually do not require medical attention (report to your doctor or health care professional if they continue or are bothersome): -bone pain -complete hair loss including hair on your head, underarms, pubic hair, eyebrows, and eyelashes -diarrhea -excessive tearing -changes in the color of fingernails -loosening of the fingernails -nausea -muscle pain -red flush to skin -sweating -weak or tired This list may not describe all possible side effects. Call your doctor for medical advice about side effects. You may report side effects to FDA at 1-800-FDA-1088. Where should I keep my medicine? This drug is given in a hospital or clinic and will not be stored at home. NOTE: This sheet is a summary. It may not cover all possible information. If you have questions about this medicine, talk to your doctor, pharmacist, or health care provider.  2018 Elsevier/Gold Standard (2015-04-22 12:32:56)  Cisplatin injection What is this medicine? CISPLATIN (SIS pla tin) is a chemotherapy drug. It targets fast dividing cells, like cancer cells, and causes these cells to die. This medicine is used to treat many types of cancer like bladder, ovarian, and testicular cancers. This medicine may be used for other purposes; ask your health care provider or pharmacist if you have questions. COMMON BRAND NAME(S): Platinol,  Platinol -AQ What should I tell my health care provider before I take this medicine? They need to know if you have any of these conditions: -blood disorders -hearing problems -kidney disease -recent or ongoing radiation therapy -an unusual or allergic reaction to cisplatin, carboplatin, other chemotherapy, other medicines, foods, dyes, or preservatives -pregnant or trying to  get pregnant -breast-feeding How should I use this medicine? This drug is given as an infusion into a vein. It is administered in a hospital or clinic by a specially trained health care professional. Talk to your pediatrician regarding the use of this medicine in children. Special care may be needed. Overdosage: If you think you have taken too much of this medicine contact a poison control center or emergency room at once. NOTE: This medicine is only for you. Do not share this medicine with others. What if I miss a dose? It is important not to miss a dose. Call your doctor or health care professional if you are unable to keep an appointment. What may interact with this medicine? -dofetilide -foscarnet -medicines for seizures -medicines to increase blood counts like filgrastim, pegfilgrastim, sargramostim -probenecid -pyridoxine used with altretamine -rituximab -some antibiotics like amikacin, gentamicin, neomycin, polymyxin B, streptomycin, tobramycin -sulfinpyrazone -vaccines -zalcitabine Talk to your doctor or health care professional before taking any of these medicines: -acetaminophen -aspirin -ibuprofen -ketoprofen -naproxen This list may not describe all possible interactions. Give your health care provider a list of all the medicines, herbs, non-prescription drugs, or dietary supplements you use. Also tell them if you smoke, drink alcohol, or use illegal drugs. Some items may interact with your medicine. What should I watch for while using this medicine? Your condition will be monitored carefully while you are receiving this medicine. You will need important blood work done while you are taking this medicine. This drug may make you feel generally unwell. This is not uncommon, as chemotherapy can affect healthy cells as well as cancer cells. Report any side effects. Continue your course of treatment even though you feel ill unless your doctor tells you to stop. In some cases, you  may be given additional medicines to help with side effects. Follow all directions for their use. Call your doctor or health care professional for advice if you get a fever, chills or sore throat, or other symptoms of a cold or flu. Do not treat yourself. This drug decreases your body's ability to fight infections. Try to avoid being around people who are sick. This medicine may increase your risk to bruise or bleed. Call your doctor or health care professional if you notice any unusual bleeding. Be careful brushing and flossing your teeth or using a toothpick because you may get an infection or bleed more easily. If you have any dental work done, tell your dentist you are receiving this medicine. Avoid taking products that contain aspirin, acetaminophen, ibuprofen, naproxen, or ketoprofen unless instructed by your doctor. These medicines may hide a fever. Do not become pregnant while taking this medicine. Women should inform their doctor if they wish to become pregnant or think they might be pregnant. There is a potential for serious side effects to an unborn child. Talk to your health care professional or pharmacist for more information. Do not breast-feed an infant while taking this medicine. Drink fluids as directed while you are taking this medicine. This will help protect your kidneys. Call your doctor or health care professional if you get diarrhea. Do not treat yourself. What side effects may I notice  from receiving this medicine? Side effects that you should report to your doctor or health care professional as soon as possible: -allergic reactions like skin rash, itching or hives, swelling of the face, lips, or tongue -signs of infection - fever or chills, cough, sore throat, pain or difficulty passing urine -signs of decreased platelets or bleeding - bruising, pinpoint red spots on the skin, black, tarry stools, nosebleeds -signs of decreased red blood cells - unusually weak or tired, fainting  spells, lightheadedness -breathing problems -changes in hearing -gout pain -low blood counts - This drug may decrease the number of white blood cells, red blood cells and platelets. You may be at increased risk for infections and bleeding. -nausea and vomiting -pain, swelling, redness or irritation at the injection site -pain, tingling, numbness in the hands or feet -problems with balance, movement -trouble passing urine or change in the amount of urine Side effects that usually do not require medical attention (report to your doctor or health care professional if they continue or are bothersome): -changes in vision -loss of appetite -metallic taste in the mouth or changes in taste This list may not describe all possible side effects. Call your doctor for medical advice about side effects. You may report side effects to FDA at 1-800-FDA-1088. Where should I keep my medicine? This drug is given in a hospital or clinic and will not be stored at home. NOTE: This sheet is a summary. It may not cover all possible information. If you have questions about this medicine, talk to your doctor, pharmacist, or health care provider.  2018 Elsevier/Gold Standard (2007-06-25 14:40:54)  Fluorouracil, 5-FU injection What is this medicine? FLUOROURACIL, 5-FU (flure oh YOOR a sil) is a chemotherapy drug. It slows the growth of cancer cells. This medicine is used to treat many types of cancer like breast cancer, colon or rectal cancer, pancreatic cancer, and stomach cancer. This medicine may be used for other purposes; ask your health care provider or pharmacist if you have questions. COMMON BRAND NAME(S): Adrucil What should I tell my health care provider before I take this medicine? They need to know if you have any of these conditions: -blood disorders -dihydropyrimidine dehydrogenase (DPD) deficiency -infection (especially a virus infection such as chickenpox, cold sores, or herpes) -kidney  disease -liver disease -malnourished, poor nutrition -recent or ongoing radiation therapy -an unusual or allergic reaction to fluorouracil, other chemotherapy, other medicines, foods, dyes, or preservatives -pregnant or trying to get pregnant -breast-feeding How should I use this medicine? This drug is given as an infusion or injection into a vein. It is administered in a hospital or clinic by a specially trained health care professional. Talk to your pediatrician regarding the use of this medicine in children. Special care may be needed. Overdosage: If you think you have taken too much of this medicine contact a poison control center or emergency room at once. NOTE: This medicine is only for you. Do not share this medicine with others. What if I miss a dose? It is important not to miss your dose. Call your doctor or health care professional if you are unable to keep an appointment. What may interact with this medicine? -allopurinol -cimetidine -dapsone -digoxin -hydroxyurea -leucovorin -levamisole -medicines for seizures like ethotoin, fosphenytoin, phenytoin -medicines to increase blood counts like filgrastim, pegfilgrastim, sargramostim -medicines that treat or prevent blood clots like warfarin, enoxaparin, and dalteparin -methotrexate -metronidazole -pyrimethamine -some other chemotherapy drugs like busulfan, cisplatin, estramustine, vinblastine -trimethoprim -trimetrexate -vaccines Talk to your doctor or  health care professional before taking any of these medicines: -acetaminophen -aspirin -ibuprofen -ketoprofen -naproxen This list may not describe all possible interactions. Give your health care provider a list of all the medicines, herbs, non-prescription drugs, or dietary supplements you use. Also tell them if you smoke, drink alcohol, or use illegal drugs. Some items may interact with your medicine. What should I watch for while using this medicine? Visit your doctor  for checks on your progress. This drug may make you feel generally unwell. This is not uncommon, as chemotherapy can affect healthy cells as well as cancer cells. Report any side effects. Continue your course of treatment even though you feel ill unless your doctor tells you to stop. In some cases, you may be given additional medicines to help with side effects. Follow all directions for their use. Call your doctor or health care professional for advice if you get a fever, chills or sore throat, or other symptoms of a cold or flu. Do not treat yourself. This drug decreases your body's ability to fight infections. Try to avoid being around people who are sick. This medicine may increase your risk to bruise or bleed. Call your doctor or health care professional if you notice any unusual bleeding. Be careful brushing and flossing your teeth or using a toothpick because you may get an infection or bleed more easily. If you have any dental work done, tell your dentist you are receiving this medicine. Avoid taking products that contain aspirin, acetaminophen, ibuprofen, naproxen, or ketoprofen unless instructed by your doctor. These medicines may hide a fever. Do not become pregnant while taking this medicine. Women should inform their doctor if they wish to become pregnant or think they might be pregnant. There is a potential for serious side effects to an unborn child. Talk to your health care professional or pharmacist for more information. Do not breast-feed an infant while taking this medicine. Men should inform their doctor if they wish to father a child. This medicine may lower sperm counts. Do not treat diarrhea with over the counter products. Contact your doctor if you have diarrhea that lasts more than 2 days or if it is severe and watery. This medicine can make you more sensitive to the sun. Keep out of the sun. If you cannot avoid being in the sun, wear protective clothing and use sunscreen. Do not  use sun lamps or tanning beds/booths. What side effects may I notice from receiving this medicine? Side effects that you should report to your doctor or health care professional as soon as possible: -allergic reactions like skin rash, itching or hives, swelling of the face, lips, or tongue -low blood counts - this medicine may decrease the number of white blood cells, red blood cells and platelets. You may be at increased risk for infections and bleeding. -signs of infection - fever or chills, cough, sore throat, pain or difficulty passing urine -signs of decreased platelets or bleeding - bruising, pinpoint red spots on the skin, black, tarry stools, blood in the urine -signs of decreased red blood cells - unusually weak or tired, fainting spells, lightheadedness -breathing problems -changes in vision -chest pain -mouth sores -nausea and vomiting -pain, swelling, redness at site where injected -pain, tingling, numbness in the hands or feet -redness, swelling, or sores on hands or feet -stomach pain -unusual bleeding Side effects that usually do not require medical attention (report to your doctor or health care professional if they continue or are bothersome): -changes in finger or  toe nails -diarrhea -dry or itchy skin -hair loss -headache -loss of appetite -sensitivity of eyes to the light -stomach upset -unusually teary eyes This list may not describe all possible side effects. Call your doctor for medical advice about side effects. You may report side effects to FDA at 1-800-FDA-1088. Where should I keep my medicine? This drug is given in a hospital or clinic and will not be stored at home. NOTE: This sheet is a summary. It may not cover all possible information. If you have questions about this medicine, talk to your doctor, pharmacist, or health care provider.  2018 Elsevier/Gold Standard (2007-07-24 13:53:16)

## 2016-06-12 ENCOUNTER — Telehealth: Payer: Self-pay

## 2016-06-12 NOTE — Telephone Encounter (Signed)
Called to see how he is doing after 1st chemo treatment. States he is doing okay and he will see Dr. Alvy Bimler tomorrow.

## 2016-06-13 ENCOUNTER — Ambulatory Visit (HOSPITAL_BASED_OUTPATIENT_CLINIC_OR_DEPARTMENT_OTHER): Payer: No Typology Code available for payment source

## 2016-06-13 ENCOUNTER — Ambulatory Visit (HOSPITAL_BASED_OUTPATIENT_CLINIC_OR_DEPARTMENT_OTHER): Payer: No Typology Code available for payment source | Admitting: Hematology and Oncology

## 2016-06-13 DIAGNOSIS — C321 Malignant neoplasm of supraglottis: Secondary | ICD-10-CM

## 2016-06-13 DIAGNOSIS — Z720 Tobacco use: Secondary | ICD-10-CM

## 2016-06-13 DIAGNOSIS — Z930 Tracheostomy status: Secondary | ICD-10-CM

## 2016-06-13 DIAGNOSIS — R042 Hemoptysis: Secondary | ICD-10-CM

## 2016-06-13 DIAGNOSIS — K5903 Drug induced constipation: Secondary | ICD-10-CM

## 2016-06-13 DIAGNOSIS — Z5189 Encounter for other specified aftercare: Secondary | ICD-10-CM

## 2016-06-13 DIAGNOSIS — G893 Neoplasm related pain (acute) (chronic): Secondary | ICD-10-CM

## 2016-06-13 DIAGNOSIS — F17200 Nicotine dependence, unspecified, uncomplicated: Secondary | ICD-10-CM

## 2016-06-13 DIAGNOSIS — Z452 Encounter for adjustment and management of vascular access device: Secondary | ICD-10-CM

## 2016-06-13 MED ORDER — SODIUM CHLORIDE 0.9% FLUSH
10.0000 mL | INTRAVENOUS | Status: DC | PRN
Start: 2016-06-13 — End: 2016-06-13
  Administered 2016-06-13: 10 mL
  Filled 2016-06-13: qty 10

## 2016-06-13 MED ORDER — PEGFILGRASTIM INJECTION 6 MG/0.6ML ~~LOC~~
6.0000 mg | PREFILLED_SYRINGE | Freq: Once | SUBCUTANEOUS | Status: AC
  Administered 2016-06-13: 6 mg via SUBCUTANEOUS
  Filled 2016-06-13: qty 0.6

## 2016-06-13 MED ORDER — HEPARIN SOD (PORK) LOCK FLUSH 100 UNIT/ML IV SOLN
500.0000 [IU] | Freq: Once | INTRAVENOUS | Status: AC | PRN
  Administered 2016-06-13: 500 [IU]
  Filled 2016-06-13: qty 5

## 2016-06-13 NOTE — Patient Instructions (Signed)
Pegfilgrastim injection What is this medicine? PEGFILGRASTIM (PEG fil gra stim) is a long-acting granulocyte colony-stimulating factor that stimulates the growth of neutrophils, a type of white blood cell important in the body's fight against infection. It is used to reduce the incidence of fever and infection in patients with certain types of cancer who are receiving chemotherapy that affects the bone marrow, and to increase survival after being exposed to high doses of radiation. This medicine may be used for other purposes; ask your health care provider or pharmacist if you have questions. COMMON BRAND NAME(S): Neulasta What should I tell my health care provider before I take this medicine? They need to know if you have any of these conditions: -kidney disease -latex allergy -ongoing radiation therapy -sickle cell disease -skin reactions to acrylic adhesives (On-Body Injector only) -an unusual or allergic reaction to pegfilgrastim, filgrastim, other medicines, foods, dyes, or preservatives -pregnant or trying to get pregnant -breast-feeding How should I use this medicine? This medicine is for injection under the skin. If you get this medicine at home, you will be taught how to prepare and give the pre-filled syringe or how to use the On-body Injector. Refer to the patient Instructions for Use for detailed instructions. Use exactly as directed. Tell your healthcare provider immediately if you suspect that the On-body Injector may not have performed as intended or if you suspect the use of the On-body Injector resulted in a missed or partial dose. It is important that you put your used needles and syringes in a special sharps container. Do not put them in a trash can. If you do not have a sharps container, call your pharmacist or healthcare provider to get one. Talk to your pediatrician regarding the use of this medicine in children. While this drug may be prescribed for selected conditions,  precautions do apply. Overdosage: If you think you have taken too much of this medicine contact a poison control center or emergency room at once. NOTE: This medicine is only for you. Do not share this medicine with others. What if I miss a dose? It is important not to miss your dose. Call your doctor or health care professional if you miss your dose. If you miss a dose due to an On-body Injector failure or leakage, a new dose should be administered as soon as possible using a single prefilled syringe for manual use. What may interact with this medicine? Interactions have not been studied. Give your health care provider a list of all the medicines, herbs, non-prescription drugs, or dietary supplements you use. Also tell them if you smoke, drink alcohol, or use illegal drugs. Some items may interact with your medicine. This list may not describe all possible interactions. Give your health care provider a list of all the medicines, herbs, non-prescription drugs, or dietary supplements you use. Also tell them if you smoke, drink alcohol, or use illegal drugs. Some items may interact with your medicine. What should I watch for while using this medicine? You may need blood work done while you are taking this medicine. If you are going to need a MRI, CT scan, or other procedure, tell your doctor that you are using this medicine (On-Body Injector only). What side effects may I notice from receiving this medicine? Side effects that you should report to your doctor or health care professional as soon as possible: -allergic reactions like skin rash, itching or hives, swelling of the face, lips, or tongue -dizziness -fever -pain, redness, or irritation at site   where injected -pinpoint red spots on the skin -red or dark-[place] urine -shortness of breath or breathing problems -stomach or side pain, or pain at the shoulder -swelling -tiredness -trouble passing urine or change in the amount of urine Side  effects that usually do not require medical attention (report to your doctor or health care professional if they continue or are bothersome): -bone pain -muscle pain This list may not describe all possible side effects. Call your doctor for medical advice about side effects. You may report side effects to FDA at 1-800-FDA-1088. Where should I keep my medicine? Keep out of the reach of children. Store pre-filled syringes in a refrigerator between 2 and 8 degrees C (36 and 46 degrees F). Do not freeze. Keep in carton to protect from light. Throw away this medicine if it is left out of the refrigerator for more than 48 hours. Throw away any unused medicine after the expiration date. NOTE: This sheet is a summary. It may not cover all possible information. If you have questions about this medicine, talk to your doctor, pharmacist, or health care provider.  2018 [employer] (2016-03-16 12:58:03)  

## 2016-06-14 ENCOUNTER — Encounter: Payer: Self-pay | Admitting: Hematology and Oncology

## 2016-06-14 DIAGNOSIS — R042 Hemoptysis: Secondary | ICD-10-CM | POA: Insufficient documentation

## 2016-06-14 NOTE — Assessment & Plan Note (Signed)
He tolerated treatment well so far. I was continue to see him on a weekly basis for toxicity review. We have extensive discussion about the importance of tobacco cessation.

## 2016-06-14 NOTE — Assessment & Plan Note (Signed)
He has mild intermittent hemoptysis when he tried to cough forcefully. It could be due to positive response to treatment. The amount is scant. I recommend observation only for now

## 2016-06-14 NOTE — Progress Notes (Signed)
[place] OFFICE PROGRESS NOTE  Patient Care Team: Velna Ochs, MD as PCP - General Leota Sauers, RN as Oncology Nurse Navigator Eppie Gibson, MD as Attending Physician (Radiation Oncology)  SUMMARY OF ONCOLOGIC HISTORY:   Malignant neoplasm of supraglottis ([place])   04/17/2016 Imaging    Ct neck showed relatively large approximately 3.5 cm laryngeal mass most compatible with laryngeal carcinoma. Epicenter at the glottis on the right. Extension across midline as well as involvement of laryngeal cartilages (possibly also bilateral). 2. Relatively small but malignant appearing right level 2 lymphadenopathy. Suspicious contralateral left level 2 lymph nodes.      04/21/2016 Initial Diagnosis    He saw Dr. Constance Holster in the clinic for chronic hoarseness. In the office, it was noted that the posterior soft palate, uvula, tongue base and vallecula were visualized and appeared healthy without mucosal masses or lesions. There is a large mass involving the right side of the larynx, the right cord is fixed. There may be involvement of the post cricoid area it is difficult to say for sure. 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 Clinically this is at least staged as T3. The airway is significantly impaired. He is at risk for impending airway obstruction. ENT recommended him for awake tracheostomy with direct laryngoscopy and biopsy and esophagoscopy under anesthesia immediately following that.       04/24/2016 Pathology Results    Larynx, biopsy, Right Side - POSITIVE FOR INVASIVE SQUAMOUS CELL CARCINOMA. - SEE COMMENT. Microscopic Comment As sampled, the squamous cell carcinoma appears well differentiated. A fragment of benign cartilage is also present in the specimen.      04/24/2016 Surgery    She underwent direct laryngoscopy with biopsy. An anterior commissure scope was used to evaluate the larynx. A large mass and, using the right  side of the larynx and the anterior commissure area was identified. Multiple biopsies were taken. The left cord was swollen but seemed to be uninvolved. Piriforms appeared to be clear. The post cricoid area appeared clear. Esophagoscopy was difficult and ENT surgeon was unable to pass the scope into the esophagus. He had tracheostomy      04/24/2016 Pathology Results    Larynx, biopsy, Right Side - POSITIVE FOR INVASIVE SQUAMOUS CELL CARCINOMA. - SEE COMMENT. Microscopic Comment As sampled, the squamous cell carcinoma appears well differentiated. A fragment of benign cartilage is also present in the specimen.       05/15/2016 PET scan    Right eccentric glottic mass, appears to cross the anterior commissure with destructive findings of the thyroid cartilage. There is an upper normal size right station 2A lymph node with maximum SUV of 4.2 which is only very faintly elevated over background, but which could possibly represent a small amount of local metastatic spread. 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 2. Asymmetric calcification along the head of the right caudate nucleus and adjacent lentiform nucleus. Well this can sometimes be physiologic, there is some accompanying reduction in PET activity, and the asymmetry is an unusual feature. Accordingly I recommend MRI of the brain with and without contrast for further characterization. 3. A AP window lymph node is minimally enlarged at 1.1 cm, but with a maximum SUV below background mediastinal levels, likely benign.      05/23/2016 Imaging    MRI brain: Atrophy with chronic microvascular ischemic change. No  intracranial metastatic disease. 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       05/26/2016 Surgery    Multiple  extraction of tooth numbers 2, 5, 6, 8-13, and  21-28. 2. 4 Quadrants of alveoloplasty. 3. Bilateral mandibular lingual tori reductions 4. Maxillary right, maxillary left, and mandibular left buccal exostoses reductions      06/02/2016 Procedure    Successful placement of a right internal jugular approach power injectable Port-A-Cath. The catheter is ready for immediate use      06/09/2016 -  Chemotherapy    He received induction chemotherapy with taxotere, cisplatin and 5 FU       INTERVAL HISTORY: Please see below for problem oriented charting. He returns for supportive care.  He is accompanied by his girlfriend/wife. He started smoking randomly several cigarettes per day. His pain appears to be better control with recent prescription morphine sulfate. He had recent severe constipation, resolved with laxatives. Denies any onset of nausea or mucositis. He had mild intermittent hemoptysis, a small amount. He denies tinnitus or neuropathy from recent treatment  REVIEW OF SYSTEMS:   Constitutional: Denies fevers, chills or abnormal weight loss Eyes: Denies blurriness of vision Ears, nose, mouth, throat, and face: Denies mucositis or sore throat Respiratory: Denies cough, dyspnea or wheezes Cardiovascular: Denies palpitation, chest discomfort or lower extremity swelling Skin: Denies abnormal skin rashes Lymphatics: Denies new lymphadenopathy or easy bruising Neurological:Denies numbness, tingling or new weaknesses Behavioral/Psych: Mood is stable, no new changes  All other systems were reviewed with the patient and are negative.  I have reviewed the past medical history, past surgical history, social history and family history with the patient and they are unchanged from previous note.  ALLERGIES:  is allergic to no known allergies.  MEDICATIONS:  Current Outpatient Prescriptions  Medication Sig Dispense Refill  . dexamethasone (DECADRON) 4 MG tablet Take 2 tablets (8 mg total) by  mouth 2 (two) times daily. Start the day before Taxotere. Take once the day after, then 2 times a day x 2d. 30 tablet 1  . lidocaine-prilocaine (EMLA) cream Apply to affected area once 30 g 3  . Morphine Sulfate (MORPHINE CONCENTRATE) 10 mg / 0.5 ml concentrated solution Take 0.5 mLs (10 mg total) by mouth every 2 (two) hours as needed for severe pain. 240 mL 0  . nicotine (NICODERM CQ - DOSED IN MG/24 HR) 7 mg/24hr patch Place 1 patch (7 mg total) onto the skin daily. 14 patch 0  . prochlorperazine (COMPAZINE) 10 MG tablet Take 1 tablet (10 mg total) by mouth every 6 (six) hours as needed (Nausea or vomiting). 30 tablet 1  . sennosides-docusate sodium (SENOKOT-S) 8.6-50 MG tablet Take 2 tablets by mouth daily. 60 tablet 0  . LORazepam (ATIVAN) 0.5 MG tablet Take 1-2 tablets 30 min before MRI and before mask fabrication to help with claustrophobia. (Patient not taking: Reported on 06/13/2016) 4 tablet 0  . ondansetron (ZOFRAN) 8 MG tablet Take 1 tablet (8 mg total) by mouth 2 (two) times daily as needed (Nausea or vomiting). Begin 4 days after chemotherapy. (Patient not taking: Reported on 06/13/2016) 30 tablet 1  . promethazine (PHENERGAN) 25 MG suppository Place 1 suppository (25 mg total) rectally every 6 (six) hours as needed for nausea. (Patient not taking: Reported on 05/15/2016) 12 each 0   No current facility-administered medications for this visit.     PHYSICAL EXAMINATION:  ECOG PERFORMANCE STATUS: 1 - Symptomatic but completely ambulatory  Vitals:   06/13/16 1434  BP: (!) 127/33  Pulse: (!) 106  Resp: 19  Temp: 99.8 F (37.7 C)   Filed Weights   06/13/16 1434  Weight: 129 lb 1.6 oz (58.6 kg)    GENERAL:alert, no distress and comfortable SKIN: skin color, texture, turgor are normal, no rashes or significant lesions EYES: normal, Conjunctiva are pink and non-injected, sclera clear OROPHARYNX:no exudate, no erythema and lips, buccal mucosa, and tongue normal  NECK: Tracheostomy  site looks okay  LYMPH:  no palpable lymphadenopathy in the cervical, axillary or inguinal LUNGS: clear to auscultation and percussion with normal breathing effort HEART: regular rate & rhythm and no murmurs and no lower extremity edema ABDOMEN:abdomen soft, non-tender and normal bowel sounds Musculoskeletal:no cyanosis of digits and no clubbing  NEURO: alert & oriented x 3 with fluent speech, no focal motor/sensory deficits  LABORATORY DATA:  I have reviewed the data as listed    Component Value Date/Time   NA 137 06/02/2016 1237   K 4.1 06/02/2016 1237   CL 101 06/02/2016 1237   CO2 27 06/02/2016 1237   GLUCOSE 92 06/02/2016 1237   BUN 10 06/02/2016 1237   CREATININE 0.86 06/02/2016 1237   CREATININE 0.98 01/09/2014 1534   CALCIUM 9.9 06/02/2016 1237   PROT 8.4 (H) 06/02/2016 1237   ALBUMIN 4.4 06/02/2016 1237   AST 21 06/02/2016 1237   ALT 19 06/02/2016 1237   ALKPHOS 92 06/02/2016 1237   BILITOT 0.5 06/02/2016 1237   GFRNONAA >60 06/02/2016 1237   GFRNONAA 82 03/06/2011 0853   GFRAA >60 06/02/2016 1237   GFRAA >89 03/06/2011 0853    No results found for: SPEP, UPEP  Lab Results  Component Value Date   WBC 8.1 06/02/2016   NEUTROABS 4.5 01/09/2014   HGB 13.2 06/02/2016   HCT 39.2 06/02/2016   MCV 88.5 06/02/2016   PLT 460 (H) 06/02/2016      Chemistry      Component Value Date/Time   NA 137 06/02/2016 1237   K 4.1 06/02/2016 1237   CL 101 06/02/2016 1237   CO2 27 06/02/2016 1237   BUN 10 06/02/2016 1237   CREATININE 0.86 06/02/2016 1237   CREATININE 0.98 01/09/2014 1534      Component Value Date/Time   CALCIUM 9.9 06/02/2016 1237   ALKPHOS 92 06/02/2016 1237   AST 21 06/02/2016 1237   ALT 19 06/02/2016 1237   BILITOT 0.5 06/02/2016 1237       RADIOGRAPHIC STUDIES: I have personally reviewed the radiological images as listed and agreed with the findings in the report. Mr Jeri Cos Wo Contrast  Result Date: 05/23/2016 CLINICAL DATA:  Newly  diagnosed squamous cell carcinoma of the larynx. Staging. EXAM: MRI HEAD WITHOUT AND WITH CONTRAST TECHNIQUE: Multiplanar, multiecho pulse sequences of the brain and surrounding structures were obtained without and with intravenous contrast. CONTRAST:  2m MULTIHANCE GADOBENATE DIMEGLUMINE 529 MG/ML IV SOLN COMPARISON:  CT neck 04/17/2016.  PET scan 05/15/2016. FINDINGS: The patient was unable to remain motionless for the exam. Small or subtle lesions could be overlooked. Brain: No acute infarction, acute hemorrhage, hydrocephalus, extra-axial collection or mass lesion. Cerebral atrophy. Chronic microvascular ischemic change. Post infusion, no intracranial metastatic disease. Large enhancing tubular structures, inferior RIGHT frontal lobe, with regional encephalomalacia and hemosiderin deposition. This appears to represent sequelae of old hemorrhage secondary to venous angioma. Superficial drainage of the venous angioma appears to occur into  the middle cerebral vein region. This developmental venous anomaly likely accounts for the PET scan findings of calcification and reduced metabolic activity. Vascular: Normal arterial flow voids. Skull and upper cervical spine: Normal marrow signal. Sinuses/Orbits: Negative. Other: None. IMPRESSION: Atrophy with chronic microvascular ischemic change. No intracranial metastatic disease. 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 Electronically Signed   By: Staci Righter M.D.   On: 05/23/2016 17:15   Nm Pet Image Initial (pi) Whole Body  Result Date: 05/15/2016 CLINICAL DATA:  Initial treatment strategy for laryngeal cancer. EXAM: NUCLEAR MEDICINE PET WHOLE BODY TECHNIQUE: 6.4 mCi F-18 FDG was injected intravenously. Full-ring PET imaging was performed from the vertex to the feet after the radiotracer. CT data was obtained  and used for attenuation correction and anatomic localization. FASTING BLOOD GLUCOSE:  Value: 98 mg/dl COMPARISON:  CT scan 04/17/2016 FINDINGS: HEAD/NECK Intracranially, there is abnormal calcification along the head of the right caudate nucleus, anterior limb of the right internal capsule, and anterior portion the lentiform nucleus with some asymmetrically low PET activity in this vicinity, dedicated brain MRI with and without contrast recommended for definitive characterization. The primarily right eccentric glottic mass has a maximum SUV of 11.6, compatible with malignancy. Right station IIa lymph node measures 0.8 cm in short axis on image 53 series 3 with maximum SUV 4.2, only very faintly elevated over background. None of the other small lymph nodes are demonstrably accentuated in metabolic activity. Below the level of the laryngeal mass there is a tracheostomy tube in place. CHEST Background mediastinal blood pool activity 3.1. An AP window lymph node measuring 1.1 cm in short axis on image 87/3 has a maximum SUV of 2.9. A left axillary lymph node measuring 7 mm in short axis on image 91/3 has a maximum SUV of 3.1. ABDOMEN/PELVIS No abnormal hypermetabolic activity within the liver, pancreas, adrenal glands, or spleen. No hypermetabolic lymph nodes in the abdomen or pelvis. SKELETON No focal hypermetabolic activity to suggest skeletal metastasis. EXTREMITIES No abnormal hypermetabolic activity in the lower extremities. IMPRESSION: 1. Right eccentric glottic mass, appears to cross the anterior commissure with destructive findings of the thyroid cartilage. There is an upper normal size right station 2A lymph node with maximum SUV of 4.2 which is only very faintly elevated over background, but which could possibly represent a small amount of local metastatic spread. 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 2. Asymmetric calcification along the  head of the right caudate nucleus and adjacent lentiform nucleus. Well this can sometimes be physiologic, there is some accompanying reduction in PET activity, and the asymmetry is an unusual feature. Accordingly I recommend MRI of the brain with and without contrast for further characterization. 3. A AP window lymph node is minimally enlarged at 1.1 cm, but with a maximum SUV below background mediastinal levels, likely benign. Electronically Signed   By: Van Clines M.D.   On: 05/15/2016 14:36   Ir US Guide Vasc Access Right  Result Date: 06/02/2016 INDICATION: History of malignant neoplasm of the supraglottis. In need of durable intravenous access for chemotherapy administration. EXAM: IMPLANTED PORT A CATH PLACEMENT WITH ULTRASOUND AND FLUOROSCOPIC GUIDANCE COMPARISON:  PET-CT - 05/15/2016 MEDICATIONS: Ancef 2 gm IV; The antibiotic was administered within an appropriate time interval prior to skin puncture. ANESTHESIA/SEDATION: Moderate (conscious) sedation was employed during this  procedure. A total of Versed 4 mg and Fentanyl 50 mcg was administered intravenously. Moderate Sedation Time: 23 minutes. The patient's level of consciousness and vital signs were monitored continuously by radiology nursing throughout the procedure under my direct supervision. CONTRAST:  None FLUOROSCOPY TIME:  18 seconds (3 mGy) COMPLICATIONS: None immediate. PROCEDURE: The procedure, risks, benefits, and alternatives were explained to the patient. Questions regarding the procedure were encouraged and answered. The patient understands and consents to the procedure. The right neck and chest were prepped with chlorhexidine in a sterile fashion, and a sterile drape was applied covering the operative field. Maximum barrier sterile technique with sterile gowns and gloves were used for the procedure. A timeout was performed prior to the initiation of the procedure. Local anesthesia was provided with 1% lidocaine with epinephrine.  After creating a small venotomy incision, a micropuncture kit was utilized to access the internal jugular vein under direct, real-time ultrasound guidance. Ultrasound image documentation was performed. The microwire was kinked to measure appropriate catheter length. A subcutaneous port pocket was then created along the upper chest wall utilizing a combination of sharp and blunt dissection. The pocket was irrigated with sterile saline. A single lumen thin power injectable port was chosen for placement. The 8 Fr catheter was tunneled from the port pocket site to the venotomy incision. The port was placed in the pocket. The external catheter was trimmed to appropriate length. At the venotomy, an 8 Fr peel-away sheath was placed over a guidewire under fluoroscopic guidance. The catheter was then placed through the sheath and the sheath was removed. Final catheter positioning was confirmed and documented with a fluoroscopic spot radiograph. The port was accessed with a Huber needle, aspirated and flushed with heparinized saline. The venotomy site was closed with an interrupted 4-0 Vicryl suture. The port pocket incision was closed with interrupted 2-0 Vicryl suture and the skin was opposed with a running subcuticular 4-0 Vicryl suture. Dermabond and Steri-strips were applied to both incisions. Dressings were placed. The patient tolerated the procedure well without immediate post procedural complication. FINDINGS: After catheter placement, the tip lies within the superior cavoatrial junction. The catheter aspirates and flushes normally and is ready for immediate use. IMPRESSION: Successful placement of a right internal jugular approach power injectable Port-A-Cath. The catheter is ready for immediate use. Electronically Signed   By: Sandi Mariscal M.D.   On: 06/02/2016 15:50   Ir Fluoro Guide Port Insertion Right  Result Date: 06/02/2016 INDICATION: History of malignant neoplasm of the supraglottis. In need of durable  intravenous access for chemotherapy administration. EXAM: IMPLANTED PORT A CATH PLACEMENT WITH ULTRASOUND AND FLUOROSCOPIC GUIDANCE COMPARISON:  PET-CT - 05/15/2016 MEDICATIONS: Ancef 2 gm IV; The antibiotic was administered within an appropriate time interval prior to skin puncture. ANESTHESIA/SEDATION: Moderate (conscious) sedation was employed during this procedure. A total of Versed 4 mg and Fentanyl 50 mcg was administered intravenously. Moderate Sedation Time: 23 minutes. The patient's level of consciousness and vital signs were monitored continuously by radiology nursing throughout the procedure under my direct supervision. CONTRAST:  None FLUOROSCOPY TIME:  18 seconds (3 mGy) COMPLICATIONS: None immediate. PROCEDURE: The procedure, risks, benefits, and alternatives were explained to the patient. Questions regarding the procedure were encouraged and answered. The patient understands and consents to the procedure. The right neck and chest were prepped with chlorhexidine in a sterile fashion, and a sterile drape was applied covering the operative field. Maximum barrier sterile technique with sterile gowns and gloves were used for the  procedure. A timeout was performed prior to the initiation of the procedure. Local anesthesia was provided with 1% lidocaine with epinephrine. After creating a small venotomy incision, a micropuncture kit was utilized to access the internal jugular vein under direct, real-time ultrasound guidance. Ultrasound image documentation was performed. The microwire was kinked to measure appropriate catheter length. A subcutaneous port pocket was then created along the upper chest wall utilizing a combination of sharp and blunt dissection. The pocket was irrigated with sterile saline. A single lumen thin power injectable port was chosen for placement. The 8 Fr catheter was tunneled from the port pocket site to the venotomy incision. The port was placed in the pocket. The external catheter  was trimmed to appropriate length. At the venotomy, an 8 Fr peel-away sheath was placed over a guidewire under fluoroscopic guidance. The catheter was then placed through the sheath and the sheath was removed. Final catheter positioning was confirmed and documented with a fluoroscopic spot radiograph. The port was accessed with a Huber needle, aspirated and flushed with heparinized saline. The venotomy site was closed with an interrupted 4-0 Vicryl suture. The port pocket incision was closed with interrupted 2-0 Vicryl suture and the skin was opposed with a running subcuticular 4-0 Vicryl suture. Dermabond and Steri-strips were applied to both incisions. Dressings were placed. The patient tolerated the procedure well without immediate post procedural complication. FINDINGS: After catheter placement, the tip lies within the superior cavoatrial junction. The catheter aspirates and flushes normally and is ready for immediate use. IMPRESSION: Successful placement of a right internal jugular approach power injectable Port-A-Cath. The catheter is ready for immediate use. Electronically Signed   By: Sandi Mariscal M.D.   On: 06/02/2016 15:50    ASSESSMENT & PLAN:  Malignant neoplasm of supraglottis ([place]) He tolerated treatment well so far. I was continue to see him on a weekly basis for toxicity review. We have extensive discussion about the importance of tobacco cessation.  Status post tracheostomy [facility]) The tracheostomy site looks good.   Cancer associated pain His pain is well controlled right now with current prescription pain medicine.  We discussed narcotic refill policy  Constipation He has severe constipation.  I reinforced the importance of regular use of laxatives  Tobacco use disorder The patient has started smoking recently.  I recommend tapering him off nicotine patch. We discussed the importance of tobacco cessation  Hemoptysis He has mild intermittent hemoptysis when he tried to cough  forcefully. It could be due to positive response to treatment. The amount is scant. I recommend observation only for now   No orders of the defined types were placed in this encounter.  All questions were answered. The patient knows to call the clinic with any problems, questions or concerns. No barriers to learning was detected. I spent 25 minutes counseling the patient face to face. The total time spent in the appointment was 30 minutes and more than 50% was on counseling and review of test results     Heath Lark, MD 06/14/2016 7:08 AM

## 2016-06-14 NOTE — Assessment & Plan Note (Signed)
The tracheostomy site looks good.

## 2016-06-14 NOTE — Assessment & Plan Note (Signed)
He has severe constipation.  I reinforced the importance of regular use of laxatives

## 2016-06-14 NOTE — Assessment & Plan Note (Signed)
The patient has started smoking recently.  I recommend tapering him off nicotine patch. We discussed the importance of tobacco cessation

## 2016-06-14 NOTE — Assessment & Plan Note (Signed)
His pain is well controlled right now with current prescription pain medicine.  We discussed narcotic refill policy

## 2016-06-15 ENCOUNTER — Encounter: Payer: Self-pay | Admitting: Internal Medicine

## 2016-06-15 ENCOUNTER — Ambulatory Visit (INDEPENDENT_AMBULATORY_CARE_PROVIDER_SITE_OTHER): Payer: No Typology Code available for payment source | Admitting: Internal Medicine

## 2016-06-15 VITALS — BP 132/93 | HR 99 | Temp 98.7°F | Wt 123.2 lb

## 2016-06-15 DIAGNOSIS — R64 Cachexia: Secondary | ICD-10-CM

## 2016-06-15 DIAGNOSIS — C321 Malignant neoplasm of supraglottis: Secondary | ICD-10-CM

## 2016-06-15 DIAGNOSIS — Z95828 Presence of other vascular implants and grafts: Secondary | ICD-10-CM

## 2016-06-15 DIAGNOSIS — Z6820 Body mass index (BMI) 20.0-20.9, adult: Secondary | ICD-10-CM

## 2016-06-15 DIAGNOSIS — F17211 Nicotine dependence, cigarettes, in remission: Secondary | ICD-10-CM

## 2016-06-15 DIAGNOSIS — Z930 Tracheostomy status: Secondary | ICD-10-CM

## 2016-06-15 NOTE — Assessment & Plan Note (Addendum)
Patient is here today for routine follow up. 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 Patient has opted for chemotherapy (Cisplatin, Fluorouracil, and Docetaxel) and radiation rather than surgical resection in hopes of preserving what is left of his voice. He follow with Dr. Alvy Bimler and started induction chemotherapy this past Friday. He reports some side effects of malaise and nausea after his chemo treatment but is otherwise tolerating therapy well. His pain is managed per oncology and he reports it is well controlled on his current regimen. He is prescribed a liquid morphine concentrate q2 prn but says he never takes it that often, usually only a couple times a day. He also had some narcotic induced constipation when he started the medication but this is now controlled on prn docusate and senna. He reports poor appetite and his weight is down 8 lbs from just two weeks ago. I encouraged patient to keep up his nutrition as best as possible. He has been trying boost shakes recently. He does not have a feeding tube and is still taking nutrition by mouth. Given his multiple teeth extractions, we will follow closely for stomatitis. He may eventually require a feeding tube. We will defer this to oncology for now.  -- F/u 3 months or sooner as needed  -- Appreciate oncology's assistance, will continue to follow along

## 2016-06-15 NOTE — Progress Notes (Signed)
   CC: Laryngeal Carcinoma   HPI:  [name] is a 56 y.o. M with past medical history outlined below here for routine follow up.  For the details of today's visit please refer to the assessment and plan.  Past Medical History:  Diagnosis Date  . Allergic rhinitis   . Arthritis    Bilateral knee arthritis for about 20 years. Not taking any medications, believes in nature processes  . Cancer (HCC)    larynx  . Colon polyp   . Dental caries    prechemoradiation therapy, periodontitis  . GERD (gastroesophageal reflux disease)   . Headache   . HLD (hyperlipidemia)     Review of Systems:  All pertinents listed in HPI, otherwise negative  Physical Exam:  Vitals:   06/15/16 1434  BP: (!) 132/93  Pulse: 99  Temp: 98.7 F (37.1 C)  TempSrc: Oral  SpO2: 99%  Weight: 123 lb 3.2 oz (55.9 kg)    Constitutional: Thin cachetic appearing gentleman  HEENT: Multiple teeth extractions, normal oral mucosa   Neck: Trach collar  Cardiovascular: RRR Pulmonary/Chest: CTAB, breathing comfortably on room air, Right chest wall Port-A-Cath  Extremities: Warm and well perfused. Distal pulses intact. No edema.  Neurological: A&Ox3, CN II - XII grossly intact.    Assessment & Plan:   See Encounters Tab for problem based charting.  Patient discussed with Dr. Eppie Gibson

## 2016-06-15 NOTE — Patient Instructions (Signed)
Mr. Cragg,  It was a pleasure to see you today. I am glad you are doing well! Please follow up in 3 months or soon if you need [place]. If you have any questions or concerns, please feel free to reach out to our clinic at [phone] or after hours call [phone] and ask for the internal medicine resident on call. Thank you!

## 2016-06-15 NOTE — Progress Notes (Signed)
Case discussed with Dr. Guilloud at the time of the visit. We reviewed the resident's history and exam and pertinent patient test results. I agree with the assessment, diagnosis, and plan of care documented in the resident's note. 

## 2016-06-20 ENCOUNTER — Encounter: Payer: Self-pay | Admitting: *Deleted

## 2016-06-20 ENCOUNTER — Ambulatory Visit (HOSPITAL_BASED_OUTPATIENT_CLINIC_OR_DEPARTMENT_OTHER): Payer: No Typology Code available for payment source | Admitting: Hematology and Oncology

## 2016-06-20 ENCOUNTER — Encounter: Payer: Self-pay | Admitting: Nutrition

## 2016-06-20 ENCOUNTER — Telehealth: Payer: Self-pay | Admitting: Hematology and Oncology

## 2016-06-20 DIAGNOSIS — G893 Neoplasm related pain (acute) (chronic): Secondary | ICD-10-CM

## 2016-06-20 DIAGNOSIS — Z930 Tracheostomy status: Secondary | ICD-10-CM

## 2016-06-20 DIAGNOSIS — C321 Malignant neoplasm of supraglottis: Secondary | ICD-10-CM

## 2016-06-20 DIAGNOSIS — K5903 Drug induced constipation: Secondary | ICD-10-CM

## 2016-06-20 NOTE — Telephone Encounter (Signed)
Gave patient avs report and appointments for March and April. Per patient/navigator pump d/c should be 4/3 not 4/4

## 2016-06-20 NOTE — Progress Notes (Signed)
Provided second complimentary case of Ensure Plus. 

## 2016-06-22 ENCOUNTER — Encounter: Payer: Self-pay | Admitting: Hematology and Oncology

## 2016-06-22 NOTE — Progress Notes (Signed)
Met with Mr. Pietrzyk during Established Patient appt with Dr. Alvy Bimler.  He was accompanied by his wife. They voiced understanding of chemo start next Friday with initiation of pump, pump to be stopped the following Tuesday. He was encouraged to continue oral intake of nutritional drinks, to maintain oral intake of high calorie/protein soft foods. I obtained case of Ensure Plus and Boost/Ensure coupons for him from [place].  [name], RN, BSN, [place] [facility] On[facility] Nurse [place] [facility] [place] [phone]

## 2016-06-22 NOTE — Progress Notes (Signed)
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 Epicenter at the glottis on the right. Extension across midline as well as involvement of laryngeal cartilages (possibly also bilateral). 2. Relatively small but malignant appearing right level 2 lymphadenopathy. Suspicious contralateral left level 2 lymph nodes.      04/21/2016 Initial Diagnosis    He saw Dr. Constance Holster in the clinic for chronic hoarseness. In the office, it was noted that the posterior soft palate, uvula, tongue base and vallecula were visualized and appeared healthy without mucosal masses or lesions. There is a large mass involving the right side of the larynx, the right cord is fixed. There may be involvement of the post cricoid area it is difficult to say for sure. 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 Clinically this is at least staged as T3. The airway is significantly impaired. He is at risk for impending airway obstruction. ENT recommended him for awake tracheostomy with direct laryngoscopy and biopsy and esophagoscopy under anesthesia immediately following that.       04/24/2016 Pathology Results    Larynx, biopsy, Right Side - POSITIVE FOR INVASIVE SQUAMOUS CELL CARCINOMA. - SEE COMMENT. Microscopic Comment As sampled, the squamous cell carcinoma appears well differentiated. A fragment of benign cartilage is also present in the specimen.      04/24/2016 Surgery    She underwent direct laryngoscopy with biopsy. An anterior commissure scope was used to evaluate the larynx. A large mass and, using the right  side of the larynx and the anterior commissure area was identified. Multiple biopsies were taken. The left cord was swollen but seemed to be uninvolved. Piriforms appeared to be clear. The post cricoid area appeared clear. Esophagoscopy was difficult and ENT surgeon was unable to pass the scope into the esophagus. He had tracheostomy      04/24/2016 Pathology Results    Larynx, biopsy, Right Side - POSITIVE FOR INVASIVE SQUAMOUS CELL CARCINOMA. - SEE COMMENT. Microscopic Comment As sampled, the squamous cell carcinoma appears well differentiated. A fragment of benign cartilage is also present in the specimen.       05/15/2016 PET scan    Right eccentric glottic mass, appears to cross the anterior commissure with destructive findings of the thyroid cartilage. There is an upper normal size right station 2A lymph node with maximum SUV of 4.2 which is only very faintly elevated over background, but which could possibly represent a small amount of local metastatic spread. 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 2. Asymmetric calcification along the head of the right caudate nucleus and adjacent lentiform nucleus. Well this can sometimes be physiologic, there is some accompanying reduction in PET activity, and the asymmetry is an unusual feature. Accordingly I recommend MRI of the brain with and without contrast for further characterization. 3. A AP window lymph node is minimally enlarged at 1.1 cm, but with a maximum SUV below background mediastinal levels, likely benign.      05/23/2016 Imaging    MRI brain: Atrophy with chronic microvascular ischemic change. No  intracranial metastatic disease. 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       05/26/2016 Surgery    Multiple  extraction of tooth numbers 2, 5, 6, 8-13, and  21-28. 2. 4 Quadrants of alveoloplasty. 3. Bilateral mandibular lingual tori reductions 4. Maxillary right, maxillary left, and mandibular left buccal exostoses reductions      06/02/2016 Procedure    Successful placement of a right internal jugular approach power injectable Port-A-Cath. The catheter is ready for immediate use      06/09/2016 -  Chemotherapy    He received induction chemotherapy with taxotere, cisplatin and 5 FU       INTERVAL HISTORY: Please see below for problem oriented charting. He returns for further follow-up with his girlfriend. He has quit smoking completely. Constipation has resolved with aggressive laxative therapy. He is using less pain medicine.  Denies recent hemoptysis. No mucositis, nausea or vomiting  REVIEW OF SYSTEMS:   Constitutional: Denies fevers, chills or abnormal weight loss Eyes: Denies blurriness of vision Ears, nose, mouth, throat, and face: Denies mucositis or sore throat Respiratory: Denies cough, dyspnea or wheezes Cardiovascular: Denies palpitation, chest discomfort or lower extremity swelling Gastrointestinal:  Denies nausea, heartburn or change in bowel habits Skin: Denies abnormal skin rashes Lymphatics: Denies new lymphadenopathy or easy bruising Neurological:Denies numbness, tingling or new weaknesses Behavioral/Psych: Mood is stable, no new changes  All other systems were reviewed with the patient and are negative.  I have reviewed the past medical history, past surgical history, social history and family history with the patient and they are unchanged from previous note.  ALLERGIES:  is allergic to no known allergies.  MEDICATIONS:  Current Outpatient Prescriptions  Medication Sig Dispense Refill  . dexamethasone (DECADRON) 4 MG tablet Take 2 tablets (8 mg total) by mouth 2 (two) times daily. Start the day before Taxotere. Take once the day after, then 2 times a day x 2d. 30  tablet 1  . lidocaine-prilocaine (EMLA) cream Apply to affected area once 30 g 3  . Morphine Sulfate (MORPHINE CONCENTRATE) 10 mg / 0.5 ml concentrated solution Take 0.5 mLs (10 mg total) by mouth every 2 (two) hours as needed for severe pain. 240 mL 0  . nicotine (NICODERM CQ - DOSED IN MG/24 HR) 7 mg/24hr patch Place 1 patch (7 mg total) onto the skin daily. 14 patch 0  . ondansetron (ZOFRAN) 8 MG tablet Take 1 tablet (8 mg total) by mouth 2 (two) times daily as needed (Nausea or vomiting). Begin 4 days after chemotherapy. 30 tablet 1  . prochlorperazine (COMPAZINE) 10 MG tablet Take 1 tablet (10 mg total) by mouth every 6 (six) hours as needed (Nausea or vomiting). 30 tablet 1  . sennosides-docusate sodium (SENOKOT-S) 8.6-50 MG tablet Take 2 tablets by mouth daily. 60 tablet 0  . LORazepam (ATIVAN) 0.5 MG tablet Take 1-2 tablets 30 min before MRI and before mask fabrication to help with claustrophobia. (Patient not taking: Reported on 06/13/2016) 4 tablet 0  . promethazine (PHENERGAN) 25 MG suppository Place 1 suppository (25 mg total) rectally every 6 (six) hours as needed for nausea. (Patient not taking: Reported on 05/15/2016) 12 each 0   No current facility-administered medications for this visit.     PHYSICAL EXAMINATION: ECOG PERFORMANCE STATUS: 1 - Symptomatic but completely ambulatory  Vitals:   06/20/16 1446  BP: 121/77  Pulse: 95  Resp: 18  Temp: 98.6 F (37 C)   Filed Weights   06/20/16 1446  Weight: 126 lb 8 oz (57.4 kg)    GENERAL:alert, no distress and comfortable SKIN: skin color, texture, turgor are normal, no rashes or significant lesions EYES: normal, Conjunctiva are pink and non-injected, sclera clear OROPHARYNX:no exudate, no erythema and lips, buccal mucosa, and tongue normal  NECK: Tracheostomy site looks okay LYMPH:  no palpable lymphadenopathy in the cervical, axillary or inguinal LUNGS: clear to auscultation and percussion with normal breathing  effort HEART: regular rate & rhythm and no murmurs and no lower extremity edema ABDOMEN:abdomen soft, non-tender and normal bowel sounds Musculoskeletal:no cyanosis of digits and no clubbing  NEURO: alert & oriented x 3 with fluent speech, no focal motor/sensory deficits  LABORATORY DATA:  I have reviewed the data as listed    Component Value Date/Time   NA 137 06/02/2016 1237   K 4.1 06/02/2016 1237   CL 101 06/02/2016 1237   CO2 27 06/02/2016 1237   GLUCOSE 92 06/02/2016 1237   BUN 10 06/02/2016 1237   CREATININE 0.86 06/02/2016 1237   CREATININE 0.98 01/09/2014 1534   CALCIUM 9.9 06/02/2016 1237   PROT 8.4 (H) 06/02/2016 1237   ALBUMIN 4.4 06/02/2016 1237   AST 21 06/02/2016 1237   ALT 19 06/02/2016 1237   ALKPHOS 92 06/02/2016 1237   BILITOT 0.5 06/02/2016 1237   GFRNONAA >60 06/02/2016 1237   GFRNONAA 82 03/06/2011 0853   GFRAA >60 06/02/2016 1237   GFRAA >89 03/06/2011 0853    No results found for: SPEP, UPEP  Lab Results  Component Value Date   WBC 8.1 06/02/2016   NEUTROABS 4.5 01/09/2014   HGB 13.2 06/02/2016   HCT 39.2 06/02/2016   MCV 88.5 06/02/2016   PLT 460 (H) 06/02/2016      Chemistry      Component Value Date/Time   NA 137 06/02/2016 1237   K 4.1 06/02/2016 1237   CL 101 06/02/2016 1237   CO2 27 06/02/2016 1237   BUN 10 06/02/2016 1237   CREATININE 0.86 06/02/2016 1237   CREATININE 0.98 01/09/2014 1534      Component Value Date/Time   CALCIUM 9.9 06/02/2016 1237   ALKPHOS 92 06/02/2016 1237   AST 21 06/02/2016 1237   ALT 19 06/02/2016 1237   BILITOT 0.5 06/02/2016 1237       RADIOGRAPHIC STUDIES: I have personally reviewed the radiological images as listed and agreed with the findings in the report. Mr Jeri Cos Wo Contrast  Result Date: 05/23/2016 CLINICAL DATA:  Newly diagnosed squamous cell carcinoma of the larynx. Staging. EXAM: MRI HEAD WITHOUT AND WITH CONTRAST TECHNIQUE: Multiplanar, multiecho pulse sequences of the brain and  surrounding structures were obtained without and with intravenous contrast. CONTRAST:  27m MULTIHANCE GADOBENATE DIMEGLUMINE 529 MG/ML IV SOLN COMPARISON:  CT neck 04/17/2016.  PET scan 05/15/2016. FINDINGS: The patient was unable to remain motionless for the exam. Small or subtle lesions could be overlooked. Brain: No acute infarction, acute hemorrhage, hydrocephalus, extra-axial collection or mass lesion. Cerebral atrophy. Chronic microvascular ischemic change. Post infusion, no intracranial metastatic disease. Large enhancing tubular structures, inferior RIGHT frontal lobe, with regional encephalomalacia and hemosiderin deposition. This appears to represent sequelae of old hemorrhage secondary to venous angioma. Superficial drainage of the venous angioma appears to occur into the middle cerebral vein region. This developmental venous anomaly likely accounts for the PET scan findings of calcification and reduced metabolic activity. Vascular: Normal arterial flow voids. Skull  and upper cervical spine: Normal marrow signal. Sinuses/Orbits: Negative. Other: None. IMPRESSION: Atrophy with chronic microvascular ischemic change. No intracranial metastatic disease. 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 Electronically Signed   By: Staci Righter M.D.   On: 05/23/2016 17:15   Ir US Guide Vasc Access Right  Result Date: 06/02/2016 INDICATION: History of malignant neoplasm of the supraglottis. In need of durable intravenous access for chemotherapy administration. EXAM: IMPLANTED PORT A CATH PLACEMENT WITH ULTRASOUND AND FLUOROSCOPIC GUIDANCE COMPARISON:  PET-CT - 05/15/2016 MEDICATIONS: Ancef 2 gm IV; The antibiotic was administered within an appropriate time interval prior to skin puncture. ANESTHESIA/SEDATION: Moderate (conscious) sedation was employed during this  procedure. A total of Versed 4 mg and Fentanyl 50 mcg was administered intravenously. Moderate Sedation Time: 23 minutes. The patient's level of consciousness and vital signs were monitored continuously by radiology nursing throughout the procedure under my direct supervision. CONTRAST:  None FLUOROSCOPY TIME:  18 seconds (3 mGy) COMPLICATIONS: None immediate. PROCEDURE: The procedure, risks, benefits, and alternatives were explained to the patient. Questions regarding the procedure were encouraged and answered. The patient understands and consents to the procedure. The right neck and chest were prepped with chlorhexidine in a sterile fashion, and a sterile drape was applied covering the operative field. Maximum barrier sterile technique with sterile gowns and gloves were used for the procedure. A timeout was performed prior to the initiation of the procedure. Local anesthesia was provided with 1% lidocaine with epinephrine. After creating a small venotomy incision, a micropuncture kit was utilized to access the internal jugular vein under direct, real-time ultrasound guidance. Ultrasound image documentation was performed. The microwire was kinked to measure appropriate catheter length. A subcutaneous port pocket was then created along the upper chest wall utilizing a combination of sharp and blunt dissection. The pocket was irrigated with sterile saline. A single lumen thin power injectable port was chosen for placement. The 8 Fr catheter was tunneled from the port pocket site to the venotomy incision. The port was placed in the pocket. The external catheter was trimmed to appropriate length. At the venotomy, an 8 Fr peel-away sheath was placed over a guidewire under fluoroscopic guidance. The catheter was then placed through the sheath and the sheath was removed. Final catheter positioning was confirmed and documented with a fluoroscopic spot radiograph. The port was accessed with a Huber needle, aspirated and  flushed with heparinized saline. The venotomy site was closed with an interrupted 4-0 Vicryl suture. The port pocket incision was closed with interrupted 2-0 Vicryl suture and the skin was opposed with a running subcuticular 4-0 Vicryl suture. Dermabond and Steri-strips were applied to both incisions. Dressings were placed. The patient tolerated the procedure well without immediate post procedural complication. FINDINGS: After catheter placement, the tip lies within the superior cavoatrial junction. The catheter aspirates and flushes normally and is ready for immediate use. IMPRESSION: Successful placement of a right internal jugular approach power injectable Port-A-Cath. The catheter is ready for immediate use. Electronically Signed   By: Sandi Mariscal M.D.   On: 06/02/2016 15:50   Ir Fluoro Guide Port Insertion Right  Result Date: 06/02/2016 INDICATION: History of malignant neoplasm of the supraglottis. In need of durable intravenous access for chemotherapy administration. EXAM: IMPLANTED PORT A CATH PLACEMENT WITH ULTRASOUND AND FLUOROSCOPIC GUIDANCE COMPARISON:  PET-CT - 05/15/2016 MEDICATIONS: Ancef 2 gm IV; The antibiotic was administered within an appropriate  time interval prior to skin puncture. ANESTHESIA/SEDATION: Moderate (conscious) sedation was employed during this procedure. A total of Versed 4 mg and Fentanyl 50 mcg was administered intravenously. Moderate Sedation Time: 23 minutes. The patient's level of consciousness and vital signs were monitored continuously by radiology nursing throughout the procedure under my direct supervision. CONTRAST:  None FLUOROSCOPY TIME:  18 seconds (3 mGy) COMPLICATIONS: None immediate. PROCEDURE: The procedure, risks, benefits, and alternatives were explained to the patient. Questions regarding the procedure were encouraged and answered. The patient understands and consents to the procedure. The right neck and chest were prepped with chlorhexidine in a sterile  fashion, and a sterile drape was applied covering the operative field. Maximum barrier sterile technique with sterile gowns and gloves were used for the procedure. A timeout was performed prior to the initiation of the procedure. Local anesthesia was provided with 1% lidocaine with epinephrine. After creating a small venotomy incision, a micropuncture kit was utilized to access the internal jugular vein under direct, real-time ultrasound guidance. Ultrasound image documentation was performed. The microwire was kinked to measure appropriate catheter length. A subcutaneous port pocket was then created along the upper chest wall utilizing a combination of sharp and blunt dissection. The pocket was irrigated with sterile saline. A single lumen thin power injectable port was chosen for placement. The 8 Fr catheter was tunneled from the port pocket site to the venotomy incision. The port was placed in the pocket. The external catheter was trimmed to appropriate length. At the venotomy, an 8 Fr peel-away sheath was placed over a guidewire under fluoroscopic guidance. The catheter was then placed through the sheath and the sheath was removed. Final catheter positioning was confirmed and documented with a fluoroscopic spot radiograph. The port was accessed with a Huber needle, aspirated and flushed with heparinized saline. The venotomy site was closed with an interrupted 4-0 Vicryl suture. The port pocket incision was closed with interrupted 2-0 Vicryl suture and the skin was opposed with a running subcuticular 4-0 Vicryl suture. Dermabond and Steri-strips were applied to both incisions. Dressings were placed. The patient tolerated the procedure well without immediate post procedural complication. FINDINGS: After catheter placement, the tip lies within the superior cavoatrial junction. The catheter aspirates and flushes normally and is ready for immediate use. IMPRESSION: Successful placement of a right internal jugular  approach power injectable Port-A-Cath. The catheter is ready for immediate use. Electronically Signed   By: Sandi Mariscal M.D.   On: 06/02/2016 15:50    ASSESSMENT & PLAN:  Malignant neoplasm of supraglottis ([place]) He appears to be tolerating treatment very well.  He has gained some weight.  His pain is less.  He denies further hemoptysis.  He has quit smoking. I will continue to monitor him carefully.  He will be due for cycle 2 of chemotherapy next week.  Status post tracheostomy [facility]) The tracheostomy site looks good without signs of bleeding or infection  Cancer associated pain This is less, likely due to positive response to chemotherapy.  He will continue to take pain medicine as needed  Constipation This has resolved with aggressive laxative therapy.  I encouraged him to continue to take laxatives as tolerated   No orders of the defined types were placed in this encounter.  All questions were answered. The patient knows to call the clinic with any problems, questions or concerns. No barriers to learning was detected. I spent 15 minutes counseling the patient face to face. The total time spent in the appointment  was 20 minutes and more than 50% was on counseling and review of test results     Heath Lark, MD 06/22/2016 8:58 AM

## 2016-06-22 NOTE — Assessment & Plan Note (Signed)
This is less, likely due to positive response to chemotherapy.  He will continue to take pain medicine as needed

## 2016-06-22 NOTE — Assessment & Plan Note (Signed)
This has resolved with aggressive laxative therapy.  I encouraged him to continue to take laxatives as tolerated

## 2016-06-22 NOTE — Assessment & Plan Note (Signed)
He appears to be tolerating treatment very well.  He has gained some weight.  His pain is less.  He denies further hemoptysis.  He has quit smoking. I will continue to monitor him carefully.  He will be due for cycle 2 of chemotherapy next week.

## 2016-06-22 NOTE — Assessment & Plan Note (Signed)
The tracheostomy site looks good without signs of bleeding or infection

## 2016-06-27 ENCOUNTER — Ambulatory Visit (HOSPITAL_BASED_OUTPATIENT_CLINIC_OR_DEPARTMENT_OTHER): Payer: No Typology Code available for payment source | Admitting: Hematology and Oncology

## 2016-06-27 ENCOUNTER — Other Ambulatory Visit (HOSPITAL_BASED_OUTPATIENT_CLINIC_OR_DEPARTMENT_OTHER): Payer: No Typology Code available for payment source

## 2016-06-27 ENCOUNTER — Encounter: Payer: Self-pay | Admitting: Hematology and Oncology

## 2016-06-27 ENCOUNTER — Telehealth: Payer: Self-pay | Admitting: Oncology

## 2016-06-27 DIAGNOSIS — T451X5A Adverse effect of antineoplastic and immunosuppressive drugs, initial encounter: Secondary | ICD-10-CM

## 2016-06-27 DIAGNOSIS — Z930 Tracheostomy status: Secondary | ICD-10-CM

## 2016-06-27 DIAGNOSIS — C321 Malignant neoplasm of supraglottis: Secondary | ICD-10-CM

## 2016-06-27 DIAGNOSIS — D6481 Anemia due to antineoplastic chemotherapy: Secondary | ICD-10-CM

## 2016-06-27 LAB — CBC WITH DIFFERENTIAL/PLATELET
BASO%: 1.6 % (ref 0.0–2.0)
BASOS ABS: 0.1 10*3/uL (ref 0.0–0.1)
EOS%: 0.6 % (ref 0.0–7.0)
Eosinophils Absolute: 0 10*3/uL (ref 0.0–0.5)
HCT: 31.8 % — ABNORMAL LOW (ref 38.4–49.9)
HGB: 10.4 g/dL — ABNORMAL LOW (ref 13.0–17.1)
LYMPH%: 18.7 % (ref 14.0–49.0)
MCH: 29.9 pg (ref 27.2–33.4)
MCHC: 32.9 g/dL (ref 32.0–36.0)
MCV: 90.9 fL (ref 79.3–98.0)
MONO#: 1 10*3/uL — AB (ref 0.1–0.9)
MONO%: 11.9 % (ref 0.0–14.0)
NEUT%: 67.2 % (ref 39.0–75.0)
NEUTROS ABS: 5.7 10*3/uL (ref 1.5–6.5)
PLATELETS: 441 10*3/uL — AB (ref 140–400)
RBC: 3.49 10*6/uL — AB (ref 4.20–5.82)
RDW: 14.5 % (ref 11.0–14.6)
WBC: 8.5 10*3/uL (ref 4.0–10.3)
lymph#: 1.6 10*3/uL (ref 0.9–3.3)

## 2016-06-27 LAB — MAGNESIUM: Magnesium: 2.1 mg/dl (ref 1.5–2.5)

## 2016-06-27 LAB — COMPREHENSIVE METABOLIC PANEL
ALT: 16 U/L (ref 0–55)
ANION GAP: 9 meq/L (ref 3–11)
AST: 19 U/L (ref 5–34)
Albumin: 3.1 g/dL — ABNORMAL LOW (ref 3.5–5.0)
Alkaline Phosphatase: 110 U/L (ref 40–150)
BUN: 5.5 mg/dL — ABNORMAL LOW (ref 7.0–26.0)
CHLORIDE: 104 meq/L (ref 98–109)
CO2: 27 meq/L (ref 22–29)
CREATININE: 0.8 mg/dL (ref 0.7–1.3)
Calcium: 9.3 mg/dL (ref 8.4–10.4)
EGFR: >90 mL/min/{1.73_m2} (ref >90)
GLUCOSE: 118 mg/dL (ref 70–140)
Potassium: 4.5 mEq/L (ref 3.5–5.1)
SODIUM: 141 meq/L (ref 136–145)
TOTAL PROTEIN: 6.7 g/dL (ref 6.4–8.3)

## 2016-06-27 NOTE — Assessment & Plan Note (Signed)
This is likely due to recent treatment. The patient denies recent history of bleeding such as epistaxis, hematuria or hematochezia. He is asymptomatic from the anemia. I will observe for now.  He does not require transfusion now. I will continue the chemotherapy at current dose without dosage adjustment.  If the anemia gets progressive worse in the future, I might have to delay his treatment or adjust the chemotherapy dose.  

## 2016-06-27 NOTE — Telephone Encounter (Signed)
MD Gorsuch  Patient. Scheduled appt per 06/27/2016 los. Gave patient Avs and calender per 06/27/2016 los

## 2016-06-27 NOTE — Assessment & Plan Note (Signed)
The tracheostomy site looks good without signs of bleeding or infection

## 2016-06-27 NOTE — Progress Notes (Signed)
[place] [place] OFFICE PROGRESS NOTE  Patient Care Team: Velna Ochs, MD as PCP - General Leota Sauers, RN as Oncology Nurse Navigator Eppie Gibson, MD as Attending Physician (Radiation Oncology) Karie Mainland, RD as Dietitian (Nutrition)  SUMMARY OF ONCOLOGIC HISTORY:   Malignant neoplasm of supraglottis ([place])   04/17/2016 Imaging    Ct neck showed relatively large approximately 3.5 cm laryngeal mass most compatible with laryngeal carcinoma. Epicenter at the glottis on the right. Extension across midline as well as involvement of laryngeal cartilages (possibly also bilateral). 2. Relatively small but malignant appearing right level 2 lymphadenopathy. Suspicious contralateral left level 2 lymph nodes.      04/21/2016 Initial Diagnosis    He saw Dr. Constance Holster in the clinic for chronic hoarseness. In the office, it was noted that the posterior soft palate, uvula, tongue base and vallecula were visualized and appeared healthy without mucosal masses or lesions. There is a large mass involving the right side of the larynx, the right cord is fixed. There may be involvement of the post cricoid area it is difficult to say for sure. 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 Clinically this is at least staged as T3. The airway is significantly impaired. He is at risk for impending airway obstruction. ENT recommended him for awake tracheostomy with direct laryngoscopy and biopsy and esophagoscopy under anesthesia immediately following that.       04/24/2016 Pathology Results    Larynx, biopsy, Right Side - POSITIVE FOR INVASIVE SQUAMOUS CELL CARCINOMA. - SEE COMMENT. Microscopic Comment As sampled, the squamous cell carcinoma appears well differentiated. A fragment of benign cartilage is also present in the specimen.      04/24/2016 Surgery    She underwent direct laryngoscopy with biopsy. An anterior commissure scope was used to evaluate the  larynx. A large mass and, using the right side of the larynx and the anterior commissure area was identified. Multiple biopsies were taken. The left cord was swollen but seemed to be uninvolved. Piriforms appeared to be clear. The post cricoid area appeared clear. Esophagoscopy was difficult and ENT surgeon was unable to pass the scope into the esophagus. He had tracheostomy      04/24/2016 Pathology Results    Larynx, biopsy, Right Side - POSITIVE FOR INVASIVE SQUAMOUS CELL CARCINOMA. - SEE COMMENT. Microscopic Comment As sampled, the squamous cell carcinoma appears well differentiated. A fragment of benign cartilage is also present in the specimen.       05/15/2016 PET scan    Right eccentric glottic mass, appears to cross the anterior commissure with destructive findings of the thyroid cartilage. There is an upper normal size right station 2A lymph node with maximum SUV of 4.2 which is only very faintly elevated over background, but which could possibly represent a small amount of local metastatic spread. 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 2. Asymmetric calcification along the head of the right caudate nucleus and adjacent lentiform nucleus. Well this can sometimes be physiologic, there is some accompanying reduction in PET activity, and the asymmetry is an unusual feature. Accordingly I recommend MRI of the brain with and without contrast for further characterization. 3. A AP window lymph node is minimally enlarged at 1.1 cm, but with a maximum SUV below background mediastinal levels, likely benign.      05/23/2016 Imaging    MRI brain:  Atrophy with chronic microvascular ischemic change. No intracranial metastatic disease. 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        05/26/2016 Surgery    Multiple extraction of tooth numbers 2, 5, 6, 8-13, and  21-28. 2. 4 Quadrants of alveoloplasty. 3. Bilateral mandibular lingual tori reductions 4. Maxillary right, maxillary left, and mandibular left buccal exostoses reductions      06/02/2016 Procedure    Successful placement of a right internal jugular approach power injectable Port-A-Cath. The catheter is ready for immediate use      06/09/2016 -  Chemotherapy    He received induction chemotherapy with taxotere, cisplatin and 5 FU       INTERVAL HISTORY: Please see below for problem oriented charting. He returns for further follow-up with his girlfriend. He is doing well. He is eating nonstop and is gaining weight. He denies recent hemoptysis. He denies recent smoking. No recent nausea, vomiting or diarrhea. Denies hearing loss of peripheral neuropathy from chemotherapy He denies a need to take regular pain medicine.  REVIEW OF SYSTEMS:   Constitutional: Denies fevers, chills or abnormal weight loss Eyes: Denies blurriness of vision Ears, nose, mouth, throat, and face: Denies mucositis or sore throat Respiratory: Denies cough, dyspnea or wheezes Cardiovascular: Denies palpitation, chest discomfort or lower extremity swelling Gastrointestinal:  Denies nausea, heartburn or change in bowel habits Skin: Denies abnormal skin rashes Lymphatics: Denies new lymphadenopathy or easy bruising Neurological:Denies numbness, tingling or new weaknesses Behavioral/Psych: Mood is stable, no new changes  All other systems were reviewed with the patient and are negative.  I have reviewed the past medical history, past surgical history, social history and family history with the patient and they are unchanged from previous note.  ALLERGIES:  is allergic to no known allergies.  MEDICATIONS:  Current Outpatient Prescriptions  Medication Sig Dispense Refill  . dexamethasone (DECADRON) 4 MG tablet Take 2 tablets (8 mg  total) by mouth 2 (two) times daily. Start the day before Taxotere. Take once the day after, then 2 times a day x 2d. 30 tablet 1  . lidocaine-prilocaine (EMLA) cream Apply to affected area once 30 g 3  . Morphine Sulfate (MORPHINE CONCENTRATE) 10 mg / 0.5 ml concentrated solution Take 0.5 mLs (10 mg total) by mouth every 2 (two) hours as needed for severe pain. 240 mL 0  . nicotine (NICODERM CQ - DOSED IN MG/24 HR) 7 mg/24hr patch Place 1 patch (7 mg total) onto the skin daily. 14 patch 0  . sennosides-docusate sodium (SENOKOT-S) 8.6-50 MG tablet Take 2 tablets by mouth daily. 60 tablet 0  . LORazepam (ATIVAN) 0.5 MG tablet Take 1-2 tablets 30 min before MRI and before mask fabrication to help with claustrophobia. (Patient not taking: Reported on 06/13/2016) 4 tablet 0  . ondansetron (ZOFRAN) 8 MG tablet Take 1 tablet (8 mg total) by mouth 2 (two) times daily as needed (Nausea or vomiting). Begin 4 days after chemotherapy. (Patient not taking: Reported on 06/27/2016) 30 tablet 1  . prochlorperazine (COMPAZINE) 10 MG tablet Take 1 tablet (10 mg total) by mouth every 6 (six) hours as needed (Nausea or vomiting). (Patient not taking: Reported on 06/27/2016) 30 tablet 1  . promethazine (PHENERGAN) 25 MG suppository Place 1 suppository (25 mg total) rectally every 6 (six) hours as needed for nausea. (Patient not taking: Reported on 05/15/2016) 12 each 0   No current  facility-administered medications for this visit.     PHYSICAL EXAMINATION: ECOG PERFORMANCE STATUS: 0 - Asymptomatic  Vitals:   06/27/16 1509  BP: 112/77  Pulse: (!) 102  Resp: 18  Temp: 99.8 F (37.7 C)   Filed Weights   06/27/16 1509  Weight: 132 lb (59.9 kg)    GENERAL:alert, no distress and comfortable SKIN: skin color, texture, turgor are normal, no rashes or significant lesions EYES: normal, Conjunctiva are pink and non-injected, sclera clear OROPHARYNX:no exudate, no erythema and lips, buccal mucosa, and tongue normal   NECK: supple, thyroid normal size, non-tender, without nodularity LYMPH:  no palpable lymphadenopathy in the cervical, axillary or inguinal LUNGS: clear to auscultation and percussion with normal breathing effort HEART: regular rate & rhythm and no murmurs and no lower extremity edema ABDOMEN:abdomen soft, non-tender and normal bowel sounds Musculoskeletal:no cyanosis of digits and no clubbing  NEURO: alert & oriented x 3 with fluent speech, no focal motor/sensory deficits  LABORATORY DATA:  I have reviewed the data as listed    Component Value Date/Time   NA 141 06/27/2016 1440   K 4.5 06/27/2016 1440   CL 101 06/02/2016 1237   CO2 27 06/27/2016 1440   GLUCOSE 118 06/27/2016 1440   BUN 5.5 (L) 06/27/2016 1440   CREATININE 0.8 06/27/2016 1440   CALCIUM 9.3 06/27/2016 1440   PROT 6.7 06/27/2016 1440   ALBUMIN 3.1 (L) 06/27/2016 1440   AST 19 06/27/2016 1440   ALT 16 06/27/2016 1440   ALKPHOS 110 06/27/2016 1440   BILITOT <0.22 06/27/2016 1440   GFRNONAA >60 06/02/2016 1237   GFRNONAA 82 03/06/2011 0853   GFRAA >60 06/02/2016 1237   GFRAA >89 03/06/2011 0853    No results found for: SPEP, UPEP  Lab Results  Component Value Date   WBC 8.5 06/27/2016   NEUTROABS 5.7 06/27/2016   HGB 10.4 (L) 06/27/2016   HCT 31.8 (L) 06/27/2016   MCV 90.9 06/27/2016   PLT 441 (H) 06/27/2016      Chemistry      Component Value Date/Time   NA 141 06/27/2016 1440   K 4.5 06/27/2016 1440   CL 101 06/02/2016 1237   CO2 27 06/27/2016 1440   BUN 5.5 (L) 06/27/2016 1440   CREATININE 0.8 06/27/2016 1440      Component Value Date/Time   CALCIUM 9.3 06/27/2016 1440   ALKPHOS 110 06/27/2016 1440   AST 19 06/27/2016 1440   ALT 16 06/27/2016 1440   BILITOT <0.22 06/27/2016 1440       RADIOGRAPHIC STUDIES: I have personally reviewed the radiological images as listed and agreed with the findings in the report. Ir US Guide Vasc Access Right  Result Date: 06/02/2016 INDICATION: History  of malignant neoplasm of the supraglottis. In need of durable intravenous access for chemotherapy administration. EXAM: IMPLANTED PORT A CATH PLACEMENT WITH ULTRASOUND AND FLUOROSCOPIC GUIDANCE COMPARISON:  PET-CT - 05/15/2016 MEDICATIONS: Ancef 2 gm IV; The antibiotic was administered within an appropriate time interval prior to skin puncture. ANESTHESIA/SEDATION: Moderate (conscious) sedation was employed during this procedure. A total of Versed 4 mg and Fentanyl 50 mcg was administered intravenously. Moderate Sedation Time: 23 minutes. The patient's level of consciousness and vital signs were monitored continuously by radiology nursing throughout the procedure under my direct supervision. CONTRAST:  None FLUOROSCOPY TIME:  18 seconds (3 mGy) COMPLICATIONS: None immediate. PROCEDURE: The procedure, risks, benefits, and alternatives were explained to the patient. Questions regarding the procedure were encouraged and answered. The patient  understands and consents to the procedure. The right neck and chest were prepped with chlorhexidine in a sterile fashion, and a sterile drape was applied covering the operative field. Maximum barrier sterile technique with sterile gowns and gloves were used for the procedure. A timeout was performed prior to the initiation of the procedure. Local anesthesia was provided with 1% lidocaine with epinephrine. After creating a small venotomy incision, a micropuncture kit was utilized to access the internal jugular vein under direct, real-time ultrasound guidance. Ultrasound image documentation was performed. The microwire was kinked to measure appropriate catheter length. A subcutaneous port pocket was then created along the upper chest wall utilizing a combination of sharp and blunt dissection. The pocket was irrigated with sterile saline. A single lumen thin power injectable port was chosen for placement. The 8 Fr catheter was tunneled from the port pocket site to the venotomy  incision. The port was placed in the pocket. The external catheter was trimmed to appropriate length. At the venotomy, an 8 Fr peel-away sheath was placed over a guidewire under fluoroscopic guidance. The catheter was then placed through the sheath and the sheath was removed. Final catheter positioning was confirmed and documented with a fluoroscopic spot radiograph. The port was accessed with a Huber needle, aspirated and flushed with heparinized saline. The venotomy site was closed with an interrupted 4-0 Vicryl suture. The port pocket incision was closed with interrupted 2-0 Vicryl suture and the skin was opposed with a running subcuticular 4-0 Vicryl suture. Dermabond and Steri-strips were applied to both incisions. Dressings were placed. The patient tolerated the procedure well without immediate post procedural complication. FINDINGS: After catheter placement, the tip lies within the superior cavoatrial junction. The catheter aspirates and flushes normally and is ready for immediate use. IMPRESSION: Successful placement of a right internal jugular approach power injectable Port-A-Cath. The catheter is ready for immediate use. Electronically Signed   By: Sandi Mariscal M.D.   On: 06/02/2016 15:50   Ir Fluoro Guide Port Insertion Right  Result Date: 06/02/2016 INDICATION: History of malignant neoplasm of the supraglottis. In need of durable intravenous access for chemotherapy administration. EXAM: IMPLANTED PORT A CATH PLACEMENT WITH ULTRASOUND AND FLUOROSCOPIC GUIDANCE COMPARISON:  PET-CT - 05/15/2016 MEDICATIONS: Ancef 2 gm IV; The antibiotic was administered within an appropriate time interval prior to skin puncture. ANESTHESIA/SEDATION: Moderate (conscious) sedation was employed during this procedure. A total of Versed 4 mg and Fentanyl 50 mcg was administered intravenously. Moderate Sedation Time: 23 minutes. The patient's level of consciousness and vital signs were monitored continuously by radiology  nursing throughout the procedure under my direct supervision. CONTRAST:  None FLUOROSCOPY TIME:  18 seconds (3 mGy) COMPLICATIONS: None immediate. PROCEDURE: The procedure, risks, benefits, and alternatives were explained to the patient. Questions regarding the procedure were encouraged and answered. The patient understands and consents to the procedure. The right neck and chest were prepped with chlorhexidine in a sterile fashion, and a sterile drape was applied covering the operative field. Maximum barrier sterile technique with sterile gowns and gloves were used for the procedure. A timeout was performed prior to the initiation of the procedure. Local anesthesia was provided with 1% lidocaine with epinephrine. After creating a small venotomy incision, a micropuncture kit was utilized to access the internal jugular vein under direct, real-time ultrasound guidance. Ultrasound image documentation was performed. The microwire was kinked to measure appropriate catheter length. A subcutaneous port pocket was then created along the upper chest wall utilizing a combination of sharp and  blunt dissection. The pocket was irrigated with sterile saline. A single lumen thin power injectable port was chosen for placement. The 8 Fr catheter was tunneled from the port pocket site to the venotomy incision. The port was placed in the pocket. The external catheter was trimmed to appropriate length. At the venotomy, an 8 Fr peel-away sheath was placed over a guidewire under fluoroscopic guidance. The catheter was then placed through the sheath and the sheath was removed. Final catheter positioning was confirmed and documented with a fluoroscopic spot radiograph. The port was accessed with a Huber needle, aspirated and flushed with heparinized saline. The venotomy site was closed with an interrupted 4-0 Vicryl suture. The port pocket incision was closed with interrupted 2-0 Vicryl suture and the skin was opposed with a running  subcuticular 4-0 Vicryl suture. Dermabond and Steri-strips were applied to both incisions. Dressings were placed. The patient tolerated the procedure well without immediate post procedural complication. FINDINGS: After catheter placement, the tip lies within the superior cavoatrial junction. The catheter aspirates and flushes normally and is ready for immediate use. IMPRESSION: Successful placement of a right internal jugular approach power injectable Port-A-Cath. The catheter is ready for immediate use. Electronically Signed   By: Sandi Mariscal M.D.   On: 06/02/2016 15:50    ASSESSMENT & PLAN:  Malignant neoplasm of supraglottis ([place]) The patient has remarkable positive response to treatment. He is gaining weight and denies further dysphagia We will proceed with cycle 2 of chemotherapy at the end of the week without dose adjustment. I plan to give him 3 cycles of chemotherapy before repeat PET CT scan.  Status post tracheostomy [facility]) The tracheostomy site looks good without signs of bleeding or infection  Anemia due to antineoplastic chemotherapy This is likely due to recent treatment. The patient denies recent history of bleeding such as epistaxis, hematuria or hematochezia. He is asymptomatic from the anemia. I will observe for now.  He does not require transfusion now. I will continue the chemotherapy at current dose without dosage adjustment.  If the anemia gets progressive worse in the future, I might have to delay his treatment or adjust the chemotherapy dose.    No orders of the defined types were placed in this encounter.  All questions were answered. The patient knows to call the clinic with any problems, questions or concerns. No barriers to learning was detected. I spent 15 minutes counseling the patient face to face. The total time spent in the appointment was 20 minutes and more than 50% was on counseling and review of test results     Heath Lark, MD 06/27/2016 4:37 PM

## 2016-06-27 NOTE — Assessment & Plan Note (Signed)
The patient has remarkable positive response to treatment. He is gaining weight and denies further dysphagia We will proceed with cycle 2 of chemotherapy at the end of the week without dose adjustment. I plan to give him 3 cycles of chemotherapy before repeat PET CT scan.

## 2016-06-30 ENCOUNTER — Ambulatory Visit (HOSPITAL_BASED_OUTPATIENT_CLINIC_OR_DEPARTMENT_OTHER): Payer: No Typology Code available for payment source

## 2016-06-30 ENCOUNTER — Ambulatory Visit: Payer: No Typology Code available for payment source | Admitting: Nutrition

## 2016-06-30 VITALS — BP 105/71 | HR 92 | Temp 98.1°F | Resp 18

## 2016-06-30 DIAGNOSIS — Z5111 Encounter for antineoplastic chemotherapy: Secondary | ICD-10-CM

## 2016-06-30 DIAGNOSIS — C321 Malignant neoplasm of supraglottis: Secondary | ICD-10-CM

## 2016-06-30 MED ORDER — PALONOSETRON HCL INJECTION 0.25 MG/5ML
INTRAVENOUS | Status: AC
  Filled 2016-06-30: qty 5

## 2016-06-30 MED ORDER — SODIUM CHLORIDE 0.9 % IV SOLN
750.0000 mg/m2/d | INTRAVENOUS | Status: DC
  Administered 2016-06-30: 4900 mg via INTRAVENOUS
  Filled 2016-06-30: qty 98

## 2016-06-30 MED ORDER — SODIUM CHLORIDE 0.9 % IV SOLN
Freq: Once | INTRAVENOUS | Status: AC
  Administered 2016-06-30: 09:00:00 via INTRAVENOUS

## 2016-06-30 MED ORDER — PALONOSETRON HCL INJECTION 0.25 MG/5ML
0.2500 mg | Freq: Once | INTRAVENOUS | Status: AC
  Administered 2016-06-30: 0.25 mg via INTRAVENOUS

## 2016-06-30 MED ORDER — SODIUM CHLORIDE 0.9 % IV SOLN
75.0000 mg/m2 | Freq: Once | INTRAVENOUS | Status: AC
  Administered 2016-06-30: 123 mg via INTRAVENOUS
  Filled 2016-06-30: qty 123

## 2016-06-30 MED ORDER — HEPARIN SOD (PORK) LOCK FLUSH 100 UNIT/ML IV SOLN
500.0000 [IU] | Freq: Once | INTRAVENOUS | Status: DC | PRN
  Filled 2016-06-30: qty 5

## 2016-06-30 MED ORDER — SODIUM CHLORIDE 0.9% FLUSH
10.0000 mL | INTRAVENOUS | Status: DC | PRN
  Filled 2016-06-30: qty 10

## 2016-06-30 MED ORDER — DEXTROSE 5 % IV SOLN
75.0000 mg/m2 | Freq: Once | INTRAVENOUS | Status: AC
  Administered 2016-06-30: 120 mg via INTRAVENOUS
  Filled 2016-06-30: qty 12

## 2016-06-30 MED ORDER — SODIUM CHLORIDE 0.9 % IV SOLN
Freq: Once | INTRAVENOUS | Status: AC
  Administered 2016-06-30: 12:00:00 via INTRAVENOUS
  Filled 2016-06-30: qty 5

## 2016-06-30 MED ORDER — POTASSIUM CHLORIDE 2 MEQ/ML IV SOLN
Freq: Once | INTRAVENOUS | Status: AC
  Administered 2016-06-30: 09:00:00 via INTRAVENOUS
  Filled 2016-06-30: qty 10

## 2016-06-30 NOTE — Patient Instructions (Signed)
[facility] Discharge Instructions for Patients Receiving Chemotherapy  Today you received the following chemotherapy agents Taxotere, Cisplatin and Adrucil   To help prevent nausea and vomiting after your treatment, we encourage you to take your nausea medication as directed. No Zofran for 3 days. Take Compazine instead.    If you develop nausea and vomiting that is not controlled by your nausea medication, call the clinic.   BELOW ARE SYMPTOMS THAT SHOULD BE REPORTED IMMEDIATELY:  *FEVER GREATER THAN 100.5 F  *CHILLS WITH OR WITHOUT FEVER  NAUSEA AND VOMITING THAT IS NOT CONTROLLED WITH YOUR NAUSEA MEDICATION  *UNUSUAL SHORTNESS OF BREATH  *UNUSUAL BRUISING OR BLEEDING  TENDERNESS IN MOUTH AND THROAT WITH OR WITHOUT PRESENCE OF ULCERS  *URINARY PROBLEMS  *BOWEL PROBLEMS  UNUSUAL RASH Items with * indicate a potential emergency and should be followed up as soon as possible.  Feel free to call the clinic you have any questions or concerns. The clinic phone number is [phone].  Please show the [facility] at check-in to the Emergency Department and triage nurse.  Docetaxel injection What is this medicine? DOCETAXEL (doe se TAX el) is a chemotherapy drug. It targets fast dividing cells, like cancer cells, and causes these cells to die. This medicine is used to treat many types of cancers like breast cancer, certain stomach cancers, head and neck cancer, lung cancer, and prostate cancer. This medicine may be used for other purposes; ask your health care provider or pharmacist if you have questions. COMMON BRAND NAME(S): Docefrez, Taxotere What should I tell my health care provider before I take this medicine? They need to know if you have any of these conditions: -infection (especially a virus infection such as chickenpox, cold sores, or herpes) -liver disease -low blood counts, like low white cell, platelet, or red cell counts -an unusual or allergic  reaction to docetaxel, polysorbate 80, other chemotherapy agents, other medicines, foods, dyes, or preservatives -pregnant or trying to get pregnant -breast-feeding How should I use this medicine? This drug is given as an infusion into a vein. It is administered in a hospital or clinic by a specially trained health care professional. Talk to your pediatrician regarding the use of this medicine in children. Special care may be needed. Overdosage: If you think you have taken too much of this medicine contact a poison control center or emergency room at once. NOTE: This medicine is only for you. Do not share this medicine with others. What if I miss a dose? It is important not to miss your dose. Call your doctor or health care professional if you are unable to keep an appointment. What may interact with this medicine? -cyclosporine -erythromycin -ketoconazole -medicines to increase blood counts like filgrastim, pegfilgrastim, sargramostim -vaccines Talk to your doctor or health care professional before taking any of these medicines: -acetaminophen -aspirin -ibuprofen -ketoprofen -naproxen This list may not describe all possible interactions. Give your health care provider a list of all the medicines, herbs, non-prescription drugs, or dietary supplements you use. Also tell them if you smoke, drink alcohol, or use illegal drugs. Some items may interact with your medicine. What should I watch for while using this medicine? Your condition will be monitored carefully while you are receiving this medicine. You will need important blood work done while you are taking this medicine. This drug may make you feel generally unwell. This is not uncommon, as chemotherapy can affect healthy cells as well as cancer cells. Report any side  effects. Continue your course of treatment even though you feel ill unless your doctor tells you to stop. In some cases, you may be given additional medicines to help with  side effects. Follow all directions for their use. Call your doctor or health care professional for advice if you get a fever, chills or sore throat, or other symptoms of a cold or flu. Do not treat yourself. This drug decreases your body's ability to fight infections. Try to avoid being around people who are sick. This medicine may increase your risk to bruise or bleed. Call your doctor or health care professional if you notice any unusual bleeding. This medicine may contain alcohol in the product. You may get drowsy or dizzy. Do not drive, use machinery, or do anything that needs mental alertness until you know how this medicine affects you. Do not stand or sit up quickly, especially if you are an older patient. This reduces the risk of dizzy or fainting spells. Avoid alcoholic drinks. Do not become pregnant while taking this medicine. Women should inform their doctor if they wish to become pregnant or think they might be pregnant. There is a potential for serious side effects to an unborn child. Talk to your health care professional or pharmacist for more information. Do not breast-feed an infant while taking this medicine. What side effects may I notice from receiving this medicine? Side effects that you should report to your doctor or health care professional as soon as possible: -allergic reactions like skin rash, itching or hives, swelling of the face, lips, or tongue -low blood counts - This drug may decrease the number of white blood cells, red blood cells and platelets. You may be at increased risk for infections and bleeding. -signs of infection - fever or chills, cough, sore throat, pain or difficulty passing urine -signs of decreased platelets or bleeding - bruising, pinpoint red spots on the skin, black, tarry stools, nosebleeds -signs of decreased red blood cells - unusually weak or tired, fainting spells, lightheadedness -breathing problems -fast or irregular heartbeat -low blood  pressure -mouth sores -nausea and vomiting -pain, swelling, redness or irritation at the injection site -pain, tingling, numbness in the hands or feet -swelling of the ankle, feet, hands -weight gain Side effects that usually do not require medical attention (report to your doctor or health care professional if they continue or are bothersome): -bone pain -complete hair loss including hair on your head, underarms, pubic hair, eyebrows, and eyelashes -diarrhea -excessive tearing -changes in the color of fingernails -loosening of the fingernails -nausea -muscle pain -red flush to skin -sweating -weak or tired This list may not describe all possible side effects. Call your doctor for medical advice about side effects. You may report side effects to FDA at 1-800-FDA-1088. Where should I keep my medicine? This drug is given in a hospital or clinic and will not be stored at home. NOTE: This sheet is a summary. It may not cover all possible information. If you have questions about this medicine, talk to your doctor, pharmacist, or health care provider.  2018 [employer]/Gold Standard (2015-04-22 12:32:56)  Cisplatin injection What is this medicine? CISPLATIN (SIS pla tin) is a chemotherapy drug. It targets fast dividing cells, like cancer cells, and causes these cells to die. This medicine is used to treat many types of cancer like bladder, ovarian, and testicular cancers. This medicine may be used for other purposes; ask your health care provider or pharmacist if you have questions. COMMON BRAND NAME(S): Platinol,  Platinol -AQ What should I tell my health care provider before I take this medicine? They need to know if you have any of these conditions: -blood disorders -hearing problems -kidney disease -recent or ongoing radiation therapy -an unusual or allergic reaction to cisplatin, carboplatin, other chemotherapy, other medicines, foods, dyes, or preservatives -pregnant or trying to  get pregnant -breast-feeding How should I use this medicine? This drug is given as an infusion into a vein. It is administered in a hospital or clinic by a specially trained health care professional. Talk to your pediatrician regarding the use of this medicine in children. Special care may be needed. Overdosage: If you think you have taken too much of this medicine contact a poison control center or emergency room at once. NOTE: This medicine is only for you. Do not share this medicine with others. What if I miss a dose? It is important not to miss a dose. Call your doctor or health care professional if you are unable to keep an appointment. What may interact with this medicine? -dofetilide -foscarnet -medicines for seizures -medicines to increase blood counts like filgrastim, pegfilgrastim, sargramostim -probenecid -pyridoxine used with altretamine -rituximab -some antibiotics like amikacin, gentamicin, neomycin, polymyxin B, streptomycin, tobramycin -sulfinpyrazone -vaccines -zalcitabine Talk to your doctor or health care professional before taking any of these medicines: -acetaminophen -aspirin -ibuprofen -ketoprofen -naproxen This list may not describe all possible interactions. Give your health care provider a list of all the medicines, herbs, non-prescription drugs, or dietary supplements you use. Also tell them if you smoke, drink alcohol, or use illegal drugs. Some items may interact with your medicine. What should I watch for while using this medicine? Your condition will be monitored carefully while you are receiving this medicine. You will need important blood work done while you are taking this medicine. This drug may make you feel generally unwell. This is not uncommon, as chemotherapy can affect healthy cells as well as cancer cells. Report any side effects. Continue your course of treatment even though you feel ill unless your doctor tells you to stop. In some cases, you  may be given additional medicines to help with side effects. Follow all directions for their use. Call your doctor or health care professional for advice if you get a fever, chills or sore throat, or other symptoms of a cold or flu. Do not treat yourself. This drug decreases your body's ability to fight infections. Try to avoid being around people who are sick. This medicine may increase your risk to bruise or bleed. Call your doctor or health care professional if you notice any unusual bleeding. Be careful brushing and flossing your teeth or using a toothpick because you may get an infection or bleed more easily. If you have any dental work done, tell your dentist you are receiving this medicine. Avoid taking products that contain aspirin, acetaminophen, ibuprofen, naproxen, or ketoprofen unless instructed by your doctor. These medicines may hide a fever. Do not become pregnant while taking this medicine. Women should inform their doctor if they wish to become pregnant or think they might be pregnant. There is a potential for serious side effects to an unborn child. Talk to your health care professional or pharmacist for more information. Do not breast-feed an infant while taking this medicine. Drink fluids as directed while you are taking this medicine. This will help protect your kidneys. Call your doctor or health care professional if you get diarrhea. Do not treat yourself. What side effects may I notice  from receiving this medicine? Side effects that you should report to your doctor or health care professional as soon as possible: -allergic reactions like skin rash, itching or hives, swelling of the face, lips, or tongue -signs of infection - fever or chills, cough, sore throat, pain or difficulty passing urine -signs of decreased platelets or bleeding - bruising, pinpoint red spots on the skin, black, tarry stools, nosebleeds -signs of decreased red blood cells - unusually weak or tired, fainting  spells, lightheadedness -breathing problems -changes in hearing -gout pain -low blood counts - This drug may decrease the number of white blood cells, red blood cells and platelets. You may be at increased risk for infections and bleeding. -nausea and vomiting -pain, swelling, redness or irritation at the injection site -pain, tingling, numbness in the hands or feet -problems with balance, movement -trouble passing urine or change in the amount of urine Side effects that usually do not require medical attention (report to your doctor or health care professional if they continue or are bothersome): -changes in vision -loss of appetite -metallic taste in the mouth or changes in taste This list may not describe all possible side effects. Call your doctor for medical advice about side effects. You may report side effects to FDA at 1-800-FDA-1088. Where should I keep my medicine? This drug is given in a hospital or clinic and will not be stored at home. NOTE: This sheet is a summary. It may not cover all possible information. If you have questions about this medicine, talk to your doctor, pharmacist, or health care provider.  2018 [employer]/Gold Standard (2007-06-25 14:40:54)  Fluorouracil, 5-FU injection What is this medicine? FLUOROURACIL, 5-FU (flure oh YOOR a sil) is a chemotherapy drug. It slows the growth of cancer cells. This medicine is used to treat many types of cancer like breast cancer, colon or rectal cancer, pancreatic cancer, and stomach cancer. This medicine may be used for other purposes; ask your health care provider or pharmacist if you have questions. COMMON BRAND NAME(S): Adrucil What should I tell my health care provider before I take this medicine? They need to know if you have any of these conditions: -blood disorders -dihydropyrimidine dehydrogenase (DPD) deficiency -infection (especially a virus infection such as chickenpox, cold sores, or herpes) -kidney  disease -liver disease -malnourished, poor nutrition -recent or ongoing radiation therapy -an unusual or allergic reaction to fluorouracil, other chemotherapy, other medicines, foods, dyes, or preservatives -pregnant or trying to get pregnant -breast-feeding How should I use this medicine? This drug is given as an infusion or injection into a vein. It is administered in a hospital or clinic by a specially trained health care professional. Talk to your pediatrician regarding the use of this medicine in children. Special care may be needed. Overdosage: If you think you have taken too much of this medicine contact a poison control center or emergency room at once. NOTE: This medicine is only for you. Do not share this medicine with others. What if I miss a dose? It is important not to miss your dose. Call your doctor or health care professional if you are unable to keep an appointment. What may interact with this medicine? -allopurinol -cimetidine -dapsone -digoxin -hydroxyurea -leucovorin -levamisole -medicines for seizures like ethotoin, fosphenytoin, phenytoin -medicines to increase blood counts like filgrastim, pegfilgrastim, sargramostim -medicines that treat or prevent blood clots like warfarin, enoxaparin, and dalteparin -methotrexate -metronidazole -pyrimethamine -some other chemotherapy drugs like busulfan, cisplatin, estramustine, vinblastine -trimethoprim -trimetrexate -vaccines Talk to your doctor or  health care professional before taking any of these medicines: -acetaminophen -aspirin -ibuprofen -ketoprofen -naproxen This list may not describe all possible interactions. Give your health care provider a list of all the medicines, herbs, non-prescription drugs, or dietary supplements you use. Also tell them if you smoke, drink alcohol, or use illegal drugs. Some items may interact with your medicine. What should I watch for while using this medicine? Visit your doctor  for checks on your progress. This drug may make you feel generally unwell. This is not uncommon, as chemotherapy can affect healthy cells as well as cancer cells. Report any side effects. Continue your course of treatment even though you feel ill unless your doctor tells you to stop. In some cases, you may be given additional medicines to help with side effects. Follow all directions for their use. Call your doctor or health care professional for advice if you get a fever, chills or sore throat, or other symptoms of a cold or flu. Do not treat yourself. This drug decreases your body's ability to fight infections. Try to avoid being around people who are sick. This medicine may increase your risk to bruise or bleed. Call your doctor or health care professional if you notice any unusual bleeding. Be careful brushing and flossing your teeth or using a toothpick because you may get an infection or bleed more easily. If you have any dental work done, tell your dentist you are receiving this medicine. Avoid taking products that contain aspirin, acetaminophen, ibuprofen, naproxen, or ketoprofen unless instructed by your doctor. These medicines may hide a fever. Do not become pregnant while taking this medicine. Women should inform their doctor if they wish to become pregnant or think they might be pregnant. There is a potential for serious side effects to an unborn child. Talk to your health care professional or pharmacist for more information. Do not breast-feed an infant while taking this medicine. Men should inform their doctor if they wish to father a child. This medicine may lower sperm counts. Do not treat diarrhea with over the counter products. Contact your doctor if you have diarrhea that lasts more than 2 days or if it is severe and watery. This medicine can make you more sensitive to the sun. Keep out of the sun. If you cannot avoid being in the sun, wear protective clothing and use sunscreen. Do not  use sun lamps or tanning beds/booths. What side effects may I notice from receiving this medicine? Side effects that you should report to your doctor or health care professional as soon as possible: -allergic reactions like skin rash, itching or hives, swelling of the face, lips, or tongue -low blood counts - this medicine may decrease the number of white blood cells, red blood cells and platelets. You may be at increased risk for infections and bleeding. -signs of infection - fever or chills, cough, sore throat, pain or difficulty passing urine -signs of decreased platelets or bleeding - bruising, pinpoint red spots on the skin, black, tarry stools, blood in the urine -signs of decreased red blood cells - unusually weak or tired, fainting spells, lightheadedness -breathing problems -changes in vision -chest pain -mouth sores -nausea and vomiting -pain, swelling, redness at site where injected -pain, tingling, numbness in the hands or feet -redness, swelling, or sores on hands or feet -stomach pain -unusual bleeding Side effects that usually do not require medical attention (report to your doctor or health care professional if they continue or are bothersome): -changes in finger or  toe nails -diarrhea -dry or itchy skin -hair loss -headache -loss of appetite -sensitivity of eyes to the light -stomach upset -unusually teary eyes This list may not describe all possible side effects. Call your doctor for medical advice about side effects. You may report side effects to FDA at 1-800-FDA-1088. Where should I keep my medicine? This drug is given in a hospital or clinic and will not be stored at home. NOTE: This sheet is a summary. It may not cover all possible information. If you have questions about this medicine, talk to your doctor, pharmacist, or health care provider.  2018 [employer]/Gold Standard (2007-07-24 13:53:16)

## 2016-06-30 NOTE — Progress Notes (Signed)
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 Weight improved documented as 132 pounds March 27 increased from 128 pounds. Patient is tolerating treatment, but is sometimes forcing himself to eat. States he is determined to do well.  Nutrition diagnosis:  Inadequate oral intake has improved.  Intervention: Educated patient to continue increased calories and protein in small frequent meals and snacks. Encouraged him to continue oral nutrition supplements. Provided support and encouragement. Questions were answered.  Teach back method used.  Monitoring, evaluation, goals: Patient is tolerating increased calories and protein and has gained weight.  Next visit: Friday, April 20 , during infusion.  **Disclaimer: This note was dictated with voice recognition software. Similar sounding words can inadvertently be transcribed and this note may contain transcription errors which may not have been corrected upon publication of note.**

## 2016-07-02 DIAGNOSIS — Z930 Tracheostomy status: Secondary | ICD-10-CM | POA: Diagnosis not present

## 2016-07-04 ENCOUNTER — Ambulatory Visit (HOSPITAL_BASED_OUTPATIENT_CLINIC_OR_DEPARTMENT_OTHER): Payer: No Typology Code available for payment source | Admitting: Hematology and Oncology

## 2016-07-04 ENCOUNTER — Ambulatory Visit: Payer: No Typology Code available for payment source

## 2016-07-04 ENCOUNTER — Encounter: Payer: Self-pay | Admitting: Hematology and Oncology

## 2016-07-04 ENCOUNTER — Telehealth: Payer: Self-pay | Admitting: Hematology and Oncology

## 2016-07-04 ENCOUNTER — Ambulatory Visit (HOSPITAL_BASED_OUTPATIENT_CLINIC_OR_DEPARTMENT_OTHER): Payer: No Typology Code available for payment source

## 2016-07-04 ENCOUNTER — Telehealth: Payer: Self-pay | Admitting: Nurse Practitioner

## 2016-07-04 DIAGNOSIS — B370 Candidal stomatitis: Secondary | ICD-10-CM

## 2016-07-04 DIAGNOSIS — E860 Dehydration: Secondary | ICD-10-CM

## 2016-07-04 DIAGNOSIS — Z5189 Encounter for other specified aftercare: Secondary | ICD-10-CM

## 2016-07-04 DIAGNOSIS — C321 Malignant neoplasm of supraglottis: Secondary | ICD-10-CM

## 2016-07-04 DIAGNOSIS — Z930 Tracheostomy status: Secondary | ICD-10-CM

## 2016-07-04 MED ORDER — FLUCONAZOLE 100 MG PO TABS: 100.0000 mg | ORAL_TABLET | Freq: Every day | ORAL | 0 refills | Status: DC

## 2016-07-04 MED ORDER — SODIUM CHLORIDE 0.9 % IV SOLN
Freq: Once | INTRAVENOUS | Status: AC
  Administered 2016-07-04: 16:00:00 via INTRAVENOUS

## 2016-07-04 MED ORDER — PEGFILGRASTIM INJECTION 6 MG/0.6ML ~~LOC~~
6.0000 mg | PREFILLED_SYRINGE | Freq: Once | SUBCUTANEOUS | Status: AC
  Administered 2016-07-04: 6 mg via SUBCUTANEOUS
  Filled 2016-07-04: qty 0.6

## 2016-07-04 MED ORDER — SODIUM CHLORIDE 0.9% FLUSH
10.0000 mL | INTRAVENOUS | Status: DC | PRN
  Administered 2016-07-04: 10 mL
  Filled 2016-07-04: qty 10

## 2016-07-04 MED ORDER — HEPARIN SOD (PORK) LOCK FLUSH 100 UNIT/ML IV SOLN
500.0000 [IU] | Freq: Once | INTRAVENOUS | Status: AC | PRN
  Administered 2016-07-04: 500 [IU]
  Filled 2016-07-04: qty 5

## 2016-07-04 MED FILL — FLUCONAZOLE 100 MG TABLET: 100 | 7 days supply | Qty: 7 | Fill #0

## 2016-07-04 NOTE — Assessment & Plan Note (Signed)
The patient has profound side effects from recent treatment with weight loss, poor appetite, dehydration and oral thrush Recommend IV fluid hydration today and daily through the next few days I will also provide prescription for thrush and I will see him back next week for further supportive care

## 2016-07-04 NOTE — Telephone Encounter (Signed)
Gave patient spouse avs report and appointments for April.  Time for 4/4 and 4/6 appointments adjusted per wife - wife given updated schedule.

## 2016-07-04 NOTE — Progress Notes (Signed)
[facility] OFFICE PROGRESS NOTE  Patient Care Team: Velna Ochs, MD as PCP - General Leota Sauers, RN as Oncology Nurse Navigator Eppie Gibson, MD as Attending Physician (Radiation Oncology) Karie Mainland, RD as Dietitian (Nutrition)  SUMMARY OF ONCOLOGIC HISTORY:   Malignant neoplasm of supraglottis (Jonathan [name])   04/17/2016 Imaging    Ct neck showed relatively large approximately 3.5 cm laryngeal mass most compatible with laryngeal carcinoma. Epicenter at the glottis on the right. Extension across midline as well as involvement of laryngeal cartilages (possibly also bilateral). 2. Relatively small but malignant appearing right level 2 lymphadenopathy. Suspicious contralateral left level 2 lymph nodes.      04/21/2016 Initial Diagnosis    Jonathan [name] saw Dr. Constance Holster in the clinic for chronic hoarseness. In the office, it was noted that the posterior soft palate, uvula, tongue base Jonathan vallecula were visualized Jonathan appeared healthy without mucosal masses or lesions. There is a large mass involving the right side of the larynx, the right cord is fixed. There may be involvement of the post cricoid area it is difficult to say for sure. 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 Clinically this is at least staged as T3. The airway is significantly impaired. Jonathan [name] is at risk for impending airway obstruction. ENT recommended him for awake tracheostomy with direct laryngoscopy Jonathan biopsy Jonathan esophagoscopy under anesthesia immediately following that.       04/24/2016 Pathology Results    Larynx, biopsy, Right Side - POSITIVE FOR INVASIVE SQUAMOUS CELL CARCINOMA. - SEE COMMENT. Microscopic Comment As sampled, the squamous cell carcinoma appears well differentiated. A fragment of benign cartilage is also present in the specimen.      04/24/2016 Surgery    She underwent direct laryngoscopy with biopsy. An anterior commissure scope was used to evaluate the  larynx. A large mass Jonathan, using the right side of the larynx Jonathan the anterior commissure area was identified. Multiple biopsies were taken. The left cord was swollen but seemed to be uninvolved. Piriforms appeared to be clear. The post cricoid area appeared clear. Esophagoscopy was difficult Jonathan ENT surgeon was unable to pass the scope into the esophagus. Jonathan [name] had tracheostomy      04/24/2016 Pathology Results    Larynx, biopsy, Right Side - POSITIVE FOR INVASIVE SQUAMOUS CELL CARCINOMA. - SEE COMMENT. Microscopic Comment As sampled, the squamous cell carcinoma appears well differentiated. A fragment of benign cartilage is also present in the specimen.       05/15/2016 PET scan    Right eccentric glottic mass, appears to cross the anterior commissure with destructive findings of the thyroid cartilage. There is an upper normal size right station 2A lymph node with maximum SUV of 4.2 which is only very faintly elevated over background, but which could possibly represent a small amount of local metastatic spread. This is likely a T4a tumor Jonathan accordingly stage IVa whether not the right station 2 lymph node is positive. 2. Asymmetric calcification along the head of the right caudate nucleus Jonathan adjacent lentiform nucleus. Well this can sometimes be physiologic, there is some accompanying reduction in PET activity, Jonathan the asymmetry is an unusual feature. Accordingly I recommend MRI of the brain with Jonathan without contrast for further characterization. 3. A AP window lymph node is minimally enlarged at 1.1 cm, but with a maximum SUV below background mediastinal levels, likely benign.      05/23/2016 Imaging    MRI brain:  Atrophy with chronic microvascular ischemic change. No intracranial metastatic disease. 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        05/26/2016 Surgery    Multiple extraction of tooth numbers 2, 5, 6, 8-13, Jonathan  21-28. 2. 4 Quadrants of alveoloplasty. 3. Bilateral mandibular lingual tori reductions 4. Maxillary right, maxillary left, Jonathan mandibular left buccal exostoses reductions      06/02/2016 Procedure    Successful placement of a right internal jugular approach power injectable Port-A-Cath. The catheter is ready for immediate use      06/09/2016 -  Chemotherapy    Jonathan [name] received induction chemotherapy with taxotere, cisplatin Jonathan 5 FU       INTERVAL HISTORY: Please see below for problem oriented charting. Jonathan [name] is seen after chemotherapy Jonathan [name] appears weak Jonathan [name] said Jonathan [name] has not been eating well, only drinking some liquids over the past few days Jonathan [name] denies nausea vomiting Jonathan [name] has lost some weight Jonathan [name] denies significant pain Jonathan [name] denies changes in his bowel habits  REVIEW OF SYSTEMS:   Constitutional: Denies fevers, chills  Eyes: Denies blurriness of vision Ears, nose, mouth, throat, Jonathan face: Denies mucositis or sore throat Respiratory: Denies cough, dyspnea or wheezes Cardiovascular: Denies palpitation, chest discomfort or lower extremity swelling Gastrointestinal:  Denies nausea, heartburn or change in bowel habits Skin: Denies abnormal skin rashes Lymphatics: Denies new lymphadenopathy or easy bruising Neurological:Denies numbness, tingling or new weaknesses Behavioral/Psych: Mood is stable, no new changes  All other systems were reviewed with the patient Jonathan are negative.  I have reviewed the past medical history, past surgical history, social history Jonathan family history with the patient Jonathan they are unchanged from previous note.  ALLERGIES:  is allergic to no known allergies.  MEDICATIONS:  Current Outpatient Prescriptions  Medication Sig Dispense Refill  . dexamethasone (DECADRON) 4 MG tablet Take 2 tablets (8 mg total) by mouth 2 (two) times daily. Start the day before Taxotere. Take once the day after, then 2  times a day x 2d. 30 tablet 1  . lidocaine-prilocaine (EMLA) cream Apply to affected area once 30 g 3  . Morphine Sulfate (MORPHINE CONCENTRATE) 10 mg / 0.5 ml concentrated solution Take 0.5 mLs (10 mg total) by mouth every 2 (two) hours as needed for severe pain. 240 mL 0  . nicotine (NICODERM CQ - DOSED IN MG/24 HR) 7 mg/24hr patch Place 1 patch (7 mg total) onto the skin daily. 14 patch 0  . fluconazole (DIFLUCAN) 100 MG tablet Take 1 tablet (100 mg total) by mouth daily. 7 tablet 0  . LORazepam (ATIVAN) 0.5 MG tablet Take 1-2 tablets 30 min before MRI Jonathan before mask fabrication to help with claustrophobia. (Patient not taking: Reported on 06/13/2016) 4 tablet 0  . ondansetron (ZOFRAN) 8 MG tablet Take 1 tablet (8 mg total) by mouth 2 (two) times daily as needed (Nausea or vomiting). Begin 4 days after chemotherapy. (Patient not taking: Reported on 06/27/2016) 30 tablet 1  . prochlorperazine (COMPAZINE) 10 MG tablet Take 1 tablet (10 mg total) by mouth every 6 (six) hours as needed (Nausea or vomiting). (Patient not taking: Reported on 06/27/2016) 30 tablet 1  . promethazine (PHENERGAN) 25 MG suppository Place 1 suppository (25 mg total) rectally every 6 (six) hours as needed for nausea. (Patient not taking: Reported on 05/15/2016) 12 each 0   No current facility-administered medications for this visit.  Facility-Administered Medications Ordered in Other Visits  Medication Dose Route Frequency Provider Last Rate Last Dose  . heparin lock flush 100 unit/mL  500 Units Intracatheter Once PRN Heath Lark, MD      . pegfilgrastim (NEULASTA) injection 6 mg  6 mg Subcutaneous Once Heath Lark, MD      . sodium chloride flush (NS) 0.9 % injection 10 mL  10 mL Intracatheter PRN Heath Lark, MD        PHYSICAL EXAMINATION: ECOG PERFORMANCE STATUS: 2 - Symptomatic, <50% confined to bed  Vitals:   07/04/16 1501  BP: 123/85  Pulse: 97  Resp: 18  Temp: 99.3 F (37.4 C)   Filed Weights   07/04/16 1501   Weight: 128 lb (58.1 kg)    GENERAL:alert, no distress Jonathan comfortable SKIN: skin color, texture, turgor are normal, no rashes or significant lesions EYES: normal, Conjunctiva are pink Jonathan non-injected, sclera clear OROPHARYNX:n noted oral thrush NECK: Tracheostomy site looks okay  LYMPH:  no palpable lymphadenopathy in the cervical, axillary or inguinal LUNGS: clear to auscultation Jonathan percussion with normal breathing effort HEART: regular rate & rhythm Jonathan no murmurs Jonathan no lower extremity edema ABDOMEN:abdomen soft, non-tender Jonathan normal bowel sounds Musculoskeletal:no cyanosis of digits Jonathan no clubbing  NEURO: alert & oriented x 3 with fluent speech, no focal motor/sensory deficits  LABORATORY DATA:  I have reviewed the data as listed    Component Value Date/Time   NA 141 06/27/2016 1440   K 4.5 06/27/2016 1440   CL 101 06/02/2016 1237   CO2 27 06/27/2016 1440   GLUCOSE 118 06/27/2016 1440   BUN 5.5 (L) 06/27/2016 1440   CREATININE 0.8 06/27/2016 1440   CALCIUM 9.3 06/27/2016 1440   PROT 6.7 06/27/2016 1440   ALBUMIN 3.1 (L) 06/27/2016 1440   AST 19 06/27/2016 1440   ALT 16 06/27/2016 1440   ALKPHOS 110 06/27/2016 1440   BILITOT <0.22 06/27/2016 1440   GFRNONAA >60 06/02/2016 1237   GFRNONAA 82 03/06/2011 0853   GFRAA >60 06/02/2016 1237   GFRAA >89 03/06/2011 0853    No results found for: SPEP, UPEP  Lab Results  Component Value Date   WBC 8.5 06/27/2016   NEUTROABS 5.7 06/27/2016   HGB 10.4 (L) 06/27/2016   HCT 31.8 (L) 06/27/2016   MCV 90.9 06/27/2016   PLT 441 (H) 06/27/2016      Chemistry      Component Value Date/Time   NA 141 06/27/2016 1440   K 4.5 06/27/2016 1440   CL 101 06/02/2016 1237   CO2 27 06/27/2016 1440   BUN 5.5 (L) 06/27/2016 1440   CREATININE 0.8 06/27/2016 1440      Component Value Date/Time   CALCIUM 9.3 06/27/2016 1440   ALKPHOS 110 06/27/2016 1440   AST 19 06/27/2016 1440   ALT 16 06/27/2016 1440   BILITOT <0.22  06/27/2016 1440       ASSESSMENT & PLAN:  Malignant neoplasm of supraglottis ([name]) The patient has profound side effects from recent treatment with weight loss, poor appetite, dehydration Jonathan oral thrush Recommend IV fluid hydration today Jonathan daily through the next few days I will also provide prescription for thrush Jonathan I will see him back next week for further supportive care  Thrush, oral This is due to recent treatment I will start him on fluconazole Jonathan reassess next week  Status post tracheostomy ([place]) The tracheostomy site looks good without signs of bleeding or infection  Dehydration Jonathan [name] appears dehydrated  This could be contributed by recent yeast infection Jonathan poor oral intake I will provide IV fluid support daily Jonathan recheck his blood work next week to make sure Jonathan [name] did not develop kidney failure   No orders of the defined types were placed in this encounter.  All questions were answered. The patient knows to call the clinic with any problems, questions or concerns. No barriers to learning was detected. I spent 20 minutes counseling the patient face to face. The total time spent in the appointment was 25 minutes Jonathan more than 50% was on counseling Jonathan review of test results     Heath Lark, MD 07/04/2016 4:03 PM

## 2016-07-04 NOTE — Telephone Encounter (Signed)
Opened in error see previous note. °

## 2016-07-04 NOTE — Assessment & Plan Note (Signed)
The tracheostomy site looks good without signs of bleeding or infection

## 2016-07-04 NOTE — Assessment & Plan Note (Signed)
He appears dehydrated This could be contributed by recent yeast infection and poor oral intake I will provide IV fluid support daily and recheck his blood work next week to make sure he did not develop kidney failure

## 2016-07-04 NOTE — Patient Instructions (Signed)
Dehydration, Adult Dehydration is when there is not enough fluid or water in your body. This happens when you lose more fluids than you take in. Dehydration can range from mild to very bad. It should be treated right away to keep it from getting very bad. Symptoms of mild dehydration may include:   Thirst.  Dry lips.  Slightly dry mouth.  Dry, warm skin.  Dizziness. Symptoms of moderate dehydration may include:   Very dry mouth.  Muscle cramps.  Dark pee (urine). Pee may be the color of tea.  Your body making less pee.  Your eyes making fewer tears.  Heartbeat that is uneven or faster than normal (palpitations).  Headache.  Light-headedness, especially when you stand up from sitting.  Fainting (syncope). Symptoms of very bad dehydration may include:   Changes in skin, such as:  Cold and clammy skin.  Blotchy (mottled) or pale skin.  Skin that does not quickly return to normal after being lightly pinched and let go (poor skin turgor).  Changes in body fluids, such as:  Feeling very thirsty.  Your eyes making fewer tears.  Not sweating when body temperature is high, such as in hot weather.  Your body making very little pee.  Changes in vital signs, such as:  Weak pulse.  Pulse that is more than 100 beats a minute when you are sitting still.  Fast breathing.  Low blood pressure.  Other changes, such as:  Sunken eyes.  Cold hands and feet.  Confusion.  Lack of energy (lethargy).  Trouble waking up from sleep.  Short-term weight loss.  Unconsciousness. Follow these instructions at home:  If told by your doctor, drink an ORS:  Make an ORS by using instructions on the package.  Start by drinking small amounts, about  cup (120 mL) every 5-10 minutes.  Slowly drink more until you have had the amount that your doctor said to have.  Drink enough clear fluid to keep your pee clear or pale yellow. If you were told to drink an ORS, finish the  ORS first, then start slowly drinking clear fluids. Drink fluids such as:  Water. Do not drink only water by itself. Doing that can make the salt (sodium) level in your body get too low (hyponatremia).  Ice chips.  Fruit juice that you have added water to (diluted).  Low-calorie sports drinks.  Avoid:  Alcohol.  Drinks that have a lot of sugar. These include high-calorie sports drinks, fruit juice that does not have water added, and soda.  Caffeine.  Foods that are greasy or have a lot of fat or sugar.  Take over-the-counter and prescription medicines only as told by your doctor.  Do not take salt tablets. Doing that can make the salt level in your body get too high (hypernatremia).  Eat foods that have minerals (electrolytes). Examples include bananas, oranges, potatoes, tomatoes, and spinach.  Keep all follow-up visits as told by your doctor. This is important. Contact a doctor if:  You have belly (abdominal) pain that:  Gets worse.  Stays in one area (localizes).  You have a rash.  You have a stiff neck.  You get angry or annoyed more easily than normal (irritability).  You are more sleepy than normal.  You have a harder time waking up than normal.  You feel:  Weak.  Dizzy.  Very thirsty.  You have peed (urinated) only a small amount of very dark pee during 6-8 hours. Get help right away if:  You   have symptoms of very bad dehydration.  You cannot drink fluids without throwing up (vomiting).  Your symptoms get worse with treatment.  You have a fever.  You have a very bad headache.  You are throwing up or having watery poop (diarrhea) and it:  Gets worse.  Does not go away.  You have blood or something green (bile) in your throw-up.  You have blood in your poop (stool). This may cause poop to look black and tarry.  You have not peed in 6-8 hours.  You pass out (faint).  Your heart rate when you are sitting still is more than 100 beats a  minute.  You have trouble breathing. This information is not intended to replace advice given to you by your health care provider. Make sure you discuss any questions you have with your health care provider. Document Released: 01/14/2009 Document Revised: 10/08/2015 Document Reviewed: 05/14/2015 Elsevier Interactive Patient Education  2017 [employer]  

## 2016-07-04 NOTE — Assessment & Plan Note (Signed)
This is due to recent treatment I will start him on fluconazole and reassess next week

## 2016-07-05 ENCOUNTER — Ambulatory Visit (HOSPITAL_BASED_OUTPATIENT_CLINIC_OR_DEPARTMENT_OTHER): Payer: No Typology Code available for payment source | Admitting: Nurse Practitioner

## 2016-07-05 VITALS — BP 143/99 | HR 96 | Temp 99.1°F | Resp 19 | Ht 65.5 in | Wt 128.0 lb

## 2016-07-05 DIAGNOSIS — E860 Dehydration: Secondary | ICD-10-CM

## 2016-07-05 DIAGNOSIS — C321 Malignant neoplasm of supraglottis: Secondary | ICD-10-CM

## 2016-07-05 MED ORDER — SODIUM CHLORIDE 0.9% FLUSH
10.0000 mL | INTRAVENOUS | Status: DC | PRN
  Administered 2016-07-05: 10 mL
  Filled 2016-07-05: qty 10

## 2016-07-05 MED ORDER — PEGFILGRASTIM INJECTION 6 MG/0.6ML ~~LOC~~: 6.0000 mg | PREFILLED_SYRINGE | Freq: Once | SUBCUTANEOUS | Status: DC

## 2016-07-05 MED ORDER — HEPARIN SOD (PORK) LOCK FLUSH 100 UNIT/ML IV SOLN
500.0000 [IU] | Freq: Once | INTRAVENOUS | Status: AC | PRN
  Administered 2016-07-05: 500 [IU]
  Filled 2016-07-05: qty 5

## 2016-07-05 MED ORDER — SODIUM CHLORIDE 0.9 % IV SOLN
Freq: Once | INTRAVENOUS | Status: AC
  Administered 2016-07-05: 14:00:00 via INTRAVENOUS

## 2016-07-05 NOTE — Progress Notes (Signed)
RN visit for IV fluids.  Pt to come in next 2 days for additional fluids. Port left accessed for next 2 days with biopatch and sorbaview dressing in place

## 2016-07-05 NOTE — Patient Instructions (Signed)
Dehydration, Adult Dehydration is a condition in which there is not enough fluid or water in the body. This happens when you lose more fluids than you take in. Important organs, such as the kidneys, brain, and heart, cannot function without a proper amount of fluids. Any loss of fluids from the body can lead to dehydration. Dehydration can range from mild to severe. This condition should be treated right away to prevent it from becoming severe. What are the causes? This condition may be caused by:  Vomiting.  Diarrhea.  Excessive sweating, such as from heat exposure or exercise.  Not drinking enough fluid, especially:  When ill.  While doing activity that requires a lot of energy.  Excessive urination.  Fever.  Infection.  Certain medicines, such as medicines that cause the body to lose excess fluid (diuretics).  Inability to access safe drinking water.  Reduced physical ability to get adequate water and food. What increases the risk? This condition is more likely to develop in people:  Who have a poorly controlled long-term (chronic) illness, such as diabetes, heart disease, or kidney disease.  Who are age 65 or older.  Who are disabled.  Who live in a place with high altitude.  Who play endurance sports. What are the signs or symptoms? Symptoms of mild dehydration may include:   Thirst.  Dry lips.  Slightly dry mouth.  Dry, warm skin.  Dizziness. Symptoms of moderate dehydration may include:   Very dry mouth.  Muscle cramps.  Dark urine. Urine may be the color of tea.  Decreased urine production.  Decreased tear production.  Heartbeat that is irregular or faster than normal (palpitations).  Headache.  Light-headedness, especially when you stand up from a sitting position.  Fainting (syncope). Symptoms of severe dehydration may include:   Changes in skin, such as:  Cold and clammy skin.  Blotchy (mottled) or pale skin.  Skin that does  not quickly return to normal after being lightly pinched and released (poor skin turgor).  Changes in body fluids, such as:  Extreme thirst.  No tear production.  Inability to sweat when body temperature is high, such as in hot weather.  Very little urine production.  Changes in vital signs, such as:  Weak pulse.  Pulse that is more than 100 beats a minute when sitting still.  Rapid breathing.  Low blood pressure.  Other changes, such as:  Sunken eyes.  Cold hands and feet.  Confusion.  Lack of energy (lethargy).  Difficulty waking up from sleep.  Short-term weight loss.  Unconsciousness. How is this diagnosed? This condition is diagnosed based on your symptoms and a physical exam. Blood and urine tests may be done to help confirm the diagnosis. How is this treated? Treatment for this condition depends on the severity. Mild or moderate dehydration can often be treated at home. Treatment should be started right away. Do not wait until dehydration becomes severe. Severe dehydration is an emergency and it needs to be treated in a hospital. Treatment for mild dehydration may include:   Drinking more fluids.  Replacing salts and minerals in your blood (electrolytes) that you may have lost. Treatment for moderate dehydration may include:   Drinking an oral rehydration solution (ORS). This is a drink that helps you replace fluids and electrolytes (rehydrate). It can be found at pharmacies and retail stores. Treatment for severe dehydration may include:   Receiving fluids through an IV tube.  Receiving an electrolyte solution through a feeding tube that is   passed through your nose and into your stomach (nasogastric tube, or NG tube).  Correcting any abnormalities in electrolytes.  Treating the underlying cause of dehydration. Follow these instructions at home:  If directed by your health care provider, drink an ORS:  Make an ORS by following instructions on the  package.  Start by drinking small amounts, about  cup (120 mL) every 5-10 minutes.  Slowly increase how much you drink until you have taken the amount recommended by your health care provider.  Drink enough clear fluid to keep your urine clear or pale yellow. If you were told to drink an ORS, finish the ORS first, then start slowly drinking other clear fluids. Drink fluids such as:  Water. Do not drink only water. Doing that can lead to having too little salt (sodium) in the body (hyponatremia).  Ice chips.  Fruit juice that you have added water to (diluted fruit juice).  Low-calorie sports drinks.  Avoid:  Alcohol.  Drinks that contain a lot of sugar. These include high-calorie sports drinks, fruit juice that is not diluted, and soda.  Caffeine.  Foods that are greasy or contain a lot of fat or sugar.  Take over-the-counter and prescription medicines only as told by your health care provider.  Do not take sodium tablets. This can lead to having too much sodium in the body (hypernatremia).  Eat foods that contain a healthy balance of electrolytes, such as bananas, oranges, potatoes, tomatoes, and spinach.  Keep all follow-up visits as told by your health care provider. This is important. Contact a health care provider if:  You have abdominal pain that:  Gets worse.  Stays in one area (localizes).  You have a rash.  You have a stiff neck.  You are more irritable than usual.  You are sleepier or more difficult to wake up than usual.  You feel weak or dizzy.  You feel very thirsty.  You have urinated only a small amount of very dark urine over 6-8 hours. Get help right away if:  You have symptoms of severe dehydration.  You cannot drink fluids without vomiting.  Your symptoms get worse with treatment.  You have a fever.  You have a severe headache.  You have vomiting or diarrhea that:  Gets worse.  Does not go away.  You have blood or green matter  (bile) in your vomit.  You have blood in your stool. This may cause stool to look black and tarry.  You have not urinated in 6-8 hours.  You faint.  Your heart rate while sitting still is over 100 beats a minute.  You have trouble breathing. This information is not intended to replace advice given to you by your health care provider. Make sure you discuss any questions you have with your health care provider. Document Released: 03/20/2005 Document Revised: 10/15/2015 Document Reviewed: 05/14/2015 Elsevier Interactive Patient Education  2017 [employer]  

## 2016-07-06 ENCOUNTER — Ambulatory Visit (HOSPITAL_BASED_OUTPATIENT_CLINIC_OR_DEPARTMENT_OTHER): Payer: No Typology Code available for payment source | Admitting: Nurse Practitioner

## 2016-07-06 VITALS — BP 148/91 | HR 97 | Temp 99.1°F | Resp 16

## 2016-07-06 DIAGNOSIS — C321 Malignant neoplasm of supraglottis: Secondary | ICD-10-CM

## 2016-07-06 DIAGNOSIS — E860 Dehydration: Secondary | ICD-10-CM

## 2016-07-06 MED ORDER — SODIUM CHLORIDE 0.9% FLUSH
10.0000 mL | INTRAVENOUS | Status: DC | PRN
  Administered 2016-07-06: 10 mL
  Filled 2016-07-06: qty 10

## 2016-07-06 MED ORDER — HEPARIN SOD (PORK) LOCK FLUSH 100 UNIT/ML IV SOLN
250.0000 [IU] | Freq: Once | INTRAVENOUS | Status: AC | PRN
  Administered 2016-07-06: 500 [IU]
  Filled 2016-07-06: qty 5

## 2016-07-06 MED ORDER — SODIUM CHLORIDE 0.9 % IV SOLN
Freq: Once | INTRAVENOUS | Status: AC
  Administered 2016-07-06: 12:00:00 via INTRAVENOUS

## 2016-07-06 NOTE — Patient Instructions (Signed)
Dehydration, Adult Dehydration is a condition in which there is not enough fluid or water in the body. This happens when you lose more fluids than you take in. Important organs, such as the kidneys, brain, and heart, cannot function without a proper amount of fluids. Any loss of fluids from the body can lead to dehydration. Dehydration can range from mild to severe. This condition should be treated right away to prevent it from becoming severe. What are the causes? This condition may be caused by:  Vomiting.  Diarrhea.  Excessive sweating, such as from heat exposure or exercise.  Not drinking enough fluid, especially:  When ill.  While doing activity that requires a lot of energy.  Excessive urination.  Fever.  Infection.  Certain medicines, such as medicines that cause the body to lose excess fluid (diuretics).  Inability to access safe drinking water.  Reduced physical ability to get adequate water and food. What increases the risk? This condition is more likely to develop in people:  Who have a poorly controlled long-term (chronic) illness, such as diabetes, heart disease, or kidney disease.  Who are age 65 or older.  Who are disabled.  Who live in a place with high altitude.  Who play endurance sports. What are the signs or symptoms? Symptoms of mild dehydration may include:   Thirst.  Dry lips.  Slightly dry mouth.  Dry, warm skin.  Dizziness. Symptoms of moderate dehydration may include:   Very dry mouth.  Muscle cramps.  Dark urine. Urine may be the color of tea.  Decreased urine production.  Decreased tear production.  Heartbeat that is irregular or faster than normal (palpitations).  Headache.  Light-headedness, especially when you stand up from a sitting position.  Fainting (syncope). Symptoms of severe dehydration may include:   Changes in skin, such as:  Cold and clammy skin.  Blotchy (mottled) or pale skin.  Skin that does  not quickly return to normal after being lightly pinched and released (poor skin turgor).  Changes in body fluids, such as:  Extreme thirst.  No tear production.  Inability to sweat when body temperature is high, such as in hot weather.  Very little urine production.  Changes in vital signs, such as:  Weak pulse.  Pulse that is more than 100 beats a minute when sitting still.  Rapid breathing.  Low blood pressure.  Other changes, such as:  Sunken eyes.  Cold hands and feet.  Confusion.  Lack of energy (lethargy).  Difficulty waking up from sleep.  Short-term weight loss.  Unconsciousness. How is this diagnosed? This condition is diagnosed based on your symptoms and a physical exam. Blood and urine tests may be done to help confirm the diagnosis. How is this treated? Treatment for this condition depends on the severity. Mild or moderate dehydration can often be treated at home. Treatment should be started right away. Do not wait until dehydration becomes severe. Severe dehydration is an emergency and it needs to be treated in a hospital. Treatment for mild dehydration may include:   Drinking more fluids.  Replacing salts and minerals in your blood (electrolytes) that you may have lost. Treatment for moderate dehydration may include:   Drinking an oral rehydration solution (ORS). This is a drink that helps you replace fluids and electrolytes (rehydrate). It can be found at pharmacies and retail stores. Treatment for severe dehydration may include:   Receiving fluids through an IV tube.  Receiving an electrolyte solution through a feeding tube that is   passed through your nose and into your stomach (nasogastric tube, or NG tube).  Correcting any abnormalities in electrolytes.  Treating the underlying cause of dehydration. Follow these instructions at home:  If directed by your health care provider, drink an ORS:  Make an ORS by following instructions on the  package.  Start by drinking small amounts, about  cup (120 mL) every 5-10 minutes.  Slowly increase how much you drink until you have taken the amount recommended by your health care provider.  Drink enough clear fluid to keep your urine clear or pale yellow. If you were told to drink an ORS, finish the ORS first, then start slowly drinking other clear fluids. Drink fluids such as:  Water. Do not drink only water. Doing that can lead to having too little salt (sodium) in the body (hyponatremia).  Ice chips.  Fruit juice that you have added water to (diluted fruit juice).  Low-calorie sports drinks.  Avoid:  Alcohol.  Drinks that contain a lot of sugar. These include high-calorie sports drinks, fruit juice that is not diluted, and soda.  Caffeine.  Foods that are greasy or contain a lot of fat or sugar.  Take over-the-counter and prescription medicines only as told by your health care provider.  Do not take sodium tablets. This can lead to having too much sodium in the body (hypernatremia).  Eat foods that contain a healthy balance of electrolytes, such as bananas, oranges, potatoes, tomatoes, and spinach.  Keep all follow-up visits as told by your health care provider. This is important. Contact a health care provider if:  You have abdominal pain that:  Gets worse.  Stays in one area (localizes).  You have a rash.  You have a stiff neck.  You are more irritable than usual.  You are sleepier or more difficult to wake up than usual.  You feel weak or dizzy.  You feel very thirsty.  You have urinated only a small amount of very dark urine over 6-8 hours. Get help right away if:  You have symptoms of severe dehydration.  You cannot drink fluids without vomiting.  Your symptoms get worse with treatment.  You have a fever.  You have a severe headache.  You have vomiting or diarrhea that:  Gets worse.  Does not go away.  You have blood or green matter  (bile) in your vomit.  You have blood in your stool. This may cause stool to look black and tarry.  You have not urinated in 6-8 hours.  You faint.  Your heart rate while sitting still is over 100 beats a minute.  You have trouble breathing. This information is not intended to replace advice given to you by your health care provider. Make sure you discuss any questions you have with your health care provider. Document Released: 03/20/2005 Document Revised: 10/15/2015 Document Reviewed: 05/14/2015 Elsevier Interactive Patient Education  2017 [employer]  

## 2016-07-06 NOTE — Progress Notes (Signed)
RN visit for IV fluids. 

## 2016-07-07 ENCOUNTER — Ambulatory Visit (HOSPITAL_BASED_OUTPATIENT_CLINIC_OR_DEPARTMENT_OTHER): Payer: No Typology Code available for payment source | Admitting: Nurse Practitioner

## 2016-07-07 ENCOUNTER — Encounter: Payer: Self-pay | Admitting: Nutrition

## 2016-07-07 ENCOUNTER — Ambulatory Visit: Payer: Self-pay

## 2016-07-07 VITALS — BP 136/86 | HR 89 | Temp 99.0°F | Resp 18 | Ht 65.5 in | Wt 126.9 lb

## 2016-07-07 DIAGNOSIS — E860 Dehydration: Secondary | ICD-10-CM

## 2016-07-07 DIAGNOSIS — C321 Malignant neoplasm of supraglottis: Secondary | ICD-10-CM

## 2016-07-07 MED ORDER — SODIUM CHLORIDE 0.9% FLUSH
10.0000 mL | INTRAVENOUS | Status: DC | PRN
  Administered 2016-07-07: 10 mL
  Filled 2016-07-07: qty 10

## 2016-07-07 MED ORDER — SODIUM CHLORIDE 0.9 % IV SOLN
Freq: Once | INTRAVENOUS | Status: AC
  Administered 2016-07-07: 14:00:00 via INTRAVENOUS

## 2016-07-07 MED ORDER — HEPARIN SOD (PORK) LOCK FLUSH 100 UNIT/ML IV SOLN
500.0000 [IU] | Freq: Once | INTRAVENOUS | Status: AC | PRN
  Administered 2016-07-07: 500 [IU]
  Filled 2016-07-07: qty 5

## 2016-07-07 NOTE — Patient Instructions (Signed)
Dehydration, Adult Dehydration is a condition in which there is not enough fluid or water in the body. This happens when you lose more fluids than you take in. Important organs, such as the kidneys, brain, and heart, cannot function without a proper amount of fluids. Any loss of fluids from the body can lead to dehydration. Dehydration can range from mild to severe. This condition should be treated right away to prevent it from becoming severe. What are the causes? This condition may be caused by:  Vomiting.  Diarrhea.  Excessive sweating, such as from heat exposure or exercise.  Not drinking enough fluid, especially:  When ill.  While doing activity that requires a lot of energy.  Excessive urination.  Fever.  Infection.  Certain medicines, such as medicines that cause the body to lose excess fluid (diuretics).  Inability to access safe drinking water.  Reduced physical ability to get adequate water and food. What increases the risk? This condition is more likely to develop in people:  Who have a poorly controlled long-term (chronic) illness, such as diabetes, heart disease, or kidney disease.  Who are age 65 or older.  Who are disabled.  Who live in a place with high altitude.  Who play endurance sports. What are the signs or symptoms? Symptoms of mild dehydration may include:   Thirst.  Dry lips.  Slightly dry mouth.  Dry, warm skin.  Dizziness. Symptoms of moderate dehydration may include:   Very dry mouth.  Muscle cramps.  Dark urine. Urine may be the color of tea.  Decreased urine production.  Decreased tear production.  Heartbeat that is irregular or faster than normal (palpitations).  Headache.  Light-headedness, especially when you stand up from a sitting position.  Fainting (syncope). Symptoms of severe dehydration may include:   Changes in skin, such as:  Cold and clammy skin.  Blotchy (mottled) or pale skin.  Skin that does  not quickly return to normal after being lightly pinched and released (poor skin turgor).  Changes in body fluids, such as:  Extreme thirst.  No tear production.  Inability to sweat when body temperature is high, such as in hot weather.  Very little urine production.  Changes in vital signs, such as:  Weak pulse.  Pulse that is more than 100 beats a minute when sitting still.  Rapid breathing.  Low blood pressure.  Other changes, such as:  Sunken eyes.  Cold hands and feet.  Confusion.  Lack of energy (lethargy).  Difficulty waking up from sleep.  Short-term weight loss.  Unconsciousness. How is this diagnosed? This condition is diagnosed based on your symptoms and a physical exam. Blood and urine tests may be done to help confirm the diagnosis. How is this treated? Treatment for this condition depends on the severity. Mild or moderate dehydration can often be treated at home. Treatment should be started right away. Do not wait until dehydration becomes severe. Severe dehydration is an emergency and it needs to be treated in a hospital. Treatment for mild dehydration may include:   Drinking more fluids.  Replacing salts and minerals in your blood (electrolytes) that you may have lost. Treatment for moderate dehydration may include:   Drinking an oral rehydration solution (ORS). This is a drink that helps you replace fluids and electrolytes (rehydrate). It can be found at pharmacies and retail stores. Treatment for severe dehydration may include:   Receiving fluids through an IV tube.  Receiving an electrolyte solution through a feeding tube that is   passed through your nose and into your stomach (nasogastric tube, or NG tube).  Correcting any abnormalities in electrolytes.  Treating the underlying cause of dehydration. Follow these instructions at home:  If directed by your health care provider, drink an ORS:  Make an ORS by following instructions on the  package.  Start by drinking small amounts, about  cup (120 mL) every 5-10 minutes.  Slowly increase how much you drink until you have taken the amount recommended by your health care provider.  Drink enough clear fluid to keep your urine clear or pale yellow. If you were told to drink an ORS, finish the ORS first, then start slowly drinking other clear fluids. Drink fluids such as:  Water. Do not drink only water. Doing that can lead to having too little salt (sodium) in the body (hyponatremia).  Ice chips.  Fruit juice that you have added water to (diluted fruit juice).  Low-calorie sports drinks.  Avoid:  Alcohol.  Drinks that contain a lot of sugar. These include high-calorie sports drinks, fruit juice that is not diluted, and soda.  Caffeine.  Foods that are greasy or contain a lot of fat or sugar.  Take over-the-counter and prescription medicines only as told by your health care provider.  Do not take sodium tablets. This can lead to having too much sodium in the body (hypernatremia).  Eat foods that contain a healthy balance of electrolytes, such as bananas, oranges, potatoes, tomatoes, and spinach.  Keep all follow-up visits as told by your health care provider. This is important. Contact a health care provider if:  You have abdominal pain that:  Gets worse.  Stays in one area (localizes).  You have a rash.  You have a stiff neck.  You are more irritable than usual.  You are sleepier or more difficult to wake up than usual.  You feel weak or dizzy.  You feel very thirsty.  You have urinated only a small amount of very dark urine over 6-8 hours. Get help right away if:  You have symptoms of severe dehydration.  You cannot drink fluids without vomiting.  Your symptoms get worse with treatment.  You have a fever.  You have a severe headache.  You have vomiting or diarrhea that:  Gets worse.  Does not go away.  You have blood or green matter  (bile) in your vomit.  You have blood in your stool. This may cause stool to look black and tarry.  You have not urinated in 6-8 hours.  You faint.  Your heart rate while sitting still is over 100 beats a minute.  You have trouble breathing. This information is not intended to replace advice given to you by your health care provider. Make sure you discuss any questions you have with your health care provider. Document Released: 03/20/2005 Document Revised: 10/15/2015 Document Reviewed: 05/14/2015 Elsevier Interactive Patient Education  2017 [employer]  

## 2016-07-07 NOTE — Progress Notes (Signed)
RN visit for IV fluids. 

## 2016-07-10 ENCOUNTER — Ambulatory Visit (HOSPITAL_BASED_OUTPATIENT_CLINIC_OR_DEPARTMENT_OTHER): Payer: No Typology Code available for payment source | Admitting: Hematology and Oncology

## 2016-07-10 ENCOUNTER — Encounter: Payer: Self-pay | Admitting: *Deleted

## 2016-07-10 ENCOUNTER — Encounter: Payer: Self-pay | Admitting: Hematology and Oncology

## 2016-07-10 ENCOUNTER — Other Ambulatory Visit: Payer: Self-pay | Admitting: *Deleted

## 2016-07-10 DIAGNOSIS — C321 Malignant neoplasm of supraglottis: Secondary | ICD-10-CM

## 2016-07-10 DIAGNOSIS — E860 Dehydration: Secondary | ICD-10-CM

## 2016-07-10 DIAGNOSIS — B370 Candidal stomatitis: Secondary | ICD-10-CM

## 2016-07-10 MED ORDER — PROCHLORPERAZINE MALEATE 10 MG PO TABS: 10.0000 mg | ORAL_TABLET | Freq: Four times a day (QID) | ORAL | 1 refills | Status: DC | PRN

## 2016-07-10 MED FILL — PROCHLORPERAZINE 10 MG TAB: 10 | 7 days supply | Qty: 30 | Fill #0

## 2016-07-10 NOTE — Assessment & Plan Note (Signed)
Oral thrush has resolved. Continue close observation

## 2016-07-10 NOTE — Progress Notes (Signed)
Oncology Nurse Navigator Documentation  Flowsheet update.  Rick Diehl, RN, BSN, CHPN Head & Neck Oncology Nurse Navigator  [facility] [phone]  

## 2016-07-10 NOTE — Progress Notes (Signed)
[facility] OFFICE PROGRESS NOTE  Patient Care Team: Velna Ochs, MD as PCP - General Leota Sauers, RN as Oncology Nurse Navigator Eppie Gibson, MD as Attending Physician (Radiation Oncology) Karie Mainland, RD as Dietitian (Nutrition)  SUMMARY OF ONCOLOGIC HISTORY:   Malignant neoplasm of supraglottis ([place])   04/17/2016 Imaging    Ct neck showed relatively large approximately 3.5 cm laryngeal mass most compatible with laryngeal carcinoma. Epicenter at the glottis on the right. Extension across midline as well as involvement of laryngeal cartilages (possibly also bilateral). 2. Relatively small but malignant appearing right level 2 lymphadenopathy. Suspicious contralateral left level 2 lymph nodes.      04/21/2016 Initial Diagnosis    He saw Dr. Constance Holster in the clinic for chronic hoarseness. In the office, it was noted that the posterior soft palate, uvula, tongue base and vallecula were visualized and appeared healthy without mucosal masses or lesions. There is a large mass involving the right side of the larynx, the right cord is fixed. There may be involvement of the post cricoid area it is difficult to say for sure. 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 Clinically this is at least staged as T3. The airway is significantly impaired. He is at risk for impending airway obstruction. ENT recommended him for awake tracheostomy with direct laryngoscopy and biopsy and esophagoscopy under anesthesia immediately following that.       04/24/2016 Pathology Results    Larynx, biopsy, Right Side - POSITIVE FOR INVASIVE SQUAMOUS CELL CARCINOMA. - SEE COMMENT. Microscopic Comment As sampled, the squamous cell carcinoma appears well differentiated. A fragment of benign cartilage is also present in the specimen.      04/24/2016 Surgery    She underwent direct laryngoscopy with biopsy. An anterior commissure scope was used to evaluate the  larynx. A large mass and, using the right side of the larynx and the anterior commissure area was identified. Multiple biopsies were taken. The left cord was swollen but seemed to be uninvolved. Piriforms appeared to be clear. The post cricoid area appeared clear. Esophagoscopy was difficult and ENT surgeon was unable to pass the scope into the esophagus. He had tracheostomy      04/24/2016 Pathology Results    Larynx, biopsy, Right Side - POSITIVE FOR INVASIVE SQUAMOUS CELL CARCINOMA. - SEE COMMENT. Microscopic Comment As sampled, the squamous cell carcinoma appears well differentiated. A fragment of benign cartilage is also present in the specimen.       05/15/2016 PET scan    Right eccentric glottic mass, appears to cross the anterior commissure with destructive findings of the thyroid cartilage. There is an upper normal size right station 2A lymph node with maximum SUV of 4.2 which is only very faintly elevated over background, but which could possibly represent a small amount of local metastatic spread. 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 2. Asymmetric calcification along the head of the right caudate nucleus and adjacent lentiform nucleus. Well this can sometimes be physiologic, there is some accompanying reduction in PET activity, and the asymmetry is an unusual feature. Accordingly I recommend MRI of the brain with and without contrast for further characterization. 3. A AP window lymph node is minimally enlarged at 1.1 cm, but with a maximum SUV below background mediastinal levels, likely benign.      05/23/2016 Imaging    MRI brain:  Atrophy with chronic microvascular ischemic change. No intracranial metastatic disease. 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        05/26/2016 Surgery    Multiple extraction of tooth numbers 2, 5, 6, 8-13, and  21-28. 2. 4 Quadrants of alveoloplasty. 3. Bilateral mandibular lingual tori reductions 4. Maxillary right, maxillary left, and mandibular left buccal exostoses reductions      06/02/2016 Procedure    Successful placement of a right internal jugular approach power injectable Port-A-Cath. The catheter is ready for immediate use      06/09/2016 -  Chemotherapy    He received induction chemotherapy with taxotere, cisplatin and 5 FU       INTERVAL HISTORY: Please see below for problem oriented charting. He returns today for further follow-up. He felt better. He is gaining weight. His nausea & vomiting are well controlled.  He denies significant swallowing difficulties  REVIEW OF SYSTEMS:   Constitutional: Denies fevers, chills or abnormal weight loss Eyes: Denies blurriness of vision Ears, nose, mouth, throat, and face: Denies mucositis or sore throat Respiratory: Denies cough, dyspnea or wheezes Cardiovascular: Denies palpitation, chest discomfort or lower extremity swelling Skin: Denies abnormal skin rashes Lymphatics: Denies new lymphadenopathy or easy bruising Neurological:Denies numbness, tingling or new weaknesses Behavioral/Psych: Mood is stable, no new changes  All other systems were reviewed with the patient and are negative.  I have reviewed the past medical history, past surgical history, social history and family history with the patient and they are unchanged from previous note.  ALLERGIES:  is allergic to no known allergies.  MEDICATIONS:  Current Outpatient Prescriptions  Medication Sig Dispense Refill  . dexamethasone (DECADRON) 4 MG tablet Take 2 tablets (8 mg total) by mouth 2 (two) times daily. Start the day before Taxotere. Take once the day after, then 2 times a day x 2d. 30 tablet 1  . fluconazole (DIFLUCAN) 100 MG tablet Take 1 tablet (100 mg total) by mouth daily. 7 tablet 0  .  lidocaine-prilocaine (EMLA) cream Apply to affected area once 30 g 3  . LORazepam (ATIVAN) 0.5 MG tablet Take 1-2 tablets 30 min before MRI and before mask fabrication to help with claustrophobia. 4 tablet 0  . Morphine Sulfate (MORPHINE CONCENTRATE) 10 mg / 0.5 ml concentrated solution Take 0.5 mLs (10 mg total) by mouth every 2 (two) hours as needed for severe pain. 240 mL 0  . nicotine (NICODERM CQ - DOSED IN MG/24 HR) 7 mg/24hr patch Place 1 patch (7 mg total) onto the skin daily. 14 patch 0  . prochlorperazine (COMPAZINE) 10 MG tablet Take 1 tablet (10 mg total) by mouth every 6 (six) hours as needed (Nausea or vomiting). 30 tablet 1  . ondansetron (ZOFRAN) 8 MG tablet Take 1 tablet (8 mg total) by mouth 2 (two) times daily as needed (Nausea or vomiting). Begin 4 days after chemotherapy. (Patient not taking: Reported on 06/27/2016) 30 tablet 1  . promethazine (PHENERGAN) 25 MG suppository Place 1 suppository (25 mg total) rectally every 6 (six) hours as needed for nausea. (Patient not taking: Reported on 05/15/2016) 12 each 0   No current facility-administered medications for this visit.     PHYSICAL EXAMINATION: ECOG PERFORMANCE STATUS: 0 - Asymptomatic  Vitals:   07/10/16 1404  BP: 114/78  Pulse: 89  Resp: 18  Temp: 98.3 F (36.8 C)   Filed Weights   07/10/16  1404  Weight: 129 lb 1.6 oz (58.6 kg)    GENERAL:alert, no distress and comfortable SKIN: skin color, texture, turgor are normal, no rashes or significant lesions EYES: normal, Conjunctiva are pink and non-injected, sclera clear OROPHARYNX:no exudate, no erythema and lips, buccal mucosa, and tongue normal.  Thrush has resolved NECK: Tracheostomy site looks okay LYMPH:  no palpable lymphadenopathy in the cervical, axillary or inguinal LUNGS: clear to auscultation and percussion with normal breathing effort HEART: regular rate & rhythm and no murmurs and no lower extremity edema ABDOMEN:abdomen soft, non-tender and normal  bowel sounds Musculoskeletal:no cyanosis of digits and no clubbing  NEURO: alert & oriented x 3 with fluent speech, no focal motor/sensory deficits  LABORATORY DATA:  I have reviewed the data as listed    Component Value Date/Time   NA 141 06/27/2016 1440   K 4.5 06/27/2016 1440   CL 101 06/02/2016 1237   CO2 27 06/27/2016 1440   GLUCOSE 118 06/27/2016 1440   BUN 5.5 (L) 06/27/2016 1440   CREATININE 0.8 06/27/2016 1440   CALCIUM 9.3 06/27/2016 1440   PROT 6.7 06/27/2016 1440   ALBUMIN 3.1 (L) 06/27/2016 1440   AST 19 06/27/2016 1440   ALT 16 06/27/2016 1440   ALKPHOS 110 06/27/2016 1440   BILITOT <0.22 06/27/2016 1440   GFRNONAA >60 06/02/2016 1237   GFRNONAA 82 03/06/2011 0853   GFRAA >60 06/02/2016 1237   GFRAA >89 03/06/2011 0853    No results found for: SPEP, UPEP  Lab Results  Component Value Date   WBC 8.5 06/27/2016   NEUTROABS 5.7 06/27/2016   HGB 10.4 (L) 06/27/2016   HCT 31.8 (L) 06/27/2016   MCV 90.9 06/27/2016   PLT 441 (H) 06/27/2016      Chemistry      Component Value Date/Time   NA 141 06/27/2016 1440   K 4.5 06/27/2016 1440   CL 101 06/02/2016 1237   CO2 27 06/27/2016 1440   BUN 5.5 (L) 06/27/2016 1440   CREATININE 0.8 06/27/2016 1440      Component Value Date/Time   CALCIUM 9.3 06/27/2016 1440   ALKPHOS 110 06/27/2016 1440   AST 19 06/27/2016 1440   ALT 16 06/27/2016 1440   BILITOT <0.22 06/27/2016 1440       ASSESSMENT & PLAN:  Malignant neoplasm of supraglottis ([facility]) He is recovering from recent side effects of treatment. I will see him again next week view and supportive care before final cycle of treatment  Thrush, oral Oral thrush has resolved. Continue close observation  Dehydration He is eating better.  He has gained some weight. He does not need further IV fluid support. I will see him next week as scheduled.  He will continue to take antiemetics as needed.    No orders of the defined types were placed in this  encounter.  All questions were answered. The patient knows to call the clinic with any problems, questions or concerns. No barriers to learning was detected. I spent 10 minutes counseling the patient face to face. The total time spent in the appointment was 15 minutes and more than 50% was on counseling and review of test results     Heath Lark, MD 07/10/2016 2:19 PM

## 2016-07-10 NOTE — Assessment & Plan Note (Signed)
He is recovering from recent side effects of treatment. I will see him again next week view and supportive care before final cycle of treatment

## 2016-07-10 NOTE — Assessment & Plan Note (Signed)
He is eating better.  He has gained some weight. He does not need further IV fluid support. I will see him next week as scheduled.  He will continue to take antiemetics as needed.

## 2016-07-18 ENCOUNTER — Telehealth: Payer: Self-pay | Admitting: Hematology and Oncology

## 2016-07-18 ENCOUNTER — Encounter: Payer: Self-pay | Admitting: *Deleted

## 2016-07-18 ENCOUNTER — Ambulatory Visit (HOSPITAL_BASED_OUTPATIENT_CLINIC_OR_DEPARTMENT_OTHER): Payer: No Typology Code available for payment source | Admitting: Hematology and Oncology

## 2016-07-18 ENCOUNTER — Other Ambulatory Visit (HOSPITAL_BASED_OUTPATIENT_CLINIC_OR_DEPARTMENT_OTHER): Payer: No Typology Code available for payment source

## 2016-07-18 ENCOUNTER — Encounter: Payer: Self-pay | Admitting: Hematology and Oncology

## 2016-07-18 VITALS — BP 129/82 | HR 94 | Temp 99.1°F | Resp 18 | Ht 65.5 in | Wt 126.5 lb

## 2016-07-18 DIAGNOSIS — C321 Malignant neoplasm of supraglottis: Secondary | ICD-10-CM

## 2016-07-18 DIAGNOSIS — D6481 Anemia due to antineoplastic chemotherapy: Secondary | ICD-10-CM

## 2016-07-18 DIAGNOSIS — Z930 Tracheostomy status: Secondary | ICD-10-CM

## 2016-07-18 DIAGNOSIS — T451X5A Adverse effect of antineoplastic and immunosuppressive drugs, initial encounter: Secondary | ICD-10-CM

## 2016-07-18 DIAGNOSIS — R634 Abnormal weight loss: Secondary | ICD-10-CM

## 2016-07-18 LAB — CBC WITH DIFFERENTIAL/PLATELET
BASO%: 0.6 % (ref 0.0–2.0)
Basophils Absolute: 0.1 10*3/uL (ref 0.0–0.1)
EOS ABS: 0 10*3/uL (ref 0.0–0.5)
EOS%: 0.2 % (ref 0.0–7.0)
HCT: 31.1 % — ABNORMAL LOW (ref 38.4–49.9)
HEMOGLOBIN: 10.3 g/dL — AB (ref 13.0–17.1)
LYMPH%: 12.9 % — ABNORMAL LOW (ref 14.0–49.0)
MCH: 29.7 pg (ref 27.2–33.4)
MCHC: 33.2 g/dL (ref 32.0–36.0)
MCV: 89.7 fL (ref 79.3–98.0)
MONO#: 1 10*3/uL — ABNORMAL HIGH (ref 0.1–0.9)
MONO%: 7.3 % (ref 0.0–14.0)
NEUT%: 79 % — ABNORMAL HIGH (ref 39.0–75.0)
NEUTROS ABS: 10.8 10*3/uL — AB (ref 1.5–6.5)
Platelets: 372 10*3/uL (ref 140–400)
RBC: 3.46 10*6/uL — ABNORMAL LOW (ref 4.20–5.82)
RDW: 15.4 % — AB (ref 11.0–14.6)
WBC: 13.7 10*3/uL — AB (ref 4.0–10.3)
lymph#: 1.8 10*3/uL (ref 0.9–3.3)

## 2016-07-18 LAB — COMPREHENSIVE METABOLIC PANEL
ALBUMIN: 3.1 g/dL — AB (ref 3.5–5.0)
ALK PHOS: 124 U/L (ref 40–150)
ALT: 9 U/L (ref 0–55)
ANION GAP: 10 meq/L (ref 3–11)
AST: 13 U/L (ref 5–34)
BILIRUBIN TOTAL: 0.26 mg/dL (ref 0.20–1.20)
BUN: 6.9 mg/dL — ABNORMAL LOW (ref 7.0–26.0)
CO2: 26 mEq/L (ref 22–29)
Calcium: 9.1 mg/dL (ref 8.4–10.4)
Chloride: 105 mEq/L (ref 98–109)
Creatinine: 0.8 mg/dL (ref 0.7–1.3)
GLUCOSE: 132 mg/dL (ref 70–140)
Potassium: 4.1 mEq/L (ref 3.5–5.1)
SODIUM: 141 meq/L (ref 136–145)
TOTAL PROTEIN: 6.5 g/dL (ref 6.4–8.3)

## 2016-07-18 LAB — MAGNESIUM: Magnesium: 1.9 mg/dl (ref 1.5–2.5)

## 2016-07-18 NOTE — Assessment & Plan Note (Signed)
The tracheostomy site looks good without signs of bleeding or infection

## 2016-07-18 NOTE — Progress Notes (Signed)
[facility] OFFICE PROGRESS NOTE  Patient Care Team: Velna Ochs, MD as PCP - General Leota Sauers, RN as Oncology Nurse Navigator Eppie Gibson, MD as Attending Physician (Radiation Oncology) Karie Mainland, RD as Dietitian (Nutrition)  SUMMARY OF ONCOLOGIC HISTORY:   Malignant neoplasm of supraglottis ([place])   04/17/2016 Imaging    Ct neck showed relatively large approximately 3.5 cm laryngeal mass most compatible with laryngeal carcinoma. Epicenter at the glottis on the right. Extension across midline as well as involvement of laryngeal cartilages (possibly also bilateral). 2. Relatively small but malignant appearing right level 2 lymphadenopathy. Suspicious contralateral left level 2 lymph nodes.      04/21/2016 Initial Diagnosis    He saw Dr. Constance Holster in the clinic for chronic hoarseness. In the office, it was noted that the posterior soft palate, uvula, tongue base and vallecula were visualized and appeared healthy without mucosal masses or lesions. There is a large mass involving the right side of the larynx, the right cord is fixed. There may be involvement of the post cricoid area it is difficult to say for sure. 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 Clinically this is at least staged as T3. The airway is significantly impaired. He is at risk for impending airway obstruction. ENT recommended him for awake tracheostomy with direct laryngoscopy and biopsy and esophagoscopy under anesthesia immediately following that.       04/24/2016 Pathology Results    Larynx, biopsy, Right Side - POSITIVE FOR INVASIVE SQUAMOUS CELL CARCINOMA. - SEE COMMENT. Microscopic Comment As sampled, the squamous cell carcinoma appears well differentiated. A fragment of benign cartilage is also present in the specimen.      04/24/2016 Surgery    She underwent direct laryngoscopy with biopsy. An anterior commissure scope was used to evaluate the  larynx. A large mass and, using the right side of the larynx and the anterior commissure area was identified. Multiple biopsies were taken. The left cord was swollen but seemed to be uninvolved. Piriforms appeared to be clear. The post cricoid area appeared clear. Esophagoscopy was difficult and ENT surgeon was unable to pass the scope into the esophagus. He had tracheostomy      04/24/2016 Pathology Results    Larynx, biopsy, Right Side - POSITIVE FOR INVASIVE SQUAMOUS CELL CARCINOMA. - SEE COMMENT. Microscopic Comment As sampled, the squamous cell carcinoma appears well differentiated. A fragment of benign cartilage is also present in the specimen.       05/15/2016 PET scan    Right eccentric glottic mass, appears to cross the anterior commissure with destructive findings of the thyroid cartilage. There is an upper normal size right station 2A lymph node with maximum SUV of 4.2 which is only very faintly elevated over background, but which could possibly represent a small amount of local metastatic spread. 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 2. Asymmetric calcification along the head of the right caudate nucleus and adjacent lentiform nucleus. Well this can sometimes be physiologic, there is some accompanying reduction in PET activity, and the asymmetry is an unusual feature. Accordingly I recommend MRI of the brain with and without contrast for further characterization. 3. A AP window lymph node is minimally enlarged at 1.1 cm, but with a maximum SUV below background mediastinal levels, likely benign.      05/23/2016 Imaging    MRI brain:  Atrophy with chronic microvascular ischemic change. No intracranial metastatic disease. 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        05/26/2016 Surgery    Multiple extraction of tooth numbers 2, 5, 6, 8-13, and  21-28. 2. 4 Quadrants of alveoloplasty. 3. Bilateral mandibular lingual tori reductions 4. Maxillary right, maxillary left, and mandibular left buccal exostoses reductions      06/02/2016 Procedure    Successful placement of a right internal jugular approach power injectable Port-A-Cath. The catheter is ready for immediate use      06/09/2016 -  Chemotherapy    He received induction chemotherapy with taxotere, cisplatin and 5 FU       INTERVAL HISTORY: Please see below for problem oriented charting. He is here accompanied by his significant other He denies side effects from recent treatment No nausea, vomiting or peripheral neuropathy He denies pain. No further hemoptysis Overall, he tolerated cycle 2 of chemotherapy very well He is noticed to have some weight loss since last time I saw him  REVIEW OF SYSTEMS:   Constitutional: Denies fevers, chills  Eyes: Denies blurriness of vision Ears, nose, mouth, throat, and face: Denies mucositis or sore throat Respiratory: Denies cough, dyspnea or wheezes Cardiovascular: Denies palpitation, chest discomfort or lower extremity swelling Gastrointestinal:  Denies nausea, heartburn or change in bowel habits Skin: Denies abnormal skin rashes Lymphatics: Denies new lymphadenopathy or easy bruising Neurological:Denies numbness, tingling or new weaknesses Behavioral/Psych: Mood is stable, no new changes  All other systems were reviewed with the patient and are negative.  I have reviewed the past medical history, past surgical history, social history and family history with the patient and they are unchanged from previous note.  ALLERGIES:  is allergic to no known allergies.  MEDICATIONS:  Current Outpatient Prescriptions  Medication Sig Dispense Refill  . dexamethasone (DECADRON) 4 MG tablet Take 2 tablets (8 mg total) by mouth 2 (two) times daily. Start the day  before Taxotere. Take once the day after, then 2 times a day x 2d. 30 tablet 1  . lidocaine-prilocaine (EMLA) cream Apply to affected area once 30 g 3  . Morphine Sulfate (MORPHINE CONCENTRATE) 10 mg / 0.5 ml concentrated solution Take 0.5 mLs (10 mg total) by mouth every 2 (two) hours as needed for severe pain. 240 mL 0  . nicotine (NICODERM CQ - DOSED IN MG/24 HR) 7 mg/24hr patch Place 1 patch (7 mg total) onto the skin daily. 14 patch 0  . LORazepam (ATIVAN) 0.5 MG tablet Take 1-2 tablets 30 min before MRI and before mask fabrication to help with claustrophobia. (Patient not taking: Reported on 07/18/2016) 4 tablet 0  . ondansetron (ZOFRAN) 8 MG tablet Take 1 tablet (8 mg total) by mouth 2 (two) times daily as needed (Nausea or vomiting). Begin 4 days after chemotherapy. (Patient not taking: Reported on 06/27/2016) 30 tablet 1  . prochlorperazine (COMPAZINE) 10 MG tablet Take 1 tablet (10 mg total) by mouth every 6 (six) hours as needed (Nausea or vomiting). (Patient not taking: Reported on 07/18/2016) 30 tablet 1  . promethazine (PHENERGAN) 25 MG suppository Place 1 suppository (25 mg total) rectally every 6 (six) hours as needed for nausea. (Patient not taking: Reported on 05/15/2016) 12 each 0   No current facility-administered medications for this visit.     PHYSICAL EXAMINATION: ECOG PERFORMANCE STATUS: 1 - Symptomatic but completely ambulatory  Vitals:   07/18/16 1437  BP: 129/82  Pulse: 94  Resp: 18  Temp: 99.1 F (37.3 C)   Filed Weights   07/18/16 1437  Weight: 126 lb 8 oz (57.4 kg)    GENERAL:alert, no distress and comfortable SKIN: skin color, texture, turgor are normal, no rashes or significant lesions EYES: normal, Conjunctiva are pink and non-injected, sclera clear OROPHARYNX:no exudate, no erythema and lips, buccal mucosa, and tongue normal  NECK: Tracheostomy site looks okay LYMPH:  no palpable lymphadenopathy in the cervical, axillary or inguinal LUNGS: clear to  auscultation and percussion with normal breathing effort HEART: regular rate & rhythm and no murmurs and no lower extremity edema ABDOMEN:abdomen soft, non-tender and normal bowel sounds Musculoskeletal:no cyanosis of digits and no clubbing  NEURO: alert & oriented x 3 with fluent speech, no focal motor/sensory deficits  LABORATORY DATA:  I have reviewed the data as listed    Component Value Date/Time   NA 141 07/18/2016 1425   K 4.1 07/18/2016 1425   CL 101 06/02/2016 1237   CO2 26 07/18/2016 1425   GLUCOSE 132 07/18/2016 1425   BUN 6.9 (L) 07/18/2016 1425   CREATININE 0.8 07/18/2016 1425   CALCIUM 9.1 07/18/2016 1425   PROT 6.5 07/18/2016 1425   ALBUMIN 3.1 (L) 07/18/2016 1425   AST 13 07/18/2016 1425   ALT 9 07/18/2016 1425   ALKPHOS 124 07/18/2016 1425   BILITOT 0.26 07/18/2016 1425   GFRNONAA >60 06/02/2016 1237   GFRNONAA 82 03/06/2011 0853   GFRAA >60 06/02/2016 1237   GFRAA >89 03/06/2011 0853    No results found for: SPEP, UPEP  Lab Results  Component Value Date   WBC 13.7 (H) 07/18/2016   NEUTROABS 10.8 (H) 07/18/2016   HGB 10.3 (L) 07/18/2016   HCT 31.1 (L) 07/18/2016   MCV 89.7 07/18/2016   PLT 372 07/18/2016      Chemistry      Component Value Date/Time   NA 141 07/18/2016 1425   K 4.1 07/18/2016 1425   CL 101 06/02/2016 1237   CO2 26 07/18/2016 1425   BUN 6.9 (L) 07/18/2016 1425   CREATININE 0.8 07/18/2016 1425      Component Value Date/Time   CALCIUM 9.1 07/18/2016 1425   ALKPHOS 124 07/18/2016 1425   AST 13 07/18/2016 1425   ALT 9 07/18/2016 1425   BILITOT 0.26 07/18/2016 1425     ASSESSMENT & PLAN:  Malignant neoplasm of supraglottis ([place]) He is recovering from recent side effects of treatment. 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 The patient denies recent history of bleeding such as epistaxis, hematuria or hematochezia. He is asymptomatic from the anemia. I will observe for now.  He does not require transfusion now. I will continue the chemotherapy at current dose without dosage adjustment.  If the anemia gets progressive worse in the future, I might have to delay his treatment or adjust the chemotherapy dose.   Weight loss He has mild weight loss due to recent treatment We discussed importance of nutritional supplement and for him to gain weight  again He appears motivated   Orders Placed This Encounter  Procedures  . NM PET Image Restag (PS) Skull Base To Thigh    Standing Status:   Future    Standing Expiration Date:   09/17/2017    Order Specific Question:   Reason for Exam (SYMPTOM  OR DIAGNOSIS REQUIRED)    Answer:   staging supraglottic ca, assess response to Rx    Order Specific Question:   Preferred imaging location?    Answer:   [facility]   All questions were answered. The patient knows to call the clinic with any problems, questions or concerns. No barriers to learning was detected. I spent 25 minutes counseling the patient face to face. The total time spent in the appointment was 30 minutes and more than 50% was on counseling and review of test results     Heath Lark, MD 07/18/2016 4:40 PM

## 2016-07-18 NOTE — Assessment & Plan Note (Signed)
He has mild weight loss due to recent treatment We discussed importance of nutritional supplement and for him to gain weight again He appears motivated

## 2016-07-18 NOTE — Assessment & Plan Note (Signed)
This is likely due to recent treatment. The patient denies recent history of bleeding such as epistaxis, hematuria or hematochezia. He is asymptomatic from the anemia. I will observe for now.  He does not require transfusion now. I will continue the chemotherapy at current dose without dosage adjustment.  If the anemia gets progressive worse in the future, I might have to delay his treatment or adjust the chemotherapy dose.  

## 2016-07-18 NOTE — Telephone Encounter (Signed)
Appointments scheduled per 07/18/16 los. Patient was given a copy of the AVS report and appointment schedule per 07/18/16 los. °

## 2016-07-18 NOTE — Assessment & Plan Note (Signed)
He is recovering from recent side effects of treatment. We will proceed with cycle 3 of treatment as scheduled I have ordered repeat PET CT scan to be done in 2 weeks, followed by tumor board discussion to determine the timing for radiation treatment I will see him back again in 3 weeks to review test results

## 2016-07-21 ENCOUNTER — Ambulatory Visit: Payer: No Typology Code available for payment source | Admitting: Nutrition

## 2016-07-21 ENCOUNTER — Ambulatory Visit (HOSPITAL_BASED_OUTPATIENT_CLINIC_OR_DEPARTMENT_OTHER): Payer: No Typology Code available for payment source

## 2016-07-21 VITALS — BP 124/86 | HR 86 | Temp 98.2°F | Resp 18

## 2016-07-21 DIAGNOSIS — Z5111 Encounter for antineoplastic chemotherapy: Secondary | ICD-10-CM

## 2016-07-21 DIAGNOSIS — C321 Malignant neoplasm of supraglottis: Secondary | ICD-10-CM

## 2016-07-21 MED ORDER — PALONOSETRON HCL INJECTION 0.25 MG/5ML
INTRAVENOUS | Status: AC
  Filled 2016-07-21: qty 5

## 2016-07-21 MED ORDER — PALONOSETRON HCL INJECTION 0.25 MG/5ML
0.2500 mg | Freq: Once | INTRAVENOUS | Status: AC
  Administered 2016-07-21: 0.25 mg via INTRAVENOUS

## 2016-07-21 MED ORDER — DOCETAXEL CHEMO INJECTION 160 MG/16ML
75.0000 mg/m2 | Freq: Once | INTRAVENOUS | Status: AC
  Administered 2016-07-21: 120 mg via INTRAVENOUS
  Filled 2016-07-21: qty 12

## 2016-07-21 MED ORDER — SODIUM CHLORIDE 0.9 % IV SOLN
Freq: Once | INTRAVENOUS | Status: AC
  Administered 2016-07-21: 08:00:00 via INTRAVENOUS

## 2016-07-21 MED ORDER — SODIUM CHLORIDE 0.9 % IV SOLN
Freq: Once | INTRAVENOUS | Status: AC
  Administered 2016-07-21: 11:00:00 via INTRAVENOUS
  Filled 2016-07-21: qty 5

## 2016-07-21 MED ORDER — MANNITOL 25 % IV SOLN
Freq: Once | INTRAVENOUS | Status: AC
  Administered 2016-07-21: 08:00:00 via INTRAVENOUS
  Filled 2016-07-21: qty 10

## 2016-07-21 MED ORDER — SODIUM CHLORIDE 0.9 % IV SOLN
75.0000 mg/m2 | Freq: Once | INTRAVENOUS | Status: AC
  Administered 2016-07-21: 123 mg via INTRAVENOUS
  Filled 2016-07-21: qty 123

## 2016-07-21 MED ORDER — SODIUM CHLORIDE 0.9 % IV SOLN
750.0000 mg/m2/d | INTRAVENOUS | Status: DC
  Administered 2016-07-21: 4900 mg via INTRAVENOUS
  Filled 2016-07-21: qty 98

## 2016-07-21 NOTE — Progress Notes (Signed)
Nutrition follow-up completed with patient during i induction chemotherapy for supraglottis cancer. Weight decreased and documented as 126.5 pounds April 17, down from 132 pounds March 27. Patient reports he is tolerating treatment but does not eat well after chemotherapy. Reports he is forcing himself to drink extra fluids and enjoys ensure plus. States he will drink 4 bottles of Ensure Plus daily.  Nutrition diagnosis: Inadequate oral intake continues.  Intervention: Recommended patient increase Ensure Plus to 6 bottles daily after chemotherapy. Provided second complimentary case of Ensure Plus. Provided additional coupons. Questions were answered.  Teach back method used.  Monitoring, evaluation, goals: Patient will increase oral intake to minimize weight loss.  Next visit: To be scheduled.  **Disclaimer: This note was dictated with voice recognition software. Similar sounding words can inadvertently be transcribed and this note may contain transcription errors which may not have been corrected upon publication of note.**

## 2016-07-21 NOTE — Patient Instructions (Signed)
[place] Discharge Instructions for Patients Receiving Chemotherapy  Today you received the following chemotherapy agents: Taxotere, Cisplatin, Flurouracil  To help prevent nausea and vomiting after your treatment, we encourage you to take your nausea medication as directed.    If you develop nausea and vomiting that is not controlled by your nausea medication, call the clinic.   BELOW ARE SYMPTOMS THAT SHOULD BE REPORTED IMMEDIATELY:  *FEVER GREATER THAN 100.5 F  *CHILLS WITH OR WITHOUT FEVER  NAUSEA AND VOMITING THAT IS NOT CONTROLLED WITH YOUR NAUSEA MEDICATION  *UNUSUAL SHORTNESS OF BREATH  *UNUSUAL BRUISING OR BLEEDING  TENDERNESS IN MOUTH AND THROAT WITH OR WITHOUT PRESENCE OF ULCERS  *URINARY PROBLEMS  *BOWEL PROBLEMS  UNUSUAL RASH Items with * indicate a potential emergency and should be followed up as soon as possible.  Feel free to call the clinic you have any questions or concerns. The clinic phone number is [phone].  Please show the [place] at check-in to the Emergency Department and triage nurse.

## 2016-07-23 NOTE — Progress Notes (Signed)
Oncology Nurse Navigator Documentation  Met with Mr. Craighead during Established Patient appt with Dr. Alvy Bimler.  He was accompanied by his wife. Lab work good, 3rd cycle chemo starts Friday, 4/20. He reports minimal throat pain, some nausea s/p last cycle chemo. He understands he is to have restaging PET 5/1, will proceed with 4th cycle is PET indicates response. They understand to contact me with needs/concerns.  [name], RN, BSN, [place] [facility] On[facility] Nurse [place] [facility] [place] [phone]

## 2016-07-24 ENCOUNTER — Telehealth: Payer: Self-pay | Admitting: *Deleted

## 2016-07-24 NOTE — Telephone Encounter (Signed)
"  My husbands pump disconnect appointment was changed to 3:30 pm.  I have an appointment at 3:30 in [facility].  He has to come earlier or after 4:30 pm.  We know how to shut it off when it empties.  We've had to do this before."     Message sent to infusion room scheduler and time indicated on the pump d/c flow sheet for scheduler to change appointment to 5:00 pm.

## 2016-07-25 ENCOUNTER — Ambulatory Visit (HOSPITAL_BASED_OUTPATIENT_CLINIC_OR_DEPARTMENT_OTHER): Payer: No Typology Code available for payment source

## 2016-07-25 VITALS — BP 136/94 | HR 94 | Temp 99.6°F | Resp 20

## 2016-07-25 DIAGNOSIS — Z5189 Encounter for other specified aftercare: Secondary | ICD-10-CM

## 2016-07-25 DIAGNOSIS — C321 Malignant neoplasm of supraglottis: Secondary | ICD-10-CM

## 2016-07-25 DIAGNOSIS — Z452 Encounter for adjustment and management of vascular access device: Secondary | ICD-10-CM

## 2016-07-25 MED ORDER — HEPARIN SOD (PORK) LOCK FLUSH 100 UNIT/ML IV SOLN
500.0000 [IU] | Freq: Once | INTRAVENOUS | Status: AC | PRN
  Administered 2016-07-25: 500 [IU]
  Filled 2016-07-25: qty 5

## 2016-07-25 MED ORDER — SODIUM CHLORIDE 0.9% FLUSH
10.0000 mL | INTRAVENOUS | Status: DC | PRN
  Administered 2016-07-25: 10 mL
  Filled 2016-07-25: qty 10

## 2016-07-25 MED ORDER — PEGFILGRASTIM INJECTION 6 MG/0.6ML ~~LOC~~
6.0000 mg | PREFILLED_SYRINGE | Freq: Once | SUBCUTANEOUS | Status: AC
  Administered 2016-07-25: 6 mg via SUBCUTANEOUS
  Filled 2016-07-25: qty 0.6

## 2016-07-25 NOTE — Patient Instructions (Signed)
Central Line, Adult A central line is a thin, flexible tube (catheter) that is put in your vein. It can be used to:  Take blood for lab tests.  Give you medicine.  Give you food and nutrients.  The procedure may vary among doctors and hospitals. Follow these instructions at home: Caring for the tube  Follow instructions from your doctor about: ? Flushing the tube with saline solution. ? Cleaning the tube and the area around it.  Only flush with clean (sterile) supplies. The supplies should be from your doctor, a pharmacy, or another place that your doctor recommends.  Before you flush the tube or clean the area around the tube: ? Wash your hands with soap and water. If you cannot use soap and water, use hand sanitizer. ? Clean the central line hub with rubbing alcohol. Caring for your skin  Keep the area where the tube was put in clean and dry.  Every day, and when changing the bandage, check the skin around the central line for: ? Redness, swelling, or pain. ? Fluid or blood. ? Warmth. ? Pus. ? A bad smell. General instructions  Keep the tube clamped, unless it is being used.  Keep your supplies in a clean, dry location.  If you or someone else accidentally pulls on the tube, make sure: ? The bandage (dressing) is okay. ? There is no bleeding. ? The tube has not been pulled out.  Do not use scissors or sharp objects near the tube.  Do not swim or let the tube soak in a tub.  Ask your doctor what activities are safe for you. Your doctor may tell you not to lift anything or move your arm too much.  Take over-the-counter and prescription medicines only as told by your doctor.  Change bandages as told by your doctor.  Keep your bandage dry. If a bandage gets wet, have it changed right away.  Keep all follow-up visits as told by your doctor. This is important. Throwing away supplies  Throw away any syringes in a trash (disposal) container that is only for sharp  items (sharps container). You can buy a sharps container from a pharmacy, or you can make one by using an empty hard plastic bottle with a cover.  Place any used bandages or infusion bags into a plastic bag. Throw that bag in the trash. Contact a doctor if:  You have any of these where the tube was put in: ? Redness, swelling, or pain. ? Fluid or blood. ? A warm feeling. ? Pus or a bad smell. Get help right away if:  You have: ? A fever. ? Chills. ? Trouble getting enough air (shortness of breath). ? Trouble breathing. ? Pain in your chest. ? Swelling in your neck, face, chest, or arm.  You are coughing.  You feel your heart beating fast or skipping beats.  You feel dizzy or you pass out (faint).  There are red lines coming from where the tube was put in.  The area where the tube was put in is bleeding and the bleeding will not stop.  Your tube is hard to flush.  You do not get a blood return from the tube.  The tube gets loose or comes out.  The tube has a hole or a tear.  The tube leaks. Summary  A central line is a thin, flexible tube (catheter) that is put in your vein. It can be used to take blood for lab tests or to   give you medicine.  Follow instructions from your doctor about flushing and cleaning the tube.  Keep the area where the tube was put in clean and dry.  Ask your doctor what activities are safe for you. This information is not intended to replace advice given to you by your health care provider. Make sure you discuss any questions you have with your health care provider. Document Released: 03/06/2012 Document Revised: 04/06/2016 Document Reviewed: 04/06/2016 Elsevier Interactive Patient Education  2017 [employer]   Pegfilgrastim injection What is this medicine? PEGFILGRASTIM (PEG fil gra stim) is a long-acting granulocyte colony-stimulating factor that stimulates the growth of neutrophils, a type of white blood cell important in the body's  fight against infection. It is used to reduce the incidence of fever and infection in patients with certain types of cancer who are receiving chemotherapy that affects the bone marrow, and to increase survival after being exposed to high doses of radiation. This medicine may be used for other purposes; ask your health care provider or pharmacist if you have questions. COMMON BRAND NAME(S): Neulasta What should I tell my health care provider before I take this medicine? They need to know if you have any of these conditions: -kidney disease -latex allergy -ongoing radiation therapy -sickle cell disease -skin reactions to acrylic adhesives (On-Body Injector only) -an unusual or allergic reaction to pegfilgrastim, filgrastim, other medicines, foods, dyes, or preservatives -pregnant or trying to get pregnant -breast-feeding How should I use this medicine? This medicine is for injection under the skin. If you get this medicine at home, you will be taught how to prepare and give the pre-filled syringe or how to use the On-body Injector. Refer to the patient Instructions for Use for detailed instructions. Use exactly as directed. Tell your healthcare provider immediately if you suspect that the On-body Injector may not have performed as intended or if you suspect the use of the On-body Injector resulted in a missed or partial dose. It is important that you put your used needles and syringes in a special sharps container. Do not put them in a trash can. If you do not have a sharps container, call your pharmacist or healthcare provider to get one. Talk to your pediatrician regarding the use of this medicine in children. While this drug may be prescribed for selected conditions, precautions do apply. Overdosage: If you think you have taken too much of this medicine contact a poison control center or emergency room at once. NOTE: This medicine is only for you. Do not share this medicine with others. What if I  miss a dose? It is important not to miss your dose. Call your doctor or health care professional if you miss your dose. If you miss a dose due to an On-body Injector failure or leakage, a new dose should be administered as soon as possible using a single prefilled syringe for manual use. What may interact with this medicine? Interactions have not been studied. Give your health care provider a list of all the medicines, herbs, non-prescription drugs, or dietary supplements you use. Also tell them if you smoke, drink alcohol, or use illegal drugs. Some items may interact with your medicine. This list may not describe all possible interactions. Give your health care provider a list of all the medicines, herbs, non-prescription drugs, or dietary supplements you use. Also tell them if you smoke, drink alcohol, or use illegal drugs. Some items may interact with your medicine. What should I watch for while using this medicine?   You may need blood work done while you are taking this medicine. If you are going to need a MRI, CT scan, or other procedure, tell your doctor that you are using this medicine (On-Body Injector only). What side effects may I notice from receiving this medicine? Side effects that you should report to your doctor or health care professional as soon as possible: -allergic reactions like skin rash, itching or hives, swelling of the face, lips, or tongue -dizziness -fever -pain, redness, or irritation at site where injected -pinpoint red spots on the skin -red or dark-Pedrosa urine -shortness of breath or breathing problems -stomach or side pain, or pain at the shoulder -swelling -tiredness -trouble passing urine or change in the amount of urine Side effects that usually do not require medical attention (report to your doctor or health care professional if they continue or are bothersome): -bone pain -muscle pain This list may not describe all possible side effects. Call your doctor  for medical advice about side effects. You may report side effects to FDA at 1-800-FDA-1088. Where should I keep my medicine? Keep out of the reach of children. Store pre-filled syringes in a refrigerator between 2 and 8 degrees C (36 and 46 degrees F). Do not freeze. Keep in carton to protect from light. Throw away this medicine if it is left out of the refrigerator for more than 48 hours. Throw away any unused medicine after the expiration date. NOTE: This sheet is a summary. It may not cover all possible information. If you have questions about this medicine, talk to your doctor, pharmacist, or health care provider.  2018 Elsevier/Gold Standard (2016-03-16 12:58:03)  

## 2016-07-27 ENCOUNTER — Telehealth: Payer: Self-pay

## 2016-07-27 NOTE — Telephone Encounter (Signed)
-----   Message from Loletta Parish sent at 07/27/2016 10:30 AM EDT ----- Regarding: RE: PET scan not scheduled PET has been authorized.  Thanks  ----- Message ----- From: Heath Lark, MD Sent: 07/26/2016   8:34 AM To: Patton Salles, RN, Flo Shanks, RN, # Subject: PET scan not scheduled                         FYI: it needs to be done before I see him back

## 2016-07-27 NOTE — Telephone Encounter (Signed)
Called scheduler in radiology, they will call and schedule PET scan.

## 2016-07-31 ENCOUNTER — Encounter (HOSPITAL_COMMUNITY)
Admission: RE | Admit: 2016-07-31 | Discharge: 2016-07-31 | Disposition: A | Discharge status: Home / Self Care | Payer: Medicaid Other | Source: Ambulatory Visit | Attending: Hematology and Oncology | Admitting: Hematology and Oncology

## 2016-07-31 DIAGNOSIS — C321 Malignant neoplasm of supraglottis: Secondary | ICD-10-CM

## 2016-07-31 LAB — GLUCOSE, CAPILLARY: GLUCOSE-CAPILLARY: 119 mg/dL — AB (ref 65–99)

## 2016-07-31 MED ORDER — FLUDEOXYGLUCOSE F - 18 (FDG) INJECTION
6.2600 | Freq: Once | INTRAVENOUS | Status: AC | PRN
  Administered 2016-07-31: 6.26 via INTRAVENOUS

## 2016-08-01 DIAGNOSIS — Z930 Tracheostomy status: Secondary | ICD-10-CM | POA: Diagnosis not present

## 2016-08-03 ENCOUNTER — Telehealth: Payer: Self-pay | Admitting: Hematology and Oncology

## 2016-08-03 ENCOUNTER — Other Ambulatory Visit: Payer: Self-pay | Admitting: Hematology and Oncology

## 2016-08-03 NOTE — Telephone Encounter (Signed)
Review results of PET CT scan with the patient over the telephone The plan would be for 1 more cycle of chemotherapy to be scheduled around 08/18/2016 followed by radiation treatment afterwards I have sent scheduling message to see him back

## 2016-08-04 ENCOUNTER — Ambulatory Visit: Admission: RE | Admit: 2016-08-04 | Payer: Medicaid Other | Source: Ambulatory Visit | Admitting: Radiation Oncology

## 2016-08-07 ENCOUNTER — Ambulatory Visit: Admission: RE | Admit: 2016-08-07 | Payer: Medicaid Other | Source: Ambulatory Visit | Admitting: Radiation Oncology

## 2016-08-07 ENCOUNTER — Telehealth: Payer: Self-pay | Admitting: Internal Medicine

## 2016-08-07 NOTE — Telephone Encounter (Signed)
CALLED PATIENT TIME TO RENEW GCCN, HE STATED HE WAS AWARDED MEDICAID IN APRIL 2018,

## 2016-08-08 ENCOUNTER — Other Ambulatory Visit: Payer: Self-pay

## 2016-08-08 ENCOUNTER — Telehealth: Payer: Self-pay | Admitting: Hematology and Oncology

## 2016-08-08 ENCOUNTER — Ambulatory Visit: Payer: Self-pay | Admitting: Hematology and Oncology

## 2016-08-08 NOTE — Telephone Encounter (Signed)
lvm to inform pt of upcoming appt date/times per sch msg

## 2016-08-15 ENCOUNTER — Encounter: Payer: Self-pay | Admitting: Hematology and Oncology

## 2016-08-15 ENCOUNTER — Other Ambulatory Visit (HOSPITAL_BASED_OUTPATIENT_CLINIC_OR_DEPARTMENT_OTHER): Payer: No Typology Code available for payment source

## 2016-08-15 ENCOUNTER — Ambulatory Visit (HOSPITAL_BASED_OUTPATIENT_CLINIC_OR_DEPARTMENT_OTHER): Payer: No Typology Code available for payment source | Admitting: Hematology and Oncology

## 2016-08-15 DIAGNOSIS — C321 Malignant neoplasm of supraglottis: Secondary | ICD-10-CM

## 2016-08-15 DIAGNOSIS — D6481 Anemia due to antineoplastic chemotherapy: Secondary | ICD-10-CM

## 2016-08-15 DIAGNOSIS — R634 Abnormal weight loss: Secondary | ICD-10-CM

## 2016-08-15 DIAGNOSIS — T451X5A Adverse effect of antineoplastic and immunosuppressive drugs, initial encounter: Secondary | ICD-10-CM

## 2016-08-15 DIAGNOSIS — Z930 Tracheostomy status: Secondary | ICD-10-CM

## 2016-08-15 DIAGNOSIS — G893 Neoplasm related pain (acute) (chronic): Secondary | ICD-10-CM

## 2016-08-15 LAB — CBC WITH DIFFERENTIAL/PLATELET
BASO%: 0.7 % (ref 0.0–2.0)
Basophils Absolute: 0.1 10*3/uL (ref 0.0–0.1)
EOS%: 2.2 % (ref 0.0–7.0)
Eosinophils Absolute: 0.2 10*3/uL (ref 0.0–0.5)
HCT: 28.6 % — ABNORMAL LOW (ref 38.4–49.9)
HGB: 9.6 g/dL — ABNORMAL LOW (ref 13.0–17.1)
LYMPH%: 18.5 % (ref 14.0–49.0)
MCH: 30.2 pg (ref 27.2–33.4)
MCHC: 33.7 g/dL (ref 32.0–36.0)
MCV: 89.7 fL (ref 79.3–98.0)
MONO#: 1.3 10*3/uL — ABNORMAL HIGH (ref 0.1–0.9)
MONO%: 14.2 % — ABNORMAL HIGH (ref 0.0–14.0)
NEUT#: 6 10*3/uL (ref 1.5–6.5)
NEUT%: 64.4 % (ref 39.0–75.0)
Platelets: 472 10*3/uL — ABNORMAL HIGH (ref 140–400)
RBC: 3.19 10*6/uL — ABNORMAL LOW (ref 4.20–5.82)
RDW: 19.2 % — ABNORMAL HIGH (ref 11.0–14.6)
WBC: 9.3 10*3/uL (ref 4.0–10.3)
lymph#: 1.7 10*3/uL (ref 0.9–3.3)

## 2016-08-15 LAB — COMPREHENSIVE METABOLIC PANEL
ALT: 8 U/L (ref 0–55)
AST: 11 U/L (ref 5–34)
Albumin: 3.2 g/dL — ABNORMAL LOW (ref 3.5–5.0)
Alkaline Phosphatase: 105 U/L (ref 40–150)
Anion Gap: 11 mEq/L (ref 3–11)
BUN: 9.2 mg/dL (ref 7.0–26.0)
CO2: 27 mEq/L (ref 22–29)
Calcium: 9.5 mg/dL (ref 8.4–10.4)
Chloride: 102 mEq/L (ref 98–109)
Creatinine: 0.8 mg/dL (ref 0.7–1.3)
EGFR: >90 mL/min/{1.73_m2} (ref >90)
Glucose: 88 mg/dl (ref 70–140)
Potassium: 5 mEq/L (ref 3.5–5.1)
Sodium: 141 mEq/L (ref 136–145)
Total Bilirubin: <0.22 mg/dL (ref 0.20–1.20)
Total Protein: 6.8 g/dL (ref 6.4–8.3)

## 2016-08-15 LAB — MAGNESIUM: MAGNESIUM: 2 mg/dL (ref 1.5–2.5)

## 2016-08-15 MED ORDER — LORAZEPAM 0.5 MG PO TABS
ORAL_TABLET | ORAL | 0 refills | Status: DC
Start: 2016-08-15 — End: 2016-09-11

## 2016-08-15 MED ORDER — DEXAMETHASONE 4 MG PO TABS: 8.0000 mg | ORAL_TABLET | Freq: Two times a day (BID) | ORAL | 1 refills | Status: DC

## 2016-08-15 MED ORDER — ONDANSETRON HCL 8 MG PO TABS: 8.0000 mg | ORAL_TABLET | Freq: Two times a day (BID) | ORAL | 1 refills | Status: DC | PRN

## 2016-08-15 MED ORDER — MORPHINE SULFATE (CONCENTRATE) 10 MG /0.5 ML PO SOLN: 10.0000 mg | ORAL | 0 refills | Status: DC | PRN

## 2016-08-15 MED ORDER — PROCHLORPERAZINE MALEATE 10 MG PO TABS: 10.0000 mg | ORAL_TABLET | Freq: Four times a day (QID) | ORAL | 1 refills | Status: DC | PRN

## 2016-08-15 MED FILL — PROCHLORPERAZINE 10 MG TAB: 10 | 7 days supply | Qty: 30 | Fill #0

## 2016-08-15 MED FILL — DEXAMETHASONE 4 MG TABLET: 4 | 5 days supply | Qty: 20 | Fill #0

## 2016-08-15 MED FILL — LORazepam 0.5 MG TABS: 0.5 | 2 days supply | Qty: 4 | Fill #0

## 2016-08-15 MED FILL — MORPHINE SULF 100 MG/5 ML S: 100 | 40 days supply | Qty: 240 | Fill #0

## 2016-08-15 NOTE — Progress Notes (Signed)
[place] OFFICE PROGRESS NOTE  Patient Care Team: Velna Ochs, MD as PCP - General Leota Sauers, RN as Oncology Nurse Navigator Eppie Gibson, MD as Attending Physician (Radiation Oncology) Karie Mainland, RD as Dietitian (Nutrition)  SUMMARY OF ONCOLOGIC HISTORY:   Malignant neoplasm of supraglottis ([place])   04/17/2016 Imaging    Ct neck showed relatively large approximately 3.5 cm laryngeal mass most compatible with laryngeal carcinoma. Epicenter at the glottis on the right. Extension across midline as well as involvement of laryngeal cartilages (possibly also bilateral). 2. Relatively small but malignant appearing right level 2 lymphadenopathy. Suspicious contralateral left level 2 lymph nodes.      04/21/2016 Initial Diagnosis    He saw Dr. Constance Holster in the clinic for chronic hoarseness. In the office, it was noted that the posterior soft palate, uvula, tongue base and vallecula were visualized and appeared healthy without mucosal masses or lesions. There is a large mass involving the right side of the larynx, the right cord is fixed. There may be involvement of the post cricoid area it is difficult to say for sure. 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 Clinically this is at least staged as T3. The airway is significantly impaired. He is at risk for impending airway obstruction. ENT recommended him for awake tracheostomy with direct laryngoscopy and biopsy and esophagoscopy under anesthesia immediately following that.       04/24/2016 Pathology Results    Larynx, biopsy, Right Side - POSITIVE FOR INVASIVE SQUAMOUS CELL CARCINOMA. - SEE COMMENT. Microscopic Comment As sampled, the squamous cell carcinoma appears well differentiated. A fragment of benign cartilage is also present in the specimen.      04/24/2016 Surgery    She underwent direct laryngoscopy with biopsy. An anterior commissure scope was used to evaluate  the larynx. A large mass and, using the right side of the larynx and the anterior commissure area was identified. Multiple biopsies were taken. The left cord was swollen but seemed to be uninvolved. Piriforms appeared to be clear. The post cricoid area appeared clear. Esophagoscopy was difficult and ENT surgeon was unable to pass the scope into the esophagus. He had tracheostomy      04/24/2016 Pathology Results    Larynx, biopsy, Right Side - POSITIVE FOR INVASIVE SQUAMOUS CELL CARCINOMA. - SEE COMMENT. Microscopic Comment As sampled, the squamous cell carcinoma appears well differentiated. A fragment of benign cartilage is also present in the specimen.       05/15/2016 PET scan    Right eccentric glottic mass, appears to cross the anterior commissure with destructive findings of the thyroid cartilage. There is an upper normal size right station 2A lymph node with maximum SUV of 4.2 which is only very faintly elevated over background, but which could possibly represent a small amount of local metastatic spread. 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 2. Asymmetric calcification along the head of the right caudate nucleus and adjacent lentiform nucleus. Well this can sometimes be physiologic, there is some accompanying reduction in PET activity, and the asymmetry is an unusual feature. Accordingly I recommend MRI of the brain with and without contrast for further characterization. 3. A AP window lymph node is minimally enlarged at 1.1 cm, but with a maximum SUV below background mediastinal levels, likely benign.      05/23/2016 Imaging    MRI brain:  Atrophy with chronic microvascular ischemic change. No intracranial metastatic disease. 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        05/26/2016 Surgery    Multiple extraction of tooth numbers 2, 5, 6, 8-13, and  21-28. 2. 4 Quadrants of alveoloplasty. 3. Bilateral mandibular lingual tori reductions 4. Maxillary right, maxillary left, and mandibular left buccal exostoses reductions      06/02/2016 Procedure    Successful placement of a right internal jugular approach power injectable Port-A-Cath. The catheter is ready for immediate use      06/09/2016 -  Chemotherapy    He received induction chemotherapy with taxotere, cisplatin and 5 FU      07/31/2016 PET scan    Mild asymmetric soft tissue prominence along the right supraglottic larynx, without hypermetabolism, likely corresponding to treated tumor. No findings suspicious for metastatic disease. Small mediastinal and left axillary nodes, without associated hypermetabolism.       INTERVAL HISTORY: Please see below for problem oriented charting. He is seen today with his significant other for final cycle of treatment. He has lost some weight recently due to unpleasant side effects with last cycle of treatment He has some diarrhea He denies recent smoking No excessive nausea vomiting Denies peripheral neuropathy He has intermittent pain around the tracheostomy site and he uses morphine sulfate as needed  REVIEW OF SYSTEMS:   Constitutional: Denies fevers, chills  Eyes: Denies blurriness of vision Ears, nose, mouth, throat, and face: Denies mucositis or sore throat Respiratory: Denies cough, dyspnea or wheezes Cardiovascular: Denies palpitation, chest discomfort or lower extremity swelling Skin: Denies abnormal skin rashes Lymphatics: Denies new lymphadenopathy or easy bruising Neurological:Denies numbness, tingling or new weaknesses Behavioral/Psych: Mood is stable, no new changes  All other systems were reviewed with the patient and are negative.  I have reviewed the past medical history, past surgical history, social history and family history with the  patient and they are unchanged from previous note.  ALLERGIES:  is allergic to no known allergies.  MEDICATIONS:  Current Outpatient Prescriptions  Medication Sig Dispense Refill  . dexamethasone (DECADRON) 4 MG tablet Take 2 tablets (8 mg total) by mouth 2 (two) times daily. Start the day before Taxotere. Take once the day after, then 2 times a day x 2d. 20 tablet 1  . lidocaine-prilocaine (EMLA) cream Apply to affected area once 30 g 3  . LORazepam (ATIVAN) 0.5 MG tablet Take 1-2 tablets 30 min before MRI and before mask fabrication to help with claustrophobia. 4 tablet 0  . Morphine Sulfate (MORPHINE CONCENTRATE) 10 mg / 0.5 ml concentrated solution Take 0.5 mLs (10 mg total) by mouth every 2 (two) hours as needed for severe pain. 240 mL 0  . ondansetron (ZOFRAN) 8 MG tablet Take 1 tablet (8 mg total) by mouth 2 (two) times daily as needed (Nausea or vomiting). Begin 4 days after chemotherapy. 30 tablet 1  . prochlorperazine (COMPAZINE) 10 MG tablet Take 1 tablet (10 mg total) by mouth every 6 (six) hours as needed (Nausea or vomiting). 30 tablet 1   No current facility-administered medications for this visit.     PHYSICAL EXAMINATION: ECOG PERFORMANCE STATUS: 1 - Symptomatic but completely ambulatory  Vitals:   08/15/16 1346  BP: 108/73  Pulse: 89  Resp: 18  Temp: 98.8 F (37.1 C)   Filed Weights   08/15/16 1346  Weight: 124 lb 1.6  oz (56.3 kg)    GENERAL:alert, no distress and comfortable SKIN: skin color, texture, turgor are normal, no rashes or significant lesions EYES: normal, Conjunctiva are pink and non-injected, sclera clear OROPHARYNX:no exudate, no erythema and lips, buccal mucosa, and tongue normal  NECK: Tracheostomy site looks okay LYMPH:  no palpable lymphadenopathy in the cervical, axillary or inguinal LUNGS: clear to auscultation and percussion with normal breathing effort HEART: regular rate & rhythm and no murmurs and no lower extremity  edema ABDOMEN:abdomen soft, non-tender and normal bowel sounds Musculoskeletal:no cyanosis of digits and no clubbing  NEURO: alert & oriented x 3 with fluent speech, no focal motor/sensory deficits  LABORATORY DATA:  I have reviewed the data as listed    Component Value Date/Time   NA 141 08/15/2016 1330   K 5.0 08/15/2016 1330   CL 101 06/02/2016 1237   CO2 27 08/15/2016 1330   GLUCOSE 88 08/15/2016 1330   BUN 9.2 08/15/2016 1330   CREATININE 0.8 08/15/2016 1330   CALCIUM 9.5 08/15/2016 1330   PROT 6.8 08/15/2016 1330   ALBUMIN 3.2 (L) 08/15/2016 1330   AST 11 08/15/2016 1330   ALT 8 08/15/2016 1330   ALKPHOS 105 08/15/2016 1330   BILITOT <0.22 08/15/2016 1330   GFRNONAA >60 06/02/2016 1237   GFRNONAA 82 03/06/2011 0853   GFRAA >60 06/02/2016 1237   GFRAA >89 03/06/2011 0853    No results found for: SPEP, UPEP  Lab Results  Component Value Date   WBC 9.3 08/15/2016   NEUTROABS 6.0 08/15/2016   HGB 9.6 (L) 08/15/2016   HCT 28.6 (L) 08/15/2016   MCV 89.7 08/15/2016   PLT 472 (H) 08/15/2016      Chemistry      Component Value Date/Time   NA 141 08/15/2016 1330   K 5.0 08/15/2016 1330   CL 101 06/02/2016 1237   CO2 27 08/15/2016 1330   BUN 9.2 08/15/2016 1330   CREATININE 0.8 08/15/2016 1330      Component Value Date/Time   CALCIUM 9.5 08/15/2016 1330   ALKPHOS 105 08/15/2016 1330   AST 11 08/15/2016 1330   ALT 8 08/15/2016 1330   BILITOT <0.22 08/15/2016 1330       RADIOGRAPHIC STUDIES: I have personally reviewed the radiological images as listed and agreed with the findings in the report. Nm Pet Image Restag (ps) Skull Base To Thigh  Result Date: 07/31/2016 CLINICAL DATA:  Subsequent treatment strategy for supraglottic cancer. EXAM: NUCLEAR MEDICINE PET SKULL BASE TO THIGH TECHNIQUE: 6.26 mCi F-18 FDG was injected intravenously. Full-ring PET imaging was performed from the skull base to thigh after the radiotracer. CT data was obtained and used for  attenuation correction and anatomic localization. FASTING BLOOD GLUCOSE:  Value: 119 mg/dl COMPARISON:  PET-CT dated 05/15/2016 FINDINGS: NECK No suspicious hypermetabolic cervical lymphadenopathy. Mild asymmetric soft tissue prominence along the right supraglottic larynx (series 4/image 16), without associated hypermetabolism, likely corresponding to treated tumor. CHEST Tracheostomy in satisfactory position. No suspicious pulmonary nodules on the CT scan. 8 mm short axis AP window node (series 4/ image 37) and a 5 mm short axis left axillary node (series 4/ image 36), without associated hypermetabolism. Right chest port terminates at the cavoatrial junction. Mild coronary atherosclerosis of the LAD and right coronary artery. ABDOMEN/PELVIS No abnormal hypermetabolic activity within the liver, pancreas, adrenal glands, or spleen. No hypermetabolic lymph nodes in the abdomen or pelvis. SKELETON No focal hypermetabolic activity to suggest skeletal metastasis. Diffuse hypermetabolism in the visualized axial  and appendicular skeleton, favored to be reactive/reflect marrow stimulation. Representative max SUV of a mid thoracic vertebral body is 8.6. IMPRESSION: Mild asymmetric soft tissue prominence along the right supraglottic larynx, without hypermetabolism, likely corresponding to treated tumor. No findings suspicious for metastatic disease. Small mediastinal and left axillary nodes, without associated hypermetabolism. Electronically Signed   By: Julian Hy M.D.   On: 07/31/2016 12:33    ASSESSMENT & PLAN:  Malignant neoplasm of supraglottis ([place]) I review his PET CT scan with the patient. We discussed recent ENT tumor board findings I recommend we proceed with 1 more cycle of treatment before radiation treatment Given his recent side effects with diarrhea and fatigue, I recommend reduce the dose of 5-FU little bit I plan to see him back in 2-3 weeks time after treatment for supportive care  Anemia  due to antineoplastic chemotherapy This is likely due to recent treatment. The patient denies recent history of bleeding such as epistaxis, hematuria or hematochezia. He is asymptomatic from the anemia. I will observe for now.   I plan to reduce a dose of chemotherapy as above.  Status post tracheostomy [facility]) The tracheostomy site looks good without signs of bleeding or infection  Cancer associated pain This is less, likely due to positive response to chemotherapy.  He will continue to take pain medicine as needed  Weight loss He has recent weight loss due to side effects of treatment As above, I will reduce the dose of 5-FU a little bit for final cycle of treatment   No orders of the defined types were placed in this encounter.  All questions were answered. The patient knows to call the clinic with any problems, questions or concerns. No barriers to learning was detected. I spent 20 minutes counseling the patient face to face. The total time spent in the appointment was 30 minutes and more than 50% was on counseling and review of test results     Heath Lark, MD 08/15/2016 5:45 PM

## 2016-08-15 NOTE — Assessment & Plan Note (Signed)
This is likely due to recent treatment. The patient denies recent history of bleeding such as epistaxis, hematuria or hematochezia. He is asymptomatic from the anemia. I will observe for now.   I plan to reduce a dose of chemotherapy as above.

## 2016-08-15 NOTE — Assessment & Plan Note (Signed)
He has recent weight loss due to side effects of treatment As above, I will reduce the dose of 5-FU a little bit for final cycle of treatment

## 2016-08-15 NOTE — Assessment & Plan Note (Signed)
The tracheostomy site looks good without signs of bleeding or infection

## 2016-08-15 NOTE — Assessment & Plan Note (Signed)
I review his PET CT scan with the patient. We discussed recent ENT tumor board findings I recommend we proceed with 1 more cycle of treatment before radiation treatment Given his recent side effects with diarrhea and fatigue, I recommend reduce the dose of 5-FU little bit I plan to see him back in 2-3 weeks time after treatment for supportive care

## 2016-08-15 NOTE — Assessment & Plan Note (Signed)
This is less, likely due to positive response to chemotherapy.  He will continue to take pain medicine as needed

## 2016-08-17 NOTE — Progress Notes (Signed)
error 

## 2016-08-18 ENCOUNTER — Ambulatory Visit: Payer: No Typology Code available for payment source | Admitting: Nutrition

## 2016-08-18 ENCOUNTER — Ambulatory Visit (HOSPITAL_BASED_OUTPATIENT_CLINIC_OR_DEPARTMENT_OTHER): Payer: No Typology Code available for payment source

## 2016-08-18 VITALS — BP 127/88 | HR 90 | Temp 98.6°F | Resp 20

## 2016-08-18 DIAGNOSIS — C321 Malignant neoplasm of supraglottis: Secondary | ICD-10-CM

## 2016-08-18 DIAGNOSIS — Z5111 Encounter for antineoplastic chemotherapy: Secondary | ICD-10-CM

## 2016-08-18 MED ORDER — PALONOSETRON HCL INJECTION 0.25 MG/5ML
0.2500 mg | Freq: Once | INTRAVENOUS | Status: AC
  Administered 2016-08-18: 0.25 mg via INTRAVENOUS

## 2016-08-18 MED ORDER — POTASSIUM CHLORIDE 2 MEQ/ML IV SOLN
Freq: Once | INTRAVENOUS | Status: AC
  Administered 2016-08-18: 10:00:00 via INTRAVENOUS
  Filled 2016-08-18: qty 10

## 2016-08-18 MED ORDER — SODIUM CHLORIDE 0.9 % IV SOLN
Freq: Once | INTRAVENOUS | Status: AC
  Administered 2016-08-18: 09:00:00 via INTRAVENOUS

## 2016-08-18 MED ORDER — SODIUM CHLORIDE 0.9 % IV SOLN
600.0000 mg/m2/d | INTRAVENOUS | Status: DC
  Administered 2016-08-18: 3950 mg via INTRAVENOUS
  Filled 2016-08-18: qty 79

## 2016-08-18 MED ORDER — SODIUM CHLORIDE 0.9 % IV SOLN
Freq: Once | INTRAVENOUS | Status: AC
  Administered 2016-08-18: 12:00:00 via INTRAVENOUS
  Filled 2016-08-18: qty 5

## 2016-08-18 MED ORDER — CISPLATIN CHEMO INJECTION 100MG/100ML
75.0000 mg/m2 | Freq: Once | INTRAVENOUS | Status: AC
  Administered 2016-08-18: 123 mg via INTRAVENOUS
  Filled 2016-08-18: qty 123

## 2016-08-18 MED ORDER — DEXTROSE 5 % IV SOLN
75.0000 mg/m2 | Freq: Once | INTRAVENOUS | Status: AC
  Administered 2016-08-18: 120 mg via INTRAVENOUS
  Filled 2016-08-18: qty 12

## 2016-08-18 MED ORDER — PALONOSETRON HCL INJECTION 0.25 MG/5ML
INTRAVENOUS | Status: AC
  Filled 2016-08-18: qty 5

## 2016-08-18 NOTE — Patient Instructions (Signed)
[place] [facility] Discharge Instructions for Patients Receiving Chemotherapy  Today you received the following chemotherapy agents: Taxotere, Cisplatin, Flurouracil  To help prevent nausea and vomiting after your treatment, we encourage you to take your nausea medication as directed.    If you develop nausea and vomiting that is not controlled by your nausea medication, call the clinic.   BELOW ARE SYMPTOMS THAT SHOULD BE REPORTED IMMEDIATELY:  *FEVER GREATER THAN 100.5 F  *CHILLS WITH OR WITHOUT FEVER  NAUSEA AND VOMITING THAT IS NOT CONTROLLED WITH YOUR NAUSEA MEDICATION  *UNUSUAL SHORTNESS OF BREATH  *UNUSUAL BRUISING OR BLEEDING  TENDERNESS IN MOUTH AND THROAT WITH OR WITHOUT PRESENCE OF ULCERS  *URINARY PROBLEMS  *BOWEL PROBLEMS  UNUSUAL RASH Items with * indicate a potential emergency and should be followed up as soon as possible.  Feel free to call the clinic you have any questions or concerns. The clinic phone number is [phone].  Please show the [place] at check-in to the Emergency Department and triage nurse.

## 2016-08-18 NOTE — Progress Notes (Signed)
Nutrition follow-up completed with patient during induction chemotherapy for supraglottis cancer. Weight decreased and documented as 124.1 pounds down from 126.5 pounds. Patient states last chemotherapy was tough and it is difficult for him to eat for about a week after treatment. He forces himself to eat small amounts and will drink Ensure Plus. No other nutrition impact symptoms.  Nutrition diagnosis: Inadequate oral intake continues.  Intervention: Recommended patient increase Ensure Plus to 6 bottles daily after chemotherapy if he cannot eat food. Provided third complementary case of Ensure Plus. Questions were answered.  Teach back method used.  Monitoring, evaluation, goals: Patient will work to increase oral intake to minimize weight loss.  Next visit: To be scheduled.  **Disclaimer: This note was dictated with voice recognition software. Similar sounding words can inadvertently be transcribed and this note may contain transcription errors which may not have been corrected upon publication of note.**

## 2016-08-21 ENCOUNTER — Ambulatory Visit: Admission: RE | Admit: 2016-08-21 | Payer: Medicaid Other | Source: Ambulatory Visit | Admitting: Radiation Oncology

## 2016-08-21 ENCOUNTER — Telehealth: Payer: Self-pay | Admitting: *Deleted

## 2016-08-21 ENCOUNTER — Ambulatory Visit
Admission: RE | Admit: 2016-08-21 | Discharge: 2016-08-21 | Disposition: A | Discharge status: Home / Self Care | Payer: Medicaid Other | Source: Ambulatory Visit | Attending: Radiation Oncology | Admitting: Radiation Oncology

## 2016-08-21 NOTE — Telephone Encounter (Signed)
Called patient home phone [phone] and cell phone [phone], no answer, left vm on both to call  us back, , he had IV start 1:15pm and ct sim appt at 2pm ,infomed Amy RT Therapist, Anderson Malta, RN 1:59 PM  1:59 PM

## 2016-08-22 ENCOUNTER — Ambulatory Visit
Admission: RE | Admit: 2016-08-22 | Discharge: 2016-08-22 | Disposition: A | Discharge status: Home / Self Care | Payer: Medicaid Other | Source: Ambulatory Visit | Attending: Radiation Oncology | Admitting: Radiation Oncology

## 2016-08-22 ENCOUNTER — Ambulatory Visit (HOSPITAL_BASED_OUTPATIENT_CLINIC_OR_DEPARTMENT_OTHER): Payer: No Typology Code available for payment source

## 2016-08-22 ENCOUNTER — Ambulatory Visit: Payer: Self-pay

## 2016-08-22 VITALS — BP 111/78 | HR 105 | Temp 99.8°F | Ht 65.5 in | Wt 119.8 lb

## 2016-08-22 DIAGNOSIS — C321 Malignant neoplasm of supraglottis: Secondary | ICD-10-CM

## 2016-08-22 DIAGNOSIS — Z5189 Encounter for other specified aftercare: Secondary | ICD-10-CM

## 2016-08-22 DIAGNOSIS — Z510 Encounter for antineoplastic radiation therapy: Secondary | ICD-10-CM | POA: Diagnosis not present

## 2016-08-22 DIAGNOSIS — Z452 Encounter for adjustment and management of vascular access device: Secondary | ICD-10-CM

## 2016-08-22 MED ORDER — SODIUM CHLORIDE 0.9% FLUSH
10.0000 mL | Freq: Once | INTRAVENOUS | Status: AC
  Administered 2016-08-22: 10 mL via INTRAVENOUS

## 2016-08-22 MED ORDER — HEPARIN SOD (PORK) LOCK FLUSH 100 UNIT/ML IV SOLN
500.0000 [IU] | Freq: Once | INTRAVENOUS | Status: AC | PRN
  Administered 2016-08-22: 500 [IU]
  Filled 2016-08-22: qty 5

## 2016-08-22 MED ORDER — SODIUM CHLORIDE 0.9% FLUSH
10.0000 mL | INTRAVENOUS | Status: DC | PRN
  Administered 2016-08-22: 10 mL
  Filled 2016-08-22: qty 10

## 2016-08-22 MED ORDER — PEGFILGRASTIM INJECTION 6 MG/0.6ML ~~LOC~~
6.0000 mg | PREFILLED_SYRINGE | Freq: Once | SUBCUTANEOUS | Status: AC
  Administered 2016-08-22: 6 mg via SUBCUTANEOUS
  Filled 2016-08-22: qty 0.6

## 2016-08-22 NOTE — Progress Notes (Signed)
Patient is to receive IV contrast today.  He currently has access to his portacath and is receiving 5FU through a pump.  Spoke with Dr. Alvy Bimler to confirm safety of giving IV contrast while patient is also getting a chemo drug administered into body.  She agreed it was safe to proceed as long as the patient has a separate access preferably to opposite side of the body.

## 2016-08-22 NOTE — Progress Notes (Signed)
Has armband been applied?  Yes  Does patient have an allergy to IV contrast dye?: No   Has patient ever received premedication for IV contrast dye?:   Does patient take metformin?: No  If patient does take metformin when was the last dose: N/A  Date of lab work: 08/15/16 BUN: 9.2 CR: 0.8  IV site: Left AC  Has IV site been added to flowsheet? yes   ,BP 111/78   Pulse (!) 105   Temp 99.8 F (37.7 C)   Ht 5' 5.5" (1.664 m)   Wt 119 lb 12.8 oz (54.3 kg)   SpO2 100% Comment: room air  BMI 19.63 kg/m

## 2016-08-22 NOTE — Progress Notes (Signed)
Head and Neck Cancer Simulation, IMRT treatment planning  note   Outpatient  Diagnosis:    ICD-9-CM ICD-10-CM   1. Malignant neoplasm of supraglottis ([place]) 161.1 C32.1     The patient was taken to the CT simulator and I spoke with him about his treatment / response to chemotherapy thus far.  We had a consent session and I answered all of his questions; he signed the consent form today. He was then laid in the supine position on the table. An Aquaplast head and shoulder mask was custom fitted to the patient's anatomy. High-resolution CT axial imaging was obtained of the head and neck with contrast. I verified that the quality of the imaging is good for treatment planning. 1 Medically Necessary Treatment Device was fabricated and supervised by me: Aquaplast mask.   Treatment planning note I plan to treat the patient with IMRT. I plan to treat the patient's tumor and bilateral neck nodes. I plan to treat to a total dose of 70 Gray in 35  fractions. Dose calculation was ordered from dosimetry.  IMRT planning Note  IMRT is medically necessary and an important modality to deliver adequate dose to the patient's at risk tissues while sparing the patient's normal structures, including the: esophagus, parotid tissue, mandible, brain stem, spinal cord, oral cavity, brachial plexus.  This justifies the use of IMRT in the patient's treatment.   -----------------------------------  Eppie Gibson, MD

## 2016-08-23 ENCOUNTER — Ambulatory Visit: Payer: Self-pay

## 2016-08-25 ENCOUNTER — Telehealth: Payer: Self-pay | Admitting: Hematology and Oncology

## 2016-08-25 NOTE — Telephone Encounter (Signed)
Left message for patient re 6/11 f/u and mailed schedule.

## 2016-08-30 DIAGNOSIS — Z510 Encounter for antineoplastic radiation therapy: Secondary | ICD-10-CM | POA: Diagnosis not present

## 2016-09-01 DIAGNOSIS — Z930 Tracheostomy status: Secondary | ICD-10-CM | POA: Diagnosis not present

## 2016-09-01 NOTE — Addendum Note (Signed)
Addendum  created 09/01/16 0925 by Hollis, Kevin D, MD   Sign clinical note    

## 2016-09-11 ENCOUNTER — Encounter: Payer: Self-pay | Admitting: Hematology and Oncology

## 2016-09-11 ENCOUNTER — Other Ambulatory Visit: Payer: Self-pay | Admitting: Hematology and Oncology

## 2016-09-11 ENCOUNTER — Ambulatory Visit
Admission: RE | Admit: 2016-09-11 | Discharge: 2016-09-11 | Disposition: A | Discharge status: Home / Self Care | Payer: Medicaid Other | Source: Ambulatory Visit | Attending: Radiation Oncology | Admitting: Radiation Oncology

## 2016-09-11 ENCOUNTER — Ambulatory Visit (HOSPITAL_BASED_OUTPATIENT_CLINIC_OR_DEPARTMENT_OTHER): Payer: No Typology Code available for payment source | Admitting: Hematology and Oncology

## 2016-09-11 ENCOUNTER — Encounter: Payer: Self-pay | Admitting: *Deleted

## 2016-09-11 ENCOUNTER — Encounter: Payer: Self-pay | Admitting: Radiation Oncology

## 2016-09-11 VITALS — BP 143/98 | HR 94 | Temp 97.8°F | Wt 125.2 lb

## 2016-09-11 DIAGNOSIS — K5909 Other constipation: Secondary | ICD-10-CM

## 2016-09-11 DIAGNOSIS — C321 Malignant neoplasm of supraglottis: Secondary | ICD-10-CM

## 2016-09-11 DIAGNOSIS — Z510 Encounter for antineoplastic radiation therapy: Secondary | ICD-10-CM | POA: Diagnosis not present

## 2016-09-11 DIAGNOSIS — G893 Neoplasm related pain (acute) (chronic): Secondary | ICD-10-CM

## 2016-09-11 DIAGNOSIS — Z930 Tracheostomy status: Secondary | ICD-10-CM

## 2016-09-11 MED ORDER — SENNOSIDES 8.6 MG PO TABS: 1.0000 | ORAL_TABLET | Freq: Two times a day (BID) | ORAL | 9 refills | Status: DC

## 2016-09-11 MED ORDER — BIAFINE EX EMUL
CUTANEOUS | Status: DC | PRN
  Administered 2016-09-11: 16:00:00 via TOPICAL

## 2016-09-11 MED ORDER — LORAZEPAM 0.5 MG PO TABS: ORAL_TABLET | ORAL | 0 refills | Status: DC

## 2016-09-11 MED FILL — SENNA 8.6 MG TAB: 8.6 | 50 days supply | Qty: 100 | Fill #0

## 2016-09-11 MED FILL — LORazepam 0.5 MG TABS: 0.5 | 20 days supply | Qty: 40 | Fill #0

## 2016-09-11 NOTE — Assessment & Plan Note (Signed)
He has minimum pain and only took a few tablets of pain medicine last week. Continue supportive care

## 2016-09-11 NOTE — Progress Notes (Signed)
[place] OFFICE PROGRESS NOTE  Patient Care Team: Velna Ochs, MD as PCP - General Leota Sauers, RN as Oncology Nurse Navigator Eppie Gibson, MD as Attending Physician (Radiation Oncology) Karie Mainland, RD as Dietitian (Nutrition)  SUMMARY OF ONCOLOGIC HISTORY:   Malignant neoplasm of supraglottis ([place])   04/17/2016 Imaging    Ct neck showed relatively large approximately 3.5 cm laryngeal mass most compatible with laryngeal carcinoma. Epicenter at the glottis on the right. Extension across midline as well as involvement of laryngeal cartilages (possibly also bilateral). 2. Relatively small but malignant appearing right level 2 lymphadenopathy. Suspicious contralateral left level 2 lymph nodes.      04/21/2016 Initial Diagnosis    He saw Dr. Constance Holster in the clinic for chronic hoarseness. In the office, it was noted that the posterior soft palate, uvula, tongue base and vallecula were visualized and appeared healthy without mucosal masses or lesions. There is a large mass involving the right side of the larynx, the right cord is fixed. There may be involvement of the post cricoid area it is difficult to say for sure. 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 Clinically this is at least staged as T3. The airway is significantly impaired. He is at risk for impending airway obstruction. ENT recommended him for awake tracheostomy with direct laryngoscopy and biopsy and esophagoscopy under anesthesia immediately following that.       04/24/2016 Pathology Results    Larynx, biopsy, Right Side - POSITIVE FOR INVASIVE SQUAMOUS CELL CARCINOMA. - SEE COMMENT. Microscopic Comment As sampled, the squamous cell carcinoma appears well differentiated. A fragment of benign cartilage is also present in the specimen.      04/24/2016 Surgery    She underwent direct laryngoscopy with biopsy. An anterior commissure scope was used to evaluate  the larynx. A large mass and, using the right side of the larynx and the anterior commissure area was identified. Multiple biopsies were taken. The left cord was swollen but seemed to be uninvolved. Piriforms appeared to be clear. The post cricoid area appeared clear. Esophagoscopy was difficult and ENT surgeon was unable to pass the scope into the esophagus. He had tracheostomy      04/24/2016 Pathology Results    Larynx, biopsy, Right Side - POSITIVE FOR INVASIVE SQUAMOUS CELL CARCINOMA. - SEE COMMENT. Microscopic Comment As sampled, the squamous cell carcinoma appears well differentiated. A fragment of benign cartilage is also present in the specimen.       05/15/2016 PET scan    Right eccentric glottic mass, appears to cross the anterior commissure with destructive findings of the thyroid cartilage. There is an upper normal size right station 2A lymph node with maximum SUV of 4.2 which is only very faintly elevated over background, but which could possibly represent a small amount of local metastatic spread. 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 2. Asymmetric calcification along the head of the right caudate nucleus and adjacent lentiform nucleus. Well this can sometimes be physiologic, there is some accompanying reduction in PET activity, and the asymmetry is an unusual feature. Accordingly I recommend MRI of the brain with and without contrast for further characterization. 3. A AP window lymph node is minimally enlarged at 1.1 cm, but with a maximum SUV below background mediastinal levels, likely benign.      05/23/2016 Imaging    MRI brain:  Atrophy with chronic microvascular ischemic change. No intracranial metastatic disease. 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        05/26/2016 Surgery    Multiple extraction of tooth numbers 2, 5, 6, 8-13, and  21-28. 2. 4 Quadrants of alveoloplasty. 3. Bilateral mandibular lingual tori reductions 4. Maxillary right, maxillary left, and mandibular left buccal exostoses reductions      06/02/2016 Procedure    Successful placement of a right internal jugular approach power injectable Port-A-Cath. The catheter is ready for immediate use      06/09/2016 - 08/22/2016 Chemotherapy    He received induction chemotherapy with taxotere, cisplatin and 5 FU x 4 cycles      07/31/2016 PET scan    Mild asymmetric soft tissue prominence along the right supraglottic larynx, without hypermetabolism, likely corresponding to treated tumor. No findings suspicious for metastatic disease. Small mediastinal and left axillary nodes, without associated hypermetabolism.       INTERVAL HISTORY: Please see below for problem oriented charting. He returns for further follow-up He denies recent smoking or drinking His pain is barely noticeable and he takes pain medicine rarely He has gained some weight Majority of the side effects from chemotherapy had resolved He is requesting some laxative for chronic constipation  REVIEW OF SYSTEMS:   Constitutional: Denies fevers, chills or abnormal weight loss Eyes: Denies blurriness of vision Ears, nose, mouth, throat, and face: Denies mucositis or sore throat Respiratory: Denies cough, dyspnea or wheezes Cardiovascular: Denies palpitation, chest discomfort or lower extremity swelling Skin: Denies abnormal skin rashes Lymphatics: Denies new lymphadenopathy or easy bruising Neurological:Denies numbness, tingling or new weaknesses Behavioral/Psych: Mood is stable, no new changes  All other systems were reviewed with the patient and are negative.  I have reviewed the past medical history, past surgical history, social history and family history with the patient and they are unchanged from previous  note.  ALLERGIES:  is allergic to no known allergies.  MEDICATIONS:  Current Outpatient Prescriptions  Medication Sig Dispense Refill  . LORazepam (ATIVAN) 0.5 MG tablet Take 1-2 tablets 30 min before MRI and before mask fabrication to help with claustrophobia. 4 tablet 0  . Morphine Sulfate (MORPHINE CONCENTRATE) 10 mg / 0.5 ml concentrated solution Take 0.5 mLs (10 mg total) by mouth every 2 (two) hours as needed for severe pain. 240 mL 0  . dexamethasone (DECADRON) 4 MG tablet Take 2 tablets (8 mg total) by mouth 2 (two) times daily. Start the day before Taxotere. Take once the day after, then 2 times a day x 2d. (Patient not taking: Reported on 09/11/2016) 20 tablet 1  . ondansetron (ZOFRAN) 8 MG tablet Take 1 tablet (8 mg total) by mouth 2 (two) times daily as needed (Nausea or vomiting). Begin 4 days after chemotherapy. (Patient not taking: Reported on 09/11/2016) 30 tablet 1  . prochlorperazine (COMPAZINE) 10 MG tablet Take 1 tablet (10 mg total) by mouth every 6 (six) hours as needed (Nausea or vomiting). (Patient not taking: Reported on 09/11/2016) 30 tablet 1  . senna (SENOKOT) 8.6 MG tablet Take 1 tablet (8.6 mg total) by mouth 2 (two) times daily. 60 tablet 9   No current facility-administered medications for this visit.     PHYSICAL EXAMINATION: ECOG PERFORMANCE STATUS: 1 - Symptomatic but completely ambulatory  Vitals:   09/11/16 1341  BP: 95/66  Pulse: 87  Resp: 18  Temp: 98.4 F (36.9 C)   Filed Weights   09/11/16 1341  Weight: 125 lb 1.6 oz (56.7 kg)    GENERAL:alert, no distress and comfortable SKIN: skin color, texture, turgor are normal, no rashes or significant lesions EYES: normal, Conjunctiva are pink and non-injected, sclera clear OROPHARYNX:no exudate, no erythema and lips, buccal mucosa, and tongue normal  NECK: Tracheostomy site looks okay  LYMPH:  no palpable lymphadenopathy in the cervical, axillary or inguinal LUNGS: clear to auscultation and  percussion with normal breathing effort HEART: regular rate & rhythm and no murmurs and no lower extremity edema ABDOMEN:abdomen soft, non-tender and normal bowel sounds Musculoskeletal:no cyanosis of digits and no clubbing  NEURO: alert & oriented x 3 with fluent speech, no focal motor/sensory deficits  LABORATORY DATA:  I have reviewed the data as listed    Component Value Date/Time   NA 141 08/15/2016 1330   K 5.0 08/15/2016 1330   CL 101 06/02/2016 1237   CO2 27 08/15/2016 1330   GLUCOSE 88 08/15/2016 1330   BUN 9.2 08/15/2016 1330   CREATININE 0.8 08/15/2016 1330   CALCIUM 9.5 08/15/2016 1330   PROT 6.8 08/15/2016 1330   ALBUMIN 3.2 (L) 08/15/2016 1330   AST 11 08/15/2016 1330   ALT 8 08/15/2016 1330   ALKPHOS 105 08/15/2016 1330   BILITOT <0.22 08/15/2016 1330   GFRNONAA >60 06/02/2016 1237   GFRNONAA 82 03/06/2011 0853   GFRAA >60 06/02/2016 1237   GFRAA >89 03/06/2011 0853    No results found for: SPEP, UPEP  Lab Results  Component Value Date   WBC 9.3 08/15/2016   NEUTROABS 6.0 08/15/2016   HGB 9.6 (L) 08/15/2016   HCT 28.6 (L) 08/15/2016   MCV 89.7 08/15/2016   PLT 472 (H) 08/15/2016      Chemistry      Component Value Date/Time   NA 141 08/15/2016 1330   K 5.0 08/15/2016 1330   CL 101 06/02/2016 1237   CO2 27 08/15/2016 1330   BUN 9.2 08/15/2016 1330   CREATININE 0.8 08/15/2016 1330      Component Value Date/Time   CALCIUM 9.5 08/15/2016 1330   ALKPHOS 105 08/15/2016 1330   AST 11 08/15/2016 1330   ALT 8 08/15/2016 1330   BILITOT <0.22 08/15/2016 1330       ASSESSMENT & PLAN:  Malignant neoplasm of supraglottis ([facility]) The majority of his side effects from chemotherapy had resolved. The patient is starting radiation treatment today I will get his port flushed next month and I will see him back at the end of the month for further assessment  Status post tracheostomy [facility]) The tracheostomy site looks good without signs of bleeding or  infection The plan is to keep the tracheostomy until he has completed his radiation treatment  Cancer associated pain He has minimum pain and only took a few tablets of pain medicine last week. Continue supportive care  Other constipation He had recent constipation I have prescribed Senokot   No orders of the defined types were placed in this encounter.  All questions were answered. The patient knows to call the clinic with any problems, questions or concerns. No barriers to learning was detected. I spent 15 minutes counseling the patient face to face. The total time spent in the appointment was 20 minutes and more than 50% was on counseling and review of test results     Heath Lark, MD 09/11/2016 2:18 PM

## 2016-09-11 NOTE — Progress Notes (Signed)

## 2016-09-11 NOTE — Progress Notes (Signed)
Jonathan [name] presents for his 1st fraction of radiation to his Larynx. He denies pain. He is eating and drinking well per his report. He was provided education today and will begin using biafine twice daily. He has no other concerns at this time.   BP (!) 143/98   Pulse 94   Temp 97.8 F (36.6 C)   Wt 125 lb 3.2 oz (56.8 kg)   SpO2 100% Comment: room air  BMI 20.52 kg/m    Wt Readings from Last 3 Encounters:  09/11/16 125 lb 3.2 oz (56.8 kg)  09/11/16 125 lb 1.6 oz (56.7 kg)  08/22/16 119 lb 12.8 oz (54.3 kg)

## 2016-09-11 NOTE — Assessment & Plan Note (Signed)
The majority of his side effects from chemotherapy had resolved. The patient is starting radiation treatment today I will get his port flushed next month and I will see him back at the end of the month for further assessment

## 2016-09-11 NOTE — Assessment & Plan Note (Addendum)
The tracheostomy site looks good without signs of bleeding or infection The plan is to keep the tracheostomy until he has completed his radiation treatment

## 2016-09-11 NOTE — Progress Notes (Signed)
Oncology Nurse Navigator Documentation  Met with patient during Established Patient appt with Dr. Alvy Bimler.  He was accompanied by his wife. He understands chemo completed, next phase of treatment begins today with RT. He understands importance of maintaining regular BMs. He denied pain with swallowing, dysphagia. I informed him of 6/18 1530 SLP appt at [facility].  He voiced understanding. I escorted him to Radiation Waiting, answered his questions about RT, side effects.  [name], RN, BSN, [place] [facility] Nurse [facility] [phone]

## 2016-09-11 NOTE — Assessment & Plan Note (Signed)
He had recent constipation I have prescribed Senokot

## 2016-09-12 ENCOUNTER — Ambulatory Visit
Admission: RE | Admit: 2016-09-12 | Discharge: 2016-09-12 | Disposition: A | Discharge status: Home / Self Care | Payer: Medicaid Other | Source: Ambulatory Visit | Attending: Radiation Oncology | Admitting: Radiation Oncology

## 2016-09-12 DIAGNOSIS — Z510 Encounter for antineoplastic radiation therapy: Secondary | ICD-10-CM | POA: Diagnosis not present

## 2016-09-12 NOTE — Progress Notes (Signed)
   Weekly Management Note:  Outpatient    ICD-10-CM   1. Malignant neoplasm of supraglottis (HCC) C32.1   2. 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  CBCT/MVCT images/Port film x-rays were reviewed.  The chart was checked. "I feel good." no complaints.   Physical Findings:  Wt Readings from Last 3 Encounters:  09/11/16 125 lb 3.2 oz (56.8 kg)  09/11/16 125 lb 1.6 oz (56.7 kg)  08/22/16 119 lb 12.8 oz (54.3 kg)    weight is 125 lb 3.2 oz (56.8 kg). His temperature is 97.8 F (36.6 C). His blood pressure is 143/98 (abnormal) and his pulse is 94. His oxygen saturation is 100%.   No mucositis or skin reaction over neck CBC    Component Value Date/Time   WBC 9.3 08/15/2016 1330   WBC 8.1 06/02/2016 1237   RBC 3.19 (L) 08/15/2016 1330   RBC 4.43 06/02/2016 1237   HGB 9.6 (L) 08/15/2016 1330   HCT 28.6 (L) 08/15/2016 1330   PLT 472 (H) 08/15/2016 1330   MCV 89.7 08/15/2016 1330   MCH 30.2 08/15/2016 1330   MCH 29.8 06/02/2016 1237   MCHC 33.7 08/15/2016 1330   MCHC 33.7 06/02/2016 1237   RDW 19.2 (H) 08/15/2016 1330   LYMPHSABS 1.7 08/15/2016 1330   MONOABS 1.3 (H) 08/15/2016 1330   EOSABS 0.2 08/15/2016 1330   BASOSABS 0.1 08/15/2016 1330     CMP     Component Value Date/Time   NA 141 08/15/2016 1330   K 5.0 08/15/2016 1330   CL 101 06/02/2016 1237   CO2 27 08/15/2016 1330   GLUCOSE 88 08/15/2016 1330   BUN 9.2 08/15/2016 1330   CREATININE 0.8 08/15/2016 1330   CALCIUM 9.5 08/15/2016 1330   PROT 6.8 08/15/2016 1330   ALBUMIN 3.2 (L) 08/15/2016 1330   AST 11 08/15/2016 1330   ALT 8 08/15/2016 1330   ALKPHOS 105 08/15/2016 1330   BILITOT <0.22 08/15/2016 1330   GFRNONAA >60 06/02/2016 1237   GFRNONAA 82 03/06/2011 0853   GFRAA >60 06/02/2016 1237   GFRAA >89 03/06/2011 0853      Impression:  The patient is tolerating radiotherapy.   Plan:  Continue radiotherapy as planned.   -----------------------------------  Eppie Gibson, MD

## 2016-09-13 ENCOUNTER — Ambulatory Visit
Admission: RE | Admit: 2016-09-13 | Discharge: 2016-09-13 | Disposition: A | Discharge status: Home / Self Care | Payer: Medicaid Other | Source: Ambulatory Visit | Attending: Radiation Oncology | Admitting: Radiation Oncology

## 2016-09-13 ENCOUNTER — Encounter: Payer: Self-pay | Admitting: Nutrition

## 2016-09-13 DIAGNOSIS — Z510 Encounter for antineoplastic radiation therapy: Secondary | ICD-10-CM | POA: Diagnosis not present

## 2016-09-14 ENCOUNTER — Ambulatory Visit
Admission: RE | Admit: 2016-09-14 | Discharge: 2016-09-14 | Disposition: A | Discharge status: Home / Self Care | Payer: Medicaid Other | Source: Ambulatory Visit | Attending: Radiation Oncology | Admitting: Radiation Oncology

## 2016-09-14 DIAGNOSIS — Z510 Encounter for antineoplastic radiation therapy: Secondary | ICD-10-CM | POA: Diagnosis not present

## 2016-09-15 ENCOUNTER — Ambulatory Visit
Admission: RE | Admit: 2016-09-15 | Discharge: 2016-09-15 | Disposition: A | Discharge status: Home / Self Care | Payer: Medicaid Other | Source: Ambulatory Visit | Attending: Radiation Oncology | Admitting: Radiation Oncology

## 2016-09-15 DIAGNOSIS — Z510 Encounter for antineoplastic radiation therapy: Secondary | ICD-10-CM | POA: Diagnosis not present

## 2016-09-18 ENCOUNTER — Ambulatory Visit
Admission: RE | Admit: 2016-09-18 | Discharge: 2016-09-18 | Disposition: A | Discharge status: Home / Self Care | Payer: Medicaid Other | Source: Ambulatory Visit | Attending: Radiation Oncology | Admitting: Radiation Oncology

## 2016-09-18 ENCOUNTER — Ambulatory Visit: Payer: Self-pay | Admitting: Hematology and Oncology

## 2016-09-18 ENCOUNTER — Ambulatory Visit: Payer: Medicaid Other | Attending: Radiation Oncology

## 2016-09-18 ENCOUNTER — Encounter: Payer: Self-pay | Admitting: Radiation Oncology

## 2016-09-18 ENCOUNTER — Ambulatory Visit: Payer: No Typology Code available for payment source

## 2016-09-18 VITALS — BP 133/89 | HR 90 | Temp 98.6°F | Wt 122.6 lb

## 2016-09-18 DIAGNOSIS — R1310 Dysphagia, unspecified: Secondary | ICD-10-CM

## 2016-09-18 DIAGNOSIS — Z510 Encounter for antineoplastic radiation therapy: Secondary | ICD-10-CM | POA: Diagnosis not present

## 2016-09-18 DIAGNOSIS — C321 Malignant neoplasm of supraglottis: Secondary | ICD-10-CM

## 2016-09-18 NOTE — Progress Notes (Addendum)
Jonathan [name] presents for his 6th fraction of radiation to his Larynx. He denies pain or fatigue. He is using morphine solution 1- 3 times daily for pain to his Left shoulder and knees aching. He is eating well per his report. He has no skin changes to his radiation site at this time. He planes to begin using the sonafine cream given to him during his education session. He denies any other concerns at this time.   BP 133/89   Pulse 90   Temp 98.6 F (37 C)   Wt 122 lb 9.6 oz (55.6 kg)   SpO2 100% Comment: room air  BMI 20.09 kg/m    Wt Readings from Last 3 Encounters:  09/18/16 122 lb 9.6 oz (55.6 kg)  09/11/16 125 lb 3.2 oz (56.8 kg)  09/11/16 125 lb 1.6 oz (56.7 kg)

## 2016-09-18 NOTE — Patient Instructions (Signed)
SWALLOWING EXERCISES Do these until 6 months after last day of radiation, then 2 times per week afterwards  1. Effortful Swallows - Press your tongue against the roof of your mouth for 3 seconds, then squeeze          the muscles in your neck while you swallow your saliva or a sip of water - Repeat 20 times, 2-3 times a day, and use whenever you eat or drink  2. Masako Swallow - swallow with your tongue sticking out - Stick tongue out past your teeth and gently bite tongue with your teeth - Swallow, while holding your tongue with your teeth - Repeat 20 times, 2-3 times a day *use a wet spoon if your mouth gets dry*  3. Shaker Exercise - head lift - Lie flat on your back in your bed or on a couch without pillows - Raise your head and look at your feet - KEEP YOUR SHOULDERS DOWN - HOLD FOR 45-60 SECONDS, then lower your head back down - Repeat 3 times, 2-3 times a day  4. Mendelsohn Maneuver - "half swallow" exercise - Start to swallow, and keep your Adam's apple up by squeezing hard with the            muscles of the throat - Hold the squeeze for 5-7 seconds and then relax - Repeat 20 times, 2-3 times a day *use a wet spoon if your mouth gets dry*  5. Breath Hold - Say "HUH!" loudly, then hold your breath for 3 seconds at your voice box - Repeat 20 times, 2-3 times a day  6. Chin pushback - Open your mouth  - Place your fist UNDER your chin near your neck, and push back with your fist for 5 seconds - Repeat 10 times, 2-3 times a day

## 2016-09-18 NOTE — Therapy (Addendum)
[facility] [address] [place], [place], [address] Phone: [phone]   Fax:  [phone]  Speech Language Pathology Evaluation  Patient Details  Name: Jonathan [name] MRN: [id number] Date of Birth: [birth year] Referring Provider: Eppie Gibson MD  Encounter Date: 09/18/2016      End of Session - 09/18/16 1650    Visit Number 1   Number of Visits 4   Date for SLP Re-Evaluation 03/20/17   SLP Start Time 7673   SLP Stop Time  1626   SLP Time Calculation (min) 46 min   Activity Tolerance Patient tolerated treatment well      Past Medical History:  Diagnosis Date  . Allergic rhinitis   . Arthritis    Bilateral knee arthritis for about 20 years. Not taking any medications, believes in nature processes  . Cancer (HCC)    larynx  . Colon polyp   . Dental caries    prechemoradiation therapy, periodontitis  . GERD (gastroesophageal reflux disease)   . Headache   . HLD (hyperlipidemia)     Past Surgical History:  Procedure Laterality Date  . COLONOSCOPY  03/18/2012  . ESOPHAGOSCOPY  04/24/2016   Procedure: ESOPHAGOSCOPY;  Surgeon: Izora Gala, MD;  Location: [facility];  Service: ENT;;  . Everlean Alstrom GENERIC HISTORICAL  06/02/2016   IR US GUIDE VASC ACCESS RIGHT 06/02/2016 Sandi Mariscal, MD [facility]  . IR GENERIC HISTORICAL  06/02/2016   IR FLUORO GUIDE PORT INSERTION RIGHT 06/02/2016 Sandi Mariscal, MD [facility]  . LARYNGOSCOPY  04/24/2016   Procedure: LARYNGOSCOPY;  Surgeon: Izora Gala, MD;  Location: [facility];  Service: ENT;;  . MULTIPLE EXTRACTIONS WITH ALVEOLOPLASTY N/A 05/26/2016   Procedure: Extraction of tooth #'s 2,5,6,8-13, 21-28 with alveoloplasty, bilateral mandibular tori reductions and buccal exostoses reductions of maxillary left, maxillary right, and mandibular left quadrants.;  Surgeon: Lenn Cal, DDS;  Location: [facility];  Service: Oral Surgery;  Laterality: N/A;  . TRACHEOSTOMY TUBE PLACEMENT N/A 04/24/2016    Procedure: TRACHEOSTOMY;  Surgeon: Izora Gala, MD;  Location: [facility];  Service: ENT;  Laterality: N/A;    There were no vitals filed for this visit.      Subjective Assessment - 09/18/16 1600    Subjective "I can't eat a steak at all - can't chew it up." Pt also stays away from grittier textures.   Currently in Pain? No/denies            SLP Evaluation [facility] - 09/18/16 1600      SLP Visit Information   SLP Received On 09/18/16   Referring Provider Eppie Gibson MD   Onset Date January 2018   Medical Diagnosis Supraglottic CA     General Information   HPI Pt underwent chemo until 08-22-16, now is undergoing rad tx. Has had 6 sessions/35 sessions.  Is taking 2-3 bottles Ensure a day.      Prior Functional Status   Cognitive/Linguistic Baseline Within functional limits     Cognition   Overall Cognitive Status Within Functional Limits for tasks assessed     Auditory Comprehension   Overall Auditory Comprehension Appears within functional limits for tasks assessed     Verbal Expression   Overall Verbal Expression Appears within functional limits for tasks assessed     Oral Motor/Sensory Function   Overall Oral Motor/Sensory Function Appears within functional limits for tasks assessed     Motor Speech   Overall Motor Speech Appears within functional limits for tasks assessed  Pt currently tolerates soft diet and thin liquids. Pt is endentulous. POs: Pt ate a cheese stick and drank water without overt s/s aspiration. Thyroid elevation appeared WNL, and swallows appeared timely. Oral residue noted as minimal/WNL. Pt's swallow deemed WFL/WNL at this time.   Because data states the risk for dysphagia during and after radiation treatment is high due to undergoing radiation tx, SLP taught pt about the possibility of reduced/limited ability for PO intake during rad tx. SLP encouraged pt to continue swallowing POs as far into rad tx as possible, even ingesting POs and/or  completing HEP shortly after administration of pain meds.   SLP educated pt re: changes to swallowing musculature after rad tx, and why adherence to dysphagia HEP provided today and PO consumption was necessary to inhibit muscular disuse atrophy and to reduce muscle fibrosis following rad tx. Pt demonstrated understanding of these things to SLP.    SLP then developed a HEP for pt and pt was instructed how to perform exercises involving lingual, vocal, and pharyngeal strengthening. SLP performed each exercise and pt return demonstrated each exercise. SLP ensured pt performance was correct prior to moving on to next exercise. Pt was instructed to complete this program 2 times a day until 6 months after his last rad tx, then x2 a week after that.                     SLP Education - 09/18/16 1649    Education provided Yes   Education Details HEP, late effects head/neck radiation   Person(s) Educated Patient   Methods Explanation;Demonstration;Verbal cues;Handout   Comprehension Verbalized understanding;Need further instruction;Returned demonstration          SLP Short Term Goals - 09/18/16 1652      SLP SHORT TERM GOAL #1   Title pt will tell SLP why he is performing exercises --max A   Time 1   Period --  visit   Status New     SLP SHORT TERM GOAL #2   Title pt will perform HEP with rare min A --mod-max A   Time 1   Period --  visit   Status New          SLP Long Term Goals - 09/18/16 1653      SLP LONG TERM GOAL #1   Title pt will perform HEP over two sessions with modified independence -- mod-max A   Time 3   Period --  visits   Status New     SLP LONG TERM GOAL #2   Title pt will tell SLP three overt s/s aspiraiton PNA with modified independence -- not provided yet   Time 2   Period --  visits   Status New          Plan - 09/18/16 1651    Clinical Impression Statement Pt with oropharyngeal swallowing essentially WNL, however the  probability of swallowing difficulty increases dramatically with the initiation of radiation therapy. Pt will need to be followed by SLP for regular assessment of accurate HEP completion as well as for safety with POs both during and following treatment/s.   Speech Therapy Frequency --  once approx every 8 weeks   Duration --  3 visits   Treatment/Interventions Aspiration precaution training;Pharyngeal strengthening exercises;Diet toleration management by SLP;Compensatory techniques;Internal/external aids;SLP instruction and feedback;Patient/family education;Trials of upgraded texture/liquids   Potential to Achieve Goals Good   SLP Home Exercise Plan provided today   Consulted and Agree with  Plan of Care Patient      Patient will benefit from skilled therapeutic intervention in order to improve the following deficits and impairments:   Dysphagia, unspecified type    Problem List Patient Active Problem List   Diagnosis Date Noted  . Anemia due to antineoplastic chemotherapy 06/27/2016  . Other constipation 05/31/2016  . Cancer associated pain 05/18/2016  . Loose, teeth 05/15/2016  . Chronic periodontitis 05/15/2016  . Exostoses, multiple 05/15/2016  . 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 Dental caries 05/15/2016  . 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 Retained dental root 05/15/2016  . Malignant neoplasm of supraglottis ([facility]) 05/10/2016  . Status post tracheostomy ([facility]) 04/24/2016  . Hoarseness 11/21/2015  . Tooth decay 06/03/2015  . History of colonic polyps 03/08/2015  . Weight loss 01/09/2014  . Esophageal reflux 09/12/2013  . Allergic rhinitis 09/12/2013    [facility] ,MS, CCC-SLP  09/18/2016, 5:08 PM  [facility] [address] [place], [place], [address] Phone: [phone]   Fax:  [phone]  Name: Jonathan [name] MRN: [id number] Date of Birth: 25-Sep-1960

## 2016-09-18 NOTE — Progress Notes (Signed)
   Weekly Management Note:  Outpatient    ICD-10-CM   1. 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  CBCT/MVCT images/Port film x-rays were reviewed.  The chart was checked. Reports eating well.  Physical Findings:  Wt Readings from Last 3 Encounters:  09/18/16 122 lb 9.6 oz (55.6 kg)  09/11/16 125 lb 3.2 oz (56.8 kg)  09/11/16 125 lb 1.6 oz (56.7 kg)    weight is 122 lb 9.6 oz (55.6 kg). His temperature is 98.6 F (37 C). His blood pressure is 133/89 and his pulse is 90. His oxygen saturation is 100%.   No mucositis or skin reaction over neck CBC    Component Value Date/Time   WBC 9.3 08/15/2016 1330   WBC 8.1 06/02/2016 1237   RBC 3.19 (L) 08/15/2016 1330   RBC 4.43 06/02/2016 1237   HGB 9.6 (L) 08/15/2016 1330   HCT 28.6 (L) 08/15/2016 1330   PLT 472 (H) 08/15/2016 1330   MCV 89.7 08/15/2016 1330   MCH 30.2 08/15/2016 1330   MCH 29.8 06/02/2016 1237   MCHC 33.7 08/15/2016 1330   MCHC 33.7 06/02/2016 1237   RDW 19.2 (H) 08/15/2016 1330   LYMPHSABS 1.7 08/15/2016 1330   MONOABS 1.3 (H) 08/15/2016 1330   EOSABS 0.2 08/15/2016 1330   BASOSABS 0.1 08/15/2016 1330     CMP     Component Value Date/Time   NA 141 08/15/2016 1330   K 5.0 08/15/2016 1330   CL 101 06/02/2016 1237   CO2 27 08/15/2016 1330   GLUCOSE 88 08/15/2016 1330   BUN 9.2 08/15/2016 1330   CREATININE 0.8 08/15/2016 1330   CALCIUM 9.5 08/15/2016 1330   PROT 6.8 08/15/2016 1330   ALBUMIN 3.2 (L) 08/15/2016 1330   AST 11 08/15/2016 1330   ALT 8 08/15/2016 1330   ALKPHOS 105 08/15/2016 1330   BILITOT <0.22 08/15/2016 1330   GFRNONAA >60 06/02/2016 1237   GFRNONAA 82 03/06/2011 0853   GFRAA >60 06/02/2016 1237   GFRAA >89 03/06/2011 0853     Impression:  The patient is tolerating radiotherapy.   Plan:  Continue radiotherapy as planned.   Encouraged to increase PO  intake, continue SLP and nutritionist appointments. -----------------------------------  Eppie Gibson, MD

## 2016-09-19 ENCOUNTER — Ambulatory Visit
Admission: RE | Admit: 2016-09-19 | Discharge: 2016-09-19 | Disposition: A | Discharge status: Home / Self Care | Payer: Medicaid Other | Source: Ambulatory Visit | Attending: Radiation Oncology | Admitting: Radiation Oncology

## 2016-09-19 ENCOUNTER — Ambulatory Visit: Payer: No Typology Code available for payment source | Admitting: Nutrition

## 2016-09-19 DIAGNOSIS — Z510 Encounter for antineoplastic radiation therapy: Secondary | ICD-10-CM | POA: Diagnosis not present

## 2016-09-19 NOTE — Progress Notes (Signed)
Nutrition follow-up completed with patient and wife after radiation therapy for supraglottis cancer. Weight decreased and documented as 122 pounds on June 18, down from 126.5 pounds. Patient is edentulous and has difficulty chewing.  However, he can drink liquids and consume soft foods. He has been consuming 2 oral nutrition supplements daily. Patient reports lactose intolerance but consumes lactade milk Patient denies nutrition impact symptoms.  Nutrition diagnosis: Inadequate oral intake continues.  Intervention: Educated patient to increase Ensure Plus or equivalent 4 times a day between meals. Provided additional samples and coupons. Educated patient on strategies for blenderizing foods and increasing calories. Questions were answered.  Teach back method used.  Monitoring, evaluation, goals:  Patient will increase oral intake to minimize further weight loss.  Next visit: Tuesday, June 26 after radiation therapy.  **Disclaimer: This note was dictated with voice recognition software. Similar sounding words can inadvertently be transcribed and this note may contain transcription errors which may not have been corrected upon publication of note.**

## 2016-09-20 ENCOUNTER — Telehealth: Payer: Self-pay

## 2016-09-20 ENCOUNTER — Ambulatory Visit
Admission: RE | Admit: 2016-09-20 | Discharge: 2016-09-20 | Disposition: A | Discharge status: Home / Self Care | Payer: Medicaid Other | Source: Ambulatory Visit | Attending: Radiation Oncology | Admitting: Radiation Oncology

## 2016-09-20 DIAGNOSIS — Z510 Encounter for antineoplastic radiation therapy: Secondary | ICD-10-CM | POA: Diagnosis not present

## 2016-09-20 NOTE — Telephone Encounter (Signed)
Spoke with patient per los 6/11 and he is aware of his added appts  Jonathan [name]

## 2016-09-21 ENCOUNTER — Ambulatory Visit
Admission: RE | Admit: 2016-09-21 | Discharge: 2016-09-21 | Disposition: A | Discharge status: Home / Self Care | Payer: Medicaid Other | Source: Ambulatory Visit | Attending: Radiation Oncology | Admitting: Radiation Oncology

## 2016-09-21 DIAGNOSIS — Z510 Encounter for antineoplastic radiation therapy: Secondary | ICD-10-CM | POA: Diagnosis not present

## 2016-09-22 ENCOUNTER — Ambulatory Visit
Admission: RE | Admit: 2016-09-22 | Discharge: 2016-09-22 | Disposition: A | Discharge status: Home / Self Care | Payer: Medicaid Other | Source: Ambulatory Visit | Attending: Radiation Oncology | Admitting: Radiation Oncology

## 2016-09-22 DIAGNOSIS — Z510 Encounter for antineoplastic radiation therapy: Secondary | ICD-10-CM | POA: Diagnosis not present

## 2016-09-25 ENCOUNTER — Ambulatory Visit
Admission: RE | Admit: 2016-09-25 | Discharge: 2016-09-25 | Disposition: A | Discharge status: Home / Self Care | Payer: Medicaid Other | Source: Ambulatory Visit | Attending: Radiation Oncology | Admitting: Radiation Oncology

## 2016-09-25 DIAGNOSIS — Z510 Encounter for antineoplastic radiation therapy: Secondary | ICD-10-CM | POA: Diagnosis not present

## 2016-09-26 ENCOUNTER — Ambulatory Visit
Admission: RE | Admit: 2016-09-26 | Discharge: 2016-09-26 | Disposition: A | Discharge status: Home / Self Care | Payer: Medicaid Other | Source: Ambulatory Visit | Attending: Radiation Oncology | Admitting: Radiation Oncology

## 2016-09-26 ENCOUNTER — Ambulatory Visit: Payer: No Typology Code available for payment source | Admitting: Nutrition

## 2016-09-26 DIAGNOSIS — Z510 Encounter for antineoplastic radiation therapy: Secondary | ICD-10-CM | POA: Diagnosis not present

## 2016-09-26 NOTE — Progress Notes (Signed)
Nutrition follow-up completed with patient being treated for supraglottis cancer. Weight is stable and documented as 122.6 pounds. Patient is still swallowing soft foods like chicken, mashed potatoes and broccoli. He does drink oral nutrition supplements and tries to drink 3 or 4 daily. Reports constipation has resolved.  Nutrition diagnosis: Inadequate oral intake continues.  Intervention: Recommended patient increase Ensure Plus or equivalent 4 times a day between meals. Provided additional samples and coupons. Encouraged patient to continue to try to eat soft foods. Teach back method used.  Monitoring, evaluation, goals:  Patient will work to continue to tolerate oral intake to minimize weight loss.  Next visit: Tuesday, July 10 after radiation.  **Disclaimer: This note was dictated with voice recognition software. Similar sounding words can inadvertently be transcribed and this note may contain transcription errors which may not have been corrected upon publication of note.**

## 2016-09-27 ENCOUNTER — Ambulatory Visit
Admission: RE | Admit: 2016-09-27 | Discharge: 2016-09-27 | Disposition: A | Discharge status: Home / Self Care | Payer: Medicaid Other | Source: Ambulatory Visit | Attending: Radiation Oncology | Admitting: Radiation Oncology

## 2016-09-27 DIAGNOSIS — Z510 Encounter for antineoplastic radiation therapy: Secondary | ICD-10-CM | POA: Diagnosis not present

## 2016-09-28 ENCOUNTER — Ambulatory Visit
Admission: RE | Admit: 2016-09-28 | Discharge: 2016-09-28 | Disposition: A | Discharge status: Home / Self Care | Payer: Medicaid Other | Source: Ambulatory Visit | Attending: Radiation Oncology | Admitting: Radiation Oncology

## 2016-09-28 DIAGNOSIS — Z510 Encounter for antineoplastic radiation therapy: Secondary | ICD-10-CM | POA: Diagnosis not present

## 2016-09-29 ENCOUNTER — Ambulatory Visit
Admission: RE | Admit: 2016-09-29 | Discharge: 2016-09-29 | Disposition: A | Discharge status: Home / Self Care | Payer: Medicaid Other | Source: Ambulatory Visit | Attending: Radiation Oncology | Admitting: Radiation Oncology

## 2016-09-29 DIAGNOSIS — Z510 Encounter for antineoplastic radiation therapy: Secondary | ICD-10-CM | POA: Diagnosis not present

## 2016-10-01 DIAGNOSIS — Z930 Tracheostomy status: Secondary | ICD-10-CM | POA: Diagnosis not present

## 2016-10-02 ENCOUNTER — Other Ambulatory Visit: Payer: Self-pay | Admitting: Radiation Oncology

## 2016-10-02 ENCOUNTER — Ambulatory Visit
Admission: RE | Admit: 2016-10-02 | Discharge: 2016-10-02 | Disposition: A | Discharge status: Home / Self Care | Payer: Medicaid Other | Source: Ambulatory Visit | Attending: Radiation Oncology | Admitting: Radiation Oncology

## 2016-10-02 DIAGNOSIS — C321 Malignant neoplasm of supraglottis: Secondary | ICD-10-CM

## 2016-10-02 DIAGNOSIS — Z510 Encounter for antineoplastic radiation therapy: Secondary | ICD-10-CM | POA: Diagnosis not present

## 2016-10-02 MED ORDER — LIDOCAINE VISCOUS 2 % MT SOLN: OROMUCOSAL | 5 refills | Status: DC

## 2016-10-02 MED ORDER — FLUCONAZOLE 100 MG PO TABS: ORAL_TABLET | ORAL | 0 refills | Status: DC

## 2016-10-02 MED ORDER — LORAZEPAM 0.5 MG PO TABS: ORAL_TABLET | ORAL | 0 refills | Status: DC

## 2016-10-02 MED FILL — FLUCONAZOLE 100 MG TABLET: 100 | 21 days supply | Qty: 22 | Fill #0

## 2016-10-02 MED FILL — LORazepam 0.5 MG TABS: 0.5 | 20 days supply | Qty: 40 | Fill #0

## 2016-10-02 MED FILL — LIDOCAINE 2% VISCOUS SOLN: 2 | 5 days supply | Qty: 100 | Fill #0

## 2016-10-03 ENCOUNTER — Ambulatory Visit
Admission: RE | Admit: 2016-10-03 | Discharge: 2016-10-03 | Disposition: A | Discharge status: Home / Self Care | Payer: Medicaid Other | Source: Ambulatory Visit | Attending: Radiation Oncology | Admitting: Radiation Oncology

## 2016-10-03 DIAGNOSIS — Z510 Encounter for antineoplastic radiation therapy: Secondary | ICD-10-CM | POA: Diagnosis not present

## 2016-10-05 ENCOUNTER — Ambulatory Visit
Admission: RE | Admit: 2016-10-05 | Discharge: 2016-10-05 | Disposition: A | Discharge status: Home / Self Care | Payer: Medicaid Other | Source: Ambulatory Visit | Attending: Radiation Oncology | Admitting: Radiation Oncology

## 2016-10-05 DIAGNOSIS — Z510 Encounter for antineoplastic radiation therapy: Secondary | ICD-10-CM | POA: Diagnosis not present

## 2016-10-06 ENCOUNTER — Ambulatory Visit
Admission: RE | Admit: 2016-10-06 | Discharge: 2016-10-06 | Disposition: A | Discharge status: Home / Self Care | Payer: Medicaid Other | Source: Ambulatory Visit | Attending: Radiation Oncology | Admitting: Radiation Oncology

## 2016-10-06 DIAGNOSIS — Z510 Encounter for antineoplastic radiation therapy: Secondary | ICD-10-CM | POA: Diagnosis not present

## 2016-10-09 ENCOUNTER — Ambulatory Visit
Admission: RE | Admit: 2016-10-09 | Discharge: 2016-10-09 | Disposition: A | Discharge status: Home / Self Care | Payer: Medicaid Other | Source: Ambulatory Visit | Attending: Radiation Oncology | Admitting: Radiation Oncology

## 2016-10-09 DIAGNOSIS — Z510 Encounter for antineoplastic radiation therapy: Secondary | ICD-10-CM | POA: Diagnosis not present

## 2016-10-10 ENCOUNTER — Ambulatory Visit
Admission: RE | Admit: 2016-10-10 | Discharge: 2016-10-10 | Disposition: A | Discharge status: Home / Self Care | Payer: Medicaid Other | Source: Ambulatory Visit | Attending: Radiation Oncology | Admitting: Radiation Oncology

## 2016-10-10 ENCOUNTER — Ambulatory Visit (HOSPITAL_BASED_OUTPATIENT_CLINIC_OR_DEPARTMENT_OTHER): Payer: No Typology Code available for payment source

## 2016-10-10 ENCOUNTER — Ambulatory Visit: Payer: No Typology Code available for payment source | Admitting: Nutrition

## 2016-10-10 DIAGNOSIS — C321 Malignant neoplasm of supraglottis: Secondary | ICD-10-CM

## 2016-10-10 DIAGNOSIS — Z452 Encounter for adjustment and management of vascular access device: Secondary | ICD-10-CM

## 2016-10-10 DIAGNOSIS — Z510 Encounter for antineoplastic radiation therapy: Secondary | ICD-10-CM | POA: Diagnosis not present

## 2016-10-10 MED ORDER — HEPARIN SOD (PORK) LOCK FLUSH 100 UNIT/ML IV SOLN
500.0000 [IU] | Freq: Once | INTRAVENOUS | Status: AC | PRN
  Administered 2016-10-10: 500 [IU]
  Filled 2016-10-10: qty 5

## 2016-10-10 MED ORDER — SODIUM CHLORIDE 0.9% FLUSH
10.0000 mL | INTRAVENOUS | Status: DC | PRN
  Administered 2016-10-10: 10 mL
  Filled 2016-10-10: qty 10

## 2016-10-10 NOTE — Progress Notes (Signed)
Nutrition follow-up completed with patient who is being treated for supraglottis cancer. Weight recorded at 119.2 pounds July 10, down from 122.6 pounds June 18. Patient reports he continues to eat as usual. He is drinking one or 2 oral nutrition supplements daily. He has no other nutrition impact symptoms.  Nutrition diagnosis: Inadequate oral intake continues.  Intervention: I again enforced importance of patient increasing Ensure Plus or equivalent 4 times a day between meals. Provided additional coupons. Reviewed importance of high-calorie, high-protein soft foods. Teach back method used.  Monitoring, evaluation, goals: Patient will work to increase calories and protein, and consume oral nutrition supplements 4 times a day  Next visit: Tuesday, July 17.  **Disclaimer: This note was dictated with voice recognition software. Similar sounding words can inadvertently be transcribed and this note may contain transcription errors which may not have been corrected upon publication of note.**

## 2016-10-11 ENCOUNTER — Ambulatory Visit
Admission: RE | Admit: 2016-10-11 | Discharge: 2016-10-11 | Disposition: A | Discharge status: Home / Self Care | Payer: Medicaid Other | Source: Ambulatory Visit | Attending: Radiation Oncology | Admitting: Radiation Oncology

## 2016-10-11 DIAGNOSIS — Z510 Encounter for antineoplastic radiation therapy: Secondary | ICD-10-CM | POA: Diagnosis not present

## 2016-10-12 ENCOUNTER — Ambulatory Visit
Admission: RE | Admit: 2016-10-12 | Discharge: 2016-10-12 | Disposition: A | Discharge status: Home / Self Care | Payer: Medicaid Other | Source: Ambulatory Visit | Attending: Radiation Oncology | Admitting: Radiation Oncology

## 2016-10-12 DIAGNOSIS — Z510 Encounter for antineoplastic radiation therapy: Secondary | ICD-10-CM | POA: Diagnosis not present

## 2016-10-13 ENCOUNTER — Ambulatory Visit
Admission: RE | Admit: 2016-10-13 | Discharge: 2016-10-13 | Disposition: A | Discharge status: Home / Self Care | Payer: Medicaid Other | Source: Ambulatory Visit | Attending: Radiation Oncology | Admitting: Radiation Oncology

## 2016-10-13 DIAGNOSIS — Z510 Encounter for antineoplastic radiation therapy: Secondary | ICD-10-CM | POA: Diagnosis not present

## 2016-10-16 ENCOUNTER — Ambulatory Visit
Admission: RE | Admit: 2016-10-16 | Discharge: 2016-10-16 | Disposition: A | Discharge status: Home / Self Care | Payer: Medicaid Other | Source: Ambulatory Visit | Attending: Radiation Oncology | Admitting: Radiation Oncology

## 2016-10-16 DIAGNOSIS — Z510 Encounter for antineoplastic radiation therapy: Secondary | ICD-10-CM | POA: Diagnosis not present

## 2016-10-17 ENCOUNTER — Ambulatory Visit
Admission: RE | Admit: 2016-10-17 | Discharge: 2016-10-17 | Disposition: A | Discharge status: Home / Self Care | Payer: Medicaid Other | Source: Ambulatory Visit | Attending: Radiation Oncology | Admitting: Radiation Oncology

## 2016-10-17 ENCOUNTER — Ambulatory Visit: Payer: No Typology Code available for payment source | Admitting: Nutrition

## 2016-10-17 DIAGNOSIS — Z510 Encounter for antineoplastic radiation therapy: Secondary | ICD-10-CM | POA: Diagnosis not present

## 2016-10-17 NOTE — Progress Notes (Signed)
Nutrition follow-up completed with patient being treated for supraglottis cancer. Weight improved documented as 120 pounds July 17 increased from 119.2 pounds July 10. Patient reports eating is now improved. Patient states his mouth is clear and he is able to eat better. He continues to drink oral nutrition supplements. Denies nutrition impact symptoms today.  Nutrition diagnosis: Inadequate oral intake continues.  Intervention: Patient educated to continue strategies for increased calories and protein along with oral nutrition supplements 4 times a day Teach back method used.  Monitoring, evaluation, goals: Patient will tolerate increased calories and protein, and consume oral nutrition supplements to promote weight gain.  Next visit: Tuesday, July 24 after radiation therapy.  **Disclaimer: This note was dictated with voice recognition software. Similar sounding words can inadvertently be transcribed and this note may contain transcription errors which may not have been corrected upon publication of note.**

## 2016-10-18 ENCOUNTER — Ambulatory Visit
Admission: RE | Admit: 2016-10-18 | Discharge: 2016-10-18 | Disposition: A | Discharge status: Home / Self Care | Payer: Medicaid Other | Source: Ambulatory Visit | Attending: Radiation Oncology | Admitting: Radiation Oncology

## 2016-10-18 DIAGNOSIS — Z510 Encounter for antineoplastic radiation therapy: Secondary | ICD-10-CM | POA: Diagnosis not present

## 2016-10-19 ENCOUNTER — Ambulatory Visit
Admission: RE | Admit: 2016-10-19 | Discharge: 2016-10-19 | Disposition: A | Discharge status: Home / Self Care | Payer: Medicaid Other | Source: Ambulatory Visit | Attending: Radiation Oncology | Admitting: Radiation Oncology

## 2016-10-19 DIAGNOSIS — Z510 Encounter for antineoplastic radiation therapy: Secondary | ICD-10-CM | POA: Diagnosis not present

## 2016-10-20 ENCOUNTER — Ambulatory Visit
Admission: RE | Admit: 2016-10-20 | Discharge: 2016-10-20 | Disposition: A | Discharge status: Home / Self Care | Payer: Medicaid Other | Source: Ambulatory Visit | Attending: Radiation Oncology | Admitting: Radiation Oncology

## 2016-10-20 DIAGNOSIS — Z510 Encounter for antineoplastic radiation therapy: Secondary | ICD-10-CM | POA: Diagnosis not present

## 2016-10-23 ENCOUNTER — Ambulatory Visit
Admission: RE | Admit: 2016-10-23 | Discharge: 2016-10-23 | Disposition: A | Discharge status: Home / Self Care | Payer: Medicaid Other | Source: Ambulatory Visit | Attending: Radiation Oncology | Admitting: Radiation Oncology

## 2016-10-23 ENCOUNTER — Encounter: Payer: Self-pay | Admitting: Radiation Oncology

## 2016-10-23 DIAGNOSIS — Z510 Encounter for antineoplastic radiation therapy: Secondary | ICD-10-CM | POA: Diagnosis not present

## 2016-10-24 ENCOUNTER — Ambulatory Visit: Payer: No Typology Code available for payment source | Admitting: Nutrition

## 2016-10-24 ENCOUNTER — Ambulatory Visit
Admission: RE | Admit: 2016-10-24 | Discharge: 2016-10-24 | Disposition: A | Discharge status: Home / Self Care | Payer: Medicaid Other | Source: Ambulatory Visit | Attending: Radiation Oncology | Admitting: Radiation Oncology

## 2016-10-24 DIAGNOSIS — Z510 Encounter for antineoplastic radiation therapy: Secondary | ICD-10-CM | POA: Diagnosis not present

## 2016-10-24 NOTE — Progress Notes (Signed)
Follow-up with patient being treated for supraglottis cancer. He denies nausea, vomiting, constipation, and diarrhea. He is continuing to eat by mouth but cannot chew foods secondary to no teeth. He continues to drink oral nutrition supplements. Patient wishes he can eat meat.  Nutrition diagnosis: Inadequate oral intake continues.  Intervention: Educated patient on strategies for blenderizing or pureing meats and other foods. Encouraged patient to continue oral nutrition supplements for calories and protein. Questions were answered.  Teach back method used.  Monitoring, evaluation, goals: Patient will tolerate increased calories and protein to promote weight gain.  Next visit: Monday, July 30 after his final radiation therapy  **Disclaimer: This note was dictated with voice recognition software. Similar sounding words can inadvertently be transcribed and this note may contain transcription errors which may not have been corrected upon publication of note.**

## 2016-10-25 ENCOUNTER — Ambulatory Visit (HOSPITAL_BASED_OUTPATIENT_CLINIC_OR_DEPARTMENT_OTHER): Payer: No Typology Code available for payment source

## 2016-10-25 ENCOUNTER — Ambulatory Visit
Admission: RE | Admit: 2016-10-25 | Discharge: 2016-10-25 | Disposition: A | Discharge status: Home / Self Care | Payer: Medicaid Other | Source: Ambulatory Visit | Attending: Radiation Oncology | Admitting: Radiation Oncology

## 2016-10-25 ENCOUNTER — Other Ambulatory Visit: Payer: Self-pay | Admitting: Hematology and Oncology

## 2016-10-25 DIAGNOSIS — C321 Malignant neoplasm of supraglottis: Secondary | ICD-10-CM

## 2016-10-25 DIAGNOSIS — Z510 Encounter for antineoplastic radiation therapy: Secondary | ICD-10-CM | POA: Diagnosis not present

## 2016-10-25 LAB — COMPREHENSIVE METABOLIC PANEL
ALT: 8 U/L (ref 0–55)
AST: 14 U/L (ref 5–34)
Albumin: 4.1 g/dL (ref 3.5–5.0)
Alkaline Phosphatase: 72 U/L (ref 40–150)
Anion Gap: 9 mEq/L (ref 3–11)
BILIRUBIN TOTAL: 0.22 mg/dL (ref 0.20–1.20)
BUN: 8.7 mg/dL (ref 7.0–26.0)
CHLORIDE: 102 meq/L (ref 98–109)
CO2: 29 meq/L (ref 22–29)
CREATININE: 0.9 mg/dL (ref 0.7–1.3)
Calcium: 10 mg/dL (ref 8.4–10.4)
EGFR: >90 mL/min/{1.73_m2} (ref >90)
Glucose: 91 mg/dl (ref 70–140)
Potassium: 4.7 mEq/L (ref 3.5–5.1)
Sodium: 140 mEq/L (ref 136–145)
TOTAL PROTEIN: 7.3 g/dL (ref 6.4–8.3)

## 2016-10-25 LAB — MAGNESIUM: Magnesium: 2.1 mg/dl (ref 1.5–2.5)

## 2016-10-26 ENCOUNTER — Ambulatory Visit (HOSPITAL_BASED_OUTPATIENT_CLINIC_OR_DEPARTMENT_OTHER): Payer: No Typology Code available for payment source | Admitting: Hematology and Oncology

## 2016-10-26 ENCOUNTER — Other Ambulatory Visit: Payer: Self-pay

## 2016-10-26 ENCOUNTER — Encounter: Payer: Self-pay | Admitting: Hematology and Oncology

## 2016-10-26 ENCOUNTER — Ambulatory Visit
Admission: RE | Admit: 2016-10-26 | Discharge: 2016-10-26 | Disposition: A | Discharge status: Home / Self Care | Payer: Medicaid Other | Source: Ambulatory Visit | Attending: Radiation Oncology | Admitting: Radiation Oncology

## 2016-10-26 DIAGNOSIS — G893 Neoplasm related pain (acute) (chronic): Secondary | ICD-10-CM

## 2016-10-26 DIAGNOSIS — C321 Malignant neoplasm of supraglottis: Secondary | ICD-10-CM

## 2016-10-26 DIAGNOSIS — Z510 Encounter for antineoplastic radiation therapy: Secondary | ICD-10-CM | POA: Diagnosis not present

## 2016-10-26 DIAGNOSIS — Z930 Tracheostomy status: Secondary | ICD-10-CM

## 2016-10-26 MED ORDER — MORPHINE SULFATE (CONCENTRATE) 10 MG /0.5 ML PO SOLN: 10.0000 mg | ORAL | 0 refills | Status: DC | PRN

## 2016-10-26 NOTE — Progress Notes (Signed)
[place] [facility] OFFICE PROGRESS NOTE  Patient Care Team: Velna Ochs, MD as PCP - General Leota Sauers, RN as Oncology Nurse Navigator Eppie Gibson, MD as Attending Physician (Radiation Oncology) Karie Mainland, RD as Dietitian (Nutrition)  SUMMARY OF ONCOLOGIC HISTORY:   Malignant neoplasm of supraglottis ([place])   04/17/2016 Imaging    Ct neck showed relatively large approximately 3.5 cm laryngeal mass most compatible with laryngeal carcinoma. Epicenter at the glottis on the right. Extension across midline as well as involvement of laryngeal cartilages (possibly also bilateral). 2. Relatively small but malignant appearing right level 2 lymphadenopathy. Suspicious contralateral left level 2 lymph nodes.      04/21/2016 Initial Diagnosis    He saw Dr. Constance Holster in the clinic for chronic hoarseness. In the office, it was noted that the posterior soft palate, uvula, tongue base and vallecula were visualized and appeared healthy without mucosal masses or lesions. There is a large mass involving the right side of the larynx, the right cord is fixed. There may be involvement of the post cricoid area it is difficult to say for sure. 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 Clinically this is at least staged as T3. The airway is significantly impaired. He is at risk for impending airway obstruction. ENT recommended him for awake tracheostomy with direct laryngoscopy and biopsy and esophagoscopy under anesthesia immediately following that.       04/24/2016 Pathology Results    Larynx, biopsy, Right Side - POSITIVE FOR INVASIVE SQUAMOUS CELL CARCINOMA. - SEE COMMENT. Microscopic Comment As sampled, the squamous cell carcinoma appears well differentiated. A fragment of benign cartilage is also present in the specimen.      04/24/2016 Surgery    She underwent direct laryngoscopy with biopsy. An anterior commissure scope was used to evaluate  the larynx. A large mass and, using the right side of the larynx and the anterior commissure area was identified. Multiple biopsies were taken. The left cord was swollen but seemed to be uninvolved. Piriforms appeared to be clear. The post cricoid area appeared clear. Esophagoscopy was difficult and ENT surgeon was unable to pass the scope into the esophagus. He had tracheostomy      04/24/2016 Pathology Results    Larynx, biopsy, Right Side - POSITIVE FOR INVASIVE SQUAMOUS CELL CARCINOMA. - SEE COMMENT. Microscopic Comment As sampled, the squamous cell carcinoma appears well differentiated. A fragment of benign cartilage is also present in the specimen.       05/15/2016 PET scan    Right eccentric glottic mass, appears to cross the anterior commissure with destructive findings of the thyroid cartilage. There is an upper normal size right station 2A lymph node with maximum SUV of 4.2 which is only very faintly elevated over background, but which could possibly represent a small amount of local metastatic spread. 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 2. Asymmetric calcification along the head of the right caudate nucleus and adjacent lentiform nucleus. Well this can sometimes be physiologic, there is some accompanying reduction in PET activity, and the asymmetry is an unusual feature. Accordingly I recommend MRI of the brain with and without contrast for further characterization. 3. A AP window lymph node is minimally enlarged at 1.1 cm, but with a maximum SUV below background mediastinal levels, likely benign.      05/23/2016 Imaging    MRI brain:  Atrophy with chronic microvascular ischemic change. No intracranial metastatic disease. 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        05/26/2016 Surgery    Multiple extraction of tooth numbers 2, 5, 6, 8-13, and  21-28. 2. 4 Quadrants of alveoloplasty. 3. Bilateral mandibular lingual tori reductions 4. Maxillary right, maxillary left, and mandibular left buccal exostoses reductions      06/02/2016 Procedure    Successful placement of a right internal jugular approach power injectable Port-A-Cath. The catheter is ready for immediate use      06/09/2016 - 08/22/2016 Chemotherapy    He received induction chemotherapy with taxotere, cisplatin and 5 FU x 4 cycles      07/31/2016 PET scan    Mild asymmetric soft tissue prominence along the right supraglottic larynx, without hypermetabolism, likely corresponding to treated tumor. No findings suspicious for metastatic disease. Small mediastinal and left axillary nodes, without associated hypermetabolism.       INTERVAL HISTORY: Please see below for problem oriented charting. He returns for further follow-up He is almost done with radiation treatment He complained of radiation induced dermatitis around his neck No recent fever or chills No recent nausea vomiting.  His pain is well controlled with current prescription morphine sulfate  REVIEW OF SYSTEMS:   Constitutional: Denies fevers, chills or abnormal weight loss Eyes: Denies blurriness of vision Ears, nose, mouth, throat, and face: Denies mucositis or sore throat Respiratory: Denies cough, dyspnea or wheezes Cardiovascular: Denies palpitation, chest discomfort or lower extremity swelling Gastrointestinal:  Denies nausea, heartburn or change in bowel habits Lymphatics: Denies new lymphadenopathy or easy bruising Neurological:Denies numbness, tingling or new weaknesses Behavioral/Psych: Mood is stable, no new changes  All other systems were reviewed with the patient and are negative.  I have reviewed the past medical history, past surgical history, social history and family history with the patient and they are  unchanged from previous note.  ALLERGIES:  is allergic to no known allergies.  MEDICATIONS:  Current Outpatient Prescriptions  Medication Sig Dispense Refill  . lidocaine (XYLOCAINE) 2 % solution Patient: Mix 1part 2% viscous lidocaine, 1part H20. Swish & swallow 76mL of diluted mixture, 6min before meals and at bedtime, up to QID 100 mL 5  . LORazepam (ATIVAN) 0.5 MG tablet Take 1-2 tablets 30 min before radiotherapy to help with claustrophobia. 40 tablet 0  . Morphine Sulfate (MORPHINE CONCENTRATE) 10 mg / 0.5 ml concentrated solution Take 0.5 mLs (10 mg total) by mouth every 2 (two) hours as needed for severe pain. 240 mL 0  . ondansetron (ZOFRAN) 8 MG tablet Take 1 tablet (8 mg total) by mouth 2 (two) times daily as needed (Nausea or vomiting). Begin 4 days after chemotherapy. (Patient not taking: Reported on 09/11/2016) 30 tablet 1  . prochlorperazine (COMPAZINE) 10 MG tablet Take 1 tablet (10 mg total) by mouth every 6 (six) hours as needed (Nausea or vomiting). (Patient not taking: Reported on 09/11/2016) 30 tablet 1  . senna (SENOKOT) 8.6 MG tablet Take 1 tablet (8.6 mg total) by mouth 2 (two) times daily. 60 tablet 9   No current facility-administered medications for this visit.     PHYSICAL EXAMINATION: ECOG PERFORMANCE STATUS: 1 - Symptomatic but completely ambulatory  Vitals:   10/26/16 1351  BP: 126/83  Pulse: 83  Resp: 17  Temp: 98.8 F (37.1 C)   Filed Weights   10/26/16 1351  Weight: 119 lb 1.6 oz (54 kg)    GENERAL:alert, no distress and comfortable SKIN: Noted radiation induced skin dermatitis.  No bleeding EYES: normal, Conjunctiva are pink and non-injected, sclera clear OROPHARYNX:no exudate, no erythema and lips, buccal mucosa, and tongue normal  NECK: Tracheostomy site looks okay LYMPH:  no palpable lymphadenopathy in the cervical, axillary or inguinal LUNGS: clear to auscultation and percussion with normal breathing effort HEART: regular rate & rhythm and  no murmurs and no lower extremity edema ABDOMEN:abdomen soft, non-tender and normal bowel sounds Musculoskeletal:no cyanosis of digits and no clubbing  NEURO: alert & oriented x 3 with fluent speech, no focal motor/sensory deficits  LABORATORY DATA:  I have reviewed the data as listed    Component Value Date/Time   NA 140 10/25/2016 1551   K 4.7 10/25/2016 1551   CL 101 06/02/2016 1237   CO2 29 10/25/2016 1551   GLUCOSE 91 10/25/2016 1551   BUN 8.7 10/25/2016 1551   CREATININE 0.9 10/25/2016 1551   CALCIUM 10.0 10/25/2016 1551   PROT 7.3 10/25/2016 1551   ALBUMIN 4.1 10/25/2016 1551   AST 14 10/25/2016 1551   ALT 8 10/25/2016 1551   ALKPHOS 72 10/25/2016 1551   BILITOT 0.22 10/25/2016 1551   GFRNONAA >60 06/02/2016 1237   GFRNONAA 82 03/06/2011 0853   GFRAA >60 06/02/2016 1237   GFRAA >89 03/06/2011 0853    No results found for: SPEP, UPEP  Lab Results  Component Value Date   WBC 9.3 08/15/2016   NEUTROABS 6.0 08/15/2016   HGB 9.6 (L) 08/15/2016   HCT 28.6 (L) 08/15/2016   MCV 89.7 08/15/2016   PLT 472 (H) 08/15/2016      Chemistry      Component Value Date/Time   NA 140 10/25/2016 1551   K 4.7 10/25/2016 1551   CL 101 06/02/2016 1237   CO2 29 10/25/2016 1551   BUN 8.7 10/25/2016 1551   CREATININE 0.9 10/25/2016 1551      Component Value Date/Time   CALCIUM 10.0 10/25/2016 1551   ALKPHOS 72 10/25/2016 1551   AST 14 10/25/2016 1551   ALT 8 10/25/2016 1551   BILITOT 0.22 10/25/2016 1551     ASSESSMENT & PLAN:  Malignant neoplasm of supraglottis ([place]) So far, he is tolerating radiation well except for residual pain and skin changes He will complete his radiation treatment next week I plan to see him back in 3 weeks for further assessment and supportive care  Status post tracheostomy ([place]) The tracheostomy site looks good without signs of bleeding or infection The plan is to keep the tracheostomy until he has completed his radiation treatment  Cancer  associated pain He has minimum pain  I refill his prescription morphine sulfate to take as needed Continue supportive care   No orders of the defined types were placed in this encounter.  All questions were answered. The patient knows to call the clinic with any problems, questions or concerns. No barriers to learning was detected. I spent 15 minutes counseling the patient face to face. The total time spent in the appointment was 20 minutes and more than 50% was on counseling and review of test results     Heath Lark, MD 10/26/2016 2:20 PM

## 2016-10-26 NOTE — Assessment & Plan Note (Signed)
The tracheostomy site looks good without signs of bleeding or infection The plan is to keep the tracheostomy until he has completed his radiation treatment

## 2016-10-26 NOTE — Assessment & Plan Note (Signed)
So far, he is tolerating radiation well except for residual pain and skin changes He will complete his radiation treatment next week I plan to see him back in 3 weeks for further assessment and supportive care

## 2016-10-26 NOTE — Assessment & Plan Note (Signed)
He has minimum pain  I refill his prescription morphine sulfate to take as needed Continue supportive care

## 2016-10-27 ENCOUNTER — Ambulatory Visit
Admission: RE | Admit: 2016-10-27 | Discharge: 2016-10-27 | Disposition: A | Discharge status: Home / Self Care | Payer: Medicaid Other | Source: Ambulatory Visit | Attending: Radiation Oncology | Admitting: Radiation Oncology

## 2016-10-27 DIAGNOSIS — Z510 Encounter for antineoplastic radiation therapy: Secondary | ICD-10-CM | POA: Diagnosis not present

## 2016-10-27 MED FILL — MORPHINE SULF 100 MG/5 ML S: 100 | 40 days supply | Qty: 240 | Fill #0

## 2016-10-30 ENCOUNTER — Ambulatory Visit
Admission: RE | Admit: 2016-10-30 | Discharge: 2016-10-30 | Disposition: A | Discharge status: Home / Self Care | Payer: Medicaid Other | Source: Ambulatory Visit | Attending: Radiation Oncology | Admitting: Radiation Oncology

## 2016-10-30 ENCOUNTER — Encounter: Payer: Self-pay | Admitting: *Deleted

## 2016-10-30 ENCOUNTER — Ambulatory Visit: Payer: No Typology Code available for payment source | Admitting: Nutrition

## 2016-10-30 DIAGNOSIS — Z510 Encounter for antineoplastic radiation therapy: Secondary | ICD-10-CM | POA: Diagnosis not present

## 2016-10-30 NOTE — Progress Notes (Signed)
Nutrition follow-up completed with patient after final radiation therapy for supraglottis cancer. Patient reports he is eating more every day. States he has tried to eat larger variety of foods. He still does not tolerate bread.  Nutrition diagnosis: Inadequate oral intake improved.  Intervention: Provided support and encouragement for patient to continue strategies for increasing calories and protein. Encouraged patient to contact me for any questions or concerns he might have Patient has my phone number.  Monitoring, evaluation, goals: Patient will increase calories and protein, and minimize weight loss.  Next visit: To be scheduled as needed.  **Disclaimer: This note was dictated with voice recognition software. Similar sounding words can inadvertently be transcribed and this note may contain transcription errors which may not have been corrected upon publication of note.**

## 2016-10-31 ENCOUNTER — Encounter: Payer: Self-pay | Admitting: Radiation Oncology

## 2016-10-31 NOTE — Progress Notes (Signed)
  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 During the pt treatments, he developed dry peeling to bilateral sides of his neck, sore throat, mild fatigue, constipation, decreased appetite, sinus congestion, and left shoulder pain. Pt denied pain throughout treatments. Pt developed radiation related skin changes, including dry peeling to both sides of neck, mostly superficial and mucosa appearing somewhat dry. Pt was advised to use senokot for constipation, increase/push PO fluid intake, sonafine cream PRN to affected areas.  Plan: The patient has completed radiation treatment. Pt was advised to use sennokot for constipation, increase/push PO fluid intake, sonafine cream PRN to affected areas. The patient will return to radiation oncology clinic for routine followup in one half month. I advised them to call or return sooner if they have any questions or concerns related to their recovery or treatment.  -----------------------------------  Eppie Gibson, MD   This document serves as a record of services personally performed by Eppie Gibson, MD. It was created on her behalf by Steva Colder, a trained medical scribe. The creation of this record is based on the scribe's personal observations and the provider's statements to them. This document has been checked and approved by the attending provider.

## 2016-10-31 NOTE — Progress Notes (Signed)
Oncology Nurse Navigator Documentation  Met with Mr. Bagot during final RT to offer support and to celebrate end of radiation treatment.  He was accompanied by his wife. I provided wife with a Certificate of Recognition for her supportive care. I provided verbal/written post-RT guidance:  Importance of keeping follow-up appts with Nutrition and SLP.  Importance of protecting treatment area from sun.  Continuation of Biafine application 2-3 times daily until supply exhausted after which  transition to OTC lotion with vitamin E. I discussed upcoming H&N [name] series starting 9/11.  They expressed interest, voiced understanding additional information will be mailed. I explained my role as their navigator will continue for several more months and that I will be calling and/or joining him during follow-up visits.   I encouraged him to call me with needs/concerns.   Patient and wife verbalized understanding of information provided.  [name], RN, BSN, [facility] [place] [phone]

## 2016-11-01 DIAGNOSIS — Z930 Tracheostomy status: Secondary | ICD-10-CM | POA: Diagnosis not present

## 2016-11-06 ENCOUNTER — Ambulatory Visit: Payer: Medicaid Other | Attending: Radiation Oncology

## 2016-11-06 DIAGNOSIS — R1310 Dysphagia, unspecified: Secondary | ICD-10-CM | POA: Diagnosis present

## 2016-11-06 NOTE — Therapy (Signed)
[facility] [address] [place], [place], [address] Phone: [phone]   Fax:  [phone]  Speech Language Pathology Treatment  Patient Details  Name: Jonathan [name] MRN: [id number] Date of Birth: [birth year] Referring Provider: Eppie Gibson MD  Encounter Date: 11/06/2016      End of Session - 11/06/16 2349    Visit Number 2   Number of Visits 4   Date for SLP Re-Evaluation 03/20/17   Authorization Type medicare   Authorization Time Period 09-25-16 to 12-17-16   Authorization - Visit Number 2   Authorization - Number of Visits 4   SLP Start Time 2426   SLP Stop Time  1404   SLP Time Calculation (min) 42 min   Activity Tolerance Patient tolerated treatment well      Past Medical History:  Diagnosis Date  . Allergic rhinitis   . Arthritis    Bilateral knee arthritis for about 20 years. Not taking any medications, believes in nature processes  . Cancer (HCC)    larynx  . Colon polyp   . Dental caries    prechemoradiation therapy, periodontitis  . GERD (gastroesophageal reflux disease)   . Headache   . HLD (hyperlipidemia)     Past Surgical History:  Procedure Laterality Date  . COLONOSCOPY  03/18/2012  . ESOPHAGOSCOPY  04/24/2016   Procedure: ESOPHAGOSCOPY;  Surgeon: Izora Gala, MD;  Location: [facility];  Service: ENT;;  . Everlean Alstrom GENERIC HISTORICAL  06/02/2016   IR US GUIDE VASC ACCESS RIGHT 06/02/2016 Sandi Mariscal, MD [facility]INTER[facility]  . IR GENERIC HISTORICAL  06/02/2016   IR FLUORO GUIDE PORT INSERTION RIGHT 06/02/2016 Sandi Mariscal, MD [facility]  . LARYNGOSCOPY  04/24/2016   Procedure: LARYNGOSCOPY;  Surgeon: Izora Gala, MD;  Location: [facility];  Service: ENT;;  . MULTIPLE EXTRACTIONS WITH ALVEOLOPLASTY N/A 05/26/2016   Procedure: Extraction of tooth #'s 2,5,6,8-13, 21-28 with alveoloplasty, bilateral mandibular tori reductions and buccal exostoses reductions of maxillary left, maxillary right, and mandibular left quadrants.;   Surgeon: Lenn Cal, DDS;  Location: [facility];  Service: Oral Surgery;  Laterality: N/A;  . TRACHEOSTOMY TUBE PLACEMENT N/A 04/24/2016   Procedure: TRACHEOSTOMY;  Surgeon: Izora Gala, MD;  Location: [facility];  Service: ENT;  Laterality: N/A;    There were no vitals filed for this visit.      Subjective Assessment - 11/06/16 1258    Subjective "I been trying soft foods. And pancakes." (reports these have WFL/WNL pharygeal clearance) Pt also drinking Ensures during the day.   Currently in Pain? No/denies               ADULT SLP TREATMENT - 11/06/16 1301      General Information   Behavior/Cognition Alert;Cooperative;Pleasant mood     Treatment Provided   Treatment provided Dysphagia     Dysphagia Treatment   Temperature Spikes Noted No   Respiratory Status Room air   Oral Cavity - Dentition Edentulous   Treatment Methods Skilled observation;Therapeutic exercise;Compensation strategy training;Patient/caregiver education   Patient observed directly with PO's Yes   Type of PO's observed Thin liquids;Dysphagia 2 (chopped)   Oral Phase Signs & Symptoms --  none noted   Pharyngeal Phase Signs & Symptoms --  none noted   Other treatment/comments "Usually I do the exercises just throughout the day." "You said don't let it get hard. That's why I'm doing (the HEP)." Pt Performed HEP with occasional min A. STressed to pt to complete all exercises  x2/day, and to be seen about every month so SLP can assess correct procedure (req'd occasional min A today). Pt also req'd incr'd encouragement from SLP today to eat POs despite lack of taste.      Assessment / Recommendations / Plan   Plan Continue with current plan of care     Progression Toward Goals   Progression toward goals Progressing toward goals          SLP Education - 11/06/16 2349    Education provided Yes   Education Details HEP, late effects head/neck radiaiton on swallowing   Person(s) Educated Patient;Spouse    Methods Explanation;Demonstration;Verbal cues   Comprehension Verbalized understanding;Returned demonstration;Need further instruction          SLP Short Term Goals - 11/06/16 2352      SLP SHORT TERM GOAL #1   Title pt will tell SLP why he is performing exercises   Baseline max A   Status Achieved     SLP SHORT TERM GOAL #2   Title pt will perform HEP with rare min A   Baseline mod-max A   Status Partially Met          SLP Long Term Goals - 11/06/16 2352      SLP LONG TERM GOAL #1   Title pt will perform HEP over two sessions with modified independence   Baseline mod-max A   Time 2   Period --  visits   Status On-going     SLP LONG TERM GOAL #2   Title pt will tell SLP three overt s/s aspiraiton PNA with modified independence   Baseline not provided yet   Time 1   Period --  visits   Status On-going          Plan - 11/06/16 2351    Clinical Impression Statement Pt with oropharyngeal swallowing WFL, but pt appears timid to attempt wider variety and more frequent POs. The probability of swallowing difficulty increases dramatically with the history of radiation therapy. Pt will need to be followed by SLP for regular assessment of accurate HEP completion as well as for safety with POs both during and following treatment/s.   Speech Therapy Frequency --  once approx every 4-8 weeks   Duration --  3 visits   Treatment/Interventions Aspiration precaution training;Pharyngeal strengthening exercises;Diet toleration management by SLP;Compensatory techniques;Internal/external aids;SLP instruction and feedback;Patient/family education;Trials of upgraded texture/liquids   Potential to Achieve Goals Good   Potential Considerations Cooperation/participation level   SLP Home Exercise Plan provided today   Consulted and Agree with Plan of Care Patient      Patient will benefit from skilled therapeutic intervention in order to improve the following deficits and impairments:    Dysphagia, unspecified type    Problem List Patient Active Problem List   Diagnosis Date Noted  . Anemia due to antineoplastic chemotherapy 06/27/2016  . Other constipation 05/31/2016  . Cancer associated pain 05/18/2016  . Loose, teeth 05/15/2016  . Chronic periodontitis 05/15/2016  . Exostoses, multiple 05/15/2016  . 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 Dental caries 05/15/2016  . 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 Retained dental root 05/15/2016  . 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 Status post tracheostomy ([place]) 04/24/2016  . Hoarseness 11/21/2015  . Tooth decay 06/03/2015  . History of colonic polyps 03/08/2015  . Weight loss 01/09/2014  . Esophageal reflux 09/12/2013  . Allergic rhinitis 09/12/2013    [place] ,[place], CCC-SLP  11/06/2016, 11:53 PM  [facility] [address] [place], [place], [address] Phone: [phone]   Fax:  [phone]   Name: Jonathan [name] MRN: [id number] Date of Birth: [birth year]

## 2016-11-20 ENCOUNTER — Ambulatory Visit (HOSPITAL_BASED_OUTPATIENT_CLINIC_OR_DEPARTMENT_OTHER): Payer: No Typology Code available for payment source

## 2016-11-20 ENCOUNTER — Ambulatory Visit (HOSPITAL_BASED_OUTPATIENT_CLINIC_OR_DEPARTMENT_OTHER): Payer: No Typology Code available for payment source | Admitting: Hematology and Oncology

## 2016-11-20 ENCOUNTER — Telehealth: Payer: Self-pay | Admitting: Hematology and Oncology

## 2016-11-20 ENCOUNTER — Other Ambulatory Visit (HOSPITAL_BASED_OUTPATIENT_CLINIC_OR_DEPARTMENT_OTHER): Payer: No Typology Code available for payment source

## 2016-11-20 VITALS — BP 104/77 | HR 72 | Temp 98.4°F | Resp 18 | Ht 65.5 in | Wt 117.6 lb

## 2016-11-20 DIAGNOSIS — D61818 Other pancytopenia: Secondary | ICD-10-CM

## 2016-11-20 DIAGNOSIS — G893 Neoplasm related pain (acute) (chronic): Secondary | ICD-10-CM

## 2016-11-20 DIAGNOSIS — Z452 Encounter for adjustment and management of vascular access device: Secondary | ICD-10-CM

## 2016-11-20 DIAGNOSIS — C321 Malignant neoplasm of supraglottis: Secondary | ICD-10-CM

## 2016-11-20 DIAGNOSIS — Z930 Tracheostomy status: Secondary | ICD-10-CM

## 2016-11-20 DIAGNOSIS — R634 Abnormal weight loss: Secondary | ICD-10-CM

## 2016-11-20 DIAGNOSIS — D61810 Antineoplastic chemotherapy induced pancytopenia: Secondary | ICD-10-CM

## 2016-11-20 LAB — CBC WITH DIFFERENTIAL/PLATELET
BASO%: 0.8 % (ref 0.0–2.0)
Basophils Absolute: 0 10*3/uL (ref 0.0–0.1)
EOS%: 4.6 % (ref 0.0–7.0)
Eosinophils Absolute: 0.2 10*3/uL (ref 0.0–0.5)
HEMATOCRIT: 35.8 % — AB (ref 38.4–49.9)
HGB: 11.4 g/dL — ABNORMAL LOW (ref 13.0–17.1)
LYMPH#: 0.4 10*3/uL — AB (ref 0.9–3.3)
LYMPH%: 10.5 % — AB (ref 14.0–49.0)
MCH: 29.6 pg (ref 27.2–33.4)
MCHC: 31.8 g/dL — ABNORMAL LOW (ref 32.0–36.0)
MCV: 93 fL (ref 79.3–98.0)
MONO#: 0.5 10*3/uL (ref 0.1–0.9)
MONO%: 13.6 % (ref 0.0–14.0)
NEUT%: 70.5 % (ref 39.0–75.0)
NEUTROS ABS: 2.7 10*3/uL (ref 1.5–6.5)
PLATELETS: 218 10*3/uL (ref 140–400)
RBC: 3.85 10*6/uL — AB (ref 4.20–5.82)
RDW: 16.4 % — ABNORMAL HIGH (ref 11.0–14.6)
WBC: 3.9 10*3/uL — AB (ref 4.0–10.3)

## 2016-11-20 LAB — COMPREHENSIVE METABOLIC PANEL
ALT: 14 U/L (ref 0–55)
ANION GAP: 8 meq/L (ref 3–11)
AST: 18 U/L (ref 5–34)
Albumin: 3.9 g/dL (ref 3.5–5.0)
Alkaline Phosphatase: 64 U/L (ref 40–150)
BUN: 11.4 mg/dL (ref 7.0–26.0)
CO2: 31 mEq/L — ABNORMAL HIGH (ref 22–29)
CREATININE: 0.8 mg/dL (ref 0.7–1.3)
Calcium: 10 mg/dL (ref 8.4–10.4)
Chloride: 104 mEq/L (ref 98–109)
EGFR: >90 mL/min/{1.73_m2} (ref >90)
Glucose: 94 mg/dl (ref 70–140)
Potassium: 3.9 mEq/L (ref 3.5–5.1)
Sodium: 142 mEq/L (ref 136–145)
TOTAL PROTEIN: 6.9 g/dL (ref 6.4–8.3)

## 2016-11-20 LAB — MAGNESIUM: MAGNESIUM: 2.1 mg/dL (ref 1.5–2.5)

## 2016-11-20 MED ORDER — HEPARIN SOD (PORK) LOCK FLUSH 100 UNIT/ML IV SOLN
500.0000 [IU] | Freq: Once | INTRAVENOUS | Status: AC | PRN
  Administered 2016-11-20: 500 [IU]
  Filled 2016-11-20: qty 5

## 2016-11-20 MED ORDER — SODIUM CHLORIDE 0.9% FLUSH
10.0000 mL | INTRAVENOUS | Status: DC | PRN
  Administered 2016-11-20: 10 mL
  Filled 2016-11-20: qty 10

## 2016-11-20 NOTE — Telephone Encounter (Signed)
Gave pt avs and calendar with upcoming dates.

## 2016-11-20 NOTE — Patient Instructions (Signed)
Implanted Port Home Guide An implanted port is a type of central line that is placed under the skin. Central lines are used to provide IV access when treatment or nutrition needs to be given through a person's veins. Implanted ports are used for long-term IV access. An implanted port may be placed because:  You need IV medicine that would be irritating to the small veins in your hands or arms.  You need long-term IV medicines, such as antibiotics.  You need IV nutrition for a long period.  You need frequent blood draws for lab tests.  You need dialysis.  Implanted ports are usually placed in the chest area, but they can also be placed in the upper arm, the abdomen, or the leg. An implanted port has two main parts:  Reservoir. The reservoir is round and will appear as a small, raised area under your skin. The reservoir is the part where a needle is inserted to give medicines or draw blood.  Catheter. The catheter is a thin, flexible tube that extends from the reservoir. The catheter is placed into a large vein. Medicine that is inserted into the reservoir goes into the catheter and then into the vein.  How will I care for my incision site? Do not get the incision site wet. Bathe or shower as directed by your health care provider. How is my port accessed? Special steps must be taken to access the port:  Before the port is accessed, a numbing cream can be placed on the skin. This helps numb the skin over the port site.  Your health care provider uses a sterile technique to access the port. ? Your health care provider must put on a mask and sterile gloves. ? The skin over your port is cleaned carefully with an antiseptic and allowed to dry. ? The port is gently pinched between sterile gloves, and a needle is inserted into the port.  Only "non-coring" port needles should be used to access the port. Once the port is accessed, a blood return should be checked. This helps ensure that the port  is in the vein and is not clogged.  If your port needs to remain accessed for a constant infusion, a clear (transparent) bandage will be placed over the needle site. The bandage and needle will need to be changed every week, or as directed by your health care provider.  Keep the bandage covering the needle clean and dry. Do not get it wet. Follow your health care provider's instructions on how to take a shower or bath while the port is accessed.  If your port does not need to stay accessed, no bandage is needed over the port.  What is flushing? Flushing helps keep the port from getting clogged. Follow your health care provider's instructions on how and when to flush the port. Ports are usually flushed with saline solution or a medicine called heparin. The need for flushing will depend on how the port is used.  If the port is used for intermittent medicines or blood draws, the port will need to be flushed: ? After medicines have been given. ? After blood has been drawn. ? As part of routine maintenance.  If a constant infusion is running, the port may not need to be flushed.  How long will my port stay implanted? The port can stay in for as long as your health care provider thinks it is needed. When it is time for the port to come out, surgery will be   done to remove it. The procedure is similar to the one performed when the port was put in. When should I seek immediate medical care? When you have an implanted port, you should seek immediate medical care if:  You notice a bad smell coming from the incision site.  You have swelling, redness, or drainage at the incision site.  You have more swelling or pain at the port site or the surrounding area.  You have a fever that is not controlled with medicine.  This information is not intended to replace advice given to you by your health care provider. Make sure you discuss any questions you have with your health care provider. Document  Released: 03/20/2005 Document Revised: 08/26/2015 Document Reviewed: 11/25/2012 Elsevier Interactive Patient Education  2017 [employer]  

## 2016-11-21 ENCOUNTER — Encounter: Payer: Self-pay | Admitting: Hematology and Oncology

## 2016-11-21 ENCOUNTER — Encounter: Payer: Self-pay | Admitting: Radiation Oncology

## 2016-11-21 DIAGNOSIS — D61818 Other pancytopenia: Secondary | ICD-10-CM | POA: Insufficient documentation

## 2016-11-21 NOTE — Progress Notes (Signed)
[name] presents for follow up of radiation completed 10/30/16 to his Larynx.   Pain issues, if any: He reports a headache 8/10 that he has had the past few days.  Using a feeding tube?: No Weight changes, if any:  11/24/16 112.2 lbs 10/30/16 116.0 lbs 10/23/16 116.2 lbs 10/16/16  118.8 lbs 11/20/16 117.5 lbs Swallowing issues, if any: He denies difficulty swallowing. It is hard to chew so he eats softer foods. He has been drinking 3 ensure daily, but ran out recently. He plans to purchase more today.  Smoking or chewing tobacco? No Using fluoride trays daily? No Last ENT visit was on: Not since diagnosis (Dr. Constance [name]).  Other notable issues, if any:  Dr. Alvy [name] 11/21/16 and next appointment is 01/04/17. She will repeat imaging end of October.   BP (!) 141/102   Pulse 95   Temp 98.9 F (37.2 C)   Ht 5' 5.5" (1.664 m)   Wt 112 lb 3.2 oz (50.9 kg)   SpO2 100% Comment: room air  BMI 18.39 kg/m   His BP is high today, he reports headaches over the past few days.

## 2016-11-21 NOTE — Assessment & Plan Note (Signed)
So far, he is tolerating radiation well and most of the side effects had resolved Continue supportive care. I plan to repeat imaging study at least 3 months away from radiation treatment, around the end of October 2018

## 2016-11-21 NOTE — Assessment & Plan Note (Signed)
The tracheostomy site looks good without signs of bleeding or infection The plan is to keep the tracheostomy for now until next imaging study

## 2016-11-21 NOTE — Assessment & Plan Note (Signed)
He has mild pancytopenia due to recent treatment He is not symptomatic Observe only

## 2016-11-21 NOTE — Progress Notes (Signed)
[place] OFFICE PROGRESS NOTE  Patient Care Team: Velna Ochs, MD as PCP - General Leota Sauers, RN as Oncology Nurse Navigator Eppie Gibson, MD as Attending Physician (Radiation Oncology) Karie Mainland, RD as Dietitian (Nutrition)  SUMMARY OF ONCOLOGIC HISTORY:   Malignant neoplasm of supraglottis ([place])   04/17/2016 Imaging    Ct neck showed relatively large approximately 3.5 cm laryngeal mass most compatible with laryngeal carcinoma. Epicenter at the glottis on the right. Extension across midline as well as involvement of laryngeal cartilages (possibly also bilateral). 2. Relatively small but malignant appearing right level 2 lymphadenopathy. Suspicious contralateral left level 2 lymph nodes.      04/21/2016 Initial Diagnosis    He saw Dr. Constance Holster in the clinic for chronic hoarseness. In the office, it was noted that the posterior soft palate, uvula, tongue base and vallecula were visualized and appeared healthy without mucosal masses or lesions. There is a large mass involving the right side of the larynx, the right cord is fixed. There may be involvement of the post cricoid area it is difficult to say for sure. 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 Clinically this is at least staged as T3. The airway is significantly impaired. He is at risk for impending airway obstruction. ENT recommended him for awake tracheostomy with direct laryngoscopy and biopsy and esophagoscopy under anesthesia immediately following that.       04/24/2016 Pathology Results    Larynx, biopsy, Right Side - POSITIVE FOR INVASIVE SQUAMOUS CELL CARCINOMA. - SEE COMMENT. Microscopic Comment As sampled, the squamous cell carcinoma appears well differentiated. A fragment of benign cartilage is also present in the specimen.      04/24/2016 Surgery    She underwent direct laryngoscopy with biopsy. An anterior commissure scope was used to evaluate  the larynx. A large mass and, using the right side of the larynx and the anterior commissure area was identified. Multiple biopsies were taken. The left cord was swollen but seemed to be uninvolved. Piriforms appeared to be clear. The post cricoid area appeared clear. Esophagoscopy was difficult and ENT surgeon was unable to pass the scope into the esophagus. He had tracheostomy      04/24/2016 Pathology Results    Larynx, biopsy, Right Side - POSITIVE FOR INVASIVE SQUAMOUS CELL CARCINOMA. - SEE COMMENT. Microscopic Comment As sampled, the squamous cell carcinoma appears well differentiated. A fragment of benign cartilage is also present in the specimen.       05/15/2016 PET scan    Right eccentric glottic mass, appears to cross the anterior commissure with destructive findings of the thyroid cartilage. There is an upper normal size right station 2A lymph node with maximum SUV of 4.2 which is only very faintly elevated over background, but which could possibly represent a small amount of local metastatic spread. 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 2. Asymmetric calcification along the head of the right caudate nucleus and adjacent lentiform nucleus. Well this can sometimes be physiologic, there is some accompanying reduction in PET activity, and the asymmetry is an unusual feature. Accordingly I recommend MRI of the brain with and without contrast for further characterization. 3. A AP window lymph node is minimally enlarged at 1.1 cm, but with a maximum SUV below background mediastinal levels, likely benign.      05/23/2016 Imaging    MRI brain:  Atrophy with chronic microvascular ischemic change. No intracranial metastatic disease. 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        05/26/2016 Surgery    Multiple extraction of tooth numbers 2, 5, 6, 8-13, and  21-28. 2. 4 Quadrants of alveoloplasty. 3. Bilateral mandibular lingual tori reductions 4. Maxillary right, maxillary left, and mandibular left buccal exostoses reductions      06/02/2016 Procedure    Successful placement of a right internal jugular approach power injectable Port-A-Cath. The catheter is ready for immediate use      06/09/2016 - 08/22/2016 Chemotherapy    He received induction chemotherapy with taxotere, cisplatin and 5 FU x 4 cycles      07/31/2016 PET scan    Mild asymmetric soft tissue prominence along the right supraglottic larynx, without hypermetabolism, likely corresponding to treated tumor. No findings suspicious for metastatic disease. Small mediastinal and left axillary nodes, without associated hypermetabolism.      09/11/2016 - 10/30/2016 Radiation Therapy    He received radiation       INTERVAL HISTORY: Please see below for problem oriented charting. He returns for further follow-up His pain has almost completely resolved He denies nausea No recent cough   no recent bleeding or infection around the tracheostomy site He denies recent smoking or drinking He has lost some weight but felt that his appetite has improved since discontinuation of radiation treatment and he is planing to gain some weight before his next appointment  REVIEW OF SYSTEMS:   Constitutional: Denies fevers, chills or abnormal weight loss Eyes: Denies blurriness of vision Ears, nose, mouth, throat, and face: Denies mucositis or sore throat Respiratory: Denies cough, dyspnea or wheezes Cardiovascular: Denies palpitation, chest discomfort or lower extremity swelling Gastrointestinal:  Denies nausea, heartburn or change in bowel habits Skin: Denies abnormal skin rashes Lymphatics: Denies new lymphadenopathy or easy bruising Neurological:Denies numbness, tingling or new weaknesses Behavioral/Psych: Mood is  stable, no new changes  All other systems were reviewed with the patient and are negative.  I have reviewed the past medical history, past surgical history, social history and family history with the patient and they are unchanged from previous note.  ALLERGIES:  is allergic to no known allergies.  MEDICATIONS:  Current Outpatient Prescriptions  Medication Sig Dispense Refill  . lidocaine (XYLOCAINE) 2 % solution Patient: Mix 1part 2% viscous lidocaine, 1part H20. Swish & swallow 18mL of diluted mixture, 58min before meals and at bedtime, up to QID 100 mL 5  . LORazepam (ATIVAN) 0.5 MG tablet Take 1-2 tablets 30 min before radiotherapy to help with claustrophobia. 40 tablet 0  . senna (SENOKOT) 8.6 MG tablet Take 1 tablet (8.6 mg total) by mouth 2 (two) times daily. 60 tablet 9   No current facility-administered medications for this visit.     PHYSICAL EXAMINATION: ECOG PERFORMANCE STATUS: 0 - Asymptomatic  Vitals:   11/20/16 1357  BP: 104/77  Pulse: 72  Resp: 18  Temp: 98.4 F (36.9 C)  SpO2: 100%   Filed Weights   11/20/16 1357  Weight: 117 lb 9.6 oz (53.3 kg)    GENERAL:alert, no distress and comfortable SKIN: skin color, texture, turgor are normal, no rashes or significant lesions EYES: normal, Conjunctiva are pink and non-injected, sclera clear OROPHARYNX:no exudate, no erythema and lips, buccal mucosa, and tongue normal  NECK: Tracheostomy site looks okay  LYMPH:  no palpable lymphadenopathy in  the cervical, axillary or inguinal LUNGS: clear to auscultation and percussion with normal breathing effort HEART: regular rate & rhythm and no murmurs and no lower extremity edema ABDOMEN:abdomen soft, non-tender and normal bowel sounds Musculoskeletal:no cyanosis of digits and no clubbing  NEURO: alert & oriented x 3 with fluent speech, no focal motor/sensory deficits  LABORATORY DATA:  I have reviewed the data as listed    Component Value Date/Time   NA 142 11/20/2016  1300   K 3.9 11/20/2016 1300   CL 101 06/02/2016 1237   CO2 31 (H) 11/20/2016 1300   GLUCOSE 94 11/20/2016 1300   BUN 11.4 11/20/2016 1300   CREATININE 0.8 11/20/2016 1300   CALCIUM 10.0 11/20/2016 1300   PROT 6.9 11/20/2016 1300   ALBUMIN 3.9 11/20/2016 1300   AST 18 11/20/2016 1300   ALT 14 11/20/2016 1300   ALKPHOS 64 11/20/2016 1300   BILITOT <0.22 11/20/2016 1300   GFRNONAA >60 06/02/2016 1237   GFRNONAA 82 03/06/2011 0853   GFRAA >60 06/02/2016 1237   GFRAA >89 03/06/2011 0853    No results found for: SPEP, UPEP  Lab Results  Component Value Date   WBC 3.9 (L) 11/20/2016   NEUTROABS 2.7 11/20/2016   HGB 11.4 (L) 11/20/2016   HCT 35.8 (L) 11/20/2016   MCV 93.0 11/20/2016   PLT 218 11/20/2016      Chemistry      Component Value Date/Time   NA 142 11/20/2016 1300   K 3.9 11/20/2016 1300   CL 101 06/02/2016 1237   CO2 31 (H) 11/20/2016 1300   BUN 11.4 11/20/2016 1300   CREATININE 0.8 11/20/2016 1300      Component Value Date/Time   CALCIUM 10.0 11/20/2016 1300   ALKPHOS 64 11/20/2016 1300   AST 18 11/20/2016 1300   ALT 14 11/20/2016 1300   BILITOT <0.22 11/20/2016 1300      ASSESSMENT & PLAN:  Malignant neoplasm of supraglottis ([place]) So far, he is tolerating radiation well and most of the side effects had resolved Continue supportive care. I plan to repeat imaging study at least 3 months away from radiation treatment, around the end of October 2018  Status post tracheostomy [facility]) The tracheostomy site looks good without signs of bleeding or infection The plan is to keep the tracheostomy for now until next imaging study  Pancytopenia, acquired [facility]) He has mild pancytopenia due to recent treatment He is not symptomatic Observe only  Cancer associated pain This has resolved I will taper him off morphine sulfate  Weight loss He has lost some weight.  I recommend increase oral intake as tolerated   No orders of the defined types were placed in  this encounter.  All questions were answered. The patient knows to call the clinic with any problems, questions or concerns. No barriers to learning was detected. I spent 15 minutes counseling the patient face to face. The total time spent in the appointment was 20 minutes and more than 50% was on counseling and review of test results     Heath Lark, MD 11/21/2016 12:30 PM

## 2016-11-21 NOTE — Assessment & Plan Note (Signed)
This has resolved I will taper him off morphine sulfate

## 2016-11-21 NOTE — Assessment & Plan Note (Signed)
He has lost some weight.  I recommend increase oral intake as tolerated

## 2016-11-24 ENCOUNTER — Ambulatory Visit
Admission: RE | Admit: 2016-11-24 | Discharge: 2016-11-24 | Disposition: A | Discharge status: Home / Self Care | Payer: Medicaid Other | Source: Ambulatory Visit | Attending: Radiation Oncology | Admitting: Radiation Oncology

## 2016-11-24 ENCOUNTER — Encounter: Payer: Self-pay | Admitting: Radiation Oncology

## 2016-11-24 DIAGNOSIS — Z923 Personal history of irradiation: Secondary | ICD-10-CM | POA: Insufficient documentation

## 2016-11-24 DIAGNOSIS — C321 Malignant neoplasm of supraglottis: Secondary | ICD-10-CM | POA: Diagnosis present

## 2016-11-24 HISTORY — DX: Personal history of irradiation: Z92.3

## 2016-11-24 NOTE — Progress Notes (Addendum)
Radiation Oncology         [phone] ________________________________  Name: Jonathan [name] MRN: [id number]  Date: 11/24/2016  DOB: June 26, 1960  Follow-Up Visit Note  CC: Velna Ochs, MD  Velna Ochs, MD  Diagnosis and Prior Radiotherapy:       ICD-10-CM   1. 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  CHIEF COMPLAINT:  Here for follow-up and surveillance of head and neck cancer  Narrative:  The patient returns today for routine follow-up of radiation completed 10/30/2016 to his larynx.    Pain issues, if any: He reports a headache 8/10 that he has had the past few days. He denies vision changes, nausea, or dizziness. He reports his shoulders feel heavy and tense, like "there is literal weight on his shoulders."   Using a feeding tube?: No  Weight changes, if any:  11/24/16 112.2 lbs 10/30/16 116.0 lbs 10/23/16 116.2 lbs 10/16/16  118.8 lbs 11/20/16 117.5 lbs  Swallowing issues, if any: He denies difficulty swallowing. It is hard to chew so he eats softer foods. He has been drinking 3 ensure daily but ran out recently. He plans to purchase more today.   Smoking or chewing tobacco? No  Using fluoride trays daily? No  Last ENT visit was on: Not since diagnosis (Dr. Constance Holster).    Medical Oncology: Dr. Alvy Bimler 11/21/2016 and next appointment is 01/04/2017. She will repeat imaging end of October.               ALLERGIES:  is allergic to no known allergies.  Meds: Current Outpatient Prescriptions  Medication Sig Dispense Refill  . senna (SENOKOT) 8.6 MG tablet Take 1 tablet (8.6 mg total) by mouth 2 (two) times daily. 60 tablet 9  . lidocaine (XYLOCAINE) 2 % solution Patient: Mix 1part 2% viscous lidocaine, 1part H20. Swish & swallow 68mL of diluted mixture, 67min before meals and at bedtime, up to QID  (Patient not taking: Reported on 11/24/2016) 100 mL 5  . LORazepam (ATIVAN) 0.5 MG tablet Take 1-2 tablets 30 min before radiotherapy to help with claustrophobia. (Patient not taking: Reported on 11/24/2016) 40 tablet 0   No current facility-administered medications for this encounter.     Physical Findings:  Wt Readings from Last 3 Encounters:  11/24/16 112 lb 3.2 oz (50.9 kg)  11/20/16 117 lb 9.6 oz (53.3 kg)  11/20/16 117 lb 9.6 oz (53.3 kg)    height is 5' 5.5" (1.664 m) and weight is 112 lb 3.2 oz (50.9 kg). His temperature is 98.9 F (37.2 C). His blood pressure is 141/102 (abnormal) and his pulse is 95. His oxygen saturation is 100%. .  General: Alert and oriented, in no acute distress HEENT: Oropharynx and oral cavity are clear. Neck: Neck is notable for resolving hyperpigmentation over his posterior neck and upper back. There is a small mass, not pathologically enlarged, mobile, at the anterior left level 2 region, about 1 cm in size. No other masses.  Skin: Skin in treatment fields shows satisfactory healing.Marland Kitchen Lymphatics: see Neck Exam Psychiatric: Judgment and insight are intact. Affect is appropriate.   Lab Findings: Lab Results  Component Value Date   WBC 3.9 (L) 11/20/2016   HGB 11.4 (L) 11/20/2016   HCT 35.8 (L) 11/20/2016   MCV 93.0 11/20/2016   PLT 218 11/20/2016    Lab  Results  Component Value Date   TSH 0.388 01/09/2014    Radiographic Findings: No results found.  Impression/Plan:    1) Head and Neck Cancer Status: Healing from radiotherapy.  2) Nutritional Status: Weight is fairly stable. He has been drinking 3 ensure daily and plans to continue this regimen. PEG tube: N/A  3) Risk Factors: The patient has been educated about risk factors including alcohol and tobacco abuse; they understand that avoidance of alcohol and tobacco is important to prevent recurrences as well as other cancers. He abstains.  4) Swallowing: SLP follows  5) Thyroid  function: Because the patient has had  Neck radiotherapy, he is at risk for developing a sluggish thyroid gland. In the future he may need to get a supplement. I advised the patient and his wife to recheck his TSH with his PCP at the beginning of next year and annually.  Lab Results  Component Value Date   TSH 0.388 01/09/2014    6) Other: I advised him to call Dr. Alvy Bimler or me if his headaches persist or if associated symptoms begin to appear in the next week. For skin irritation, I advised him to apply Vitamin E lotion to the treatment site.   7) Follow-up as needed. The patient was encouraged to call with any issues or questions. Dr. Alvy Bimler plans to obtain CT imaging for restaging in October. I will refer the patient to [facility] clinic for survivorship. He is still following with SLP but I will refer him for nutrition, social work, and physical therapy.   Vit E lotion for skin healing.  I spent 20 minutes minutes face to face with the patient and more than 50% of that time was spent in counseling and/or coordination of care. _____________________________________   Eppie Gibson, MD  This document serves as a record of services personally performed by Eppie Gibson, MD. It was created on her behalf by Rae Lips, a trained medical scribe. The creation of this record is based on the scribe's personal observations and the provider's statements to them. This document has been checked and approved by the attending provider.

## 2016-11-27 ENCOUNTER — Other Ambulatory Visit: Payer: Self-pay | Admitting: Hematology and Oncology

## 2016-11-27 DIAGNOSIS — C321 Malignant neoplasm of supraglottis: Secondary | ICD-10-CM

## 2016-12-02 DIAGNOSIS — Z930 Tracheostomy status: Secondary | ICD-10-CM | POA: Diagnosis not present

## 2016-12-06 ENCOUNTER — Telehealth: Payer: Self-pay | Admitting: *Deleted

## 2016-12-06 NOTE — Telephone Encounter (Signed)
Oncology Nurse Navigator Documentation  Spoke with Jonathan [name], confirmed his attendance at 9/11 [facility] [place] with 0800 arrival to Radiation Waiting following lobby registration, same day attendance at [facility] 1800 in [facility].  [name], RN, BSN, [place] [facility]ck Oncology Nurse [place] at [place] [phone]

## 2016-12-12 ENCOUNTER — Ambulatory Visit: Payer: No Typology Code available for payment source | Admitting: Nutrition

## 2016-12-12 ENCOUNTER — Ambulatory Visit: Payer: Medicaid Other | Attending: Radiation Oncology

## 2016-12-12 ENCOUNTER — Ambulatory Visit: Payer: Medicaid Other | Admitting: Physical Therapy

## 2016-12-12 ENCOUNTER — Encounter: Payer: Self-pay | Admitting: *Deleted

## 2016-12-12 ENCOUNTER — Ambulatory Visit
Admission: RE | Admit: 2016-12-12 | Discharge: 2016-12-12 | Disposition: A | Discharge status: Home / Self Care | Payer: Medicaid Other | Source: Ambulatory Visit | Attending: Radiation Oncology | Admitting: Radiation Oncology

## 2016-12-12 VITALS — BP 128/87 | HR 87 | Temp 98.1°F | Wt 115.2 lb

## 2016-12-12 DIAGNOSIS — R29898 Other symptoms and signs involving the musculoskeletal system: Secondary | ICD-10-CM

## 2016-12-12 DIAGNOSIS — I890 Lymphedema, not elsewhere classified: Secondary | ICD-10-CM | POA: Diagnosis present

## 2016-12-12 DIAGNOSIS — R293 Abnormal posture: Secondary | ICD-10-CM | POA: Insufficient documentation

## 2016-12-12 DIAGNOSIS — R1310 Dysphagia, unspecified: Secondary | ICD-10-CM | POA: Diagnosis not present

## 2016-12-12 DIAGNOSIS — C321 Malignant neoplasm of supraglottis: Secondary | ICD-10-CM

## 2016-12-12 NOTE — Progress Notes (Signed)
Nutrition follow up completed during Head and Neck Clinic. Patient completed treatment for supraglottis cancer 10/30/16.  Weight documented as 115.2 pounds on Sept 11, decreased from 117.6 pounds on August 20. Patient reports weight loss is a result of discontinuing Ensure Plus. He ran out and did not have the money to purchase more. He is eating soft foods but cannot chew/edentulous.  Nutrition Diagnosis: Inadequate oral intake continues.  Intervention: Educated patient to consume soft foods at meals TID. Recommended patient resume Ensure Plus TID between meals. Provided a complimentary case of Ensure Plus. Gave patient coupons. Questions answered and teach back method used.  Monitoring, Evaluation, Goals: Patient will increase oral intake to promote weight gain.  Next Visit: To be scheduled as needed.  **Disclaimer: This note was dictated with voice recognition software. Similar sounding words can inadvertently be transcribed and this note may contain transcription errors which may not have been corrected upon publication of note.**

## 2016-12-12 NOTE — Therapy (Signed)
[place], [place], [address] Phone: [phone]   Fax:  [phone]  Physical Therapy Evaluation  Patient Details  Name: Jonathan [name] MRN: [id number] Date of Birth: 21-Apr-1960 Referring Provider: Dr. Eppie Gibson  Encounter Date: 12/12/2016      PT End of Session - 12/12/16 1237    Visit Number 1   Number of Visits 1   PT Start Time 0840   PT Stop Time 0915   PT Time Calculation (min) 35 min   Activity Tolerance Patient tolerated treatment well   Behavior During Therapy [facility] for tasks assessed/performed      Past Medical History:  Diagnosis Date  . Allergic rhinitis   . Arthritis    Bilateral knee arthritis for about 20 years. Not taking any medications, believes in nature processes  . Cancer (HCC)    larynx  . Colon polyp   . Dental caries    prechemoradiation therapy, periodontitis  . GERD (gastroesophageal reflux disease)   . Headache   . History of radiation therapy 09/11/16- 10/30/16   Larynx 70 Gy in 35 fractions  . HLD (hyperlipidemia)     Past Surgical History:  Procedure Laterality Date  . COLONOSCOPY  03/18/2012  . ESOPHAGOSCOPY  04/24/2016   Procedure: ESOPHAGOSCOPY;  Surgeon: Izora Gala, MD;  Location: [place];  Service: ENT;;  . Everlean Alstrom GENERIC HISTORICAL  06/02/2016   IR US GUIDE VASC ACCESS RIGHT 06/02/2016 Sandi Mariscal, MD [facility]  . IR GENERIC HISTORICAL  06/02/2016   IR FLUORO GUIDE PORT INSERTION RIGHT 06/02/2016 Sandi Mariscal, MD [facility]  . LARYNGOSCOPY  04/24/2016   Procedure: LARYNGOSCOPY;  Surgeon: Izora Gala, MD;  Location: [place];  Service: ENT;;  . MULTIPLE EXTRACTIONS WITH ALVEOLOPLASTY N/A 05/26/2016   Procedure: Extraction of tooth #'s 2,5,6,8-13, 21-28 with alveoloplasty, bilateral mandibular tori reductions and buccal exostoses reductions of maxillary left, maxillary right, and mandibular left quadrants.;  Surgeon: Lenn Cal, DDS;  Location: [place];  Service:  Oral Surgery;  Laterality: N/A;  . TRACHEOSTOMY TUBE PLACEMENT N/A 04/24/2016   Procedure: TRACHEOSTOMY;  Surgeon: Izora Gala, MD;  Location: [place];  Service: ENT;  Laterality: N/A;    There were no vitals filed for this visit.       Subjective Assessment - 12/12/16 0845    Subjective Hasn't noticed any swelling.  After treatment, it was rough at first; it's still in my system.  Has lost about 20 lbs.   Pertinent History Has trach in place.   Currently in Pain? No/denies            [facility] PT Assessment - 12/12/16 0001      Assessment   Medical Diagnosis laryngeal, supraglottic invasive squamous cell carcinoma   Referring Provider Dr. Eppie Gibson   Onset Date/Surgical Date 04/24/16   Prior Therapy speech therapy 1 eval + 2 treatments; PT eval 05/30/16     Precautions   Precautions Other (comment)   Precaution Comments cancer precautions     Restrictions   Weight Bearing Restrictions No     Balance Screen   Has the patient fallen in the past 6 months No   Has the patient had a decrease in activity level because of a fear of falling?  No   Is the patient reluctant to leave their home because of a fear of falling?  No     Home Ecologist residence   Living Arrangements  Spouse/significant other   Type of Home Apartment   Home Access Stairs to enter   Entrance Stairs-Number of Steps 19     Prior Function   Level of Independence Independent   Leisure says he's trying to walk 30 minutes or more a day, at a good pace     Cognition   Overall Cognitive Status Within Functional Limits for tasks assessed     Observation/Other Assessments   Observations petite man who looks well with trach in place     Functional Tests   Functional tests Sit to Stand     Sit to Stand   Comments 24 times in 30 seconds  mild dyspnea     Posture/Postural Control   Posture/Postural Control Postural limitations   Postural Limitations Forward head;Rounded  Shoulders   Posture Comments signif. forward head     ROM / Strength   AROM / PROM / Strength AROM     AROM   Overall AROM Comments shoulder A/ROM WFL bilat. all motions   AROM Assessment Site Cervical   Cervical Flexion WFL, limited by trach tube   Cervical Extension approx. 75% limited (trach)    Cervical - Right Side Bend 25% loss   Cervical - Left Side Bend 25% loss   Cervical - Right Rotation 10% loss   Cervical - Left Rotation 10% loss     Palpation   Palpation comment soft fullness at anterior neck above trach     Ambulation/Gait   Ambulation/Gait Yes   Gait Comments says he gets short of breath with going upstairs or a longer distance           LYMPHEDEMA/ONCOLOGY QUESTIONNAIRE - 12/12/16 0854      Head and Neck   4 cm superior to sternal notch around neck 38.3 cm  superior to edge of trach frame   6 cm superior to sternal notch around neck 40.8 cm  superior to edge of trach frame   8 cm superior to sternal notch around neck --  2 cm. superior to edge of trach frame = 37 cm.         Objective measurements completed on examination: See above findings.                  PT Education - 12/12/16 1232    Education provided Yes   Education Details about how to build strength through resistance exercise safely, neck isometric exercises in standing with back to wall and pillow behind head, about [employer] [facility] [employer]) Educated Patient   Methods Explanation;Handout   Comprehension Verbalized understanding                [place] - 12/12/16 1242      CC Long Term Goal  #1   Title Pt. will be aware of [facility] [facility] program and how to participate.   Status Achieved     CC Long Term Goal  #2   Title Pt. will be knowledgeable about how to performa safe strengthening exercise program.   Status Achieved             Plan - 12/12/16 1238    Clinical Impression Statement Pleasant and engaged  gentleman s/p chemoradiation fo invasive squamous cell carcinoma of larynx and supraglottis.  He has forward head, rounded shoulder posture.  Neck ROM has limitations, in part due to trach tube in place. He does not appear to have neck swelling currently,  and he does not feel he does.  He is doing well in terms of having been doing a walking program and wanted to know more about how to build muscle mass safely. this is a re-eval.   Clinical Decision Making --  re-eval   Rehab Potential Good   PT Frequency One time visit   PT Treatment/Interventions Patient/family education   PT Next Visit Plan None at this time.  Patient plans to follow through on strengthening program independently or beginning with [employer] [facility] the [place] gradual progressive strengthening program, continue walking   Consulted and Agree with Plan of Care Patient      Patient will benefit from skilled therapeutic intervention in order to improve the following deficits and impairments:  Postural dysfunction, Decreased strength, Decreased range of motion  Visit Diagnosis: Abnormal posture - Plan: PT plan of care cert/re-cert  Other symptoms and signs involving the musculoskeletal system - Plan: PT plan of care cert/re-cert     Problem List Patient Active Problem List   Diagnosis Date Noted  . Pancytopenia, acquired ([place]) 11/21/2016  . Anemia due to antineoplastic chemotherapy 06/27/2016  . Other constipation 05/31/2016  . Cancer associated pain 05/18/2016  . Loose, teeth 05/15/2016  . Chronic periodontitis 05/15/2016  . Exostoses, multiple 05/15/2016  . 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 Dental caries 05/15/2016  . 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 Retained dental root 05/15/2016  . Malignant neoplasm of supraglottis ([place]) 05/10/2016  . Status post tracheostomy ([place]) 04/24/2016  . Hoarseness 11/21/2015  . Tooth decay 06/03/2015  . History of colonic polyps 03/08/2015   . Weight loss 01/09/2014  . Esophageal reflux 09/12/2013  . Allergic rhinitis 09/12/2013    Hadiya Spoerl 12/12/2016, 12:45 PM  [place], [place], [address] Phone: [phone]   Fax:  [phone]  Name: KELBY ADELL MRN: [id number] Date of Birth: May 08, 1960  Serafina Royals, PT 12/12/16 12:46 PM

## 2016-12-12 NOTE — Progress Notes (Signed)
Head & Neck Multidisciplinary Clinic Clinical Social Work  Clinical Social Work met with patient at head & neck multidisciplinary clinic to offer support and assess for psychosocial needs.  [name] is here for follow up survivorship appointment. [name] has no concerns at this time and plans to attend upcoming Head & Neck Finding Your New Normal survivorship class.   Clinical Social Work briefly discussed Clinical Social Work role and [employer] support programs/services.  Clinical Social Work encouraged patient to call with any additional questions or concerns.   Maryjean Morn, MSW, LCSW, OSW-C Clinical Social Worker [facility] [phone]

## 2016-12-13 NOTE — Patient Instructions (Signed)
Do exercises as prescribed until Valenties Day and then go down to x2/week

## 2016-12-13 NOTE — Therapy (Signed)
[place] [address] [place], [place], [address] Phone: [phone]   Fax:  [phone]  Speech Language Pathology Treatment  Patient Details  Name: Jonathan [name] MRN: [id number] Date of Birth: [birth year] Referring Provider: Eppie Gibson MD  Encounter Date: 12/12/2016      End of Session - 12/13/16 1651    Visit Number 3   Number of Visits 4   Date for SLP Re-Evaluation 03/20/17   Authorization Type [id number]   Authorization Time Period 12-17-16   Authorization - Visit Number 3   Authorization - Number of Visits 4   SLP Start Time 1030   SLP Stop Time  1108   SLP Time Calculation (min) 38 min   Activity Tolerance Patient tolerated treatment well      Past Medical History:  Diagnosis Date  . Allergic rhinitis   . Arthritis    Bilateral knee arthritis for about 20 years. Not taking any medications, believes in nature processes  . Cancer (HCC)    larynx  . Colon polyp   . Dental caries    prechemoradiation therapy, periodontitis  . GERD (gastroesophageal reflux disease)   . Headache   . History of radiation therapy 09/11/16- 10/30/16   Larynx 70 Gy in 35 fractions  . HLD (hyperlipidemia)     Past Surgical History:  Procedure Laterality Date  . COLONOSCOPY  03/18/2012  . ESOPHAGOSCOPY  04/24/2016   Procedure: ESOPHAGOSCOPY;  Surgeon: Izora Gala, MD;  Location: [place];  Service: ENT;;  . Everlean Alstrom GENERIC HISTORICAL  06/02/2016   IR US GUIDE VASC ACCESS RIGHT 06/02/2016 Sandi Mariscal, MD [facility]  . IR GENERIC HISTORICAL  06/02/2016   IR FLUORO GUIDE PORT INSERTION RIGHT 06/02/2016 Sandi Mariscal, MD [facility]  . LARYNGOSCOPY  04/24/2016   Procedure: LARYNGOSCOPY;  Surgeon: Izora Gala, MD;  Location: [place];  Service: ENT;;  . MULTIPLE EXTRACTIONS WITH ALVEOLOPLASTY N/A 05/26/2016   Procedure: Extraction of tooth #'s 2,5,6,8-13, 21-28 with alveoloplasty, bilateral mandibular tori reductions and buccal exostoses  reductions of maxillary left, maxillary right, and mandibular left quadrants.;  Surgeon: Lenn Cal, DDS;  Location: [place];  Service: Oral Surgery;  Laterality: N/A;  . TRACHEOSTOMY TUBE PLACEMENT N/A 04/24/2016   Procedure: TRACHEOSTOMY;  Surgeon: Izora Gala, MD;  Location: [place];  Service: ENT;  Laterality: N/A;    There were no vitals filed for this visit.      Subjective Assessment - 12/12/16 0935    Subjective Fish, potatoes and greens. Chicken noodle with greens. (two meals yesterday). Lurline Idol still in place.    Currently in Pain? No/denies               ADULT SLP TREATMENT - 12/13/16 0001      General Information   Behavior/Cognition Alert;Cooperative;Pleasant mood     Treatment Provided   Treatment provided Dysphagia     Dysphagia Treatment   Temperature Spikes Noted No   Respiratory Status Room air   Oral Cavity - Dentition Edentulous   Treatment Methods Skilled observation;Therapeutic exercise;Compensation strategy training   Patient observed directly with PO's Yes   Type of PO's observed Dysphagia 3 (soft);Thin liquids   Oral Phase Signs & Symptoms --  none noted   Pharyngeal Phase Signs & Symptoms --  none noted   Other treatment/comments Pt endorses doing the HEP average close to two times per day. Req'd SBA today with HEP. He ate [place] sandwich and drank water without overt s/s  aspiration. SLP ensured pt knew to perform HEP until Valentine's Day as prescribed then to down in frequency to x2/week. Pt reprated this back to SLP later in session.  Pt told SLP 3 overt s/s aspiration PNA without cues.     Assessment / Recommendations / Plan   Plan Discharge SLP treatment due to (comment)  pt shown safety x2 months with POs, no cues needed with HEP     Progression Toward Goals   Progression toward goals Goals met, education completed, patient discharged from [place] - 11/06/16 2352      SLP [place] #1   Title  pt will tell SLP why he is performing exercises   Baseline max A   Status Achieved     SLP SHORT TERM GOAL #2   Title pt will perform HEP with rare min A   Baseline mod-max A   Status Partially Met          SLP Long Term Goals - 12/13/16 1653      SLP LONG TERM GOAL #1   Title pt will perform HEP over two sessions with modified independence   Status Partially Met  one visit with independnece     SLP LONG TERM GOAL #2   Title pt will tell SLP three overt s/s aspiraiton PNA with modified independence   Status Achieved          Plan - 12/13/16 1652    Clinical Impression Statement Pt with oropharyngeal swallowing WFL/WNL. Pt reprots trying multiple food items since last visit, and incr'ing frequency of HEP. See "skilled intervention" for details. Pt agrees with d/c today.   Speech Therapy Frequency --  once approx every 4-8 weeks   Duration --  3 visits   Treatment/Interventions Aspiration precaution training;Pharyngeal strengthening exercises;Diet toleration management by SLP;Compensatory techniques;Internal/external aids;SLP instruction and feedback;Patient/family education;Trials of upgraded texture/liquids   Potential to Achieve Goals Good   Potential Considerations Cooperation/participation level   SLP Home Exercise Plan provided today   Consulted and Agree with Plan of Care Patient      Patient will benefit from skilled therapeutic intervention in order to improve the following deficits and impairments:   Dysphagia, unspecified type   SPEECH THERAPY DISCHARGE SUMMARY  Visits from Start of Care: 3  Current functional level related to goals / functional outcomes: Pt met all STGs and 1/2 LTGs. Believed if he would have been seen x1 more visit he would have met second LTG.  Pt incr'd variety of POs and also improved his frequency of HEP in the last month or so, and is now independent with HEP.   Remaining deficits: Mild dysphagia, with dry/bready items mostly due  to decr'd saliva production/xerostomia.   Education / Equipment: Late effects head/neck rad on swallowing, HEP  Plan: Patient agrees to discharge.  Patient goals were partially met. Patient is being discharged due to meeting the stated rehab goals.  ?????(and pt pleased with current functional level)       Problem List Patient Active Problem List   Diagnosis Date Noted  . Pancytopenia, acquired ([place]) 11/21/2016  . Anemia due to antineoplastic chemotherapy 06/27/2016  . Other constipation 05/31/2016  . Cancer associated pain 05/18/2016  . Loose, teeth 05/15/2016  . Chronic periodontitis 05/15/2016  . Exostoses, multiple 05/15/2016  . 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 Dental caries 05/15/2016  . 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  Retained dental root 05/15/2016  . Malignant neoplasm of supraglottis ([place]) 05/10/2016  . Status post tracheostomy ([place]) 04/24/2016  . Hoarseness 11/21/2015  . Tooth decay 06/03/2015  . History of colonic polyps 03/08/2015  . Weight loss 01/09/2014  . Esophageal reflux 09/12/2013  . Allergic rhinitis 09/12/2013    [facility] ,MS, CCC-SLP  12/13/2016, 4:54 PM  [place] [address] [place], [place], [address] Phone: [phone]   Fax:  [phone]   Name: Jonathan [name] MRN: [id number] Date of Birth: [birth year]

## 2016-12-13 NOTE — Progress Notes (Signed)
Oncology Nurse Navigator Documentation  Met with Jonathan [name] during H&N [place].  He was unaccompanied.  Provided verbal and written overview of [place], the clinicians who will be seeing him, encouraged him to ask questions during his time with them.  He was seen by Nutrition, SLP, PT, and SW for post-treatment follow-up.  Spoke with him at end of [facility], addressed questions. He understands I can be contacted with needs/concerns.  [name], RN, BSN, [place] at [place] [phone]

## 2016-12-18 ENCOUNTER — Ambulatory Visit: Payer: Medicaid Other

## 2016-12-20 ENCOUNTER — Telehealth: Payer: Self-pay

## 2016-12-20 ENCOUNTER — Other Ambulatory Visit: Payer: Self-pay

## 2016-12-20 MED ORDER — NYSTATIN 100000 UNIT/ML MT SUSP: 5.0000 mL | Freq: Four times a day (QID) | OROMUCOSAL | 0 refills | Status: AC

## 2016-12-20 MED FILL — NYSTATIN 100,000 UNITS/ML S: 100000 | 7 days supply | Qty: 140 | Fill #0

## 2016-12-20 NOTE — Telephone Encounter (Signed)
Call in nystatin swish and spit 4 times daily X 7 days

## 2016-12-20 NOTE — Telephone Encounter (Signed)
Called with below message. He states that he eating well and maintaining weight. He does not need a nutritional consult. Complaining of yeast infection in mouth. Tongue is white, with small bumps on gums.

## 2016-12-20 NOTE — Telephone Encounter (Signed)
-----   Message from Heath Lark, MD sent at 12/20/2016  8:13 AM EDT ----- Regarding: call him for update Hi Jonathan [name]  Please call and ask how he is doing with eating, whether we need to set up nutritionist consult again  Thanks

## 2016-12-22 ENCOUNTER — Telehealth: Payer: Self-pay | Admitting: *Deleted

## 2016-12-22 NOTE — Telephone Encounter (Signed)
Oncology Nurse Navigator Documentation  Received call from [name].  He reported increased fluid retention in anterior neck over recent weeks, would like PT follow-up for lymphedema evaluation.  He stated availability to attend next Tuesday morning's H&N [facility] to see Jonathan [name], PT.  I provided 0800 arrival to Radiation Waiting following lobby registration.  [name], RN, BSN, [place] Neck Oncology Nurse [place] at [place] [phone]

## 2016-12-25 ENCOUNTER — Telehealth: Payer: Self-pay | Admitting: *Deleted

## 2016-12-25 NOTE — Telephone Encounter (Signed)
Oncology Nurse Navigator Documentation  Spoke with Jonathan [name], confirmed his attendance at tomorrow morning's H&N [place] to see SLP Glendell Docker schinke with 0800 arrival to Radiation Waiting following lobby registration.  [name], RN, BSN, [place] Neck Oncology Nurse [place] at [place] [phone]   .

## 2016-12-26 ENCOUNTER — Encounter: Payer: Self-pay | Admitting: *Deleted

## 2016-12-26 ENCOUNTER — Ambulatory Visit
Admission: RE | Admit: 2016-12-26 | Discharge: 2016-12-26 | Disposition: A | Discharge status: Home / Self Care | Payer: Medicaid Other | Source: Ambulatory Visit | Attending: Radiation Oncology | Admitting: Radiation Oncology

## 2016-12-26 ENCOUNTER — Ambulatory Visit: Payer: Medicaid Other | Admitting: Physical Therapy

## 2016-12-26 VITALS — BP 113/92 | HR 87 | Temp 97.9°F | Wt 115.4 lb

## 2016-12-26 DIAGNOSIS — R1310 Dysphagia, unspecified: Secondary | ICD-10-CM | POA: Diagnosis not present

## 2016-12-26 DIAGNOSIS — I890 Lymphedema, not elsewhere classified: Secondary | ICD-10-CM

## 2016-12-26 DIAGNOSIS — C321 Malignant neoplasm of supraglottis: Secondary | ICD-10-CM

## 2016-12-26 NOTE — Therapy (Addendum)
[name], [place], [address] Phone: [phone]   Fax:  [phone]  Physical Therapy Evaluation  Patient Details  Name: Jonathan [name] MRN: [id number] Date of Birth: 1961-03-11 Referring Provider: Dr. Eppie Gibson  Encounter Date: 12/26/2016      PT End of Session - 12/26/16 0951    Visit Number 1   Number of Visits 3   Date for PT Re-Evaluation 01/31/17   PT Start Time 0830   PT Stop Time 1165   PT Time Calculation (min) 25 min   Activity Tolerance Patient tolerated treatment well   Behavior During Therapy [facility] for tasks assessed/performed      Past Medical History:  Diagnosis Date  . Allergic rhinitis   . Arthritis    Bilateral knee arthritis for about 20 years. Not taking any medications, believes in nature processes  . Cancer (HCC)    larynx  . Colon polyp   . Dental caries    prechemoradiation therapy, periodontitis  . GERD (gastroesophageal reflux disease)   . Headache   . History of radiation therapy 09/11/16- 10/30/16   Larynx 70 Gy in 35 fractions  . HLD (hyperlipidemia)     Past Surgical History:  Procedure Laterality Date  . COLONOSCOPY  03/18/2012  . ESOPHAGOSCOPY  04/24/2016   Procedure: ESOPHAGOSCOPY;  Surgeon: Izora Gala, MD;  Location: [place];  Service: ENT;;  . Everlean Alstrom GENERIC HISTORICAL  06/02/2016   IR US GUIDE VASC ACCESS RIGHT 06/02/2016 Sandi Mariscal, MD [facility]-INTERV RAD  . IR GENERIC HISTORICAL  06/02/2016   IR FLUORO GUIDE PORT INSERTION RIGHT 06/02/2016 Sandi Mariscal, MD [facility]  . LARYNGOSCOPY  04/24/2016   Procedure: LARYNGOSCOPY;  Surgeon: Izora Gala, MD;  Location: [place];  Service: ENT;;  . MULTIPLE EXTRACTIONS WITH ALVEOLOPLASTY N/A 05/26/2016   Procedure: Extraction of tooth #'s 2,5,6,8-13, 21-28 with alveoloplasty, bilateral mandibular tori reductions and buccal exostoses reductions of maxillary left, maxillary right, and mandibular left quadrants.;  Surgeon: Lenn Cal, DDS;  Location: [place];  Service: Oral Surgery;  Laterality: N/A;  . TRACHEOSTOMY TUBE PLACEMENT N/A 04/24/2016   Procedure: TRACHEOSTOMY;  Surgeon: Izora Gala, MD;  Location: [place];  Service: ENT;  Laterality: N/A;    There were no vitals filed for this visit.       Subjective Assessment - 12/26/16 0833    Subjective Just developed swelling.  Woke up the day after head & neck [name] and noticed it.  It helped that someone at [facility] had it and talked about it.   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  Inductive chemo completed 08/22/16; XRT completed 10/30/16.  Dental extractions.  Has trach in place.    Patient Stated Goals help with managing lymphedema   Currently in Pain? No/denies            [facility] PT Assessment - 12/26/16 0001      Assessment   Medical Diagnosis laryngeal, supraglottic invasive squamous cell carcinoma   Referring Provider Dr. Eppie Gibson   Onset Date/Surgical Date 04/24/16   Prior Therapy speech therapy 1 eval + 2 treatments; PT eval 05/30/16     Precautions   Precautions Other (comment)   Precaution Comments cancer precautions     Restrictions   Weight Bearing Restrictions No     Balance Screen   Has the patient fallen in the past 6 months No   Has the patient had a decrease in  activity level because of a fear of falling?  No   Is the patient reluctant to leave their home because of a fear of falling?  No     Home Ecologist residence   Living Arrangements Spouse/significant other   Type of [place] to enter   Entrance Stairs-Number of Steps 19     Prior Function   Level of Independence Independent   Leisure says he's trying to walk 30 minutes or more a day, at a good pace; has been doing exercises he was shown at [facility] last week     Cognition   Overall Cognitive Status Within Functional Limits for tasks assessed      Observation/Other Assessments   Observations petite man who looks well with trach in place;smiling, pleasant, engaged     Palpation   Palpation comment area of fullness at anterior neck just superior to trach     Ambulation/Gait   Ambulation/Gait Yes           LYMPHEDEMA/ONCOLOGY QUESTIONNAIRE - 12/26/16 0834      Type   Cancer Type invasive squamous cell of larynx, supraglottis     Treatment   Past Chemotherapy Treatment Yes   Date 08/22/16   Past Radiation Treatment Yes   Date 10/30/16   Body Site neck     Lymphedema Stage   Stage STAGE 2 SPONTANEOUSLY IRREVERSIBLE     Head and Neck   4 cm superior to sternal notch around neck 38.3 cm  superior to edge of trach frame   6 cm superior to sternal notch around neck 40.2 cm  superior to edge of trach frame   8 cm superior to sternal notch around neck --  2 cm. superior to edge of trach frame = 37.5 cm.         Objective measurements completed on examination: See above findings.                  PT Education - 12/26/16 0902    Education provided Yes   Education Details use of foam chip pack tied snugly around neck 2-4+ hours a day   Person(s) Educated Patient   Methods Explanation;Demonstration;Verbal cues   Comprehension Verbalized understanding                [facility] Goals - 12/26/16 0955      CC Long Term Goal  #1   Title Pt. will be knowledgeable about how to do self-manual lymph drainage.   Time 4   Period Weeks   Status New     CC Long Term Goal  #2   Title Pt. will be knowledgeable about where and how to obtain a manufactured neck compression garment.   Time 4   Period Weeks   Status New             Plan - 12/26/16 4982    Clinical Impression Statement Pleasant gentleman s/p chemo and radiation for invasive squamous cell carcinoma of larynx and supraglottis.  He has a trach in place and has developed some swelling just superior to this.  Although he is a small  man, fullness is palpable and his neck circumference measurement has increased from a couple of weeks ago.  This is a re-eval.,   Rehab Potential Good   PT Frequency --  2 treatment visits   PT Duration 4 weeks   PT Treatment/Interventions ADLs/Self Care Home Management;DME Instruction;Therapeutic  exercise;Patient/family education;Manual techniques;Manual lymph drainage;Compression bandaging;Passive range of motion;Orthotic Fit/Training   PT Next Visit Plan Teach self-manual lymph drainage.  Assist with getting fitted for a manufactured compression garment.  Check on use of foam chip pack.   PT Home Exercise Plan gradual progressive strengthening program, continue walking   Consulted and Agree with Plan of Care Patient      Patient will benefit from skilled therapeutic intervention in order to improve the following deficits and impairments:  Postural dysfunction, Decreased strength, Decreased range of motion  Visit Diagnosis: Lymphedema, not elsewhere classified - Plan: PT plan of care cert/re-cert     Problem List Patient Active Problem List   Diagnosis Date Noted  . Pancytopenia, acquired ([place]) 11/21/2016  . Anemia due to antineoplastic chemotherapy 06/27/2016  . Other constipation 05/31/2016  . Cancer associated pain 05/18/2016  . Loose, teeth 05/15/2016  . Chronic periodontitis 05/15/2016  . Exostoses, multiple 05/15/2016  . 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 Dental caries 05/15/2016  . 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 Retained dental root 05/15/2016  . Malignant neoplasm of supraglottis ([place]) 05/10/2016  . Status post tracheostomy ([place]) 04/24/2016  . Hoarseness 11/21/2015  . Tooth decay 06/03/2015  . History of colonic polyps 03/08/2015  . Weight loss 01/09/2014  . Esophageal reflux 09/12/2013  . Allergic rhinitis 09/12/2013    SALISBURY,DONNA 12/26/2016, 9:58 AM  [place], [place], [address] Phone: [phone]   Fax:  [phone]  Name: Jonathan [name] MRN: [id number] Date of Birth: 04/19/1960  Serafina Royals, PT 12/26/16 9:58 AM  PHYSICAL THERAPY DISCHARGE SUMMARY  Visits from Start of Care: 1  Current functional level related to goals / functional outcomes: Unknown: pt. Did not come to follow-up treatment visits after this evaluation.   Remaining deficits: Unknown   Education / Equipment: Issued and educated pt. About use of foam chip pack for neck compression to minimize swelling.  Plan: Patient agrees to discharge.  Patient goals were not met. Patient is being discharged due to not returning since the last visit.  ?????    Serafina Royals, PT 06/12/17 11:29 AM

## 2016-12-27 NOTE — Progress Notes (Signed)
Oncology Nurse Navigator Documentation  Met with Jonathan [name] during H&N [facility].    Provided verbal and written overview of [facility], the clinicians who will be seeing him, encouraged him to ask questions during time with them.  He was seen by SLP for post-treatment follow-up.  Rick Diehl, RN, BSN, CHPN Head & Neck Oncology Navigator [facility] at [place] [phone]    

## 2016-12-29 ENCOUNTER — Other Ambulatory Visit: Payer: Self-pay | Admitting: Hematology and Oncology

## 2016-12-29 DIAGNOSIS — C321 Malignant neoplasm of supraglottis: Secondary | ICD-10-CM

## 2017-01-01 ENCOUNTER — Encounter (HOSPITAL_COMMUNITY): Payer: Self-pay

## 2017-01-01 ENCOUNTER — Encounter (HOSPITAL_COMMUNITY): Payer: Medicaid Other

## 2017-01-01 ENCOUNTER — Ambulatory Visit (HOSPITAL_COMMUNITY)
Admission: RE | Admit: 2017-01-01 | Discharge: 2017-01-01 | Disposition: A | Discharge status: Home / Self Care | Payer: Medicaid Other | Source: Ambulatory Visit | Attending: Hematology and Oncology | Admitting: Hematology and Oncology

## 2017-01-01 ENCOUNTER — Telehealth: Payer: Self-pay

## 2017-01-01 DIAGNOSIS — C321 Malignant neoplasm of supraglottis: Secondary | ICD-10-CM | POA: Insufficient documentation

## 2017-01-01 DIAGNOSIS — Z930 Tracheostomy status: Secondary | ICD-10-CM | POA: Diagnosis not present

## 2017-01-01 MED ORDER — IOPAMIDOL (ISOVUE-300) INJECTION 61%
75.0000 mL | Freq: Once | INTRAVENOUS | Status: AC | PRN
  Administered 2017-01-01: 75 mL via INTRAVENOUS

## 2017-01-01 MED ORDER — IOPAMIDOL (ISOVUE-300) INJECTION 61%
INTRAVENOUS | Status: AC
  Filled 2017-01-01: qty 75

## 2017-01-01 NOTE — Telephone Encounter (Signed)
-----   Message from Heath Lark, MD sent at 01/01/2017  9:05 AM EDT ----- Regarding: call Dr. Constance Holster ENT Pls call his office to schedule return visit to see Dr. Constance Holster Patient has completed all treatment. Maybe the trach can be removed

## 2017-01-01 NOTE — Telephone Encounter (Signed)
Called with below message. Scheduler to call and schedule appt.

## 2017-01-04 ENCOUNTER — Ambulatory Visit (HOSPITAL_BASED_OUTPATIENT_CLINIC_OR_DEPARTMENT_OTHER): Payer: Medicaid Other | Admitting: Hematology and Oncology

## 2017-01-04 ENCOUNTER — Other Ambulatory Visit (HOSPITAL_BASED_OUTPATIENT_CLINIC_OR_DEPARTMENT_OTHER): Payer: Medicaid Other

## 2017-01-04 ENCOUNTER — Telehealth: Payer: Self-pay | Admitting: Hematology and Oncology

## 2017-01-04 ENCOUNTER — Ambulatory Visit (HOSPITAL_BASED_OUTPATIENT_CLINIC_OR_DEPARTMENT_OTHER): Payer: Medicaid Other

## 2017-01-04 DIAGNOSIS — D6481 Anemia due to antineoplastic chemotherapy: Secondary | ICD-10-CM

## 2017-01-04 DIAGNOSIS — C321 Malignant neoplasm of supraglottis: Secondary | ICD-10-CM

## 2017-01-04 DIAGNOSIS — Z930 Tracheostomy status: Secondary | ICD-10-CM

## 2017-01-04 DIAGNOSIS — T451X5A Adverse effect of antineoplastic and immunosuppressive drugs, initial encounter: Secondary | ICD-10-CM

## 2017-01-04 LAB — COMPREHENSIVE METABOLIC PANEL
ALBUMIN: 3.8 g/dL (ref 3.5–5.0)
ALT: 12 U/L (ref 0–55)
ANION GAP: 8 meq/L (ref 3–11)
AST: 18 U/L (ref 5–34)
Alkaline Phosphatase: 67 U/L (ref 40–150)
BILIRUBIN TOTAL: 0.23 mg/dL (ref 0.20–1.20)
BUN: 12.2 mg/dL (ref 7.0–26.0)
CALCIUM: 9.8 mg/dL (ref 8.4–10.4)
CO2: 27 meq/L (ref 22–29)
CREATININE: 0.8 mg/dL (ref 0.7–1.3)
Chloride: 106 mEq/L (ref 98–109)
EGFR: >90 mL/min/{1.73_m2} (ref >90)
GLUCOSE: 93 mg/dL (ref 70–140)
Potassium: 4.1 mEq/L (ref 3.5–5.1)
Sodium: 141 mEq/L (ref 136–145)
TOTAL PROTEIN: 6.9 g/dL (ref 6.4–8.3)

## 2017-01-04 LAB — CBC WITH DIFFERENTIAL/PLATELET
BASO%: 1.2 % (ref 0.0–2.0)
Basophils Absolute: 0 10*3/uL (ref 0.0–0.1)
EOS ABS: 0.4 10*3/uL (ref 0.0–0.5)
EOS%: 9.9 % — ABNORMAL HIGH (ref 0.0–7.0)
HEMATOCRIT: 35.7 % — AB (ref 38.4–49.9)
HEMOGLOBIN: 11.9 g/dL — AB (ref 13.0–17.1)
LYMPH#: 0.5 10*3/uL — AB (ref 0.9–3.3)
LYMPH%: 11.8 % — ABNORMAL LOW (ref 14.0–49.0)
MCH: 30.2 pg (ref 27.2–33.4)
MCHC: 33.4 g/dL (ref 32.0–36.0)
MCV: 90.5 fL (ref 79.3–98.0)
MONO#: 0.5 10*3/uL (ref 0.1–0.9)
MONO%: 12.7 % (ref 0.0–14.0)
NEUT%: 64.4 % (ref 39.0–75.0)
NEUTROS ABS: 2.6 10*3/uL (ref 1.5–6.5)
PLATELETS: 265 10*3/uL (ref 140–400)
RBC: 3.94 10*6/uL — ABNORMAL LOW (ref 4.20–5.82)
RDW: 16.1 % — ABNORMAL HIGH (ref 11.0–14.6)
WBC: 4.1 10*3/uL (ref 4.0–10.3)

## 2017-01-04 MED ORDER — HEPARIN SOD (PORK) LOCK FLUSH 100 UNIT/ML IV SOLN
500.0000 [IU] | Freq: Once | INTRAVENOUS | Status: AC | PRN
  Administered 2017-01-04: 500 [IU]
  Filled 2017-01-04: qty 5

## 2017-01-04 MED ORDER — SODIUM CHLORIDE 0.9% FLUSH
10.0000 mL | INTRAVENOUS | Status: DC | PRN
  Administered 2017-01-04: 10 mL
  Filled 2017-01-04: qty 10

## 2017-01-04 NOTE — Telephone Encounter (Signed)
Scheduled appt per 10/4 los - Gave patient AVS and calender per los.  

## 2017-01-05 ENCOUNTER — Encounter: Payer: Self-pay | Admitting: Hematology and Oncology

## 2017-01-05 NOTE — Assessment & Plan Note (Signed)
The tracheostomy site looks good without signs of bleeding or infection The plan is to keep the tracheostomy for now until ENT consult

## 2017-01-05 NOTE — Assessment & Plan Note (Signed)
Clinically, he is doing well He is gaining weight He denies recent hemoptysis CT imaging show excellent response to treatment Unfortunately, his insurance had declined PET scan I have refer him back to ENT for further evaluation and consideration for removal of tracheostomy I will see him back in 3 months for further assessment

## 2017-01-05 NOTE — Progress Notes (Signed)
[place] OFFICE PROGRESS NOTE  Patient Care Team: Velna Ochs, MD as PCP - General Leota Sauers, RN as Oncology Nurse Navigator Eppie Gibson, MD as Attending Physician (Radiation Oncology) Karie Mainland, RD as Dietitian (Nutrition)  SUMMARY OF ONCOLOGIC HISTORY:   Malignant neoplasm of supraglottis ([place])   04/17/2016 Imaging    Ct neck showed relatively large approximately 3.5 cm laryngeal mass most compatible with laryngeal carcinoma. Epicenter at the glottis on the right. Extension across midline as well as involvement of laryngeal cartilages (possibly also bilateral). 2. Relatively small but malignant appearing right level 2 lymphadenopathy. Suspicious contralateral left level 2 lymph nodes.      04/21/2016 Initial Diagnosis    Jonathan [name] saw Dr. Constance Holster in the clinic for chronic hoarseness. In the office, it was noted that the posterior soft palate, uvula, tongue base and vallecula were visualized and appeared healthy without mucosal masses or lesions. There is a large mass involving the right side of the larynx, the right cord is fixed. There may be involvement of the post cricoid area it is difficult to say for sure. 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 Clinically this is at least staged as T3. The airway is significantly impaired. Jonathan [name] is at risk for impending airway obstruction. ENT recommended him for awake tracheostomy with direct laryngoscopy and biopsy and esophagoscopy under anesthesia immediately following that.       04/24/2016 Pathology Results    Larynx, biopsy, Right Side - POSITIVE FOR INVASIVE SQUAMOUS CELL CARCINOMA. - SEE COMMENT. Microscopic Comment As sampled, the squamous cell carcinoma appears well differentiated. A fragment of benign cartilage is also present in the specimen.      04/24/2016 Surgery    She underwent direct laryngoscopy with biopsy. An anterior commissure scope was used to evaluate  the larynx. A large mass and, using the right side of the larynx and the anterior commissure area was identified. Multiple biopsies were taken. The left cord was swollen but seemed to be uninvolved. Piriforms appeared to be clear. The post cricoid area appeared clear. Esophagoscopy was difficult and ENT surgeon was unable to pass the scope into the esophagus. Jonathan [name] had tracheostomy      04/24/2016 Pathology Results    Larynx, biopsy, Right Side - POSITIVE FOR INVASIVE SQUAMOUS CELL CARCINOMA. - SEE COMMENT. Microscopic Comment As sampled, the squamous cell carcinoma appears well differentiated. A fragment of benign cartilage is also present in the specimen.       05/15/2016 PET scan    Right eccentric glottic mass, appears to cross the anterior commissure with destructive findings of the thyroid cartilage. There is an upper normal size right station 2A lymph node with maximum SUV of 4.2 which is only very faintly elevated over background, but which could possibly represent a small amount of local metastatic spread. 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 2. Asymmetric calcification along the head of the right caudate nucleus and adjacent lentiform nucleus. Well this can sometimes be physiologic, there is some accompanying reduction in PET activity, and the asymmetry is an unusual feature. Accordingly I recommend MRI of the brain with and without contrast for further characterization. 3. A AP window lymph node is minimally enlarged at 1.1 cm, but with a maximum SUV below background mediastinal levels, likely benign.      05/23/2016 Imaging    MRI brain:  Atrophy with chronic microvascular ischemic change. No intracranial metastatic disease. 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        05/26/2016 Surgery    Multiple extraction of tooth numbers 2, 5, 6, 8-13, and  21-28. 2. 4 Quadrants of alveoloplasty. 3. Bilateral mandibular lingual tori reductions 4. Maxillary right, maxillary left, and mandibular left buccal exostoses reductions      06/02/2016 Procedure    Successful placement of a right internal jugular approach power injectable Port-A-Cath. The catheter is ready for immediate use      06/09/2016 - 08/22/2016 Chemotherapy    Jonathan [name] received induction chemotherapy with taxotere, cisplatin and 5 FU x 4 cycles      07/31/2016 PET scan    Mild asymmetric soft tissue prominence along the right supraglottic larynx, without hypermetabolism, likely corresponding to treated tumor. No findings suspicious for metastatic disease. Small mediastinal and left axillary nodes, without associated hypermetabolism.      09/11/2016 - 10/30/2016 Radiation Therapy    Jonathan [name] received radiation Radiation treatment dates:   09/11/16-10/30/16  Site/dose:   Larynx and b/l neck, 70 Gy in 35 fractions  Beams/energy:   IMRT, 6X       01/01/2017 Imaging    CT neck: Asymmetric soft tissue prominence in the right supraglottic region extending down to the glottis on the right. This appears similar to the previous PET scan at which time there was not any hypermetabolism in the region. Therefore, this could be chronic post treatment change. However, there does appear to be some enhancing tissue and I cannot exclude the possibility of residual or recurrent tumor on the basis of this exam. There is no evidence of any nodal disease.       INTERVAL HISTORY: Please see below for problem oriented charting. Jonathan [name] returns to review imaging study report Jonathan [name] is doing well.  Denies pain.  Jonathan [name] started eating well.  Jonathan [name] had no recent weight loss, in fact Jonathan [name] has gained weight No recent hemoptysis Jonathan [name] denies recent smoking or alcohol intake  Most of the symptoms or side effects from prior treatment had resolved  REVIEW  OF SYSTEMS:   Constitutional: Denies fevers, chills or abnormal weight loss Eyes: Denies blurriness of vision Ears, nose, mouth, throat, and face: Denies mucositis or sore throat Respiratory: Denies cough, dyspnea or wheezes Cardiovascular: Denies palpitation, chest discomfort or lower extremity swelling Gastrointestinal:  Denies nausea, heartburn or change in bowel habits Skin: Denies abnormal skin rashes Lymphatics: Denies new lymphadenopathy or easy bruising Neurological:Denies numbness, tingling or new weaknesses Behavioral/Psych: Mood is stable, no new changes  All other systems were reviewed with the patient and are negative.  I have reviewed the past medical history, past surgical history, social history and family history with the patient and they are unchanged from previous note.  ALLERGIES:  is allergic to no known allergies.  MEDICATIONS:  Current Outpatient Prescriptions  Medication Sig Dispense Refill  . lidocaine (XYLOCAINE) 2 % solution Patient: Mix 1part 2% viscous lidocaine, 1part H20. Swish & swallow 40mL of diluted mixture, 28min before meals and at bedtime, up to QID (Patient not taking: Reported on 12/26/2016) 100 mL 5  . LORazepam (ATIVAN) 0.5 MG tablet Take 1-2 tablets 30 min before radiotherapy to help with claustrophobia. 40 tablet 0  . senna (SENOKOT) 8.6 MG tablet Take 1 tablet (8.6 mg total) by mouth 2 (two) times daily. 60 tablet 9  No current facility-administered medications for this visit.     PHYSICAL EXAMINATION: ECOG PERFORMANCE STATUS: 0 - Asymptomatic  Vitals:   01/04/17 1501  BP: 107/79  Pulse: 88  Resp: 18  Temp: 98.8 F (37.1 C)  SpO2: 100%   Filed Weights   01/04/17 1501  Weight: 119 lb 4.8 oz (54.1 kg)    GENERAL:alert, no distress and comfortable SKIN: skin color, texture, turgor are normal, no rashes or significant lesions EYES: normal, Conjunctiva are pink and non-injected, sclera clear OROPHARYNX:no exudate, no erythema and  lips, buccal mucosa, and tongue normal  NECK: tracheostomy site looks okay  lYMPH:  no palpable lymphadenopathy in the cervical, axillary or inguinal LUNGS: clear to auscultation and percussion with normal breathing effort HEART: regular rate & rhythm and no murmurs and no lower extremity edema ABDOMEN:abdomen soft, non-tender and normal bowel sounds Musculoskeletal:no cyanosis of digits and no clubbing  NEURO: alert & oriented x 3 with fluent speech, no focal motor/sensory deficits  LABORATORY DATA:  I have reviewed the data as listed    Component Value Date/Time   NA 141 01/04/2017 1432   K 4.1 01/04/2017 1432   CL 101 06/02/2016 1237   CO2 27 01/04/2017 1432   GLUCOSE 93 01/04/2017 1432   BUN 12.2 01/04/2017 1432   CREATININE 0.8 01/04/2017 1432   CALCIUM 9.8 01/04/2017 1432   PROT 6.9 01/04/2017 1432   ALBUMIN 3.8 01/04/2017 1432   AST 18 01/04/2017 1432   ALT 12 01/04/2017 1432   ALKPHOS 67 01/04/2017 1432   BILITOT 0.23 01/04/2017 1432   GFRNONAA >60 06/02/2016 1237   GFRNONAA 82 03/06/2011 0853   GFRAA >60 06/02/2016 1237   GFRAA >89 03/06/2011 0853    No results found for: SPEP, UPEP  Lab Results  Component Value Date   WBC 4.1 01/04/2017   NEUTROABS 2.6 01/04/2017   HGB 11.9 (L) 01/04/2017   HCT 35.7 (L) 01/04/2017   MCV 90.5 01/04/2017   PLT 265 01/04/2017      Chemistry      Component Value Date/Time   NA 141 01/04/2017 1432   K 4.1 01/04/2017 1432   CL 101 06/02/2016 1237   CO2 27 01/04/2017 1432   BUN 12.2 01/04/2017 1432   CREATININE 0.8 01/04/2017 1432      Component Value Date/Time   CALCIUM 9.8 01/04/2017 1432   ALKPHOS 67 01/04/2017 1432   AST 18 01/04/2017 1432   ALT 12 01/04/2017 1432   BILITOT 0.23 01/04/2017 1432       RADIOGRAPHIC STUDIES: I have personally reviewed the radiological images as listed and agreed with the findings in the report. Ct Soft Tissue Neck W Contrast  Result Date: 01/01/2017 CLINICAL DATA:  Supraglottic  carcinoma with tracheostomy. Treated with chemotherapy and radiation. EXAM: CT NECK WITH CONTRAST TECHNIQUE: Multidetector CT imaging of the neck was performed using the standard protocol following the bolus administration of intravenous contrast. CONTRAST:  51mL ISOVUE-300 IOPAMIDOL (ISOVUE-300) INJECTION 61% COMPARISON:  CT 04/17/2016.  PET scans 05/15/2016 and 07/31/2016. FINDINGS: Pharynx and larynx: There is continued asymmetric prominence of the soft tissue in the right supraglottic region extending down to the actual glottis. Comparing to the previous PET scan, this has a similar appearance. Previous PET scan did not show hypermetabolism at that time. On today's study, this appears to be possibly due to post treatment edema, but there is some enhancing tissue density material that I can not exclude representing residual or recurrent tumor. Arachnoid cartilages are  markedly distorted, which could also result in some anatomic distortion in this region. There is progressive sclerosis of the right side of the cricoid. No sign of actual obstruction. Salivary glands: Parotid and submandibular glands appear normal. Thyroid: Normal Lymph nodes: No enlarged or low-density nodes on either side of the neck. Vascular: Vascular structures remain patent. Medially deviated left ICA as noted previously. Limited intracranial: Negative Visualized orbits: Negative Mastoids and visualized paranasal sinuses: Clear Skeleton: Negative Upper chest: Clear Other: None IMPRESSION: Asymmetric soft tissue prominence in the right supraglottic region extending down to the glottis on the right. This appears similar to the previous PET scan at which time there was not any hypermetabolism in the region. Therefore, this could be chronic post treatment change. However, there does appear to be some enhancing tissue and I cannot exclude the possibility of residual or recurrent tumor on the basis of this exam. There is no evidence of any nodal  disease. Electronically Signed   By: Nelson Chimes M.D.   On: 01/01/2017 08:58    ASSESSMENT & PLAN:  Malignant neoplasm of supraglottis (Jonathan) Clinically, Jonathan [name] is doing well Jonathan [name] is gaining weight Jonathan [name] denies recent hemoptysis CT imaging show excellent response to treatment Unfortunately, his insurance had declined PET scan I have refer him back to ENT for further evaluation and consideration for removal of tracheostomy I will see him back in 3 months for further assessment  Status post tracheostomy [facility]) The tracheostomy site looks good without signs of bleeding or infection The plan is to keep the tracheostomy for now until ENT consult  Anemia due to antineoplastic chemotherapy Anemia is improving. Observe only.    No orders of the defined types were placed in this encounter.  All questions were answered. The patient knows to call the clinic with any problems, questions or concerns. No barriers to learning was detected. I spent 15 minutes counseling the patient face to face. The total time spent in the appointment was 20 minutes and more than 50% was on counseling and review of test results     Jonathan Lark, MD 01/05/2017 2:23 PM

## 2017-01-05 NOTE — Assessment & Plan Note (Signed)
Anemia is improving. Observe only.

## 2017-01-10 ENCOUNTER — Encounter: Payer: Self-pay | Admitting: *Deleted

## 2017-01-10 NOTE — Progress Notes (Signed)
Oncology Nurse Navigator Documentation  Jonathan [name] and his wife attended the Fall 2018 H&N [facility] survivorship series, 5 consecutive Tuesday evenings 6:00-7:15 pm beginning 12/12/16.  [name], RN, BSN, [place]ck Oncology Nurse [place] at [place] [phone]

## 2017-02-01 DIAGNOSIS — Z930 Tracheostomy status: Secondary | ICD-10-CM | POA: Diagnosis not present

## 2017-02-15 ENCOUNTER — Ambulatory Visit (HOSPITAL_BASED_OUTPATIENT_CLINIC_OR_DEPARTMENT_OTHER): Payer: Medicaid Other

## 2017-02-15 DIAGNOSIS — C321 Malignant neoplasm of supraglottis: Secondary | ICD-10-CM

## 2017-02-15 DIAGNOSIS — Z452 Encounter for adjustment and management of vascular access device: Secondary | ICD-10-CM | POA: Diagnosis present

## 2017-02-15 MED ORDER — HEPARIN SOD (PORK) LOCK FLUSH 100 UNIT/ML IV SOLN
500.0000 [IU] | Freq: Once | INTRAVENOUS | Status: AC | PRN
  Administered 2017-02-15: 500 [IU]
  Filled 2017-02-15: qty 5

## 2017-02-15 MED ORDER — SODIUM CHLORIDE 0.9% FLUSH
10.0000 mL | INTRAVENOUS | Status: DC | PRN
  Administered 2017-02-15: 10 mL
  Filled 2017-02-15: qty 10

## 2017-02-15 NOTE — Patient Instructions (Signed)

## 2017-03-02 ENCOUNTER — Encounter: Payer: Self-pay | Admitting: Internal Medicine

## 2017-04-12 ENCOUNTER — Inpatient Hospital Stay: Payer: Medicaid Other

## 2017-04-12 ENCOUNTER — Inpatient Hospital Stay: Payer: Medicaid Other | Attending: Hematology and Oncology | Admitting: Hematology and Oncology

## 2017-04-12 ENCOUNTER — Telehealth: Payer: Self-pay | Admitting: Hematology and Oncology

## 2017-04-12 VITALS — BP 138/99 | HR 81 | Temp 98.4°F | Resp 18 | Ht 65.5 in | Wt 123.1 lb

## 2017-04-12 DIAGNOSIS — Z23 Encounter for immunization: Secondary | ICD-10-CM | POA: Diagnosis not present

## 2017-04-12 DIAGNOSIS — Z930 Tracheostomy status: Secondary | ICD-10-CM | POA: Insufficient documentation

## 2017-04-12 DIAGNOSIS — C321 Malignant neoplasm of supraglottis: Secondary | ICD-10-CM

## 2017-04-12 DIAGNOSIS — Z299 Encounter for prophylactic measures, unspecified: Secondary | ICD-10-CM

## 2017-04-12 DIAGNOSIS — Z452 Encounter for adjustment and management of vascular access device: Secondary | ICD-10-CM | POA: Insufficient documentation

## 2017-04-12 LAB — CBC WITH DIFFERENTIAL/PLATELET
BASOS ABS: 0 10*3/uL (ref 0.0–0.1)
Basophils Relative: 1 %
EOS PCT: 4 %
Eosinophils Absolute: 0.2 10*3/uL (ref 0.0–0.5)
HEMATOCRIT: 40.8 % (ref 38.4–49.9)
Hemoglobin: 13.5 g/dL (ref 13.0–17.1)
LYMPHS ABS: 0.6 10*3/uL — AB (ref 0.9–3.3)
LYMPHS PCT: 14 %
MCH: 30.9 pg (ref 27.2–33.4)
MCHC: 33 g/dL (ref 32.0–36.0)
MCV: 93.6 fL (ref 79.3–98.0)
MONO ABS: 0.5 10*3/uL (ref 0.1–0.9)
MONOS PCT: 12 %
NEUTROS ABS: 2.9 10*3/uL (ref 1.5–6.5)
Neutrophils Relative %: 69 %
Platelets: 269 10*3/uL (ref 140–400)
RBC: 4.35 MIL/uL (ref 4.20–5.82)
RDW: 15.5 % (ref 11.0–15.6)
WBC: 4.2 10*3/uL (ref 4.0–10.3)

## 2017-04-12 LAB — COMPREHENSIVE METABOLIC PANEL
ALT: 11 U/L (ref 0–55)
AST: 16 U/L (ref 5–34)
Albumin: 4.2 g/dL (ref 3.5–5.0)
Alkaline Phosphatase: 81 U/L (ref 40–150)
Anion gap: 8 (ref 3–11)
BILIRUBIN TOTAL: 0.4 mg/dL (ref 0.2–1.2)
BUN: 12 mg/dL (ref 7–26)
CO2: 26 mmol/L (ref 22–29)
CREATININE: 0.95 mg/dL (ref 0.70–1.30)
Calcium: 9.5 mg/dL (ref 8.4–10.4)
Chloride: 106 mmol/L (ref 98–109)
Glucose, Bld: 89 mg/dL (ref 70–140)
POTASSIUM: 3.9 mmol/L (ref 3.5–5.1)
Sodium: 140 mmol/L (ref 136–145)
TOTAL PROTEIN: 7.7 g/dL (ref 6.4–8.3)

## 2017-04-12 MED ORDER — INFLUENZA VAC SPLIT QUAD 0.5 ML IM SUSY
0.5000 mL | PREFILLED_SYRINGE | Freq: Once | INTRAMUSCULAR | Status: AC
  Administered 2017-04-12: 0.5 mL via INTRAMUSCULAR
  Filled 2017-04-12: qty 0.5

## 2017-04-12 MED ORDER — SODIUM CHLORIDE 0.9% FLUSH
10.0000 mL | Freq: Once | INTRAVENOUS | Status: AC
  Administered 2017-04-12: 10 mL
  Filled 2017-04-12: qty 10

## 2017-04-12 MED ORDER — HEPARIN SOD (PORK) LOCK FLUSH 100 UNIT/ML IV SOLN
500.0000 [IU] | Freq: Once | INTRAVENOUS | Status: AC
  Administered 2017-04-12: 500 [IU]
  Filled 2017-04-12: qty 5

## 2017-04-12 NOTE — Telephone Encounter (Signed)
Gave patient avs and calendar with appts per 1/10 los °

## 2017-04-12 NOTE — Patient Instructions (Signed)

## 2017-04-13 ENCOUNTER — Encounter: Payer: Self-pay | Admitting: Hematology and Oncology

## 2017-04-13 DIAGNOSIS — Z299 Encounter for prophylactic measures, unspecified: Secondary | ICD-10-CM | POA: Insufficient documentation

## 2017-04-13 DIAGNOSIS — Z23 Encounter for immunization: Secondary | ICD-10-CM | POA: Insufficient documentation

## 2017-04-13 NOTE — Assessment & Plan Note (Signed)
Clinically, he is doing well He is gaining weight He denies recent hemoptysis Last CT imaging showed excellent response to treatment Unfortunately, his insurance had declined PET scan I will wait until his ENT doctor felt it is appropriate to for the tracheostomy removal After that, I plan to repeat CT scan of the neck to make sure he had no residual disease before removing the port I will see him back in 3 months for further assessment

## 2017-04-13 NOTE — Progress Notes (Signed)
[place] OFFICE PROGRESS NOTE  Patient Care Team: Velna Ochs, MD as PCP - General Eppie Gibson, MD as Attending Physician (Radiation Oncology) Karie Mainland, RD as Dietitian (Nutrition)  SUMMARY OF ONCOLOGIC HISTORY:   Malignant neoplasm of supraglottis ([place])   04/17/2016 Imaging    Ct neck showed relatively large approximately 3.5 cm laryngeal mass most compatible with laryngeal carcinoma. Epicenter at the glottis on the right. Extension across midline as well as involvement of laryngeal cartilages (possibly also bilateral). 2. Relatively small but malignant appearing right level 2 lymphadenopathy. Suspicious contralateral left level 2 lymph nodes.      04/21/2016 Initial Diagnosis    He saw Dr. Constance Holster in the clinic for chronic hoarseness. In the office, it was noted that the posterior soft palate, uvula, tongue base and vallecula were visualized and appeared healthy without mucosal masses or lesions. There is a large mass involving the right side of the larynx, the right cord is fixed. There may be involvement of the post cricoid area it is difficult to say for sure. 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 Clinically this is at least staged as T3. The airway is significantly impaired. He is at risk for impending airway obstruction. ENT recommended him for awake tracheostomy with direct laryngoscopy and biopsy and esophagoscopy under anesthesia immediately following that.       04/24/2016 Pathology Results    Larynx, biopsy, Right Side - POSITIVE FOR INVASIVE SQUAMOUS CELL CARCINOMA. - SEE COMMENT. Microscopic Comment As sampled, the squamous cell carcinoma appears well differentiated. A fragment of benign cartilage is also present in the specimen.      04/24/2016 Surgery    She underwent direct laryngoscopy with biopsy. An anterior commissure scope was used to evaluate the larynx. A large mass and, using the right  side of the larynx and the anterior commissure area was identified. Multiple biopsies were taken. The left cord was swollen but seemed to be uninvolved. Piriforms appeared to be clear. The post cricoid area appeared clear. Esophagoscopy was difficult and ENT surgeon was unable to pass the scope into the esophagus. He had tracheostomy      04/24/2016 Pathology Results    Larynx, biopsy, Right Side - POSITIVE FOR INVASIVE SQUAMOUS CELL CARCINOMA. - SEE COMMENT. Microscopic Comment As sampled, the squamous cell carcinoma appears well differentiated. A fragment of benign cartilage is also present in the specimen.       05/15/2016 PET scan    Right eccentric glottic mass, appears to cross the anterior commissure with destructive findings of the thyroid cartilage. There is an upper normal size right station 2A lymph node with maximum SUV of 4.2 which is only very faintly elevated over background, but which could possibly represent a small amount of local metastatic spread. 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 2. Asymmetric calcification along the head of the right caudate nucleus and adjacent lentiform nucleus. Well this can sometimes be physiologic, there is some accompanying reduction in PET activity, and the asymmetry is an unusual feature. Accordingly I recommend MRI of the brain with and without contrast for further characterization. 3. A AP window lymph node is minimally enlarged at 1.1 cm, but with a maximum SUV below background mediastinal levels, likely benign.      05/23/2016 Imaging    MRI brain: Atrophy with chronic microvascular ischemic change. No intracranial  metastatic disease. 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       05/26/2016 Surgery    Multiple  extraction of tooth numbers 2, 5, 6, 8-13, and  21-28. 2. 4 Quadrants of alveoloplasty. 3. Bilateral mandibular lingual tori reductions 4. Maxillary right, maxillary left, and mandibular left buccal exostoses reductions      06/02/2016 Procedure    Successful placement of a right internal jugular approach power injectable Port-A-Cath. The catheter is ready for immediate use      06/09/2016 - 08/22/2016 Chemotherapy    He received induction chemotherapy with taxotere, cisplatin and 5 FU x 4 cycles      07/31/2016 PET scan    Mild asymmetric soft tissue prominence along the right supraglottic larynx, without hypermetabolism, likely corresponding to treated tumor. No findings suspicious for metastatic disease. Small mediastinal and left axillary nodes, without associated hypermetabolism.      09/11/2016 - 10/30/2016 Radiation Therapy    He received radiation Radiation treatment dates:   09/11/16-10/30/16  Site/dose:   Larynx and b/l neck, 70 Gy in 35 fractions  Beams/energy:   IMRT, 6X       01/01/2017 Imaging    CT neck: Asymmetric soft tissue prominence in the right supraglottic region extending down to the glottis on the right. This appears similar to the previous PET scan at which time there was not any hypermetabolism in the region. Therefore, this could be chronic post treatment change. However, there does appear to be some enhancing tissue and I cannot exclude the possibility of residual or recurrent tumor on the basis of this exam. There is no evidence of any nodal disease.       INTERVAL HISTORY: Please see below for problem oriented charting. He returns with his girlfriend for further management He is doing well and gaining significant pain at the tracheostomy site No recent infection The recent tracheostomy size has been downsized per ENT with the plan for removal in the near future He denies new lymphadenopathy Denies recent smoking or drinking  REVIEW OF SYSTEMS:    Constitutional: Denies fevers, chills or abnormal weight loss Eyes: Denies blurriness of vision Ears, nose, mouth, throat, and face: Denies mucositis or sore throat Respiratory: Denies cough, dyspnea or wheezes Cardiovascular: Denies palpitation, chest discomfort or lower extremity swelling Gastrointestinal:  Denies nausea, heartburn or change in bowel habits Skin: Denies abnormal skin rashes Lymphatics: Denies new lymphadenopathy or easy bruising Neurological:Denies numbness, tingling or new weaknesses Behavioral/Psych: Mood is stable, no new changes  All other systems were reviewed with the patient and are negative.  I have reviewed the past medical history, past surgical history, social history and family history with the patient and they are unchanged from previous note.  ALLERGIES:  is allergic to no known allergies.  MEDICATIONS:  Current Outpatient Medications  Medication Sig Dispense Refill  . lidocaine (XYLOCAINE) 2 % solution Patient: Mix 1part 2% viscous lidocaine, 1part H20. Swish & swallow 22mL of diluted mixture, 21min before meals and at bedtime, up to QID (Patient not taking: Reported on 12/26/2016) 100 mL 5  . LORazepam (ATIVAN) 0.5 MG tablet Take 1-2 tablets 30 min before radiotherapy to help with claustrophobia. 40 tablet 0  . senna (SENOKOT) 8.6 MG tablet Take 1 tablet (8.6 mg total) by mouth 2 (two) times daily. 60 tablet 9   No current facility-administered medications for this visit.  PHYSICAL EXAMINATION: ECOG PERFORMANCE STATUS: 0 - Asymptomatic  Vitals:   04/12/17 1438  BP: (!) 138/99  Pulse: 81  Resp: 18  Temp: 98.4 F (36.9 C)  SpO2: 97%   Filed Weights   04/12/17 1438  Weight: 123 lb 1.6 oz (55.8 kg)    GENERAL:alert, no distress and comfortable SKIN: skin color, texture, turgor are normal, no rashes or significant lesions EYES: normal, Conjunctiva are pink and non-injected, sclera clear OROPHARYNX:no exudate, no erythema and lips, buccal  mucosa, and tongue normal  NECK: supple, thyroid normal size, non-tender, without nodularity LYMPH: Tracheostomy site looks okay without signs of infection  lUNGS: clear to auscultation and percussion with normal breathing effort HEART: regular rate & rhythm and no murmurs and no lower extremity edema ABDOMEN:abdomen soft, non-tender and normal bowel sounds Musculoskeletal:no cyanosis of digits and no clubbing  NEURO: alert & oriented x 3 with fluent speech, no focal motor/sensory deficits  LABORATORY DATA:  I have reviewed the data as listed    Component Value Date/Time   NA 140 04/12/2017 1405   NA 141 01/04/2017 1432   K 3.9 04/12/2017 1405   K 4.1 01/04/2017 1432   CL 106 04/12/2017 1405   CO2 26 04/12/2017 1405   CO2 27 01/04/2017 1432   GLUCOSE 89 04/12/2017 1405   GLUCOSE 93 01/04/2017 1432   BUN 12 04/12/2017 1405   BUN 12.2 01/04/2017 1432   CREATININE 0.95 04/12/2017 1405   CREATININE 0.8 01/04/2017 1432   CALCIUM 9.5 04/12/2017 1405   CALCIUM 9.8 01/04/2017 1432   PROT 7.7 04/12/2017 1405   PROT 6.9 01/04/2017 1432   ALBUMIN 4.2 04/12/2017 1405   ALBUMIN 3.8 01/04/2017 1432   AST 16 04/12/2017 1405   AST 18 01/04/2017 1432   ALT 11 04/12/2017 1405   ALT 12 01/04/2017 1432   ALKPHOS 81 04/12/2017 1405   ALKPHOS 67 01/04/2017 1432   BILITOT 0.4 04/12/2017 1405   BILITOT 0.23 01/04/2017 1432   GFRNONAA >60 04/12/2017 1405   GFRNONAA 82 03/06/2011 0853   GFRAA >60 04/12/2017 1405   GFRAA >89 03/06/2011 0853    No results found for: SPEP, UPEP  Lab Results  Component Value Date   WBC 4.2 04/12/2017   NEUTROABS 2.9 04/12/2017   HGB 13.5 04/12/2017   HCT 40.8 04/12/2017   MCV 93.6 04/12/2017   PLT 269 04/12/2017      Chemistry      Component Value Date/Time   NA 140 04/12/2017 1405   NA 141 01/04/2017 1432   K 3.9 04/12/2017 1405   K 4.1 01/04/2017 1432   CL 106 04/12/2017 1405   CO2 26 04/12/2017 1405   CO2 27 01/04/2017 1432   BUN 12  04/12/2017 1405   BUN 12.2 01/04/2017 1432   CREATININE 0.95 04/12/2017 1405   CREATININE 0.8 01/04/2017 1432      Component Value Date/Time   CALCIUM 9.5 04/12/2017 1405   CALCIUM 9.8 01/04/2017 1432   ALKPHOS 81 04/12/2017 1405   ALKPHOS 67 01/04/2017 1432   AST 16 04/12/2017 1405   AST 18 01/04/2017 1432   ALT 11 04/12/2017 1405   ALT 12 01/04/2017 1432   BILITOT 0.4 04/12/2017 1405   BILITOT 0.23 01/04/2017 1432       ASSESSMENT & PLAN:  Malignant neoplasm of supraglottis ([facility]) Clinically, he is doing well He is gaining weight He denies recent hemoptysis Last CT imaging showed excellent response to treatment Unfortunately, his insurance had declined PET  scan I will wait until his ENT doctor felt it is appropriate to for the tracheostomy removal After that, I plan to repeat CT scan of the neck to make sure he had no residual disease before removing the port I will see him back in 3 months for further assessment  Status post tracheostomy [facility]) The tracheostomy site looks good without signs of bleeding or infection The plan is to keep the tracheostomy for now per ENT  Preventive measure We discussed the importance of preventive care and reviewed the vaccination programs. He does not have any prior allergic reactions to influenza vaccination. He agrees to proceed with influenza vaccination today and we will administer it today at the clinic. I will check TSH in his next visit to screen for hypothyroidism after radiation exposure.   Orders Placed This Encounter  Procedures  . CBC with Differential/Platelet    Standing Status:   Future    Standing Expiration Date:   05/17/2018  . Comprehensive metabolic panel    Standing Status:   Future    Standing Expiration Date:   05/17/2018  . TSH    Standing Status:   Future    Standing Expiration Date:   05/17/2018   All questions were answered. The patient knows to call the clinic with any problems, questions or concerns. No  barriers to learning was detected. I spent 15 minutes counseling the patient face to face. The total time spent in the appointment was 20 minutes and more than 50% was on counseling and review of test results     Heath Lark, MD 04/13/2017 11:10 AM

## 2017-04-13 NOTE — Assessment & Plan Note (Signed)
We discussed the importance of preventive care and reviewed the vaccination programs. He does not have any prior allergic reactions to influenza vaccination. He agrees to proceed with influenza vaccination today and we will administer it today at the clinic. I will check TSH in his next visit to screen for hypothyroidism after radiation exposure.

## 2017-04-13 NOTE — Assessment & Plan Note (Signed)
The tracheostomy site looks good without signs of bleeding or infection The plan is to keep the tracheostomy for now per ENT

## 2017-05-07 ENCOUNTER — Ambulatory Visit: Payer: Medicaid Other | Admitting: Internal Medicine

## 2017-05-07 ENCOUNTER — Encounter: Payer: Self-pay | Admitting: Internal Medicine

## 2017-05-07 ENCOUNTER — Ambulatory Visit: Payer: Self-pay | Admitting: Internal Medicine

## 2017-05-07 VITALS — BP 131/88 | HR 88 | Wt 128.6 lb

## 2017-05-07 DIAGNOSIS — C321 Malignant neoplasm of supraglottis: Secondary | ICD-10-CM

## 2017-05-07 DIAGNOSIS — Z9221 Personal history of antineoplastic chemotherapy: Secondary | ICD-10-CM | POA: Diagnosis not present

## 2017-05-07 DIAGNOSIS — Z923 Personal history of irradiation: Secondary | ICD-10-CM

## 2017-05-07 DIAGNOSIS — Z299 Encounter for prophylactic measures, unspecified: Secondary | ICD-10-CM | POA: Diagnosis not present

## 2017-05-07 NOTE — Progress Notes (Signed)
   CC: Supraglottic cancer f/u   HPI:  [name] is a 57 y.o. male with past medical history outlined below here for follow up of his supraglottic cancer. For the details of today's visit, please refer to the assessment and plan.  Past Medical History:  Diagnosis Date  . Allergic rhinitis   . Arthritis    Bilateral knee arthritis for about 20 years. Not taking any medications, believes in nature processes  . Cancer (HCC)    larynx  . Colon polyp   . Dental caries    prechemoradiation therapy, periodontitis  . GERD (gastroesophageal reflux disease)   . Headache   . History of radiation therapy 09/11/16- 10/30/16   Larynx 70 Gy in 35 fractions  . HLD (hyperlipidemia)     Review of Systems  Constitutional: Negative for chills, fever and weight loss.    Physical Exam:  Vitals:   05/07/17 1338 05/07/17 1403  BP: (!) 137/96 131/88  Pulse: 98 88  SpO2: 99%   Weight: 128 lb 9.6 oz (58.3 kg)     Constitutional: NAD, appears comfortable HEENT: Atraumatic, normocephalic. PERRL, anicteric sclera. Moist mucous membranes.  Neck: Supple, trachea midline. No lymphadenopathy. Open tracheostomy site appears to be healing well, no drainage or erythema. Cardiovascular: RRR, no murmurs, rubs, or gallops.  Pulmonary/Chest: CTAB, no wheezes, rales, or rhonchi.  Abdominal: Soft, non tender, non distended. +BS.  Extremities: Warm and well perfused. No edema.  Psychiatric: Normal mood and affect  Assessment & Plan:   See Encounters Tab for problem based charting.  Patient discussed with Dr. Angelia [name]

## 2017-05-07 NOTE — Patient Instructions (Signed)
[name],  It was a pleasure to see you today. I am glad you are doing well! I will call you with the results of your thyroid test today. Please return to see me again in 6 months, or sooner if you have any issues. If you have any questions or concerns, call our clinic at [phone] or after hours call [phone] and ask for the internal medicine resident on call. Thank you!  - Dr. Philipp Ovens

## 2017-05-08 ENCOUNTER — Encounter: Payer: Self-pay | Admitting: Internal Medicine

## 2017-05-08 LAB — TSH: TSH: 3.32 u[IU]/mL (ref 0.450–4.500)

## 2017-05-08 NOTE — Progress Notes (Signed)
Internal Medicine Clinic Attending  Case discussed with Dr. Guilloud at the time of the visit.  We reviewed the resident's history and exam and pertinent patient test results.  I agree with the assessment, diagnosis, and plan of care documented in the resident's note.  

## 2017-05-08 NOTE — Assessment & Plan Note (Signed)
Patient is here for routine follow up. 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 Patient has now completed chemotherapy (Cisplatin, Fluorouracil, and Docetaxel) and radiation therapy. He is off all medication. He had his trach was removed last week, ostomy side is covered with steri strips but appears to be healing well without signs of infection. He is gaining weight and overall appears well. He has no complaints today. Remains very grateful for his care.  -- Follow up 6 months to 1 year as needed

## 2017-05-08 NOTE — Assessment & Plan Note (Addendum)
Patient is up to date with vaccination. He is scheduled for a colonoscopy next week, otherwise up to date with screening. He was suppose to have a TSH checked last month per his oncologist to screen for hypothyroidism after radiation exposure but did not have this done. Will check today.  -- TSH  ADDENDUM: TSH normal. Left VM with results. Repeat 6-12 months.

## 2017-05-11 ENCOUNTER — Telehealth: Payer: Self-pay | Admitting: Hematology and Oncology

## 2017-05-11 NOTE — Telephone Encounter (Signed)
Tried to call patient.

## 2017-05-14 ENCOUNTER — Encounter: Payer: Self-pay | Admitting: Internal Medicine

## 2017-05-14 ENCOUNTER — Ambulatory Visit: Payer: Medicaid Other | Admitting: Internal Medicine

## 2017-05-14 VITALS — BP 132/98 | HR 100 | Ht 65.0 in | Wt 127.2 lb

## 2017-05-14 DIAGNOSIS — Z8521 Personal history of malignant neoplasm of larynx: Secondary | ICD-10-CM | POA: Diagnosis not present

## 2017-05-14 DIAGNOSIS — Z86010 Personal history of colonic polyps: Secondary | ICD-10-CM

## 2017-05-14 MED ORDER — SUPREP BOWEL PREP KIT 17.5-3.13-1.6 GM/177ML PO SOLN: 1.0000 | ORAL | 0 refills | Status: DC

## 2017-05-14 NOTE — Patient Instructions (Signed)
You have been scheduled for a colonoscopy. Please follow written instructions given to you at your visit today.  Please pick up your prep supplies at the pharmacy within the next 1-3 days. If you use inhalers (even only as needed), please bring them with you on the day of your procedure. Your physician has requested that you go to www.startemmi.com and enter the access code given to you at your visit today. This web site gives a general overview about your procedure. However, you should still follow specific instructions given to you by our office regarding your preparation for the procedure.  If you are age 71 or older, your body mass index should be between 23-30. Your Body mass index is 21.18 kg/m. If this is out of the aforementioned range listed, please consider follow up with your Primary Care Provider.  If you are age 27 or younger, your body mass index should be between 19-25. Your Body mass index is 21.18 kg/m. If this is out of the aformentioned range listed, please consider follow up with your Primary Care Provider.

## 2017-05-14 NOTE — Progress Notes (Signed)
Patient ID: Jonathan [name], male   DOB: 1960/04/25, 57 y.o.   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  He is here alone today.  He reports he is feeling well and his tracheostomy was removed last week by Dr. Constance Holster, his otolaryngologist.  He completed radiation therapy on 10/30/2016 and completed chemotherapy on 08/22/2016.  He is felt to be in remission and doing well.  He denies shortness of breath, head neck pain, chest pain, dysphagia and odynophagia.  He denies heartburn.  He is not taking any medicines.  He had a screening colonoscopy which I performed on 03/18/2012.  This revealed a 3 mm ascending colon polyp, a 7 mm transverse colon polyp and a 20 mm pedunculated sigmoid polyp all of which were removed.  He had mild diverticulosis in the sigmoid and moderately sized internal hemorrhoids.  1 of these polyps was an adenoma.  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  He reports normal bowel habits without diarrhea or constipation.  Reports good appetite.  Denies blood in his stool and melena.  Past Medical History:  Diagnosis Date  . Allergic rhinitis   . Arthritis    Bilateral knee arthritis for about 20 years. Not taking any medications, believes in nature processes  . Cancer (HCC)    larynx  . Colon polyp   . Dental caries    prechemoradiation therapy, periodontitis  . GERD (gastroesophageal reflux disease)   . Headache   . History of radiation therapy 09/11/16- 10/30/16   Larynx 70 Gy in 35 fractions  . HLD (hyperlipidemia)     Past Surgical History:  Procedure Laterality Date  . COLONOSCOPY  03/18/2012  . ESOPHAGOSCOPY  04/24/2016   Procedure: ESOPHAGOSCOPY;  Surgeon: Izora Gala, MD;  Location: [facility];  Service: ENT;;  . Everlean Alstrom GENERIC HISTORICAL  06/02/2016    IR US GUIDE VASC ACCESS RIGHT 06/02/2016 Sandi Mariscal, MD [facility]  . IR GENERIC HISTORICAL  06/02/2016   IR FLUORO GUIDE PORT INSERTION RIGHT 06/02/2016 Sandi Mariscal, MD [facility]  . LARYNGOSCOPY  04/24/2016   Procedure: LARYNGOSCOPY;  Surgeon: Izora Gala, MD;  Location: [facility];  Service: ENT;;  . MULTIPLE EXTRACTIONS WITH ALVEOLOPLASTY N/A 05/26/2016   Procedure: Extraction of tooth #'s 2,5,6,8-13, 21-28 with alveoloplasty, bilateral mandibular tori reductions and buccal exostoses reductions of maxillary left, maxillary right, and mandibular left quadrants.;  Surgeon: Lenn Cal, DDS;  Location: [facility];  Service: Oral Surgery;  Laterality: N/A;  . TRACHEOSTOMY TUBE PLACEMENT N/A 04/24/2016   Procedure: TRACHEOSTOMY;  Surgeon: Izora Gala, MD;  Location: [facility];  Service: ENT;  Laterality: N/A;    No outpatient medications prior to visit.   No facility-administered medications prior to visit.     Allergies  Allergen Reactions  . No Known Allergies     Family History  Problem Relation Age of Onset  . Prostate cancer Maternal Uncle 66       On radiation therapy now, diagnosed in around June 2012  . Colon cancer Maternal Uncle   . Heart attack Paternal Grandfather        Diet due to MI  . Lung cancer Paternal Aunt   . Brain cancer Paternal Aunt   . Kidney disease Maternal Uncle   . Hypertension Mother   . CAD Father   .  Colon polyps Neg Hx   . Diabetes Neg Hx   . Gallbladder disease Neg Hx   . Esophageal cancer Neg Hx     Social History   Tobacco Use  . Smoking status: Former Smoker    Packs/day: 0.50    Years: 30.00    Pack years: 15.00    Types: Cigarettes    Last attempt to quit: 12/31/2015    Years since quitting: 1.3  . Smokeless tobacco: Never Used  . Tobacco comment: not everyday-working on stopping completely   Substance Use Topics  . Alcohol use: No    Alcohol/week: 1.2 oz    Types: 2 Cans of beer per week  . Drug use: No    Comment: denies     ROS: As per history of present illness, otherwise negative  BP (!) 132/98   Pulse 100   Ht 5\' 5"  (1.651 m)   Wt 127 lb 4 oz (57.7 kg)   BMI 21.18 kg/m  Constitutional: Well-developed and well-nourished. No distress. HEENT: Normocephalic and atraumatic. Oropharynx is clear and moist. Conjunctivae are normal.  No scleral icterus. Neck: Tracheostomy site covered with Steri-Strips without surrounding erythema or tenderness Cardiovascular: Normal rate, regular rhythm and intact distal pulses. No M/R/G Pulmonary/chest: Effort normal and breath sounds normal. No wheezing, rales or rhonchi. Abdominal: Soft, nontender, nondistended. Bowel sounds active throughout. There are no masses palpable. No hepatosplenomegaly.  Extremities: no clubbing, cyanosis, or edema Neurological: Alert and oriented to person place and time. Skin: Skin is warm and dry.  Psychiatric: Normal mood and affect. Behavior is normal.  RELEVANT LABS AND IMAGING: CBC    Component Value Date/Time   WBC 4.2 04/12/2017 1405   RBC 4.35 04/12/2017 1405   HGB 13.5 04/12/2017 1405   HGB 11.9 (L) 01/04/2017 1432   HCT 40.8 04/12/2017 1405   HCT 35.7 (L) 01/04/2017 1432   PLT 269 04/12/2017 1405   PLT 265 01/04/2017 1432   MCV 93.6 04/12/2017 1405   MCV 90.5 01/04/2017 1432   MCH 30.9 04/12/2017 1405   MCHC 33.0 04/12/2017 1405   RDW 15.5 04/12/2017 1405   RDW 16.1 (H) 01/04/2017 1432   LYMPHSABS 0.6 (L) 04/12/2017 1405   LYMPHSABS 0.5 (L) 01/04/2017 1432   MONOABS 0.5 04/12/2017 1405   MONOABS 0.5 01/04/2017 1432   EOSABS 0.2 04/12/2017 1405   EOSABS 0.4 01/04/2017 1432   BASOSABS 0.0 04/12/2017 1405   BASOSABS 0.0 01/04/2017 1432    CMP     Component Value Date/Time   NA 140 04/12/2017 1405   NA 141 01/04/2017 1432   K 3.9 04/12/2017 1405   K 4.1 01/04/2017 1432   CL 106 04/12/2017 1405   CO2 26 04/12/2017 1405   CO2 27 01/04/2017 1432   GLUCOSE 89 04/12/2017 1405   GLUCOSE 93 01/04/2017 1432   BUN  12 04/12/2017 1405   BUN 12.2 01/04/2017 1432   CREATININE 0.95 04/12/2017 1405   CREATININE 0.8 01/04/2017 1432   CALCIUM 9.5 04/12/2017 1405   CALCIUM 9.8 01/04/2017 1432   PROT 7.7 04/12/2017 1405   PROT 6.9 01/04/2017 1432   ALBUMIN 4.2 04/12/2017 1405   ALBUMIN 3.8 01/04/2017 1432   AST 16 04/12/2017 1405   AST 18 01/04/2017 1432   ALT 11 04/12/2017 1405   ALT 12 01/04/2017 1432   ALKPHOS 81 04/12/2017 1405   ALKPHOS 67 01/04/2017 1432   BILITOT 0.4 04/12/2017 1405   BILITOT 0.23 01/04/2017 1432   GFRNONAA >60 04/12/2017  [place] 03/06/2011 0853   GFRAA >60 04/12/2017 1405   GFRAA >89 03/06/2011 0853    ASSESSMENT/PLAN: 57 year old male with a history of adenomatous colon polyps but more recently 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  1.  History of adenomatous colon polyp -he is due surveillance colonoscopy at this time.  I recommended we proceed with surveillance colonoscopy in a few months after he has had time for full healing of his tracheostomy site.  We discussed the risk, benefits and alternatives and he is agreeable and wishes to proceed.  2.  Laryngeal cancer --felt to be in remission after chemotherapy and radiation.  Following closely with Dr. Constance Holster with ENT and Dr. Alvy Bimler with oncology.      SE:LTRVUYEB, [place], [place] [address] [place], [place] [address]

## 2017-05-16 ENCOUNTER — Telehealth: Payer: Self-pay | Admitting: Hematology and Oncology

## 2017-05-16 NOTE — Telephone Encounter (Signed)
Patient said Dr wanted to see him before April

## 2017-05-21 ENCOUNTER — Encounter: Payer: Self-pay | Admitting: Hematology and Oncology

## 2017-05-21 ENCOUNTER — Inpatient Hospital Stay: Payer: Medicaid Other | Attending: Hematology and Oncology | Admitting: Hematology and Oncology

## 2017-05-21 VITALS — BP 154/113 | HR 101 | Temp 99.0°F | Resp 18 | Ht 65.0 in | Wt 124.9 lb

## 2017-05-21 DIAGNOSIS — R030 Elevated blood-pressure reading, without diagnosis of hypertension: Secondary | ICD-10-CM | POA: Insufficient documentation

## 2017-05-21 DIAGNOSIS — R221 Localized swelling, mass and lump, neck: Secondary | ICD-10-CM

## 2017-05-21 DIAGNOSIS — C321 Malignant neoplasm of supraglottis: Secondary | ICD-10-CM | POA: Diagnosis present

## 2017-05-21 DIAGNOSIS — Z452 Encounter for adjustment and management of vascular access device: Secondary | ICD-10-CM | POA: Diagnosis not present

## 2017-05-21 DIAGNOSIS — I10 Essential (primary) hypertension: Secondary | ICD-10-CM | POA: Insufficient documentation

## 2017-05-21 DIAGNOSIS — J3489 Other specified disorders of nose and nasal sinuses: Secondary | ICD-10-CM | POA: Insufficient documentation

## 2017-05-21 NOTE — Assessment & Plan Note (Signed)
He has mild nasal drainage Examination is benign He does not need antibiotic treatment.  Observe only

## 2017-05-21 NOTE — Progress Notes (Signed)
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 If CT scan is negative, I will get his port removed.  Elevated BP without diagnosis of hypertension His blood pressure is elevated but could be due to anxiety I will monitor closely   Nasal drainage He has mild nasal drainage Examination is benign He does not need antibiotic treatment.  Observe only   Orders Placed This Encounter  Procedures  . CT SOFT TISSUE NECK W CONTRAST    Standing Status:   Future    Standing Expiration Date:   08/19/2018    Order Specific Question:   If indicated for the ordered procedure, I authorize the administration of contrast media per Radiology protocol    Answer:   Yes    Order Specific Question:   Preferred imaging location?    Answer:   [facility]    Order Specific Question:   Radiology Contrast Protocol - do NOT remove file path    Answer:   [email or web address]    INTERVAL HISTORY: Please see below for problem oriented charting. He returns with his fiance for further management He had his trach removed 2-1/2 weeks ago The tracheostomy site is healing well He complained of some sinus drainage but no fever or chills He denies recent smoking or drinking He denies dysphagia. He denies hemoptysis  SUMMARY OF ONCOLOGIC HISTORY:   Malignant neoplasm of supraglottis ([place])   04/17/2016 Imaging    Ct neck showed relatively large approximately 3.5 cm laryngeal mass most compatible with laryngeal carcinoma. Epicenter at the glottis on the right. Extension across midline as well as involvement of laryngeal  cartilages (possibly also bilateral). 2. Relatively small but malignant appearing right level 2 lymphadenopathy. Suspicious contralateral left level 2 lymph nodes.      04/21/2016 Initial Diagnosis    He saw Dr. Constance Holster in the clinic for chronic hoarseness. In the office, it was noted that the posterior soft palate, uvula, tongue base and vallecula were visualized and appeared healthy without mucosal masses or lesions. There is a large mass involving the right side of the larynx, the right cord is fixed. There may be involvement of the post cricoid area it is difficult to say for sure. 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 Clinically this is at least staged as T3. The airway is significantly impaired. He is at risk for impending airway obstruction. ENT recommended him for awake tracheostomy with direct laryngoscopy and biopsy and esophagoscopy under anesthesia immediately following that.       04/24/2016 Pathology Results    Larynx, biopsy, Right Side - POSITIVE FOR INVASIVE SQUAMOUS CELL CARCINOMA. - SEE COMMENT. Microscopic Comment As sampled, the squamous cell carcinoma appears well differentiated. A fragment of benign cartilage is also present in the specimen.      04/24/2016 Surgery    She underwent direct laryngoscopy with biopsy. An anterior commissure scope was used to evaluate the larynx. A large mass and, using the right side of the larynx and the anterior commissure area was identified. Multiple biopsies were taken. The left cord was swollen but seemed to be  uninvolved. Piriforms appeared to be clear. The post cricoid area appeared clear. Esophagoscopy was difficult and ENT surgeon was unable to pass the scope into the esophagus. He had tracheostomy      04/24/2016 Pathology Results    Larynx, biopsy, Right Side - POSITIVE FOR INVASIVE SQUAMOUS CELL CARCINOMA. - SEE COMMENT. Microscopic Comment As sampled, the squamous cell carcinoma  appears well differentiated. A fragment of benign cartilage is also present in the specimen.       05/15/2016 PET scan    Right eccentric glottic mass, appears to cross the anterior commissure with destructive findings of the thyroid cartilage. There is an upper normal size right station 2A lymph node with maximum SUV of 4.2 which is only very faintly elevated over background, but which could possibly represent a small amount of local metastatic spread. 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 2. Asymmetric calcification along the head of the right caudate nucleus and adjacent lentiform nucleus. Well this can sometimes be physiologic, there is some accompanying reduction in PET activity, and the asymmetry is an unusual feature. Accordingly I recommend MRI of the brain with and without contrast for further characterization. 3. A AP window lymph node is minimally enlarged at 1.1 cm, but with a maximum SUV below background mediastinal levels, likely benign.      05/23/2016 Imaging    MRI brain: Atrophy with chronic microvascular ischemic change. No intracranial metastatic disease. 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       05/26/2016 Surgery    Multiple extraction of tooth numbers 2, 5, 6, 8-13, and  21-28. 2. 4 Quadrants of alveoloplasty. 3. Bilateral mandibular lingual tori reductions 4. Maxillary right, maxillary left, and mandibular left buccal exostoses reductions      06/02/2016 Procedure    Successful placement of a right internal jugular approach power injectable Port-A-Cath. The catheter is ready for immediate use      06/09/2016 - 08/22/2016 Chemotherapy    He received induction chemotherapy with taxotere, cisplatin and 5 FU x 4 cycles      07/31/2016 PET scan     Mild asymmetric soft tissue prominence along the right supraglottic larynx, without hypermetabolism, likely corresponding to treated tumor. No findings suspicious for metastatic disease. Small mediastinal and left axillary nodes, without associated hypermetabolism.      09/11/2016 - 10/30/2016 Radiation Therapy    He received radiation Radiation treatment dates:   09/11/16-10/30/16  Site/dose:   Larynx and b/l neck, 70 Gy in 35 fractions  Beams/energy:   IMRT, 6X       01/01/2017 Imaging    CT neck: Asymmetric soft tissue prominence in the right supraglottic region extending down to the glottis on the right. This appears similar to the previous PET scan at which time there was not any hypermetabolism in the region. Therefore, this could be chronic post treatment change. However, there does appear to be some enhancing tissue and I cannot exclude the possibility of residual or recurrent tumor on the basis of this exam. There is no evidence of any nodal disease.      05/04/2017 Procedure    Trachestomy was removed       REVIEW OF SYSTEMS:   Constitutional: Denies fevers, chills or abnormal weight loss Eyes: Denies blurriness of vision Ears, nose, mouth, throat, and face: Denies  mucositis or sore throat Respiratory: Denies cough, dyspnea or wheezes Cardiovascular: Denies palpitation, chest discomfort or lower extremity swelling Gastrointestinal:  Denies nausea, heartburn or change in bowel habits Skin: Denies abnormal skin rashes Lymphatics: Denies new lymphadenopathy or easy bruising Neurological:Denies numbness, tingling or new weaknesses Behavioral/Psych: Mood is stable, no new changes  All other systems were reviewed with the patient and are negative.  I have reviewed the past medical history, past surgical history, social history and family history with the patient and they are unchanged from previous note.  ALLERGIES:  is allergic to no known allergies.  MEDICATIONS:  Current  Outpatient Medications  Medication Sig Dispense Refill  . SUPREP BOWEL PREP KIT 17.5-3.13-1.6 GM/177ML SOLN Take 1 kit by mouth as directed. 354 mL 0   No current facility-administered medications for this visit.     PHYSICAL EXAMINATION: ECOG PERFORMANCE STATUS: 1 - Symptomatic but completely ambulatory  Vitals:   05/21/17 1342  BP: (!) 154/113  Pulse: (!) 101  Resp: 18  Temp: 99 F (37.2 C)  SpO2: 99%   Filed Weights   05/21/17 1342  Weight: 124 lb 14.4 oz (56.7 kg)    GENERAL:alert, no distress and comfortable SKIN: skin color, texture, turgor are normal, no rashes or significant lesions EYES: normal, Conjunctiva are pink and non-injected, sclera clear OROPHARYNX:no exudate, no erythema and lips, buccal mucosa, and tongue normal  NECK: tracheostomy has been removed. The area looks clean LYMPH:  no palpable lymphadenopathy in the cervical, axillary or inguinal LUNGS: clear to auscultation and percussion with normal breathing effort HEART: regular rate & rhythm and no murmurs and no lower extremity edema ABDOMEN:abdomen soft, non-tender and normal bowel sounds Musculoskeletal:no cyanosis of digits and no clubbing  NEURO: alert & oriented x 3 with fluent speech, no focal motor/sensory deficits  LABORATORY DATA:  I have reviewed the data as listed    Component Value Date/Time   NA 140 04/12/2017 1405   NA 141 01/04/2017 1432   K 3.9 04/12/2017 1405   K 4.1 01/04/2017 1432   CL 106 04/12/2017 1405   CO2 26 04/12/2017 1405   CO2 27 01/04/2017 1432   GLUCOSE 89 04/12/2017 1405   GLUCOSE 93 01/04/2017 1432   BUN 12 04/12/2017 1405   BUN 12.2 01/04/2017 1432   CREATININE 0.95 04/12/2017 1405   CREATININE 0.8 01/04/2017 1432   CALCIUM 9.5 04/12/2017 1405   CALCIUM 9.8 01/04/2017 1432   PROT 7.7 04/12/2017 1405   PROT 6.9 01/04/2017 1432   ALBUMIN 4.2 04/12/2017 1405   ALBUMIN 3.8 01/04/2017 1432   AST 16 04/12/2017 1405   AST 18 01/04/2017 1432   ALT 11  04/12/2017 1405   ALT 12 01/04/2017 1432   ALKPHOS 81 04/12/2017 1405   ALKPHOS 67 01/04/2017 1432   BILITOT 0.4 04/12/2017 1405   BILITOT 0.23 01/04/2017 1432   GFRNONAA >60 04/12/2017 1405   GFRNONAA 82 03/06/2011 0853   GFRAA >60 04/12/2017 1405   GFRAA >89 03/06/2011 0853    No results found for: SPEP, UPEP  Lab Results  Component Value Date   WBC 4.2 04/12/2017   NEUTROABS 2.9 04/12/2017   HGB 13.5 04/12/2017   HCT 40.8 04/12/2017   MCV 93.6 04/12/2017   PLT 269 04/12/2017      Chemistry      Component Value Date/Time   NA 140 04/12/2017 1405   NA 141 01/04/2017 1432   K 3.9 04/12/2017 1405   K 4.1 01/04/2017 1432  CL 106 04/12/2017 1405   CO2 26 04/12/2017 1405   CO2 27 01/04/2017 1432   BUN 12 04/12/2017 1405   BUN 12.2 01/04/2017 1432   CREATININE 0.95 04/12/2017 1405   CREATININE 0.8 01/04/2017 1432      Component Value Date/Time   CALCIUM 9.5 04/12/2017 1405   CALCIUM 9.8 01/04/2017 1432   ALKPHOS 81 04/12/2017 1405   ALKPHOS 67 01/04/2017 1432   AST 16 04/12/2017 1405   AST 18 01/04/2017 1432   ALT 11 04/12/2017 1405   ALT 12 01/04/2017 1432   BILITOT 0.4 04/12/2017 1405   BILITOT 0.23 01/04/2017 1432      All questions were answered. The patient knows to call the clinic with any problems, questions or concerns. No barriers to learning was detected.  I spent 15 minutes counseling the patient face to face. The total time spent in the appointment was 20 minutes and more than 50% was on counseling and review of test results  Heath Lark, MD 05/21/2017 2:43 PM

## 2017-05-21 NOTE — Assessment & Plan Note (Signed)
His blood pressure is elevated but could be due to anxiety I will monitor closely

## 2017-05-21 NOTE — Assessment & Plan Note (Signed)
He is doing well Examination is benign due to prior abnormal CT, I plan to repeat CT scan of the neck with contrast to exclude disease. If CT scan is negative, I will get his port removed.

## 2017-05-24 ENCOUNTER — Inpatient Hospital Stay: Payer: Medicaid Other

## 2017-05-24 ENCOUNTER — Ambulatory Visit (HOSPITAL_COMMUNITY): Payer: Medicaid Other

## 2017-05-24 DIAGNOSIS — C321 Malignant neoplasm of supraglottis: Secondary | ICD-10-CM | POA: Diagnosis not present

## 2017-05-24 MED ORDER — HEPARIN SOD (PORK) LOCK FLUSH 100 UNIT/ML IV SOLN
500.0000 [IU] | Freq: Once | INTRAVENOUS | Status: AC
  Administered 2017-05-24: 500 [IU]
  Filled 2017-05-24: qty 5

## 2017-05-24 MED ORDER — SODIUM CHLORIDE 0.9% FLUSH
10.0000 mL | Freq: Once | INTRAVENOUS | Status: AC
  Administered 2017-05-24: 10 mL
  Filled 2017-05-24: qty 10

## 2017-06-11 ENCOUNTER — Ambulatory Visit (HOSPITAL_COMMUNITY)
Admission: RE | Admit: 2017-06-11 | Discharge: 2017-06-11 | Disposition: A | Discharge status: Home / Self Care | Payer: Medicaid Other | Source: Ambulatory Visit | Attending: Hematology and Oncology | Admitting: Hematology and Oncology

## 2017-06-11 DIAGNOSIS — R9389 Abnormal findings on diagnostic imaging of other specified body structures: Secondary | ICD-10-CM | POA: Insufficient documentation

## 2017-06-11 DIAGNOSIS — R221 Localized swelling, mass and lump, neck: Secondary | ICD-10-CM | POA: Diagnosis present

## 2017-06-11 DIAGNOSIS — J384 Edema of larynx: Secondary | ICD-10-CM | POA: Insufficient documentation

## 2017-06-11 DIAGNOSIS — J3800 Paralysis of vocal cords and larynx, unspecified: Secondary | ICD-10-CM | POA: Diagnosis not present

## 2017-06-11 DIAGNOSIS — Z9889 Other specified postprocedural states: Secondary | ICD-10-CM | POA: Insufficient documentation

## 2017-06-11 DIAGNOSIS — J387 Other diseases of larynx: Secondary | ICD-10-CM | POA: Diagnosis not present

## 2017-06-11 DIAGNOSIS — K1120 Sialoadenitis, unspecified: Secondary | ICD-10-CM | POA: Diagnosis not present

## 2017-06-11 MED ORDER — SODIUM CHLORIDE 0.9 % IJ SOLN
INTRAMUSCULAR | Status: AC
  Filled 2017-06-11: qty 50

## 2017-06-11 MED ORDER — IOPAMIDOL (ISOVUE-300) INJECTION 61%
INTRAVENOUS | Status: AC
  Administered 2017-06-11: 75 mL via INTRAVENOUS
  Filled 2017-06-11: qty 75

## 2017-06-12 ENCOUNTER — Other Ambulatory Visit: Payer: Self-pay | Admitting: Hematology and Oncology

## 2017-06-12 ENCOUNTER — Telehealth: Payer: Self-pay | Admitting: Hematology and Oncology

## 2017-06-12 DIAGNOSIS — C321 Malignant neoplasm of supraglottis: Secondary | ICD-10-CM

## 2017-06-12 NOTE — Telephone Encounter (Signed)
I spoke with the patient over the telephone. I reviewed with him CT scan of the neck which appears somewhat abnormal. I have also reach out to ENT physician to schedule return visit for direct laryngoscopy. I will also get his case presented next week at the tumor board. In the meantime, I will arrange PET CT scan and MRI and see him back next week to review test results. He agreed with the plan of care.

## 2017-06-19 ENCOUNTER — Telehealth: Payer: Self-pay | Admitting: *Deleted

## 2017-06-19 NOTE — Telephone Encounter (Signed)
Oncology Nurse Navigator Documentation  Spoke with Dr. Janeice Robinson MA, Donnald Garre, arranged for pt to be seen by Dr. Constance Holster this Friday 3/22 2:50.  LVMM on pt's mobile and home phone, wife's mobile, asking for call back re appt.  [name], RN, BSN Head & Neck Oncology Nurse [place] at [place] [phone]

## 2017-06-19 NOTE — Telephone Encounter (Signed)
Oncology Nurse Navigator Documentation  Rec'd call-back from Mr. Borg, informed him of 3/22 2:50 appt with Dr. Constance Holster.  He voiced understanding.  [name], RN, BSN Head & Neck Oncology Nurse [place] at [place] [phone]

## 2017-06-21 ENCOUNTER — Ambulatory Visit (HOSPITAL_COMMUNITY)
Admission: RE | Admit: 2017-06-21 | Discharge: 2017-06-21 | Disposition: A | Discharge status: Home / Self Care | Payer: Medicaid Other | Source: Ambulatory Visit | Attending: Hematology and Oncology | Admitting: Hematology and Oncology

## 2017-06-21 DIAGNOSIS — G9389 Other specified disorders of brain: Secondary | ICD-10-CM | POA: Diagnosis not present

## 2017-06-21 DIAGNOSIS — C321 Malignant neoplasm of supraglottis: Secondary | ICD-10-CM | POA: Insufficient documentation

## 2017-06-21 DIAGNOSIS — Q283 Other malformations of cerebral vessels: Secondary | ICD-10-CM | POA: Insufficient documentation

## 2017-06-21 DIAGNOSIS — I700 Atherosclerosis of aorta: Secondary | ICD-10-CM | POA: Diagnosis not present

## 2017-06-21 DIAGNOSIS — I2510 Atherosclerotic heart disease of native coronary artery without angina pectoris: Secondary | ICD-10-CM | POA: Diagnosis not present

## 2017-06-21 LAB — GLUCOSE, CAPILLARY: Glucose-Capillary: 103 mg/dL — ABNORMAL HIGH (ref 65–99)

## 2017-06-21 MED ORDER — GADOBENATE DIMEGLUMINE 529 MG/ML IV SOLN
15.0000 mL | Freq: Once | INTRAVENOUS | Status: AC | PRN
  Administered 2017-06-21: 11 mL via INTRAVENOUS

## 2017-06-21 MED ORDER — FLUDEOXYGLUCOSE F - 18 (FDG) INJECTION
6.4300 | Freq: Once | INTRAVENOUS | Status: AC | PRN
  Administered 2017-06-21: 6.43 via INTRAVENOUS

## 2017-06-22 DIAGNOSIS — Z923 Personal history of irradiation: Secondary | ICD-10-CM | POA: Insufficient documentation

## 2017-06-27 ENCOUNTER — Telehealth: Payer: Self-pay

## 2017-06-27 NOTE — Telephone Encounter (Signed)
-----   Message from Heath Lark, MD sent at 06/27/2017  7:49 AM EDT ----- Regarding: ENT Dr. Constance Holster Can you help me call Dr. Janeice Robinson nurse to find out when is he planning to get biopsy?

## 2017-06-27 NOTE — Telephone Encounter (Signed)
Called and left below message. Ask them to call nurse back.

## 2017-06-28 NOTE — Telephone Encounter (Signed)
Jonathan [name] with Dr. Janeice Robinson office called. Biopsy is scheduled for 4/5.

## 2017-06-28 NOTE — H&P (Signed)
Jonathan [name] is an 57 y.o. male.   Chief Complaint: Follow-up laryngeal cancer HPI: History of radiation for laryngeal cancer.  Abnormal imaging results, suspicious for possible recurrence.  Past Medical History:  Diagnosis Date  . Allergic rhinitis   . Arthritis    Bilateral knee arthritis for about 20 years. Not taking any medications, believes in nature processes  . Cancer (HCC)    larynx  . Colon polyp   . Dental caries    prechemoradiation therapy, periodontitis  . GERD (gastroesophageal reflux disease)   . Headache   . History of radiation therapy 09/11/16- 10/30/16   Larynx 70 Gy in 35 fractions  . HLD (hyperlipidemia)     Past Surgical History:  Procedure Laterality Date  . COLONOSCOPY  03/18/2012  . ESOPHAGOSCOPY  04/24/2016   Procedure: ESOPHAGOSCOPY;  Surgeon: Izora Gala, MD;  Location: [place];  Service: ENT;;  . Everlean Alstrom GENERIC HISTORICAL  06/02/2016   IR US GUIDE VASC ACCESS RIGHT 06/02/2016 Sandi Mariscal, MD [facility]  . IR GENERIC HISTORICAL  06/02/2016   IR FLUORO GUIDE PORT INSERTION RIGHT 06/02/2016 Sandi Mariscal, MD [facility]  . LARYNGOSCOPY  04/24/2016   Procedure: LARYNGOSCOPY;  Surgeon: Izora Gala, MD;  Location: [place];  Service: ENT;;  . MULTIPLE EXTRACTIONS WITH ALVEOLOPLASTY N/A 05/26/2016   Procedure: Extraction of tooth #'s 2,5,6,8-13, 21-28 with alveoloplasty, bilateral mandibular tori reductions and buccal exostoses reductions of maxillary left, maxillary right, and mandibular left quadrants.;  Surgeon: Lenn Cal, DDS;  Location: [place];  Service: Oral Surgery;  Laterality: N/A;  . TRACHEOSTOMY TUBE PLACEMENT N/A 04/24/2016   Procedure: TRACHEOSTOMY;  Surgeon: Izora Gala, MD;  Location: [facility] OR;  Service: ENT;  Laterality: N/A;    Family History  Problem Relation Age of Onset  . Prostate cancer Maternal Uncle 66       On radiation therapy now, diagnosed in around June 2012  . Colon cancer Maternal Uncle   . Heart attack Paternal Grandfather        Diet  due to MI  . Lung cancer Paternal Aunt   . Brain cancer Paternal Aunt   . Kidney disease Maternal Uncle   . Hypertension Mother   . CAD Father   . Colon polyps Neg Hx   . Diabetes Neg Hx   . Gallbladder disease Neg Hx   . Esophageal cancer Neg Hx    Social History:  reports that he quit smoking about 17 months ago. His smoking use included cigarettes. He has a 15.00 pack-year smoking history. He has never used smokeless tobacco. He reports that he does not drink alcohol or use drugs.  Allergies:  Allergies  Allergen Reactions  . No Known Allergies     No medications prior to admission.    No results found for this or any previous visit (from the past 48 hour(s)). No results found.  ROS: otherwise negative  There were no vitals taken for this visit.  PHYSICAL EXAM: Overall appearance:  Healthy appearing, in no distress Head:  Normocephalic, atraumatic. Ears: External auditory canals are clear; tympanic membranes are intact and the middle ears are free of any effusion. Nose: External nose is healthy in appearance. Internal nasal exam free of any lesions or obstruction. Oral Cavity/pharynx:  There are no mucosal lesions or masses identified. Hypopharynx/Larynx: Right supraglottic fullness, diffuse edema of the supraglottic larynx. Neuro:  No identifiable neurologic deficits. Neck: No palpable neck masses.  Studies Reviewed: CT scan and PET scan  Assessment/Plan Possible recurrent laryngeal cancer.  Recommend direct laryngoscopy with biopsy of any unusual findings.  We discussed the possibility of needing tracheostomy for airway safety reasons.  Izora Gala 06/28/2017, 11:38 AM

## 2017-07-04 ENCOUNTER — Encounter (HOSPITAL_COMMUNITY): Payer: Self-pay

## 2017-07-04 ENCOUNTER — Other Ambulatory Visit (HOSPITAL_COMMUNITY): Payer: Self-pay | Admitting: *Deleted

## 2017-07-04 ENCOUNTER — Other Ambulatory Visit: Payer: Self-pay

## 2017-07-04 ENCOUNTER — Encounter (HOSPITAL_COMMUNITY)
Admission: RE | Admit: 2017-07-04 | Discharge: 2017-07-04 | Disposition: A | Discharge status: Home / Self Care | Payer: Medicaid Other | Source: Ambulatory Visit | Attending: Otolaryngology | Admitting: Otolaryngology

## 2017-07-04 DIAGNOSIS — C329 Malignant neoplasm of larynx, unspecified: Secondary | ICD-10-CM | POA: Insufficient documentation

## 2017-07-04 DIAGNOSIS — Z01812 Encounter for preprocedural laboratory examination: Secondary | ICD-10-CM | POA: Insufficient documentation

## 2017-07-04 DIAGNOSIS — E785 Hyperlipidemia, unspecified: Secondary | ICD-10-CM | POA: Diagnosis not present

## 2017-07-04 DIAGNOSIS — K219 Gastro-esophageal reflux disease without esophagitis: Secondary | ICD-10-CM | POA: Insufficient documentation

## 2017-07-04 DIAGNOSIS — Z01818 Encounter for other preprocedural examination: Secondary | ICD-10-CM | POA: Diagnosis present

## 2017-07-04 DIAGNOSIS — R9431 Abnormal electrocardiogram [ECG] [EKG]: Secondary | ICD-10-CM | POA: Diagnosis not present

## 2017-07-04 DIAGNOSIS — Z87891 Personal history of nicotine dependence: Secondary | ICD-10-CM | POA: Diagnosis not present

## 2017-07-04 HISTORY — DX: Pneumonia, unspecified organism: J18.9

## 2017-07-04 LAB — CBC
HCT: 45.2 % (ref 39.0–52.0)
Hemoglobin: 14.8 g/dL (ref 13.0–17.0)
MCH: 30.4 pg (ref 26.0–34.0)
MCHC: 32.7 g/dL (ref 30.0–36.0)
MCV: 92.8 fL (ref 78.0–100.0)
PLATELETS: 248 10*3/uL (ref 150–400)
RBC: 4.87 MIL/uL (ref 4.22–5.81)
RDW: 14.7 % (ref 11.5–15.5)
WBC: 4.2 10*3/uL (ref 4.0–10.5)

## 2017-07-04 NOTE — Progress Notes (Signed)
Anesthesia Chart Review:  Pt is a 57 year old male scheduled for direct laryngoscopy with biopsy, possible tracheotomy on 07/06/2017 with Izora Gala, MD  - PCP is Velna Ochs, MD - Oncologist is Heath Lark, MD  PMH includes:  Laryngeal cancer, hyperlipidemia, GERD. Former smoker (quit 12/31/15).  BMI 20. S/p dental extraction 05/26/16. S/p tracheostomy 04/24/16  Preoperative labs reviewed.    Medications reviewed.  VS:  BP (!) 155/106   Pulse 99   Temp 37 C (Oral)   Resp (!) 189   Ht 5\' 5"  (1.651 m)   Wt 121 lb 9 oz (55.1 kg)   SpO2 97%   BMI 20.23 kg/m   - BP has been elevated lately without diagnosis of HTN. Per oncology note 05/21/17 by Heath Lark, she is monitoring BP and questions whether elevated BP could be due to anxiety.  BP trends over last 3 months:  05/21/17: 154/113 05/14/17: 132/98 05/07/17: 137/96 04/12/17: 138/99   EKG 07/04/17: NSR. Nonspecific T wave abnormality.  Called to see pt for c/o chest pain in pre-admission testing.  Pt responded "yes" to RN question about having had chest pain recently.  Pt reports a 1 week hx of intermittent chest pain. Occurs maybe once a day. Describes CP as "achy", 2/10 in severity, lasts < 1 minute. CP associated with drinking ensure, relieved entirely by burping.  No other associated symptoms. Pt reports dairy causes him to be gassy, and feels his increased intake of Ensure in the last week (taking in an effort to gain weight) is the cause of his symptoms. CP never occurs with exertion. On exam, HRRR, lungs CTA B, no peripheral edema, pt in no distress, no active CP at pre-admission testing. I think this atypical chest pain is unlikely to be cardiac in origin.    I reviewed trends in BP with Dr. Smith Robert.  I let pt know his BP has been consistently high over last 3 months and advised him to see PCP for further evaluation. Pt understand if BP significantly high day of surgery, his procedure may be cancelled.   If BP acceptable day of  surgery, I anticipate pt can proceed with surgery as scheduled.   Willeen Cass, FNP-BC [facility]/Anesthesiology Phone: [phone] 07/05/2017 9:37 AM

## 2017-07-04 NOTE — Progress Notes (Signed)
Pt denies cardiac history, but states he does have occasional chest pain. States that it usually happens at rest, doesn't last long (around 30 seconds), denies any nausea, sob or diaphoresis associated with the pain. Most recent was last PM after eating. Pt's blood pressure elevated on arrival to his PAT appt. Rechecked it after the appt and still elevated. Called and spoke with Durel Salts, NP and she will come and see pt. EKG done due to c/o chest pain.

## 2017-07-04 NOTE — Pre-Procedure Instructions (Signed)
MARTEZE VECCHIO  07/04/2017    Your procedure is scheduled on Friday, July 06, 2017 at 8:45 AM.   Report to [facility] Entrance "A" Admitting Office at 6:45 AM.   Call this number if you have problems the morning of surgery: [phone]   Questions prior to day of surgery, please call [phone] between 8 & 4 PM.   Remember:  Do not eat food or drink liquids after midnight Thursday, 07/05/17.    Do not wear jewelry.  Do not wear lotions, powders, cologne or deodorant.  Men may shave face and neck.  Do not bring valuables to the hospital.  [facility] is not responsible for any belongings or valuables.  Contacts, dentures or bridgework may not be worn into surgery.  Leave your suitcase in the car.  After surgery it may be brought to your room.  For patients admitted to the hospital, discharge time will be determined by your treatment team.  Patients discharged the day of surgery will not be allowed to drive home.   [facility] - Preparing for Surgery  Before surgery, you can play an important role.  Because skin is not sterile, your skin needs to be as free of germs as possible.  You can reduce the number of germs on you skin by washing with CHG (chlorahexidine gluconate) soap before surgery.  CHG is an antiseptic cleaner which kills germs and bonds with the skin to continue killing germs even after washing.  Please DO NOT use if you have an allergy to CHG or antibacterial soaps.  If your skin becomes reddened/irritated stop using the CHG and inform your nurse when you arrive at Short Stay.  Do not shave (including legs and underarms) for at least 48 hours prior to the first CHG shower.  You may shave your face.  Please follow these instructions carefully:   1.  Shower with CHG Soap the night before surgery and the                    morning of Surgery.  2.  If you choose to wash your hair, wash your hair first as usual with your       normal shampoo.  3.  After you  shampoo, rinse your hair and body thoroughly to remove the shampoo.  4.  Use CHG as you would any other liquid soap.  You can apply chg directly       to the skin and wash gently with scrungie or a clean washcloth.  5.  Apply the CHG Soap to your body ONLY FROM THE NECK DOWN.        Do not use on open wounds or open sores.  Avoid contact with your eyes, ears, mouth and genitals (private parts).  Wash genitals (private parts) with your normal soap.  6.  Wash thoroughly, paying special attention to the area where your surgery        will be performed.  7.  Thoroughly rinse your body with warm water from the neck down.  8.  DO NOT shower/wash with your normal soap after using and rinsing off       the CHG Soap.  9.  Pat yourself dry with a clean towel.            10.  Wear clean pajamas.            11.  Place clean sheets on your bed the night of your first  shower and do not        sleep with pets.  Day of Surgery  Shower as above. Do not apply any lotions/deodorants the morning of surgery.  Please wear clean clothes to the hospital.   Please read over the fact sheets that you were given.

## 2017-07-06 ENCOUNTER — Encounter (HOSPITAL_COMMUNITY)
Admission: RE | Disposition: A | Discharge status: Home / Self Care | Payer: Self-pay | Source: Ambulatory Visit | Attending: Otolaryngology

## 2017-07-06 ENCOUNTER — Ambulatory Visit (HOSPITAL_COMMUNITY)
Admission: RE | Admit: 2017-07-06 | Discharge: 2017-07-06 | Disposition: A | Discharge status: Home / Self Care | Payer: Medicaid Other | Source: Ambulatory Visit | Attending: Otolaryngology | Admitting: Otolaryngology

## 2017-07-06 ENCOUNTER — Ambulatory Visit (HOSPITAL_COMMUNITY): Payer: Medicaid Other | Admitting: Anesthesiology

## 2017-07-06 ENCOUNTER — Ambulatory Visit (HOSPITAL_COMMUNITY): Payer: Medicaid Other | Admitting: Emergency Medicine

## 2017-07-06 ENCOUNTER — Encounter (HOSPITAL_COMMUNITY): Payer: Self-pay | Admitting: Certified Registered"

## 2017-07-06 DIAGNOSIS — Z86010 Personal history of colonic polyps: Secondary | ICD-10-CM | POA: Diagnosis not present

## 2017-07-06 DIAGNOSIS — Z801 Family history of malignant neoplasm of trachea, bronchus and lung: Secondary | ICD-10-CM | POA: Insufficient documentation

## 2017-07-06 DIAGNOSIS — M170 Bilateral primary osteoarthritis of knee: Secondary | ICD-10-CM | POA: Insufficient documentation

## 2017-07-06 DIAGNOSIS — E785 Hyperlipidemia, unspecified: Secondary | ICD-10-CM | POA: Insufficient documentation

## 2017-07-06 DIAGNOSIS — R51 Headache: Secondary | ICD-10-CM | POA: Insufficient documentation

## 2017-07-06 DIAGNOSIS — C329 Malignant neoplasm of larynx, unspecified: Secondary | ICD-10-CM | POA: Diagnosis not present

## 2017-07-06 DIAGNOSIS — Z841 Family history of disorders of kidney and ureter: Secondary | ICD-10-CM | POA: Diagnosis not present

## 2017-07-06 DIAGNOSIS — Z808 Family history of malignant neoplasm of other organs or systems: Secondary | ICD-10-CM | POA: Diagnosis not present

## 2017-07-06 DIAGNOSIS — K219 Gastro-esophageal reflux disease without esophagitis: Secondary | ICD-10-CM | POA: Diagnosis not present

## 2017-07-06 DIAGNOSIS — Z800 Family history of malignant neoplasm of digestive organs: Secondary | ICD-10-CM | POA: Diagnosis not present

## 2017-07-06 DIAGNOSIS — J309 Allergic rhinitis, unspecified: Secondary | ICD-10-CM | POA: Insufficient documentation

## 2017-07-06 DIAGNOSIS — Z87891 Personal history of nicotine dependence: Secondary | ICD-10-CM | POA: Insufficient documentation

## 2017-07-06 DIAGNOSIS — Z923 Personal history of irradiation: Secondary | ICD-10-CM | POA: Diagnosis not present

## 2017-07-06 DIAGNOSIS — Z8249 Family history of ischemic heart disease and other diseases of the circulatory system: Secondary | ICD-10-CM | POA: Diagnosis not present

## 2017-07-06 DIAGNOSIS — Z8042 Family history of malignant neoplasm of prostate: Secondary | ICD-10-CM | POA: Insufficient documentation

## 2017-07-06 HISTORY — PX: DIRECT LARYNGOSCOPY: SHX5326

## 2017-07-06 SURGERY — LARYNGOSCOPY, DIRECT
Anesthesia: General | Site: Mouth

## 2017-07-06 MED ORDER — EPINEPHRINE HCL (NASAL) 0.1 % NA SOLN
NASAL | Status: AC
  Filled 2017-07-06: qty 30

## 2017-07-06 MED ORDER — PROPOFOL 10 MG/ML IV BOLUS
INTRAVENOUS | Status: AC
  Filled 2017-07-06: qty 20

## 2017-07-06 MED ORDER — LABETALOL HCL 5 MG/ML IV SOLN
INTRAVENOUS | Status: AC
  Filled 2017-07-06: qty 4

## 2017-07-06 MED ORDER — LIDOCAINE HCL (CARDIAC) 20 MG/ML IV SOLN
INTRAVENOUS | Status: AC
Start: 2017-07-06 — End: ?
  Filled 2017-07-06: qty 5

## 2017-07-06 MED ORDER — PROPOFOL 10 MG/ML IV BOLUS
INTRAVENOUS | Status: DC | PRN
  Administered 2017-07-06 (×2): 50 mg via INTRAVENOUS

## 2017-07-06 MED ORDER — FENTANYL CITRATE (PF) 250 MCG/5ML IJ SOLN
INTRAMUSCULAR | Status: AC
  Filled 2017-07-06: qty 5

## 2017-07-06 MED ORDER — SUCCINYLCHOLINE CHLORIDE 200 MG/10ML IV SOSY
PREFILLED_SYRINGE | INTRAVENOUS | Status: AC
  Filled 2017-07-06: qty 10

## 2017-07-06 MED ORDER — EPINEPHRINE HCL (NASAL) 0.1 % NA SOLN
NASAL | Status: DC | PRN
  Administered 2017-07-06: 30 mL via TOPICAL

## 2017-07-06 MED ORDER — DEXAMETHASONE SODIUM PHOSPHATE 10 MG/ML IJ SOLN
INTRAMUSCULAR | Status: AC
  Filled 2017-07-06: qty 1

## 2017-07-06 MED ORDER — MIDAZOLAM HCL 5 MG/5ML IJ SOLN
INTRAMUSCULAR | Status: DC | PRN
  Administered 2017-07-06: 2 mg via INTRAVENOUS

## 2017-07-06 MED ORDER — DEXAMETHASONE SODIUM PHOSPHATE 10 MG/ML IJ SOLN
INTRAMUSCULAR | Status: DC | PRN
  Administered 2017-07-06: 10 mg via INTRAVENOUS

## 2017-07-06 MED ORDER — ONDANSETRON HCL 4 MG/2ML IJ SOLN
INTRAMUSCULAR | Status: AC
  Filled 2017-07-06: qty 2

## 2017-07-06 MED ORDER — LIDOCAINE-EPINEPHRINE 1 %-1:100000 IJ SOLN
INTRAMUSCULAR | Status: AC
  Filled 2017-07-06: qty 1

## 2017-07-06 MED ORDER — LABETALOL HCL 5 MG/ML IV SOLN
5.0000 mg | INTRAVENOUS | Status: AC | PRN
  Administered 2017-07-06 (×4): 5 mg via INTRAVENOUS

## 2017-07-06 MED ORDER — MIDAZOLAM HCL 2 MG/2ML IJ SOLN
INTRAMUSCULAR | Status: AC
  Filled 2017-07-06: qty 2

## 2017-07-06 MED ORDER — LABETALOL HCL 5 MG/ML IV SOLN
10.0000 mg | INTRAVENOUS | Status: AC | PRN
  Administered 2017-07-06: 5 mg via INTRAVENOUS
  Administered 2017-07-06: 10 mg via INTRAVENOUS

## 2017-07-06 MED ORDER — LACTATED RINGERS IV SOLN
INTRAVENOUS | Status: DC
  Administered 2017-07-06 (×2): via INTRAVENOUS

## 2017-07-06 MED ORDER — SUCCINYLCHOLINE CHLORIDE 20 MG/ML IJ SOLN
INTRAMUSCULAR | Status: DC | PRN
  Administered 2017-07-06: 140 mg via INTRAVENOUS

## 2017-07-06 MED ORDER — ONDANSETRON HCL 4 MG/2ML IJ SOLN
INTRAMUSCULAR | Status: DC | PRN
  Administered 2017-07-06: 4 mg via INTRAVENOUS

## 2017-07-06 MED ORDER — FENTANYL CITRATE (PF) 100 MCG/2ML IJ SOLN
INTRAMUSCULAR | Status: DC | PRN
  Administered 2017-07-06 (×2): 50 ug via INTRAVENOUS

## 2017-07-06 MED ORDER — 0.9 % SODIUM CHLORIDE (POUR BTL) OPTIME
TOPICAL | Status: DC | PRN
  Administered 2017-07-06: 1000 mL

## 2017-07-06 MED ORDER — LIDOCAINE HCL (CARDIAC) 20 MG/ML IV SOLN
INTRAVENOUS | Status: DC | PRN
  Administered 2017-07-06: 100 mg via INTRAVENOUS

## 2017-07-06 SURGICAL SUPPLY — 23 items
CANISTER SUCT 3000ML PPV (MISCELLANEOUS) ×3 IMPLANT
CONT SPEC 4OZ CLIKSEAL STRL BL (MISCELLANEOUS) ×3 IMPLANT
COVER BACK TABLE 60X90IN (DRAPES) ×3 IMPLANT
COVER MAYO STAND STRL (DRAPES) ×3 IMPLANT
DRAPE HALF SHEET 40X57 (DRAPES) ×3 IMPLANT
GAUZE SPONGE 4X4 16PLY XRAY LF (GAUZE/BANDAGES/DRESSINGS) ×3 IMPLANT
GLOVE BIOGEL PI IND STRL 7.5 (GLOVE) ×1 IMPLANT
GLOVE BIOGEL PI INDICATOR 7.5 (GLOVE) ×2
GLOVE ECLIPSE 7.5 STRL STRAW (GLOVE) ×3 IMPLANT
GLOVE ECLIPSE 8.0 STRL XLNG CF (GLOVE) ×3 IMPLANT
GLOVE SURG SS PI 7.0 STRL IVOR (GLOVE) ×3 IMPLANT
GOWN STRL REUS W/ TWL XL LVL3 (GOWN DISPOSABLE) ×1 IMPLANT
GOWN STRL REUS W/TWL XL LVL3 (GOWN DISPOSABLE) ×2
GUARD TEETH (MISCELLANEOUS) IMPLANT
KIT BASIN OR (CUSTOM PROCEDURE TRAY) ×3 IMPLANT
KIT TURNOVER KIT B (KITS) ×3 IMPLANT
NS IRRIG 1000ML POUR BTL (IV SOLUTION) ×3 IMPLANT
PAD ARMBOARD 7.5X6 YLW CONV (MISCELLANEOUS) ×6 IMPLANT
PATTIES SURGICAL .5 X3 (DISPOSABLE) ×3 IMPLANT
SOLUTION ANTI FOG 6CC (MISCELLANEOUS) IMPLANT
TOWEL OR 17X26 10 PK STRL BLUE (TOWEL DISPOSABLE) ×3 IMPLANT
TUBE CONNECTING 12'X1/4 (SUCTIONS) ×1
TUBE CONNECTING 12X1/4 (SUCTIONS) ×2 IMPLANT

## 2017-07-06 NOTE — Discharge Instructions (Signed)
We will call you next week with biopsy results.

## 2017-07-06 NOTE — Op Note (Signed)
OPERATIVE REPORT  DATE OF SURGERY: 07/06/2017  PATIENT:  Jonathan [name],  57 y.o. male  PRE-OPERATIVE DIAGNOSIS:  Laryngeal Cancer  POST-OPERATIVE DIAGNOSIS: Laryngeal cancer  PROCEDURE:  Procedure(s): DIRECT LARYNGOSCOPY, with biopsy  SURGEON:  Beckie Salts, MD  ASSISTANTS: None  ANESTHESIA:   General   EBL: 5 ml  DRAINS: None  LOCAL MEDICATIONS USED:  None  SPECIMEN: Laryngeal biopsy, right  COUNTS:  Correct  PROCEDURE DETAILS: The patient was taken to the operating room and placed on the operating table in the supine position.   Following induction of general anesthesia, the table was turned and I performed a direct laryngoscopy with a Jako laryngoscope to do the initial intubation., 6.5 tube was passed through the glottis easily and ventilation was.  The morning scope was removed and then replaced outside of the endotracheal tube and the larynx was inspected.  There was fullness and some fibrosis of the right cord and supraglottic larynx.  There was some small areas of friable tissue in the anterior supraglottis.  Multiple biopsies were taken from multiple areas including the anterior supraglottis, glottis and the mid and posterior supraglottic larynx.  There were all sent together for pathologic evaluation.  Topical adrenaline was applied and pledgets for hemostasis.  The scope was removed.  Care of the patient was then returned to anesthesia where he was awakened from anesthesia and extubated without incident.    PATIENT DISPOSITION:  To PACU, stable

## 2017-07-06 NOTE — Anesthesia Postprocedure Evaluation (Signed)
Anesthesia Post Note  Patient: Jonathan [name]  Procedure(s) Performed: DIRECT LARYNGOSCOPY (N/A Mouth)     Patient location during evaluation: PACU Anesthesia Type: General Level of consciousness: awake and alert Pain management: pain level controlled Vital Signs Assessment: post-procedure vital signs reviewed and stable Respiratory status: spontaneous breathing, nonlabored ventilation, respiratory function stable and patient connected to nasal cannula oxygen Cardiovascular status: blood pressure returned to baseline and stable Postop Assessment: no apparent nausea or vomiting Anesthetic complications: no    Last Vitals:  Vitals:   07/06/17 1106 07/06/17 1118  BP: (!) 157/107 (!) 135/100  Pulse: 90 88  Resp:    Temp:    SpO2: 98% 99%    Last Pain:  Vitals:   07/06/17 1030  TempSrc:   PainSc: 0-No pain                 Slayter Moorhouse EDWARD

## 2017-07-06 NOTE — Anesthesia Procedure Notes (Signed)
Procedure Name: Intubation Date/Time: 07/06/2017 8:49 AM Performed by: Babs Bertin, CRNA Pre-anesthesia Checklist: Patient identified, Emergency Drugs available, Suction available and Patient being monitored Patient Re-evaluated:Patient Re-evaluated prior to induction Oxygen Delivery Method: Circle System Utilized Preoxygenation: Pre-oxygenation with 100% oxygen Induction Type: IV induction Ventilation: Mask ventilation without difficulty Laryngoscope size: DL by Dr. Constance Holster, see op note. Tube type: Oral Tube size: 6.5 mm Number of attempts: 1 Airway Equipment and Method: Stylet and Oral airway Placement Confirmation: ETT inserted through vocal cords under direct vision,  positive ETCO2 and breath sounds checked- equal and bilateral Secured at: 22 cm Dental Injury: Teeth and Oropharynx as per pre-operative assessment

## 2017-07-06 NOTE — Transfer of Care (Signed)
Immediate Anesthesia Transfer of Care Note  Patient: Jonathan [name]  Procedure(s) Performed: DIRECT LARYNGOSCOPY (N/A Mouth)  Patient Location: PACU  Anesthesia Type:General  Level of Consciousness: responds to stimulation  Airway & Oxygen Therapy: Patient Spontanous Breathing and Patient connected to face mask oxygen  Post-op Assessment: Report given to RN and Post -op Vital signs reviewed and stable  Post vital signs: Reviewed and stable  Last Vitals:  Vitals Value Taken Time  BP 190/130 07/06/2017  9:20 AM  Temp    Pulse 108 07/06/2017  9:20 AM  Resp 21 07/06/2017  9:20 AM  SpO2 100 % 07/06/2017  9:20 AM  Vitals shown include unvalidated device data.  Last Pain:  Vitals:   07/06/17 0734  TempSrc:   PainSc: 3          Complications: No apparent anesthesia complications

## 2017-07-06 NOTE — Anesthesia Preprocedure Evaluation (Signed)
Anesthesia Evaluation  Patient identified by MRN, date of birth, ID band Patient awake    Reviewed: Allergy & Precautions, NPO status , Patient's Chart, lab work & pertinent test results  History of Anesthesia Complications Negative for: history of anesthetic complications  Airway Mallampati: Trach   Neck ROM: Full    Dental  (+) Dental Advisory Given, Poor Dentition   Pulmonary former smoker,  Laryngeal cancer: trach   breath sounds clear to auscultation       Cardiovascular negative cardio ROS   Rhythm:Regular Rate:Normal     Neuro/Psych negative neurological ROS     GI/Hepatic Neg liver ROS, GERD  Controlled,  Endo/Other  negative endocrine ROS  Renal/GU negative Renal ROS     Musculoskeletal  (+) Arthritis , Osteoarthritis,    Abdominal   Peds  Hematology negative hematology ROS (+)   Anesthesia Other Findings   Reproductive/Obstetrics                             Anesthesia Physical  Anesthesia Plan  ASA: II  Anesthesia Plan: General   Post-op Pain Management:    Induction: Intravenous  PONV Risk Score and Plan:   Airway Management Planned: Oral ETT and Tracheostomy  Additional Equipment:   Intra-op Plan:   Post-operative Plan: Extubation in OR  Informed Consent: I have reviewed the patients History and Physical, chart, labs and discussed the procedure including the risks, benefits and alternatives for the proposed anesthesia with the patient or authorized representative who has indicated his/her understanding and acceptance.     Plan Discussed with: CRNA and Surgeon  Anesthesia Plan Comments: ( Last GA Tracheostomy; Reinforced; 6.5 mm; Cuffed)        Anesthesia Quick Evaluation

## 2017-07-06 NOTE — Progress Notes (Signed)
Pt BP 157/107 Dr Lyndle Herrlich notifed new orders obtained and carried out pt mau d/c home after 10mg  Labetalol given iv no further rx

## 2017-07-06 NOTE — Interval H&P Note (Signed)
History and Physical Interval Note:  07/06/2017 8:09 AM  [name]  has presented today for surgery, with the diagnosis of Laryngeal Cancer  The various methods of treatment have been discussed with the patient and family. After consideration of risks, benefits and other options for treatment, the patient has consented to  Procedure(s) with comments: DIRECT LARYNGOSCOPY (N/A) - With biopsy possible tracheostomy as a surgical intervention .  The patient's history has been reviewed, patient examined, no change in status, stable for surgery.  I have reviewed the patient's chart and labs.  Questions were answered to the patient's satisfaction.     Jonathan [name]

## 2017-07-06 NOTE — Progress Notes (Signed)
Discussed with pt importance of talking with his medical doctor regarding his BP he understands a d agreees to contact medical doctor

## 2017-07-07 ENCOUNTER — Encounter (HOSPITAL_COMMUNITY): Payer: Self-pay | Admitting: Otolaryngology

## 2017-07-09 ENCOUNTER — Encounter: Payer: Self-pay | Admitting: Internal Medicine

## 2017-07-09 ENCOUNTER — Ambulatory Visit: Payer: Medicaid Other | Admitting: Internal Medicine

## 2017-07-09 VITALS — BP 121/88 | HR 99 | Temp 98.3°F | Wt 122.2 lb

## 2017-07-09 DIAGNOSIS — Z9221 Personal history of antineoplastic chemotherapy: Secondary | ICD-10-CM | POA: Diagnosis not present

## 2017-07-09 DIAGNOSIS — G8928 Other chronic postprocedural pain: Secondary | ICD-10-CM | POA: Insufficient documentation

## 2017-07-09 DIAGNOSIS — R030 Elevated blood-pressure reading, without diagnosis of hypertension: Secondary | ICD-10-CM

## 2017-07-09 DIAGNOSIS — M542 Cervicalgia: Secondary | ICD-10-CM | POA: Diagnosis not present

## 2017-07-09 DIAGNOSIS — Z923 Personal history of irradiation: Secondary | ICD-10-CM

## 2017-07-09 DIAGNOSIS — Y842 Radiological procedure and radiotherapy as the cause of abnormal reaction of the patient, or of later complication, without mention of misadventure at the time of the procedure: Secondary | ICD-10-CM | POA: Insufficient documentation

## 2017-07-09 DIAGNOSIS — H6121 Impacted cerumen, right ear: Secondary | ICD-10-CM | POA: Diagnosis not present

## 2017-07-09 DIAGNOSIS — Z79891 Long term (current) use of opiate analgesic: Secondary | ICD-10-CM | POA: Diagnosis not present

## 2017-07-09 DIAGNOSIS — Z8521 Personal history of malignant neoplasm of larynx: Secondary | ICD-10-CM | POA: Diagnosis not present

## 2017-07-09 MED ORDER — OXYCODONE-ACETAMINOPHEN 5-325 MG PO TABS: 1.0000 | ORAL_TABLET | Freq: Two times a day (BID) | ORAL | 0 refills | Status: DC | PRN

## 2017-07-09 MED FILL — OXYCODONE-ACETAMINOPHEN 5-3: 5-325 | 30 days supply | Qty: 60 | Fill #0

## 2017-07-09 NOTE — Progress Notes (Signed)
   CC: Blood pressure follow up   HPI:  [name] is a 57 y.o. male with past medical history outlined below here for follow up of his blood pressure. For the details of today's visit, please refer to the assessment and plan.  Past Medical History:  Diagnosis Date  . Allergic rhinitis   . Arthritis    Bilateral knee arthritis for about 20 years. Not taking any medications, believes in nature processes  . Cancer (HCC)    larynx  . Colon polyp   . Dental caries    prechemoradiation therapy, periodontitis  . GERD (gastroesophageal reflux disease)   . Headache   . History of radiation therapy 09/11/16- 10/30/16   Larynx 70 Gy in 35 fractions  . HLD (hyperlipidemia)   . Pneumonia    as a child    Review of Systems  Constitutional: Negative for chills and fever.  Respiratory: Negative for shortness of breath.   Cardiovascular: Negative for chest pain.    Physical Exam:  Vitals:   07/09/17 1521  BP: 121/88  Pulse: 99  Temp: 98.3 F (36.8 C)  TempSrc: Oral  SpO2: 98%  Weight: 122 lb 3.2 oz (55.4 kg)   Constitutional: Thin, NAD, appears comfortable Neck: Supple, trachea midline. No lymphadenopathy. Open tracheostomy site appears to be healing well, no drainage or erythema. Cardiovascular: RRR, no murmurs, rubs, or gallops.  Pulmonary/Chest: CTAB, no wheezes, rales, or rhonchi.  Neuro: CN II-XII grossly intact. EOMI. Strength intact and symmetric bilaterally. No gait ataxia. No dysmetria on finger to nose.  Psychiatric: Normal mood and affect   Assessment & Plan:   See Encounters Tab for problem based charting.  Patient discussed with Dr. Eppie Gibson

## 2017-07-09 NOTE — Patient Instructions (Signed)
FOLLOW-UP INSTRUCTIONS When: 1-2 weeks For: BP follow up ([facility] - do not schedule 15th or 16th, I will be out of clinic) What to bring: BP log  Jonathan [name],  It was a pleasure to see you today. I am glad you are doing well. Please check your blood pressure twice a day for the next 1-2 weeks until you follow up with me in clinic. Please record the values so we can review them together. For your ear & neck pain, I have given you a prescription for percocet. You may take this as needed for pain. If you have any questions or concerns, call our clinic at [phone] or after hours call [phone] and ask for the internal medicine resident on call. Thank you!  - Dr. Philipp Ovens

## 2017-07-10 ENCOUNTER — Encounter: Payer: Self-pay | Admitting: Internal Medicine

## 2017-07-10 DIAGNOSIS — H6120 Impacted cerumen, unspecified ear: Secondary | ICD-10-CM | POA: Insufficient documentation

## 2017-07-10 NOTE — Progress Notes (Signed)
Case discussed with Dr. Guilloud at the time of the visit.  We reviewed the resident's history and exam and pertinent patient test results.  I agree with the assessment, diagnosis and plan of care documented in the resident's note. 

## 2017-07-10 NOTE — Assessment & Plan Note (Signed)
Patient is complaining of right ear pain.  This is also associated with chronic neck pain that I think is likely related to post radiation changes following treatment for his supraglottic squamous cell carcinoma requiring tracheostomy.  However on exam today, tympanic membrane was unable to be visualized due to significant cerumen impaction.   He reported some improvement in his ear pain and pressure following irrigation and partial removal of cerumen.  Advised patient to purchase over-the-counter earwax cleaning solutions.  -- Follow up as needed if pain persists

## 2017-07-10 NOTE — Assessment & Plan Note (Signed)
Patient is here to follow-up for elevated blood pressure readings during recent hospitalization for laryngeal biopsy.  Blood pressure today is 121/88.  Interestingly, on chart review of his vitals, it appears that he has very labile blood pressures ranging from low 100s up to as high as 449 systolic.  It is possible he is experiencing whitecoat hypertension, however he denies excessive worry or anxiety surrounding his doctors appointments.  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  However orthostatics today were normal (lying BP 144/98, HR 90 --> sitting BP 141/101, HR 96, standing BP 132/97, HR 102).  Interestingly his systolic blood pressure did increase to the 140s on recheck.  Unclear reason for labile blood pressures.  Patient is agreeable to monitoring blood pressure at home twice daily for the next 1-2 weeks.  I have provided him with a blood pressure log.  He will follow-up with me to review his recordings. -- Provided home blood pressure log, to be checked twice daily x 1-2 weeks --Follow up with log

## 2017-07-10 NOTE — Assessment & Plan Note (Signed)
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  He was recently admitted for biopsy due to concern for possible recurrence on CT.  Path results are still pending.  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  He reports his pain is severe and unrelieved with prn NSAIDs.  I have agreed to prescribe Percocet 5-325 mg twice daily as needed.  The database was checked today, last prescription was filled in July 2018.  If patient requires long-term opioids, I will plan to have him sign a controlled substance contract with me and obtain UDS. -- Percocet 5-325 mg BID prn (#60)

## 2017-07-11 ENCOUNTER — Other Ambulatory Visit: Payer: Self-pay

## 2017-07-11 ENCOUNTER — Telehealth: Payer: Self-pay | Admitting: Internal Medicine

## 2017-07-11 NOTE — Telephone Encounter (Signed)
Left message for pt to call back  °

## 2017-07-12 NOTE — Telephone Encounter (Signed)
Pt states he will find out his path results from ENT tomorrow when he sees Dr. Alvy Bimler. Pt will call and let us know if he needs to reschedule his colon after he meets with Dr. Alvy Bimler tomorrow. Let pt know there would not be a cancellation fee if he needed to cancel the appointment.

## 2017-07-13 ENCOUNTER — Other Ambulatory Visit: Payer: Self-pay

## 2017-07-13 ENCOUNTER — Inpatient Hospital Stay: Payer: Medicaid Other | Attending: Hematology and Oncology | Admitting: Hematology and Oncology

## 2017-07-13 ENCOUNTER — Encounter: Payer: Self-pay | Admitting: Hematology and Oncology

## 2017-07-13 DIAGNOSIS — R030 Elevated blood-pressure reading, without diagnosis of hypertension: Secondary | ICD-10-CM | POA: Diagnosis not present

## 2017-07-13 DIAGNOSIS — C321 Malignant neoplasm of supraglottis: Secondary | ICD-10-CM | POA: Diagnosis not present

## 2017-07-13 NOTE — Assessment & Plan Note (Signed)
His blood pressure is elevated but could be due to anxiety I will monitor closely

## 2017-07-13 NOTE — Progress Notes (Signed)
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, repeat biopsy confirmed cancer recurrence Unfortunately, his ENT surgeon is not available for discussion until next week I reviewed with him the current guidelines I recommend surgical resection if possible but he will lose his voice box We discussed the risk, benefits, side effects of surgical resection with curative intent versus palliative chemotherapy The patient is undecided I recommend he consult with ENT surgeon next week If he desire for surgery, I will see him back within a month postoperatively to discuss potential need for further chemotherapy if needed  Elevated BP without diagnosis of hypertension His blood pressure is elevated but could be due to anxiety I will monitor closely    No orders of the defined types were placed in this encounter.   INTERVAL HISTORY: Please see below for problem oriented charting. 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 Epicenter at the glottis on the right. Extension across midline as well as involvement of laryngeal cartilages (possibly also bilateral). 2. Relatively small but malignant appearing right level 2 lymphadenopathy. Suspicious contralateral left level 2 lymph nodes.      04/21/2016 Initial Diagnosis    He saw Dr. Constance Holster in the clinic for  chronic hoarseness. In the office, it was noted that the posterior soft palate, uvula, tongue base and vallecula were visualized and appeared healthy without mucosal masses or lesions. There is a large mass involving the right side of the larynx, the right cord is fixed. There may be involvement of the post cricoid area it is difficult to say for sure. 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 Clinically this is at least staged as T3. The airway is significantly impaired. He is at risk for impending airway obstruction. ENT recommended him for awake tracheostomy with direct laryngoscopy and biopsy and esophagoscopy under anesthesia immediately following that.       04/24/2016 Pathology Results    Larynx, biopsy, Right Side - POSITIVE FOR INVASIVE SQUAMOUS CELL CARCINOMA. - SEE COMMENT. Microscopic Comment As sampled, the squamous cell carcinoma appears well differentiated. A fragment of benign cartilage is also present in the specimen.      04/24/2016 Surgery    She underwent direct laryngoscopy with biopsy. An anterior commissure scope was used to evaluate the larynx. A large mass and, using the right side of the larynx and the anterior commissure area was identified. Multiple biopsies were taken. The left cord was swollen but seemed to be uninvolved. Piriforms appeared to be clear. The post cricoid area appeared clear. Esophagoscopy was difficult and ENT surgeon was unable to pass the scope into the esophagus. He had tracheostomy      04/24/2016 Pathology Results    Larynx, biopsy, Right Side - POSITIVE FOR INVASIVE SQUAMOUS CELL CARCINOMA. - SEE COMMENT. Microscopic Comment As sampled, the squamous cell carcinoma appears well differentiated. A fragment of benign cartilage is also  present in the specimen.       05/15/2016 PET scan    Right eccentric glottic mass, appears to cross the anterior commissure with destructive findings of the thyroid  cartilage. There is an upper normal size right station 2A lymph node with maximum SUV of 4.2 which is only very faintly elevated over background, but which could possibly represent a small amount of local metastatic spread. 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 2. Asymmetric calcification along the head of the right caudate nucleus and adjacent lentiform nucleus. Well this can sometimes be physiologic, there is some accompanying reduction in PET activity, and the asymmetry is an unusual feature. Accordingly I recommend MRI of the brain with and without contrast for further characterization. 3. A AP window lymph node is minimally enlarged at 1.1 cm, but with a maximum SUV below background mediastinal levels, likely benign.      05/23/2016 Imaging    MRI brain: Atrophy with chronic microvascular ischemic change. No intracranial metastatic disease. 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       05/26/2016 Surgery    Multiple extraction of tooth numbers 2, 5, 6, 8-13, and  21-28. 2. 4 Quadrants of alveoloplasty. 3. Bilateral mandibular lingual tori reductions 4. Maxillary right, maxillary left, and mandibular left buccal exostoses reductions      06/02/2016 Procedure    Successful placement of a right internal jugular approach power injectable Port-A-Cath. The catheter is ready for immediate use      06/09/2016 - 08/22/2016 Chemotherapy    He received induction chemotherapy with taxotere, cisplatin and 5 FU x 4 cycles      07/31/2016 PET scan    Mild asymmetric soft tissue prominence along the right supraglottic larynx, without hypermetabolism, likely corresponding to treated tumor. No findings suspicious for metastatic disease. Small mediastinal and left axillary nodes, without  associated hypermetabolism.      09/11/2016 - 10/30/2016 Radiation Therapy    He received radiation Radiation treatment dates:   09/11/16-10/30/16  Site/dose:   Larynx and b/l neck, 70 Gy in 35 fractions  Beams/energy:   IMRT, 6X       01/01/2017 Imaging    CT neck: Asymmetric soft tissue prominence in the right supraglottic region extending down to the glottis on the right. This appears similar to the previous PET scan at which time there was not any hypermetabolism in the region. Therefore, this could be chronic post treatment change. However, there does appear to be some enhancing tissue and I cannot exclude the possibility of residual or recurrent tumor on the basis of this exam. There is no evidence of any nodal disease.      05/04/2017 Procedure    Trachestomy was removed      06/19/2017 Imaging    MRI brain 1. Abnormal enhancement versus intrinsic density RIGHT basal ganglia, subacute infarct or metastasis possible. 2. More conspicuous 8 x 10 mm nodular enhancement RIGHT supraglottic fat at site of primary tumor associated with vocal cord paralysis. Recurrent tumor suspected. Recommend direct inspection. 3. Post radiation changes of the neck including sialoadenitis. Worsening supraglottic edema. 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  4. Interval tracheostomy tube removal.      06/21/2017 Imaging    MRI brain 1. No evidence of intracranial metastases or acute abnormality. 2. Large  inferior right frontal developmental venous anomaly with mildly increased adjacent gliosis. Basal ganglia mineralization accounts for the density on CT.      06/22/2017 PET scan    1. There is continued soft tissue prominence along the right glottic and supraglottic region but without significant accentuated hypermetabolic activity to further suggest recurrence. No hypermetabolic adenopathy or other lesion to suggest active malignancy at this time. 2. Stable  appearance of hyperdensity along the right basal ganglia attributed to underlying developmental venous anomaly based on prior MRI workup.  3. Other imaging findings of potential clinical significance: Aortic Atherosclerosis (ICD10-I70.0). Coronary atherosclerosis.      07/06/2017 Surgery    He had direct laryngoscopy and biopsy      07/11/2017 Pathology Results    Biopsy confirmed invasive squamous cell carcinoma       REVIEW OF SYSTEMS:   Constitutional: Denies fevers, chills or abnormal weight loss Eyes: Denies blurriness of vision Ears, nose, mouth, throat, and face: Denies mucositis or sore throat Respiratory: Denies cough, dyspnea or wheezes Cardiovascular: Denies palpitation, chest discomfort or lower extremity swelling Gastrointestinal:  Denies nausea, heartburn or change in bowel habits Skin: Denies abnormal skin rashes Lymphatics: Denies new lymphadenopathy or easy bruising Neurological:Denies numbness, tingling or new weaknesses Behavioral/Psych: Mood is stable, no new changes  All other systems were reviewed with the patient and are negative.  I have reviewed the past medical history, past surgical history, social history and family history with the patient and they are unchanged from previous note.  ALLERGIES:  has No Known Allergies.  MEDICATIONS:  Current Outpatient Medications  Medication Sig Dispense Refill  . ibuprofen (ADVIL,MOTRIN) 200 MG tablet Take 200 mg by mouth daily as needed for headache or moderate pain.    Marland Kitchen oxyCODONE-acetaminophen (PERCOCET/ROXICET) 5-325 MG tablet Take 1 tablet by mouth 2 (two) times daily as needed for severe pain. 60 tablet 0  . SUPREP BOWEL PREP KIT 17.5-3.13-1.6 GM/177ML SOLN Take 1 kit by mouth as directed. 354 mL 0   No current facility-administered medications for this visit.     PHYSICAL EXAMINATION: ECOG PERFORMANCE STATUS: 0 - Asymptomatic  Vitals:   07/13/17 1342 07/13/17 1343  BP: (!) 144/106 (!) 139/107  Pulse:  (!) 103   Resp: 18   Temp: 99 F (37.2 C)   SpO2: 97%    Filed Weights   07/13/17 1342  Weight: 120 lb 12.8 oz (54.8 kg)    GENERAL:alert, no distress and comfortable Musculoskeletal:no cyanosis of digits and no clubbing  NEURO: alert & oriented x 3 with fluent speech, no focal motor/sensory deficits  LABORATORY DATA:  I have reviewed the data as listed    Component Value Date/Time   NA 140 04/12/2017 1405   NA 141 01/04/2017 1432   K 3.9 04/12/2017 1405   K 4.1 01/04/2017 1432   CL 106 04/12/2017 1405   CO2 26 04/12/2017 1405   CO2 27 01/04/2017 1432   GLUCOSE 89 04/12/2017 1405   GLUCOSE 93 01/04/2017 1432   BUN 12 04/12/2017 1405   BUN 12.2 01/04/2017 1432   CREATININE 0.95 04/12/2017 1405   CREATININE 0.8 01/04/2017 1432   CALCIUM 9.5 04/12/2017 1405   CALCIUM 9.8 01/04/2017 1432   PROT 7.7 04/12/2017 1405   PROT 6.9 01/04/2017 1432   ALBUMIN 4.2 04/12/2017 1405   ALBUMIN 3.8 01/04/2017 1432   AST 16 04/12/2017 1405   AST 18 01/04/2017 1432   ALT 11 04/12/2017 1405   ALT 12 01/04/2017 1432  ALKPHOS 81 04/12/2017 1405   ALKPHOS 67 01/04/2017 1432   BILITOT 0.4 04/12/2017 1405   BILITOT 0.23 01/04/2017 1432   GFRNONAA >60 04/12/2017 1405   GFRNONAA 82 03/06/2011 0853   GFRAA >60 04/12/2017 1405   GFRAA >89 03/06/2011 0853    No results found for: SPEP, UPEP  Lab Results  Component Value Date   WBC 4.2 07/04/2017   NEUTROABS 2.9 04/12/2017   HGB 14.8 07/04/2017   HCT 45.2 07/04/2017   MCV 92.8 07/04/2017   PLT 248 07/04/2017      Chemistry      Component Value Date/Time   NA 140 04/12/2017 1405   NA 141 01/04/2017 1432   K 3.9 04/12/2017 1405   K 4.1 01/04/2017 1432   CL 106 04/12/2017 1405   CO2 26 04/12/2017 1405   CO2 27 01/04/2017 1432   BUN 12 04/12/2017 1405   BUN 12.2 01/04/2017 1432   CREATININE 0.95 04/12/2017 1405   CREATININE 0.8 01/04/2017 1432      Component Value Date/Time   CALCIUM 9.5 04/12/2017 1405   CALCIUM 9.8  01/04/2017 1432   ALKPHOS 81 04/12/2017 1405   ALKPHOS 67 01/04/2017 1432   AST 16 04/12/2017 1405   AST 18 01/04/2017 1432   ALT 11 04/12/2017 1405   ALT 12 01/04/2017 1432   BILITOT 0.4 04/12/2017 1405   BILITOT 0.23 01/04/2017 1432       RADIOGRAPHIC STUDIES: I have personally reviewed the radiological images as listed and agreed with the findings in the report. Mr Jeri Cos Wo Contrast  Result Date: 06/21/2017 CLINICAL DATA:  Abnormal appearance of the right basal ganglia on recent neck CT. History of supraglottic cancer. Evaluation for brain metastases. EXAM: MRI HEAD WITHOUT AND WITH CONTRAST TECHNIQUE: Multiplanar, multiecho pulse sequences of the brain and surrounding structures were obtained without and with intravenous contrast. CONTRAST:  2m MULTIHANCE GADOBENATE DIMEGLUMINE 529 MG/ML IV SOLN COMPARISON:  Neck CT 06/11/2017.  Brain MRI 05/23/2016. FINDINGS: Brain: There is no evidence of acute infarct, mass, midline shift, or extra-axial fluid collection. Mild cerebral atrophy is unchanged from the prior MRI. Scattered small foci of T2 hyperintensity in the subcortical greater than deep cerebral white matter are similar to the prior MRI and nonspecific but compatible with mild chronic small vessel ischemic disease. No enhancing brain lesion suggestive of a metastasis is identified. A large developmental venous anomaly in the inferior right frontal lobe is unchanged from the prior MRI. Mild adjacent T2 hyperintensity has increased and may reflect gliosis. There is unchanged susceptibility artifact in the right caudate and anterior lentiform nuclei which accounts for the density on recent neck CT and most likely represents mineralization, possibly due to chronic venous ischemia in the territory drained by the DVA. Vascular: Major intracranial vascular flow voids are preserved. Skull and upper cervical spine: Unremarkable bone marrow signal. Sinuses/Orbits: Unremarkable orbits. Paranasal  sinuses and mastoid air cells are clear. Other: None. IMPRESSION: 1. No evidence of intracranial metastases or acute abnormality. 2. 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 Basal ganglia mineralization accounts for the density on CT. Electronically Signed   By: ALogan BoresM.D.   On: 06/21/2017 18:01   Nm Pet Image Restag (ps) Skull Base To Thigh  Result Date: 06/22/2017 CLINICAL DATA:  Subsequent treatment strategy for supraglottic head and neck cancer. EXAM: NUCLEAR MEDICINE PET SKULL BASE TO THIGH TECHNIQUE: 6.4 mCi F-18 FDG was injected intravenously. Full-ring PET imaging was performed  from the skull base to thigh after the radiotracer. CT data was obtained and used for attenuation correction and anatomic localization. Fasting blood glucose: 103 mg/dl Mediastinal blood pool activity: SUV max 2.0 COMPARISON:  07/31/2016 and 06/11/2017 CT FINDINGS: NECK: Right glottic and supraglottic fullness as noted on recent diagnostic CT, with maximum SUV in this vicinity about 3.6, formerly 3.2. There is some bilateral activity along the arytenoids, maximum SUV 3.8 on the right and 4.7 on the left. Overall this is not in the range of the originally diagnosed lesion which had a maximum SUV of 11.6, and is most likely physiologic and due to prior therapy although surveillance is likely warranted. The degree of narrowing along the glottis during imaging is increased from 07/31/2016, although this may be incidental. No hypermetabolic adenopathy is identified in the neck. Incidental CT findings: Note is again made of density along the right basal ganglia attributed to an underlying developmental venous anomaly based on prior MRI workup. Tracheostomy site noted. CHEST: AP window lymph node measures 0.8 cm in short axis with maximum SUV 2.2 (formerly 0.8 cm with maximum SUV 1.9). No overt hypermetabolic lesion in the chest. Distal esophageal activity with maximum SUV  4.2, likely physiologic. Incidental CT findings: Right Port-A-Cath tip: Cavoatrial junction. Left anterior descending and left main coronary artery atherosclerotic calcification. ABDOMEN/PELVIS: Physiologic activity in bowel. No significant abnormal metabolic lesion in the abdomen. Incidental CT findings: Aortoiliac atherosclerotic vascular disease. SKELETON: The previous generalized accentuated skeletal activity has resolved. No hypermetabolic skeletal lesion. Right antecubital is activity attributable to injection site. Incidental CT findings: none IMPRESSION: 1. There is continued soft tissue prominence along the right glottic and supraglottic region but without significant accentuated hypermetabolic activity to further suggest recurrence. No hypermetabolic adenopathy or other lesion to suggest active malignancy at this time. 2. Stable appearance of hyperdensity along the right basal ganglia attributed to underlying developmental venous anomaly based on prior MRI workup. 3. Other imaging findings of potential clinical significance: Aortic Atherosclerosis (ICD10-I70.0). Coronary atherosclerosis. Electronically Signed   By: Van Clines M.D.   On: 06/22/2017 09:05    All questions were answered. The patient knows to call the clinic with any problems, questions or concerns. No barriers to learning was detected.  I spent 20 minutes counseling the patient face to face. The total time spent in the appointment was 25 minutes and more than 50% was on counseling and review of test results  Heath Lark, MD 07/13/2017 3:28 PM

## 2017-07-13 NOTE — Assessment & Plan Note (Signed)
Unfortunately, repeat biopsy confirmed cancer recurrence Unfortunately, his ENT surgeon is not available for discussion until next week I reviewed with him the current guidelines I recommend surgical resection if possible but he will lose his voice box We discussed the risk, benefits, side effects of surgical resection with curative intent versus palliative chemotherapy The patient is undecided I recommend he consult with ENT surgeon next week If he desire for surgery, I will see him back within a month postoperatively to discuss potential need for further chemotherapy if needed

## 2017-07-16 ENCOUNTER — Telehealth: Payer: Self-pay

## 2017-07-16 ENCOUNTER — Telehealth: Payer: Self-pay | Admitting: Internal Medicine

## 2017-07-16 ENCOUNTER — Encounter: Payer: Medicaid Other | Admitting: Internal Medicine

## 2017-07-16 NOTE — Telephone Encounter (Signed)
Called Dr. Janeice Robinson office and talk with staff member. Gave Dr. Alvy Bimler cell phone # and ask Dr. Constance Holster to call her regarding this patient. They will give him the message.

## 2017-07-16 NOTE — Telephone Encounter (Signed)
-----   Message from Heath Lark, MD sent at 07/16/2017  9:47 AM EDT ----- Regarding: Page Dr. Constance Holster Need to talk to him about this patient

## 2017-07-23 NOTE — H&P (Signed)
Jonathan [name] is an 57 y.o. male.   Chief Complaint: Laryngeal cancer history of HPI: T4 supraglottic laryngeal cancer, status post BX/radiation therapy, with partial response to treatment, persistent disease identified on recent biopsy.  Past Medical History:  Diagnosis Date  . Allergic rhinitis   . Arthritis    Bilateral knee arthritis for about 20 years. Not taking any medications, believes in nature processes  . Cancer (HCC)    larynx  . Colon polyp   . Dental caries    prechemoradiation therapy, periodontitis  . GERD (gastroesophageal reflux disease)   . Headache   . History of radiation therapy 09/11/16- 10/30/16   Larynx 70 Gy in 35 fractions  . HLD (hyperlipidemia)   . Pneumonia    as a child    Past Surgical History:  Procedure Laterality Date  . COLONOSCOPY  03/18/2012  . DIRECT LARYNGOSCOPY N/A 07/06/2017   Procedure: DIRECT LARYNGOSCOPY;  Surgeon: Izora Gala, MD;  Location: [facility];  Service: ENT;  Laterality: N/A;  . ESOPHAGOSCOPY  04/24/2016   Procedure: ESOPHAGOSCOPY;  Surgeon: Izora Gala, MD;  Location: [facility];  Service: ENT;;  . IR GENERIC HISTORICAL  06/02/2016   IR US GUIDE VASC ACCESS RIGHT 06/02/2016 Sandi Mariscal, MD [facility]-INTER[facility] RAD  . IR GENERIC HISTORICAL  06/02/2016   IR FLUORO GUIDE PORT INSERTION RIGHT 06/02/2016 Sandi Mariscal, MD [facility]INTER[facility]  . LARYNGOSCOPY  04/24/2016   Procedure: LARYNGOSCOPY;  Surgeon: Izora Gala, MD;  Location: [facility];  Service: ENT;;  . MULTIPLE EXTRACTIONS WITH ALVEOLOPLASTY N/A 05/26/2016   Procedure: Extraction of tooth #'s 2,5,6,8-13, 21-28 with alveoloplasty, bilateral mandibular tori reductions and buccal exostoses reductions of maxillary left, maxillary right, and mandibular left quadrants.;  Surgeon: Lenn Cal, DDS;  Location: [facility];  Service: Oral Surgery;  Laterality: N/A;  . TRACHEOSTOMY TUBE PLACEMENT N/A 04/24/2016   Procedure: TRACHEOSTOMY;  Surgeon: Izora Gala, MD;  Location[facility];  Service: ENT;  Laterality: N/A;     Family History  Problem Relation Age of Onset  . Prostate cancer Maternal Uncle 66       On radiation therapy now, diagnosed in around June 2012  . Colon cancer Maternal Uncle   . Heart attack Paternal Grandfather        Diet due to MI  . Lung cancer Paternal Aunt   . Brain cancer Paternal Aunt   . Kidney disease Maternal Uncle   . Hypertension Mother   . CAD Father   . Colon polyps Neg Hx   . Diabetes Neg Hx   . Gallbladder disease Neg Hx   . Esophageal cancer Neg Hx    Social History:  reports that he quit smoking about 18 months ago. His smoking use included cigarettes. He has a 15.00 pack-year smoking history. He has never used smokeless tobacco. He reports that he does not drink alcohol or use drugs.  Allergies: No Known Allergies  No medications prior to admission.    No results found for this or any previous visit (from the past 48 hour(s)). No results found.  ROS: otherwise negative  There were no vitals taken for this visit.  PHYSICAL EXAM: Overall appearance:  Healthy appearing, in no distress Head:  Normocephalic, atraumatic. Ears: External auditory canals are clear; tympanic membranes are intact and the middle ears are free of any effusion. Nose: External nose is healthy in appearance. Internal nasal exam free of any lesions or obstruction. Oral Cavity/pharynx:  There are no mucosal lesions or masses  identified. Hypopharynx/Larynx: Fibrotic changes to the right side supraglottis. Neuro:  No identifiable neurologic deficits. Neck: No palpable neck masses.  Studies Reviewed: none    Assessment/Plan Proceed with total laryngectomy.  Significant supraglottic edema with  Izora Gala 07/23/2017, 9:15 AM

## 2017-08-03 ENCOUNTER — Other Ambulatory Visit (HOSPITAL_COMMUNITY): Payer: Self-pay

## 2017-08-06 ENCOUNTER — Other Ambulatory Visit (HOSPITAL_COMMUNITY): Payer: Self-pay

## 2017-08-07 NOTE — Pre-Procedure Instructions (Signed)
    CONWAY FEDORA  08/07/2017      [place], [place] [address] [place] [address] Phone: [phone] Fax: [phone]    Your procedure is scheduled on Aug 10, 2017.  Report to [facility] Admitting at 530 AM.  Call this number if you have problems the morning of surgery:  [phone]   Remember:  Do not eat food or drink liquids after midnight.  Take these medicines the morning of surgery with A SIP OF WATER oxycodone-acetaminophen (percocet)-if needed   Do not wear jewelry.  Do not wear lotions, powders, or colognes, or deodorant.  Men may shave face and neck.  Do not bring valuables to the hospital.  [facility] is not responsible for any belongings or valuables.  Contacts, dentures or bridgework may not be worn into surgery.  Leave your suitcase in the car.  After surgery it may be brought to your room.  For patients admitted to the hospital, discharge time will be determined by your treatment team.  Patients discharged the day of surgery will not be allowed to drive home.    [place]- Preparing For Surgery  Before surgery, you can play an important role. Because skin is not sterile, your skin needs to be as free of germs as possible. You can reduce the number of germs on your skin by washing with CHG (chlorahexidine gluconate) Soap before surgery.  CHG is an antiseptic cleaner which kills germs and bonds with the skin to continue killing germs even after washing.  Please do not use if you have an allergy to CHG or antibacterial soaps. If your skin becomes reddened/irritated stop using the CHG.  Do not shave (including legs and underarms) for at least 48 hours prior to first CHG shower. It is OK to shave your face.  Please follow these instructions carefully.   1. Shower the NIGHT BEFORE SURGERY and the MORNING OF SURGERY with CHG.   2. If you chose to wash your hair, wash your hair  first as usual with your normal shampoo.  3. After you shampoo, rinse your hair and body thoroughly to remove the shampoo.  4. Use CHG as you would any other liquid soap. You can apply CHG directly to the skin and wash gently with a scrungie or a clean washcloth.   5. Apply the CHG Soap to your body ONLY FROM THE NECK DOWN.  Do not use on open wounds or open sores. Avoid contact with your eyes, ears, mouth and genitals (private parts). Wash Face and genitals (private parts)  with your normal soap.  6. Wash thoroughly, paying special attention to the area where your surgery will be performed.  7. Thoroughly rinse your body with warm water from the neck down.  8. DO NOT shower/wash with your normal soap after using and rinsing off the CHG Soap.  9. Pat yourself dry with a CLEAN TOWEL.  10. Wear CLEAN PAJAMAS to bed the night before surgery, wear comfortable clothes the morning of surgery  11. Place CLEAN SHEETS on your bed the night of your first shower and DO NOT SLEEP WITH PETS.  Day of Surgery: Do not apply any deodorants/lotions. Please wear clean clothes to the hospital/surgery center.    Please read over the following fact sheets that you were given. Pain Booklet, Coughing and Deep Breathing and Surgical Site Infection Prevention

## 2017-08-07 NOTE — Progress Notes (Addendum)
PCP: Velna Ochs, MD  Cardiologist: pt denies  EKG: 4/319 in EPIC  Stress test: pt denies  ECHO: pt denies  Cardiac Cath: pt denies  Chest x-ray: pt denies past year, no recent respiratory infections/complications

## 2017-08-08 ENCOUNTER — Encounter (HOSPITAL_COMMUNITY)
Admission: RE | Admit: 2017-08-08 | Discharge: 2017-08-08 | Disposition: A | Discharge status: Home / Self Care | Payer: Medicaid Other | Source: Ambulatory Visit | Attending: Otolaryngology | Admitting: Otolaryngology

## 2017-08-08 ENCOUNTER — Other Ambulatory Visit: Payer: Self-pay

## 2017-08-08 ENCOUNTER — Encounter (HOSPITAL_COMMUNITY): Payer: Self-pay

## 2017-08-08 DIAGNOSIS — Z01812 Encounter for preprocedural laboratory examination: Secondary | ICD-10-CM | POA: Diagnosis present

## 2017-08-08 DIAGNOSIS — C329 Malignant neoplasm of larynx, unspecified: Secondary | ICD-10-CM | POA: Insufficient documentation

## 2017-08-08 LAB — BASIC METABOLIC PANEL
ANION GAP: 12 (ref 5–15)
BUN: 9 mg/dL (ref 6–20)
CO2: 26 mmol/L (ref 22–32)
CREATININE: 0.93 mg/dL (ref 0.61–1.24)
Calcium: 9.5 mg/dL (ref 8.9–10.3)
Chloride: 99 mmol/L — ABNORMAL LOW (ref 101–111)
GFR calc Af Amer: >60 mL/min (ref >60)
GLUCOSE: 83 mg/dL (ref 65–99)
Potassium: 4.1 mmol/L (ref 3.5–5.1)
Sodium: 137 mmol/L (ref 135–145)

## 2017-08-08 LAB — CBC
HCT: 42.3 % (ref 39.0–52.0)
Hemoglobin: 13.8 g/dL (ref 13.0–17.0)
MCH: 30.6 pg (ref 26.0–34.0)
MCHC: 32.6 g/dL (ref 30.0–36.0)
MCV: 93.8 fL (ref 78.0–100.0)
PLATELETS: 228 10*3/uL (ref 150–400)
RBC: 4.51 MIL/uL (ref 4.22–5.81)
RDW: 15.6 % — AB (ref 11.5–15.5)
WBC: 4.9 10*3/uL (ref 4.0–10.5)

## 2017-08-08 MED ORDER — CEFAZOLIN SODIUM-DEXTROSE 2-4 GM/100ML-% IV SOLN
INTRAVENOUS | Status: AC
  Filled 2017-08-08: qty 100

## 2017-08-09 MED ORDER — CEFAZOLIN SODIUM-DEXTROSE 2-4 GM/100ML-% IV SOLN
INTRAVENOUS | Status: AC
  Filled 2017-08-09: qty 100

## 2017-08-10 ENCOUNTER — Encounter (HOSPITAL_COMMUNITY)
Admission: RE | Disposition: A | Discharge status: Home Health | Payer: Self-pay | Source: Ambulatory Visit | Attending: Otolaryngology

## 2017-08-10 ENCOUNTER — Ambulatory Visit (HOSPITAL_COMMUNITY): Payer: Medicaid Other | Admitting: Certified Registered"

## 2017-08-10 ENCOUNTER — Encounter (HOSPITAL_COMMUNITY): Payer: Self-pay

## 2017-08-10 ENCOUNTER — Inpatient Hospital Stay (HOSPITAL_COMMUNITY)
Admission: RE | Admit: 2017-08-10 | Discharge: 2017-08-16 | DRG: 012 | Disposition: A | Discharge status: Home Health | Payer: Medicaid Other | Source: Ambulatory Visit | Attending: Otolaryngology | Admitting: Otolaryngology

## 2017-08-10 ENCOUNTER — Other Ambulatory Visit: Payer: Self-pay

## 2017-08-10 DIAGNOSIS — I10 Essential (primary) hypertension: Secondary | ICD-10-CM | POA: Diagnosis present

## 2017-08-10 DIAGNOSIS — Z8249 Family history of ischemic heart disease and other diseases of the circulatory system: Secondary | ICD-10-CM

## 2017-08-10 DIAGNOSIS — Z8042 Family history of malignant neoplasm of prostate: Secondary | ICD-10-CM

## 2017-08-10 DIAGNOSIS — Z87891 Personal history of nicotine dependence: Secondary | ICD-10-CM

## 2017-08-10 DIAGNOSIS — Z801 Family history of malignant neoplasm of trachea, bronchus and lung: Secondary | ICD-10-CM

## 2017-08-10 DIAGNOSIS — E440 Moderate protein-calorie malnutrition: Secondary | ICD-10-CM

## 2017-08-10 DIAGNOSIS — J384 Edema of larynx: Secondary | ICD-10-CM | POA: Diagnosis present

## 2017-08-10 DIAGNOSIS — Z681 Body mass index (BMI) 19 or less, adult: Secondary | ICD-10-CM

## 2017-08-10 DIAGNOSIS — E785 Hyperlipidemia, unspecified: Secondary | ICD-10-CM | POA: Diagnosis present

## 2017-08-10 DIAGNOSIS — K219 Gastro-esophageal reflux disease without esophagitis: Secondary | ICD-10-CM | POA: Diagnosis present

## 2017-08-10 DIAGNOSIS — Z808 Family history of malignant neoplasm of other organs or systems: Secondary | ICD-10-CM

## 2017-08-10 DIAGNOSIS — Z86010 Personal history of colonic polyps: Secondary | ICD-10-CM

## 2017-08-10 DIAGNOSIS — C321 Malignant neoplasm of supraglottis: Secondary | ICD-10-CM | POA: Diagnosis present

## 2017-08-10 DIAGNOSIS — Z9002 Acquired absence of larynx: Secondary | ICD-10-CM

## 2017-08-10 DIAGNOSIS — M170 Bilateral primary osteoarthritis of knee: Secondary | ICD-10-CM | POA: Diagnosis present

## 2017-08-10 DIAGNOSIS — Z800 Family history of malignant neoplasm of digestive organs: Secondary | ICD-10-CM | POA: Diagnosis not present

## 2017-08-10 DIAGNOSIS — Z430 Encounter for attention to tracheostomy: Secondary | ICD-10-CM

## 2017-08-10 DIAGNOSIS — Z923 Personal history of irradiation: Secondary | ICD-10-CM | POA: Diagnosis not present

## 2017-08-10 DIAGNOSIS — C329 Malignant neoplasm of larynx, unspecified: Secondary | ICD-10-CM | POA: Diagnosis not present

## 2017-08-10 HISTORY — PX: LARYNGETOMY: SHX5202

## 2017-08-10 LAB — GLUCOSE, CAPILLARY
GLUCOSE-CAPILLARY: 176 mg/dL — AB (ref 65–99)
GLUCOSE-CAPILLARY: 163 mg/dL — AB (ref 65–99)

## 2017-08-10 LAB — MAGNESIUM: MAGNESIUM: 1.5 mg/dL — AB (ref 1.7–2.4)

## 2017-08-10 LAB — PHOSPHORUS: Phosphorus: 3 mg/dL (ref 2.5–4.6)

## 2017-08-10 SURGERY — LARYNGECTOMY
Anesthesia: General | Site: Neck

## 2017-08-10 MED ORDER — MIDAZOLAM HCL 5 MG/5ML IJ SOLN
INTRAMUSCULAR | Status: DC | PRN
  Administered 2017-08-10: 2 mg via INTRAVENOUS

## 2017-08-10 MED ORDER — BACITRACIN ZINC 500 UNIT/GM EX OINT
TOPICAL_OINTMENT | CUTANEOUS | Status: DC | PRN
  Administered 2017-08-10: 1 via TOPICAL

## 2017-08-10 MED ORDER — PHENYLEPHRINE HCL 10 MG/ML IJ SOLN
INTRAVENOUS | Status: DC | PRN
  Administered 2017-08-10: 30 ug/min via INTRAVENOUS

## 2017-08-10 MED ORDER — SUGAMMADEX SODIUM 200 MG/2ML IV SOLN
INTRAVENOUS | Status: AC
  Filled 2017-08-10: qty 2

## 2017-08-10 MED ORDER — LACTATED RINGERS IV SOLN
INTRAVENOUS | Status: DC | PRN
  Administered 2017-08-10 (×2): via INTRAVENOUS

## 2017-08-10 MED ORDER — ONDANSETRON HCL 4 MG/2ML IJ SOLN
INTRAMUSCULAR | Status: AC
  Filled 2017-08-10: qty 2

## 2017-08-10 MED ORDER — ROCURONIUM BROMIDE 50 MG/5ML IV SOLN
INTRAVENOUS | Status: AC
Start: 2017-08-10 — End: ?
  Filled 2017-08-10: qty 1

## 2017-08-10 MED ORDER — LIDOCAINE 2% (20 MG/ML) 5 ML SYRINGE
INTRAMUSCULAR | Status: AC
  Filled 2017-08-10: qty 5

## 2017-08-10 MED ORDER — CLINDAMYCIN PHOSPHATE 900 MG/50ML IV SOLN
INTRAVENOUS | Status: AC
  Filled 2017-08-10: qty 50

## 2017-08-10 MED ORDER — FENTANYL CITRATE (PF) 100 MCG/2ML IJ SOLN
INTRAMUSCULAR | Status: AC
  Filled 2017-08-10: qty 2

## 2017-08-10 MED ORDER — LABETALOL HCL 5 MG/ML IV SOLN
INTRAVENOUS | Status: AC
  Filled 2017-08-10: qty 4

## 2017-08-10 MED ORDER — PROPOFOL 10 MG/ML IV BOLUS
INTRAVENOUS | Status: AC
  Filled 2017-08-10: qty 20

## 2017-08-10 MED ORDER — METOPROLOL TARTRATE 5 MG/5ML IV SOLN
5.0000 mg | Freq: Three times a day (TID) | INTRAVENOUS | Status: DC
  Administered 2017-08-10 – 2017-08-11 (×3): 5 mg via INTRAVENOUS
  Filled 2017-08-10 (×3): qty 5

## 2017-08-10 MED ORDER — LIDOCAINE 2% (20 MG/ML) 5 ML SYRINGE
INTRAMUSCULAR | Status: AC
Start: 2017-08-10 — End: ?
  Filled 2017-08-10: qty 5

## 2017-08-10 MED ORDER — POTASSIUM CHLORIDE IN NACL 20-0.45 MEQ/L-% IV SOLN
INTRAVENOUS | Status: DC
  Administered 2017-08-10 – 2017-08-11 (×2): via INTRAVENOUS
  Filled 2017-08-10 (×3): qty 1000

## 2017-08-10 MED ORDER — SUCCINYLCHOLINE CHLORIDE 200 MG/10ML IV SOSY
PREFILLED_SYRINGE | INTRAVENOUS | Status: AC
  Filled 2017-08-10: qty 10

## 2017-08-10 MED ORDER — PROPOFOL 10 MG/ML IV BOLUS
INTRAVENOUS | Status: DC | PRN
  Administered 2017-08-10: 200 mg via INTRAVENOUS

## 2017-08-10 MED ORDER — VITAL HIGH PROTEIN PO LIQD: 1000.0000 mL | ORAL | Status: DC

## 2017-08-10 MED ORDER — CLINDAMYCIN PHOSPHATE 600 MG/50ML IV SOLN
600.0000 mg | Freq: Three times a day (TID) | INTRAVENOUS | Status: DC
  Administered 2017-08-10 – 2017-08-13 (×9): 600 mg via INTRAVENOUS
  Filled 2017-08-10 (×9): qty 50

## 2017-08-10 MED ORDER — OXYCODONE HCL 5 MG/5ML PO SOLN: 5.0000 mg | Freq: Once | ORAL | Status: DC | PRN

## 2017-08-10 MED ORDER — OSMOLITE 1.2 CAL PO LIQD
1000.0000 mL | ORAL | Status: AC
  Administered 2017-08-10 – 2017-08-14 (×5): 1000 mL
  Filled 2017-08-10 (×10): qty 1000

## 2017-08-10 MED ORDER — FENTANYL CITRATE (PF) 100 MCG/2ML IJ SOLN
25.0000 ug | INTRAMUSCULAR | Status: DC | PRN
  Administered 2017-08-10: 25 ug via INTRAVENOUS

## 2017-08-10 MED ORDER — BACITRACIN ZINC 500 UNIT/GM EX OINT
TOPICAL_OINTMENT | CUTANEOUS | Status: AC
  Filled 2017-08-10: qty 28.35

## 2017-08-10 MED ORDER — INSULIN ASPART 100 UNIT/ML ~~LOC~~ SOLN
0.0000 [IU] | Freq: Three times a day (TID) | SUBCUTANEOUS | Status: DC
  Administered 2017-08-11 (×2): 3 [IU] via SUBCUTANEOUS
  Administered 2017-08-11: 2 [IU] via SUBCUTANEOUS
  Administered 2017-08-12: 3 [IU] via SUBCUTANEOUS
  Administered 2017-08-12 – 2017-08-14 (×3): 2 [IU] via SUBCUTANEOUS
  Administered 2017-08-15: 3 [IU] via SUBCUTANEOUS
  Administered 2017-08-15 – 2017-08-16 (×2): 2 [IU] via SUBCUTANEOUS

## 2017-08-10 MED ORDER — DEXTROSE-NACL 5-0.9 % IV SOLN
INTRAVENOUS | Status: DC
  Administered 2017-08-10: 14:00:00 via INTRAVENOUS

## 2017-08-10 MED ORDER — BACITRACIN ZINC 500 UNIT/GM EX OINT
1.0000 "application " | TOPICAL_OINTMENT | Freq: Three times a day (TID) | CUTANEOUS | Status: DC
  Administered 2017-08-10 – 2017-08-16 (×19): 1 via TOPICAL
  Filled 2017-08-10: qty 28.35

## 2017-08-10 MED ORDER — LABETALOL HCL 5 MG/ML IV SOLN
20.0000 mg | INTRAVENOUS | Status: DC | PRN
  Administered 2017-08-10: 20 mg via INTRAVENOUS

## 2017-08-10 MED ORDER — IBUPROFEN 100 MG/5ML PO SUSP
400.0000 mg | Freq: Four times a day (QID) | ORAL | Status: DC | PRN
  Administered 2017-08-10 – 2017-08-13 (×3): 400 mg
  Filled 2017-08-10 (×4): qty 20

## 2017-08-10 MED ORDER — PROMETHAZINE HCL 25 MG RE SUPP
25.0000 mg | Freq: Four times a day (QID) | RECTAL | Status: DC | PRN
  Filled 2017-08-10: qty 1

## 2017-08-10 MED ORDER — MORPHINE SULFATE (PF) 2 MG/ML IV SOLN
2.0000 mg | INTRAVENOUS | Status: DC | PRN
  Administered 2017-08-10 – 2017-08-12 (×8): 2 mg via INTRAVENOUS
  Filled 2017-08-10 (×8): qty 1

## 2017-08-10 MED ORDER — INSULIN ASPART 100 UNIT/ML ~~LOC~~ SOLN: 0.0000 [IU] | Freq: Every day | SUBCUTANEOUS | Status: DC

## 2017-08-10 MED ORDER — ROCURONIUM BROMIDE 100 MG/10ML IV SOLN
INTRAVENOUS | Status: DC | PRN
  Administered 2017-08-10: 50 mg via INTRAVENOUS

## 2017-08-10 MED ORDER — DEXAMETHASONE SODIUM PHOSPHATE 10 MG/ML IJ SOLN
INTRAMUSCULAR | Status: AC
  Filled 2017-08-10: qty 1

## 2017-08-10 MED ORDER — PHENYLEPHRINE HCL 10 MG/ML IJ SOLN
INTRAMUSCULAR | Status: DC | PRN
  Administered 2017-08-10 (×3): 200 ug via INTRAVENOUS
  Administered 2017-08-10: 120 ug via INTRAVENOUS

## 2017-08-10 MED ORDER — HYDRALAZINE HCL 20 MG/ML IJ SOLN
INTRAMUSCULAR | Status: AC
  Filled 2017-08-10: qty 1

## 2017-08-10 MED ORDER — ONDANSETRON HCL 4 MG/2ML IJ SOLN
INTRAMUSCULAR | Status: DC | PRN
  Administered 2017-08-10: 4 mg via INTRAVENOUS

## 2017-08-10 MED ORDER — DEXAMETHASONE SODIUM PHOSPHATE 10 MG/ML IJ SOLN
INTRAMUSCULAR | Status: DC | PRN
  Administered 2017-08-10: 10 mg via INTRAVENOUS

## 2017-08-10 MED ORDER — MIDAZOLAM HCL 2 MG/2ML IJ SOLN
INTRAMUSCULAR | Status: AC
  Filled 2017-08-10: qty 2

## 2017-08-10 MED ORDER — FENTANYL CITRATE (PF) 100 MCG/2ML IJ SOLN
INTRAMUSCULAR | Status: DC | PRN
  Administered 2017-08-10: 100 ug via INTRAVENOUS
  Administered 2017-08-10: 50 ug via INTRAVENOUS
  Administered 2017-08-10: 150 ug via INTRAVENOUS
  Administered 2017-08-10: 100 ug via INTRAVENOUS
  Administered 2017-08-10: 50 ug via INTRAVENOUS
  Administered 2017-08-10: 100 ug via INTRAVENOUS
  Administered 2017-08-10: 50 ug via INTRAVENOUS

## 2017-08-10 MED ORDER — FENTANYL CITRATE (PF) 250 MCG/5ML IJ SOLN
INTRAMUSCULAR | Status: AC
  Filled 2017-08-10: qty 5

## 2017-08-10 MED ORDER — SUGAMMADEX SODIUM 200 MG/2ML IV SOLN
INTRAVENOUS | Status: DC | PRN
  Administered 2017-08-10: 110 mg via INTRAVENOUS

## 2017-08-10 MED ORDER — HYDRALAZINE HCL 20 MG/ML IJ SOLN
10.0000 mg | Freq: Once | INTRAMUSCULAR | Status: AC
  Administered 2017-08-10: 10 mg via INTRAVENOUS

## 2017-08-10 MED ORDER — LABETALOL HCL 5 MG/ML IV SOLN
10.0000 mg | INTRAVENOUS | Status: AC | PRN
  Administered 2017-08-10 (×2): 10 mg via INTRAVENOUS

## 2017-08-10 MED ORDER — 0.9 % SODIUM CHLORIDE (POUR BTL) OPTIME
TOPICAL | Status: DC | PRN
Start: 2017-08-10 — End: 2017-08-10
  Administered 2017-08-10: 1000 mL

## 2017-08-10 MED ORDER — OXYCODONE HCL 5 MG PO TABS: 5.0000 mg | ORAL_TABLET | Freq: Once | ORAL | Status: DC | PRN

## 2017-08-10 MED ORDER — LABETALOL HCL 5 MG/ML IV SOLN: 10.0000 mg | INTRAVENOUS | Status: DC | PRN

## 2017-08-10 MED ORDER — LIDOCAINE HCL (CARDIAC) PF 100 MG/5ML IV SOSY
PREFILLED_SYRINGE | INTRAVENOUS | Status: DC | PRN
  Administered 2017-08-10: 100 mg via INTRAVENOUS

## 2017-08-10 MED ORDER — CLINDAMYCIN PHOSPHATE 900 MG/50ML IV SOLN
900.0000 mg | Freq: Once | INTRAVENOUS | Status: AC
  Administered 2017-08-10: 900 mg via INTRAVENOUS

## 2017-08-10 MED ORDER — HYDROCODONE-ACETAMINOPHEN 7.5-325 MG/15ML PO SOLN
10.0000 mL | ORAL | Status: DC | PRN
  Administered 2017-08-10 – 2017-08-16 (×25): 15 mL
  Filled 2017-08-10 (×25): qty 15

## 2017-08-10 SURGICAL SUPPLY — 44 items
BLADE SURG 15 STRL LF DISP TIS (BLADE) ×1 IMPLANT
BLADE SURG 15 STRL SS (BLADE) ×2
CLEANER TIP ELECTROSURG 2X2 (MISCELLANEOUS) ×3 IMPLANT
CONT SPEC 4OZ CLIKSEAL STRL BL (MISCELLANEOUS) IMPLANT
CORD BIPOLAR FORCEPS 12FT (ELECTRODE) ×3 IMPLANT
COVER SURGICAL LIGHT HANDLE (MISCELLANEOUS) ×3 IMPLANT
DRAIN SNY 10 ROU (WOUND CARE) ×6 IMPLANT
DRAPE HALF SHEET 40X57 (DRAPES) ×3 IMPLANT
ELECT COATED BLADE 2.86 ST (ELECTRODE) ×3 IMPLANT
ELECT REM PT RETURN 9FT ADLT (ELECTROSURGICAL) ×3
ELECTRODE REM PT RTRN 9FT ADLT (ELECTROSURGICAL) ×1 IMPLANT
EVACUATOR SILICONE 100CC (DRAIN) ×6 IMPLANT
FORCEPS BIPOLAR SPETZLER 8 1.0 (NEUROSURGERY SUPPLIES) ×3 IMPLANT
GAUZE SPONGE 4X4 16PLY XRAY LF (GAUZE/BANDAGES/DRESSINGS) ×9 IMPLANT
GLOVE BIO SURGEON STRL SZ 6.5 (GLOVE) ×4 IMPLANT
GLOVE BIO SURGEONS STRL SZ 6.5 (GLOVE) ×2
GLOVE ECLIPSE 7.5 STRL STRAW (GLOVE) ×3 IMPLANT
GOWN STRL REUS W/ TWL LRG LVL3 (GOWN DISPOSABLE) ×3 IMPLANT
GOWN STRL REUS W/TWL LRG LVL3 (GOWN DISPOSABLE) ×6
KIT BASIN OR (CUSTOM PROCEDURE TRAY) ×3 IMPLANT
KIT TURNOVER KIT B (KITS) ×3 IMPLANT
NEEDLE PRECISIONGLIDE 27X1.5 (NEEDLE) IMPLANT
NS IRRIG 1000ML POUR BTL (IV SOLUTION) ×3 IMPLANT
PAD ARMBOARD 7.5X6 YLW CONV (MISCELLANEOUS) ×6 IMPLANT
PENCIL FOOT CONTROL (ELECTRODE) ×3 IMPLANT
SPECIMEN JAR MEDIUM (MISCELLANEOUS) IMPLANT
SPONGE LAP 18X18 X RAY DECT (DISPOSABLE) ×3 IMPLANT
STAPLER VISISTAT 35W (STAPLE) ×3 IMPLANT
SUT CHROMIC 3 0 SH 27 (SUTURE) ×21 IMPLANT
SUT CHROMIC 4 0 PS 2 18 (SUTURE) IMPLANT
SUT ETHILON 3 0 PS 1 (SUTURE) IMPLANT
SUT SILK 2 0 REEL (SUTURE) ×3 IMPLANT
SUT SILK 2 0 SH CR/8 (SUTURE) ×3 IMPLANT
SUT SILK 3 0 REEL (SUTURE) ×3 IMPLANT
SUT SILK 4 0 REEL (SUTURE) ×3 IMPLANT
SUT VIC AB 3-0 FS2 27 (SUTURE) ×9 IMPLANT
SYR BULB IRRIGATION 50ML (SYRINGE) IMPLANT
TOWEL NATURAL 6PK STERILE (DISPOSABLE) ×3 IMPLANT
TRAY ENT MC OR (CUSTOM PROCEDURE TRAY) ×3 IMPLANT
TRAY FOLEY MTR SLVR 14FR STAT (SET/KITS/TRAYS/PACK) ×3 IMPLANT
TUBE FEEDING 10FR FLEXIFLO (MISCELLANEOUS) ×3 IMPLANT
TUBE TRACH SHILEY  6 DIST  CUF (TUBING) IMPLANT
TUBE TRACH SHILEY 8 DIST CUF (TUBING) IMPLANT
WATER STERILE IRR 1000ML POUR (IV SOLUTION) IMPLANT

## 2017-08-10 NOTE — Interval H&P Note (Signed)
History and Physical Interval Note:  08/10/2017 7:26 AM  Jonathan [name]  has presented today for surgery, with the diagnosis of Laryngeal Cancer  The various methods of treatment have been discussed with the patient and family. After consideration of risks, benefits and other options for treatment, the patient has consented to  Procedure(s): LARYNGECTOMY (N/A) as a surgical intervention .  The patient's history has been reviewed, patient examined, no change in status, stable for surgery.  I have reviewed the patient's chart and labs.  Questions were answered to the patient's satisfaction.     Izora Gala

## 2017-08-10 NOTE — Anesthesia Postprocedure Evaluation (Signed)
Anesthesia Post Note  Patient: Jonathan [name]  Procedure(s) Performed: TOTAL LARYNGECTOMY (N/A Neck)     Patient location during evaluation: PACU Anesthesia Type: General Level of consciousness: awake and patient cooperative Pain management: pain level controlled Vital Signs Assessment: post-procedure vital signs reviewed and stable Respiratory status: spontaneous breathing, nonlabored ventilation, respiratory function stable and patient connected to tracheostomy mask oxygen Cardiovascular status: blood pressure returned to baseline and stable Postop Assessment: no apparent nausea or vomiting Anesthetic complications: no    Last Vitals:  Vitals:   08/10/17 1314 08/10/17 1335  BP:  (!) 126/96  Pulse:  88  Resp:  15  Temp: 37.6 C   SpO2:  100%    Last Pain:  Vitals:   08/10/17 1314  TempSrc:   PainSc: 2                  Edd Reppert

## 2017-08-10 NOTE — Transfer of Care (Signed)
Immediate Anesthesia Transfer of Care Note  Patient: Jonathan [name]  Procedure(s) Performed: TOTAL LARYNGECTOMY (N/A Neck)  Patient Location: PACU  Anesthesia Type:General  Level of Consciousness: awake and patient cooperative  Airway & Oxygen Therapy: Patient Spontanous Breathing  Post-op Assessment: Report given to RN and Post -op Vital signs reviewed and stable  Post vital signs: Reviewed and stable  Last Vitals:  Vitals Value Taken Time  BP 192/119 08/10/2017 10:46 AM  Temp    Pulse 113 08/10/2017 10:48 AM  Resp 14 08/10/2017 10:48 AM  SpO2 92 % 08/10/2017 10:48 AM  Vitals shown include unvalidated device data.  Last Pain:  Vitals:   08/10/17 0624  TempSrc:   PainSc: 4       Patients Stated Pain Goal: 3 (28/76/81 1572)  Complications: No apparent anesthesia complications

## 2017-08-10 NOTE — Anesthesia Procedure Notes (Signed)
Procedure Name: Intubation Date/Time: 08/10/2017 7:52 AM Performed by: Lance Coon, CRNA Pre-anesthesia Checklist: Patient identified, Emergency Drugs available, Suction available, Patient being monitored and Timeout performed Patient Re-evaluated:Patient Re-evaluated prior to induction Oxygen Delivery Method: Circle system utilized Preoxygenation: Pre-oxygenation with 100% oxygen Induction Type: IV induction Ventilation: Mask ventilation without difficulty Laryngoscope Size: Miller and 3 Grade View: Grade I Tube type: Oral Tube size: 6.5 mm Number of attempts: 1 Airway Equipment and Method: Stylet Placement Confirmation: ETT inserted through vocal cords under direct vision,  positive ETCO2 and breath sounds checked- equal and bilateral Secured at: 23 cm Tube secured with: Tape Dental Injury: Teeth and Oropharynx as per pre-operative assessment

## 2017-08-10 NOTE — Progress Notes (Signed)
Care of pt assumed by [facility]. Dr Ermalene Postin at bedside and aware of BP 192/ 115-119.  New orders for IV Labetelol to keep BP < 180/100. Will continue to monitor closely.

## 2017-08-10 NOTE — Progress Notes (Signed)
Feeding tube intact to left nare, clamped, no Xray needed per Dr Constance Holster.

## 2017-08-10 NOTE — Progress Notes (Signed)
Initial Nutrition Assessment  DOCUMENTATION CODES:   Non-severe (moderate) malnutrition in context of chronic illness  INTERVENTION:   Tube Feeding:  Osmolite 1.2 @ 65 ml/hr Provides 1872 kcals, 87 g of protein and 64 mL of free water  NUTRITION DIAGNOSIS:   Moderate Malnutrition related to chronic illness(cancer) as evidenced by mild fat depletion, mild muscle depletion.  GOAL:   Patient will meet greater than or equal to 90% of their needs  MONITOR:   TF tolerance, Labs, Weight trends  REASON FOR ASSESSMENT:   Consult Enteral/tube feeding initiation and management  ASSESSMENT:   57 yo male admitted with laryngeal cancer with total laryngectomy on 5/10. 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 Pt with additional hx of GERD, HLD   5/10 Total Laryngectomy  Obtained limited information as pt unable to vocalize at present time.  Pt does indicate that PTA pt eating 3 meals per day and drinking 3 Ensure  Pt with trach, currently on trach collar 10 french NG tube in place  Pt reports 4 pound weight loss in 2 weeks; 3.3% wt loss in 2 weeks Current wt 116 pounds  Pt as been followed for a while by an RD at the [facility] (05/2016 to 12/2016). Pt has not been seen by RD this year. Pt has experienced gradual weight loss since cancer dx and  initiation of cancer treatment. Pt UBW prior to diagnosis was 140-145 pounds  Labs: reviewed Meds: D5-NS at 75 ml/hr  NUTRITION - FOCUSED PHYSICAL EXAM:    Most Recent Value  Orbital Region  Mild depletion  Upper Arm Region  Mild depletion  Thoracic and Lumbar Region  No depletion  Buccal Region  Mild depletion  Temple Region  Moderate depletion  Clavicle Bone Region  Mild depletion  Clavicle and Acromion Bone Region  Mild depletion  Scapular Bone Region  Mild depletion  Dorsal Hand  Mild depletion  Patellar Region  Mild depletion  Anterior Thigh Region  Mild depletion  Posterior Calf Region   No depletion  Edema (RD Assessment)  None       Diet Order:   Diet Order           Diet NPO time specified  Diet effective now          EDUCATION NEEDS:   Education needs have been addressed  Skin:  Skin Assessment: Reviewed RN Assessment  Last BM:  no documented BM  Height:   Ht Readings from Last 1 Encounters:  08/10/17 5\' 5"  (1.651 m)    Weight:   Wt Readings from Last 1 Encounters:  08/10/17 116 lb (52.6 kg)    Ideal Body Weight:     BMI:  Body mass index is 19.3 kg/m.  Estimated Nutritional Needs:   Kcal:  1800-2000 kcals   Protein:  85-100 g  Fluid:  >/= 1.8 L   Kerman Passey MS, RD, LDN, CNSC [phone] Pager  [phone] Weekend/On-Call Pager

## 2017-08-10 NOTE — Progress Notes (Signed)
   Subjective:    Patient ID: Jonathan [name], male    DOB: 1960-05-22, 57 y.o.   MRN: [id number]  HPI Doing well with no complaints.  Review of Systems     Objective:   Physical Exam AF VSS except SBP in 150s Alert, NAD Stoma patent, neck flap healthy, no fluid collection, incisions clean and intact, drains functioning     Assessment & Plan:  S/p total laryngectomy  Looks good.  Will change fluids to 1/2 NS with KCl and start sliding scale insulin.  Lopressor prn for hypertension.  Advancing tube feeds slowly.

## 2017-08-10 NOTE — Op Note (Signed)
OPERATIVE REPORT  DATE OF SURGERY: 08/10/2017  PATIENT:  Jonathan [name],  57 y.o. male  PRE-OPERATIVE DIAGNOSIS:  Laryngeal Cancer  POST-OPERATIVE DIAGNOSIS:  Laryngeal Cancer  PROCEDURE:  Procedure(s): TOTAL LARYNGECTOMY  SURGEON:  Beckie Salts, MD  ASSISTANTS: Jolene Provost PA  ANESTHESIA:   General   EBL: 50 ml  DRAINS: 10 [place] round JP x2  LOCAL MEDICATIONS USED:  None  SPECIMEN: Total laryngectomy, right parapharyngeal tissue for frozen section negative for carcinoma.  COUNTS:  Correct  PROCEDURE DETAILS: The patient was taken to the operating room and placed on the operating table in the supine position. Following induction of general endotracheal anesthesia, the neck was prepped and draped in the standard fashion.  A transverse incision was outlined with a marking pen at about the level of the mid thyroid cartilage.  A separate stomal incision was outlined as well keeping about 3 cm in between the 2.  Electrocautery was used to incise the skin and subcutaneous tissue.  Subplatysmal flap was elevated superiorly to the suprahyoid area and inferiorly to the clavicle.  The sternocleidomastoid muscles were exposed.  Soft tissue was dissected off the sternocleidal mastoid muscles on both sides and brought towards the midline.  The original tumors on the right.  The left thyroid lobe was preserved.  The left thyroid lobe was reflected laterally and preserved with its superior blood supply.  The right thyroid thyroid lobe was dissected off the sternocleidomastoid muscle and taken with the strap muscles medially towards the specimen.  The strap muscles were all transected at their attachment at the clavicle exposing the trachea.  The region tracheostomy site was high, approximately the first ring and there was adequate trachea to create the stoma between the second and third brain.  The suprahyoid muscles were transected off of the hyoid skeletonizing the hyoid and exposing the  upper pharyngeal mucosa.  The constrictor muscles were dissected off of the thyroid lamina on both sides and reflected posteriorly for later closure.  The entire larynx was skeletonized.  The tracheal stoma was created and a reinforced 7 endotracheal tube was then placed into the new stoma.  Several sutures were used to secure the lateral and inferior aspects of the stoma to the skin until the formal stomal maturation was performed later in the case.  The dissection was then continued up superiorly towards the epiglottis.  Epiglottis was exposed through the mucosa and grasped inferiorly while the pharyngotomy was created.  Using direct visualization the pharyngeal cuts were all taken.  There is no visible tumor in the piriformis or the vallecula.  The entire tumor was contained within the endolarynx.  The postcricoid mucosal cut was then created and the party wall was divided down towards the tracheal stoma site.  The entire specimen was delivered.  On a separate back table the larynx was opened posteriorly to inspect and all margins appeared clear.  An additional frozen section margin was taken from the right parapharyngeal space and this was negative for tumor.  The stoma was matured using interrupted 3-0 Vicryl vertical mattress sutures.  A nasogastric feeding tube was placed and secured in place.  The pharynx was then closed in a T fashion using 3 separate 3-0 chromic sutures inverting the mucosa carefully.  Watertight closure was achieved as saline was pumped into the pharynx and there is no leak.  A cricopharyngeal myotomy was performed prior to the closure.  The constrictor muscles were then reapproximated overlying the pharyngeal closure using interrupted  3-0 chromic suture.  The wound was irrigated with saline.  Hemostasis was completed as needed.  The 2 drains were exited through separate stab incisions each on the one on each side inferiorly to the incision.  The drains were secured in place with silk  suture.  The platysmal layer was reapproximated with interrupted 3-0 chromic and skin staples were used on the skin.  Bacitracin was applied.  The drains were charged and were holding it with suction.  Patient was awakened extubated and transferred to ICU in stable condition.    PATIENT DISPOSITION:  To PACU, stable

## 2017-08-10 NOTE — Anesthesia Preprocedure Evaluation (Signed)
Anesthesia Evaluation  Patient identified by MRN, date of birth, ID band Patient awake    Reviewed: Allergy & Precautions, NPO status , Patient's Chart, lab work & pertinent test results  History of Anesthesia Complications Negative for: history of anesthetic complications  Airway Mallampati: III  TM Distance: >3 FB Neck ROM: Full    Dental  (+) Edentulous Upper, Edentulous Lower   Pulmonary former smoker,  Laryngeal cancer: trach   breath sounds clear to auscultation       Cardiovascular negative cardio ROS   Rhythm:Regular Rate:Normal     Neuro/Psych  Headaches,    GI/Hepatic Neg liver ROS, GERD  Controlled,  Endo/Other  negative endocrine ROS  Renal/GU negative Renal ROS     Musculoskeletal  (+) Arthritis , Osteoarthritis,    Abdominal   Peds  Hematology negative hematology ROS (+)   Anesthesia Other Findings   Reproductive/Obstetrics                             Anesthesia Physical Anesthesia Plan  ASA: III  Anesthesia Plan: General   Post-op Pain Management:    Induction: Intravenous  PONV Risk Score and Plan: 2 and Ondansetron and Dexamethasone  Airway Management Planned: Video Laryngoscope Planned  Additional Equipment: None  Intra-op Plan:   Post-operative Plan: Extubation in OR  Informed Consent: I have reviewed the patients History and Physical, chart, labs and discussed the procedure including the risks, benefits and alternatives for the proposed anesthesia with the patient or authorized representative who has indicated his/her understanding and acceptance.   Dental advisory given  Plan Discussed with: CRNA and Surgeon  Anesthesia Plan Comments: (Will have trach post op with plan to transition to trach collar )        Anesthesia Quick Evaluation

## 2017-08-11 ENCOUNTER — Encounter (HOSPITAL_COMMUNITY): Payer: Self-pay | Admitting: Otolaryngology

## 2017-08-11 ENCOUNTER — Other Ambulatory Visit: Payer: Self-pay

## 2017-08-11 LAB — CBC
HEMATOCRIT: 40.8 % (ref 39.0–52.0)
HEMOGLOBIN: 13.3 g/dL (ref 13.0–17.0)
MCH: 30.3 pg (ref 26.0–34.0)
MCHC: 32.6 g/dL (ref 30.0–36.0)
MCV: 92.9 fL (ref 78.0–100.0)
Platelets: 222 10*3/uL (ref 150–400)
RBC: 4.39 MIL/uL (ref 4.22–5.81)
RDW: 16 % — ABNORMAL HIGH (ref 11.5–15.5)
WBC: 12.2 10*3/uL — AB (ref 4.0–10.5)

## 2017-08-11 LAB — BASIC METABOLIC PANEL
ANION GAP: 10 (ref 5–15)
BUN: 8 mg/dL (ref 6–20)
CHLORIDE: 95 mmol/L — AB (ref 101–111)
CO2: 31 mmol/L (ref 22–32)
Calcium: 9 mg/dL (ref 8.9–10.3)
Creatinine, Ser: 0.97 mg/dL (ref 0.61–1.24)
GFR calc Af Amer: >60 mL/min (ref >60)
GFR calc non Af Amer: >60 mL/min (ref >60)
GLUCOSE: 122 mg/dL — AB (ref 65–99)
POTASSIUM: 4 mmol/L (ref 3.5–5.1)
Sodium: 136 mmol/L (ref 135–145)

## 2017-08-11 LAB — PHOSPHORUS
Phosphorus: 2.8 mg/dL (ref 2.5–4.6)
Phosphorus: 3.2 mg/dL (ref 2.5–4.6)

## 2017-08-11 LAB — MAGNESIUM
MAGNESIUM: 1.6 mg/dL — AB (ref 1.7–2.4)
Magnesium: 1.6 mg/dL — ABNORMAL LOW (ref 1.7–2.4)

## 2017-08-11 LAB — GLUCOSE, CAPILLARY
Glucose-Capillary: 175 mg/dL — ABNORMAL HIGH (ref 65–99)
Glucose-Capillary: 138 mg/dL — ABNORMAL HIGH (ref 65–99)
Glucose-Capillary: 149 mg/dL — ABNORMAL HIGH (ref 65–99)

## 2017-08-11 MED ORDER — METOPROLOL TARTRATE 5 MG/5ML IV SOLN
5.0000 mg | Freq: Three times a day (TID) | INTRAVENOUS | Status: DC | PRN
  Filled 2017-08-11: qty 5

## 2017-08-11 NOTE — Progress Notes (Signed)
   Subjective:    Patient ID: Jonathan [name], male    DOB: February 15, 1961, 57 y.o.   MRN: [id number]  HPI Doing well.  Pain controlled.  No complaints.  Review of Systems     Objective:   Physical Exam AF VSS.  Drains 20 cc Alert, NAD Stoma widely patent, some crusting removed.  Neck incision clean and intact, flap healthy, no fluid collection, drains functioning.    Assessment & Plan:  S/p total laryngectomy  Doing well.  Continuing to advance tube feeds, tolerating well.  Will observe in ICU another 24 hours.

## 2017-08-12 LAB — TSH: TSH: 1.782 u[IU]/mL (ref 0.350–4.500)

## 2017-08-12 LAB — GLUCOSE, CAPILLARY
GLUCOSE-CAPILLARY: 121 mg/dL — AB (ref 65–99)
Glucose-Capillary: 115 mg/dL — ABNORMAL HIGH (ref 65–99)
Glucose-Capillary: 161 mg/dL — ABNORMAL HIGH (ref 65–99)
Glucose-Capillary: 140 mg/dL — ABNORMAL HIGH (ref 65–99)
Glucose-Capillary: 123 mg/dL — ABNORMAL HIGH (ref 65–99)

## 2017-08-12 MED ORDER — CHLORHEXIDINE GLUCONATE 0.12 % MT SOLN
15.0000 mL | Freq: Two times a day (BID) | OROMUCOSAL | Status: DC
  Administered 2017-08-12 – 2017-08-16 (×9): 15 mL via OROMUCOSAL
  Filled 2017-08-12 (×9): qty 15

## 2017-08-12 MED ORDER — ORAL CARE MOUTH RINSE
15.0000 mL | Freq: Two times a day (BID) | OROMUCOSAL | Status: DC
Start: 2017-08-12 — End: 2017-08-16
  Administered 2017-08-12 – 2017-08-16 (×8): 15 mL via OROMUCOSAL

## 2017-08-12 NOTE — Progress Notes (Signed)
   Subjective:    Patient ID: Jonathan [name], male    DOB: 1960/11/01, 57 y.o.   MRN: [id number]  HPI Feeling good.  No complaints.  Review of Systems     Objective:   Physical Exam AF VSS  Drains 12 cc Alert, NAD Stoma with mild crusting removed, skin incisions clean and intact, no fluid collection, drains functioning.     Assessment & Plan:  S/p total laryngectomy  Will transfer to step-down unit.  Drains may be able to be removed tomorrow.  Continue tube feeds.

## 2017-08-12 NOTE — Evaluation (Signed)
Speech Language Pathology Evaluation Patient Details Name: Jonathan [name] MRN: [id number] DOB: [birth year] Today's Date: 08/12/2017 Time: 6160-7371 SLP Time Calculation (min) (ACUTE ONLY): 30 min  Problem List:  Patient Active Problem List   Diagnosis Date Noted  . S/P laryngectomy 08/10/2017  . Malnutrition of moderate degree 08/10/2017  . Cerumen impaction 07/10/2017  . 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 Elevated BP without diagnosis of hypertension 05/21/2017  . Nasal drainage 05/21/2017  . Preventive measure 04/13/2017  . Pancytopenia, acquired ([place]) 11/21/2016  . Anemia due to antineoplastic chemotherapy 06/27/2016  . Exostoses, multiple 05/15/2016  . 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 Retained dental root 05/15/2016  . Malignant neoplasm of supraglottis ([place]) 05/10/2016  . Hoarseness 11/21/2015  . Tooth decay 06/03/2015  . History of colonic polyps 03/08/2015   Past Medical History:  Past Medical History:  Diagnosis Date  . Allergic rhinitis   . Arthritis    Bilateral knee arthritis for about 20 years. Not taking any medications, believes in nature processes  . Cancer (HCC)    larynx  . Colon polyp   . Dental caries    prechemoradiation therapy, periodontitis  . GERD (gastroesophageal reflux disease)   . Headache   . History of radiation therapy 09/11/16- 10/30/16   Larynx 70 Gy in 35 fractions  . HLD (hyperlipidemia)   . Pneumonia    as a child   Past Surgical History:  Past Surgical History:  Procedure Laterality Date  . COLONOSCOPY  03/18/2012  . DIRECT LARYNGOSCOPY N/A 07/06/2017   Procedure: DIRECT LARYNGOSCOPY;  Surgeon: Jonathan Gala, MD;  Location: [facility];  Service: ENT;  Laterality: N/A;  . ESOPHAGOSCOPY  04/24/2016   Procedure: ESOPHAGOSCOPY;  Surgeon: Jonathan Gala, MD;  Location: [facility];  Service: ENT;;  . IR GENERIC HISTORICAL  06/02/2016   IR US GUIDE VASC ACCESS RIGHT 06/02/2016 Jonathan Mariscal, MD [facility]-INTERV RAD  . IR GENERIC HISTORICAL   06/02/2016   IR FLUORO GUIDE PORT INSERTION RIGHT 06/02/2016 Jonathan Mariscal, MD [facility]  . LARYNGETOMY N/A 08/10/2017   Procedure: TOTAL LARYNGECTOMY;  Surgeon: Jonathan Gala, MD;  Location: [facility];  Service: ENT;  Laterality: N/A;  . LARYNGOSCOPY  04/24/2016   Procedure: LARYNGOSCOPY;  Surgeon: Jonathan Gala, MD;  Location: [place];  Service: ENT;;  . MULTIPLE EXTRACTIONS WITH ALVEOLOPLASTY N/A 05/26/2016   Procedure: Extraction of tooth #'s 2,5,6,8-13, 21-28 with alveoloplasty, bilateral mandibular tori reductions and buccal exostoses reductions of maxillary left, maxillary right, and mandibular left quadrants.;  Surgeon: Jonathan [name], DDS;  Location: [facility];  Service: Oral Surgery;  Laterality: N/A;  . TRACHEOSTOMY TUBE PLACEMENT N/A 04/24/2016   Procedure: TRACHEOSTOMY;  Surgeon: Jonathan Gala, MD;  Location: [place];  Service: ENT;  Laterality: N/A;   HPI:  57 y.o. male admitted for scheduled total laryngectomy.  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 OP SLP during treatment with focus on exercise program to minimize post-radiation effects upon swallowing.  Laryngeal biopsy 07/06/17 revealed recurrence cancer.  Total laryngectomy completed 08/10/17.    Assessment / Plan / Recommendation Clinical Impression  Initial session spend introducing speech pathology, completing education and discussing options for communication.  We reviewed drawings of head/neck pre- and post-laryngectomy, discussed basic anatomic and functional changes, discussed electrolarynx, writing, articulation, and augmentative/alternative communication options.  Pt quite somber today - our review was only introductory in order not to overwhelm pt with information.  Pt generally  writing and mouthing words - articulation is rapid.  Will need instruction in use of intraoral pressures/articulation pacing to facilitate better communication.  Posted sign over bed informing staff  that pt is a "total neck breather" and informed unit secretary that pt should be checked on personally if he uses call bell given inability to speak.  D/W RN.  SLP will follow for communication, beginning process of ordering electrolarynx.     SLP Assessment  SLP Recommendation/Assessment: Patient needs continued Speech Lanaguage Pathology Services SLP Visit Diagnosis: Aphonia (R49.1)    Follow Up Recommendations  Outpatient SLP    Frequency and Duration min 3x week  2 weeks      SLP Evaluation Cognition  Overall Cognitive Status: Within Functional Limits for tasks assessed Orientation Level: Oriented X4       Comprehension  Auditory Comprehension Overall Auditory Comprehension: Appears within functional limits for tasks assessed Yes/No Questions: Within Functional Limits Reading Comprehension Reading Status: Within funtional limits    Expression Expression Primary Mode of Expression: Nonverbal - written Verbal Expression Overall Verbal Expression: Other (comment)(language is intact; pt is total laryngectomy) Written Expression Dominant Hand: Right Written Expression: Within Functional Limits   Oral / Motor  Oral Motor/Sensory Function Overall Oral Motor/Sensory Function: Other (comment)(adequate mobility of articulators) Motor Speech Overall Motor Speech: Other (comment)(articulates words quickly; needs cues to slow rate and use )   GO                    Jonathan [name] 08/12/2017, 10:30 AM

## 2017-08-13 LAB — GLUCOSE, CAPILLARY
GLUCOSE-CAPILLARY: 119 mg/dL — AB (ref 65–99)
GLUCOSE-CAPILLARY: 136 mg/dL — AB (ref 65–99)
GLUCOSE-CAPILLARY: 154 mg/dL — AB (ref 65–99)
GLUCOSE-CAPILLARY: 171 mg/dL — AB (ref 65–99)
Glucose-Capillary: 96 mg/dL (ref 65–99)
Glucose-Capillary: 141 mg/dL — ABNORMAL HIGH (ref 65–99)
Glucose-Capillary: 120 mg/dL — ABNORMAL HIGH (ref 65–99)
Glucose-Capillary: 147 mg/dL — ABNORMAL HIGH (ref 65–99)
Glucose-Capillary: 94 mg/dL (ref 65–99)

## 2017-08-13 LAB — MRSA PCR SCREENING: MRSA by PCR: NEGATIVE

## 2017-08-13 NOTE — Care Management Note (Addendum)
Case Management Note  Patient Details  Name: Jonathan [name] MRN: [id number] Date of Birth: [birth year]  Subjective/Objective:                    Action/Plan:  PCP Dr Philipp Ovens  Continuing to follow for discharge needs   5-10 Total laryngectomy 5-13 MD note Continue tube feeds.  Anticipate discharge in 3 or 4 days as well.  Anticipate oral liquids in 10 more days.SP following poss electrolarynx paperwork in shadow chart if needed.  SP will need to complete a section and patient/family will need to complete a section and mail application and all required information to address on form.   If patient discharges with tube feedings will need orders for tube feeds, pump and HHRN.  Bedside nurse will need to teach patient and wife how to administer tube feeds, medications through tube, flush tube etc.     Expected Discharge Date:                  Expected Discharge Plan:  [place]  In-House Referral:  Nutrition  Discharge planning Services  CM Consult  Post Acute Care Choice:  Durable Medical Equipment, Home Health Choice offered to:     DME Arranged:    DME Agency:     HH Arranged:    [place] Agency:     Status of Service:  In process, will continue to follow  If discussed at Long Length of Stay Meetings, dates discussed:    Additional Comments:  [name], RN 08/13/2017, 2:06 PM

## 2017-08-13 NOTE — Plan of Care (Signed)
Pt BP/HR WDL. Pt currently receiving tube feeds via cortrak. Pt complains of intermittent headaches and has received PRN pain medication. Pt is able to turn self in order to help  Pt bed in lowest position and call light within reach.

## 2017-08-13 NOTE — Progress Notes (Signed)
Patient ID: Jonathan [name], male   DOB: 1960/07/12, 57 y.o.   MRN: [id number] Subjective: Doing well, no complaints.  Pain is well controlled.  Objective: Vital signs in last 24 hours: Temp:  [98.3 F (36.8 C)-99.3 F (37.4 C)] 98.4 F (36.9 C) (05/13 0415) Pulse Rate:  [86-103] 94 (05/13 0700) Resp:  [14-21] 14 (05/13 0700) BP: (96-138)/(69-97) 103/77 (05/13 0700) SpO2:  [95 %-98 %] 98 % (05/13 0700) FiO2 (%):  [28 %] 28 % (05/13 0400) Weight:  [56.8 kg (125 lb 3.5 oz)] 56.8 kg (125 lb 3.5 oz) (05/13 0422) Weight change: 4.1 kg (9 lb 0.6 oz) Last BM Date: (PTA)  Intake/Output from previous day: 05/12 0701 - 05/13 0700 In: 2155 [I.V.:345; NG/GT:1560; IV Piggyback:250] Out: 1110 [Urine:1100; Drains:10] Intake/Output this shift: No intake/output data recorded.  PHYSICAL EXAM: Neck looks excellent.  Suture line intact clean no edema not does not swelling/infection.  Stoma is widely patent.  Slight crusting just above this easily.  Both drains were removed. 7 [place]  Lab Results: Recent Labs    08/11/17 0523  WBC 12.2*  HGB 13.3  HCT 40.8  PLT 222   BMET Recent Labs    08/11/17 0523  NA 136  K 4.0  CL 95*  CO2 31  GLUCOSE 122*  BUN 8  CREATININE 0.97  CALCIUM 9.0    Studies/Results: No results found.  Medications: I have reviewed the patient's current medications.  Assessment/Plan: Stable, no complications.  Transfer to floor.  Start ambulating.  Continue tube feeds.  Anticipate discharge in 3 or 4 days as well.  Anticipate oral liquids in 10 more days.  LOS: 3 days   Izora Gala 08/13/2017, 7:38 AM

## 2017-08-14 LAB — GLUCOSE, CAPILLARY
GLUCOSE-CAPILLARY: 128 mg/dL — AB (ref 65–99)
GLUCOSE-CAPILLARY: 107 mg/dL — AB (ref 65–99)
Glucose-Capillary: 97 mg/dL (ref 65–99)
Glucose-Capillary: 140 mg/dL — ABNORMAL HIGH (ref 65–99)

## 2017-08-14 MED ORDER — OSMOLITE 1.5 CAL PO LIQD
1000.0000 mL | ORAL | Status: DC
  Administered 2017-08-15 (×2): 1000 mL
  Filled 2017-08-14 (×5): qty 1000

## 2017-08-14 MED ORDER — PRO-STAT SUGAR FREE PO LIQD
30.0000 mL | Freq: Every day | ORAL | Status: DC
  Administered 2017-08-14 – 2017-08-16 (×3): 30 mL
  Filled 2017-08-14 (×3): qty 30

## 2017-08-14 NOTE — Plan of Care (Signed)
  Problem: Education: Goal: Knowledge of General Education information will improve Outcome: Progressing   Problem: Health Behavior/Discharge Planning: Goal: Ability to manage health-related needs will improve Outcome: Progressing   Problem: Clinical Measurements: Goal: Respiratory complications will improve Outcome: Progressing   

## 2017-08-14 NOTE — Progress Notes (Signed)
Nutrition Follow-up  DOCUMENTATION CODES:   Non-severe (moderate) malnutrition in context of chronic illness  INTERVENTION:   -Initiate Osmolite 1.5 @ 55 ml/hr via NGT   30 ml Prostat (or equivalent) daily.    Recommend 165 ml free water flush every 4 hours if no IVFS  Tube feeding regimen provides 2080 kcal (100% of needs), 98 grams of protein, and 1006 ml of H2O.   -If pt desires nocturnal feedings, recommend:  Osmolite 1.5 @ 110 ml/hr via NGT over 12 hour period (ex. 2100-0900)  30 ml Prostat (or equivalent) daily.    Recommend 165 ml free water flush every 4 hours if no IVFS  Tube feeding regimen provides 20180 kcal (100% of needs), 83 grams of protein, and 1006 ml of H2O.   NUTRITION DIAGNOSIS:   Moderate Malnutrition related to chronic illness(cancer) as evidenced by mild fat depletion, mild muscle depletion.  Ongoing  GOAL:   Patient will meet greater than or equal to 90% of their needs  Progressing  MONITOR:   TF tolerance, Labs, Weight trends  REASON FOR ASSESSMENT:   Consult Enteral/tube feeding initiation and management  ASSESSMENT:   57 yo male admitted with laryngeal cancer with total laryngectomy on 5/10. 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 Pt with additional hx of GERD, HLD  5/10- Total Laryngectomy 5/13- transferred from ICU to surgical floor  Case discussed with RNCM; pt to discharge home soon per MD. Pt will require TF from 10-14 days post-op.   Spoke with pt at bedside, who was in good spirits. He was able to communicate via head nods gestures, and writing. He is currently on trach collar. He denies any abdominal pain, nausea, or vomiting. He is tolerating TF well, however, complains to this TF that "it doesn't fill me up". He is eager to eat, however, understands need for tube feedings. Discussed with pt potential for increasing TF rate to assist with hunger.   Case discussed with RN, who confirms plan  to discharge home soon. TF education to be done later this afternoon.   Labs reviewed: CBGS: 94-147 (inpatient orders for glycemic control are 0-15 units insulin aspart TID with meals and 0-5 units insulin aspart q HS).   Diet Order:   Diet Order           Diet NPO time specified  Diet effective now          EDUCATION NEEDS:   Education needs have been addressed  Skin:  Skin Assessment: Skin Integrity Issues: Skin Integrity Issues:: Incisions Incisions: closed neck incision, closed lip incision  Last BM:  PTA  Height:   Ht Readings from Last 1 Encounters:  08/10/17 5\' 5"  (1.651 m)    Weight:   Wt Readings from Last 1 Encounters:  08/14/17 125 lb 10.6 oz (57 kg)    Ideal Body Weight:  59.1 kg  BMI:  Body mass index is 20.91 kg/m.  Estimated Nutritional Needs:   Kcal:  2000-2200  Protein:  95-110 grams  Fluid:  > 2.0 L    Lola Czerwonka A. Jimmye Norman, RD, LDN, CDE Pager: [phone] After hours Pager: [phone]

## 2017-08-14 NOTE — Care Management Note (Signed)
Case Management Note  Patient Details  Name: Jonathan [name] MRN: [id number] Date of Birth: 1961/01/07  Subjective/Objective:                    Action/Plan:  Spoke to patient and wife at bedside. Discussed home health and tube feedings . Both aware they will be taught how to adminster tube feedings and flush tube etc before discharge. Patient currently on trach collar 5 L L oxygen asked bedside nurse to check saturations and wean if possible. Patient already has humidified air at home through [place] . Patient would like to continue with humidified air.   Discussed tube feedings at home. Patient prefers to have continuous tube feedings at home instead of nocturnal feedings . Same ordered . [name] with [facility]  Expected Discharge Date:                  Expected Discharge Plan:  [place]  In-House Referral:  Nutrition  Discharge planning Services  CM Consult  Post Acute Care Choice:  Durable Medical Equipment, Home Health Choice offered to:  Patient  DME Arranged:  Tube feeding DME Agency:  [place]:  RN, Speech Therapy [place] Agency:  [place]  Status of Service:  In process, will continue to follow  If discussed at Long Length of Stay Meetings, dates discussed:    Additional Comments:  [name], RN 08/14/2017, 3:40 PM

## 2017-08-14 NOTE — Progress Notes (Signed)
Patient ID: Jonathan [name], male   DOB: April 19, 1960, 57 y.o.   MRN: [id number] Subjective: Awake and alert, no complaints.  Minimal pain.  Breathing easily.  Tolerating tube feeds well.  Objective: Vital signs in last 24 hours: Temp:  [98.2 F (36.8 C)-99.3 F (37.4 C)] 98.9 F (37.2 C) (05/14 0448) Pulse Rate:  [84-89] 85 (05/14 0448) Resp:  [14-20] 16 (05/14 0448) BP: (102-132)/(51-93) 117/86 (05/14 0448) SpO2:  [94 %-100 %] 99 % (05/14 0448) FiO2 (%):  [28 %] 28 % (05/14 0345) Weight:  [57 kg (125 lb 10.6 oz)] 57 kg (125 lb 10.6 oz) (05/14 0448) Weight change: 0.2 kg (7.1 oz) Last BM Date: (PTA)  Intake/Output from previous day: 05/13 0701 - 05/14 0700 In: 1440 [I.V.:10; NG/GT:1430] Out: 850 [Urine:850] Intake/Output this shift: No intake/output data recorded.  PHYSICAL EXAM: Stoma clean and healthy with no obstruction or crusting.  Incisions and flap look excellent.  No swelling.  Lab Results: No results for input(s): WBC, HGB, HCT, PLT in the last 72 hours. BMET No results for input(s): NA, K, CL, CO2, GLUCOSE, BUN, CREATININE, CALCIUM in the last 72 hours.  Studies/Results: No results found.  Medications: I have reviewed the patient's current medications.  Assessment/Plan: Continue tube feeds until postop day 14.  Ambulate more.  May remove humidified air as needed.  We will remove SCDs.  Discharge home in 1 or 2 days.  LOS: 4 days   Izora Gala 08/14/2017, 8:58 AM

## 2017-08-14 NOTE — Progress Notes (Addendum)
  Speech Language Pathology Treatment: Cognitive-Linquistic  Patient Details Name: Jonathan [name] MRN: [id number] DOB: 1960-08-07 Today's Date: 08/14/2017 Time: 7681-1572 SLP Time Calculation (min) (ACUTE ONLY): 21 min  Assessment / Plan / Recommendation Clinical Impression  Pt is very engaged in therapy today and in brighter spirits. He is very motivated to try the electrolarynx (EL); SLP portion of application was completed and the entire application was given to patient to finish. SLP demonstrated EL placement and use and provided Min verbal/tactile cues for placement of intraoral adapter. Pt had >75% intelligibility with one to two-word simple phrases, although with a drastic decline in intelligibility once he started talking in longer phrases. SLP provided Mod cues for over articulation, pausing, and chunking words. Pt has good insight and emergent awareness, which will help him be more successful with additional practice. Will continue to follow acutely; will need SLP f/u upon d/c as well.   HPI HPI: 57 y.o. male admitted for scheduled total laryngectomy.  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 OP SLP during treatment with focus on exercise program to minimize post-radiation effects upon swallowing.  Laryngeal biopsy 07/06/17 revealed recurrence cancer.  Total laryngectomy completed 08/10/17.       SLP Plan  Continue with current plan of care       Recommendations                   Follow up Recommendations: Outpatient SLP (preferred, but could start with [facility] SLP and transition to OP) SLP Visit Diagnosis: Aphonia (R49.1) Plan: Continue with current plan of care       GO                Germain Osgood 08/14/2017, 12:03 PM  Germain Osgood, M.A. CCC-SLP [phone]

## 2017-08-14 NOTE — Progress Notes (Signed)
RT came to do stoma chk. Staff at the bedside with pt. Pt unavailable at this time

## 2017-08-14 NOTE — Care Management Note (Signed)
Case Management Note  Patient Details  Name: Jonathan [name] MRN: [id number] Date of Birth: 12/03/1960  Subjective/Objective:                    Action/Plan:  Discussed discharge plan with patient at bedside. Per MD note patient needs tube feeding 14 days post op and discharge to home is one to two days.   Patient confirmed PCP is DR Velna Ochs , patient confirmed address on face sheet . Home phone is [phone] , his cell is [phone] ( at present cannot speak , writing ), wife's cell is [phone].   Explained dietitian will make recommendations for home tube feeds, cyclic, bolus or continuous. Hospital nurse will bring teaching here today ex flushing tube, giving meds etc( discussed with Suezanne Jacquet). Patient will also have home health speech therapy and RN.  Patient wrote he understand , asked NCM to come back at 1530 when wife will be present. NCM will return later today.    Expected Discharge Date:                  Expected Discharge Plan:  [place]  In-House Referral:  Nutrition  Discharge planning Services  CM Consult  Post Acute Care Choice:  Durable Medical Equipment, Home Health Choice offered to:  Patient  DME Arranged:  Tube feeding DME Agency:  [place] Arranged:  RN, Speech Therapy [place] Agency:  [place]  Status of Service:  In process, will continue to follow  If discussed at Long Length of Stay Meetings, dates discussed:    Additional Comments:  [name], RN 08/14/2017, 10:39 AM

## 2017-08-15 LAB — GLUCOSE, CAPILLARY
GLUCOSE-CAPILLARY: 163 mg/dL — AB (ref 65–99)
Glucose-Capillary: 131 mg/dL — ABNORMAL HIGH (ref 65–99)
Glucose-Capillary: 120 mg/dL — ABNORMAL HIGH (ref 65–99)
Glucose-Capillary: 111 mg/dL — ABNORMAL HIGH (ref 65–99)

## 2017-08-15 MED ORDER — HYDROCODONE-ACETAMINOPHEN 7.5-325 MG/15ML PO SOLN
15.0000 mL | Freq: Four times a day (QID) | ORAL | 0 refills | Status: DC | PRN
Start: 2017-08-15 — End: 2017-12-31

## 2017-08-15 NOTE — Progress Notes (Signed)
Patient ID: Jonathan [name], male   DOB: 08/15/1960, 57 y.o.   MRN: [id number]  Doing well. Working with Speech Path now learning to use the electrolarynx, and phonating nicely.  Stoma cleaned of large scab, but otherwise healthy.  Flaps look good, no swelling and no signs of leak.  Stable post op. Anticipate d/c tomorrow.

## 2017-08-15 NOTE — Progress Notes (Signed)
  Speech Language Pathology Treatment: Cognitive-Linquistic  Patient Details Name: Jonathan [name] MRN: [id number] DOB: 12-25-1960 Today's Date: 08/15/2017 Time: 9480-1655 SLP Time Calculation (min) (ACUTE ONLY): 24 min  Assessment / Plan / Recommendation Clinical Impression  Pt used his electrolarynx at the word level with Min cues for speech intelligibility strategies and adequate placement. Pt has good emerging awareness, self-correcting often when words are not intelligible. He primarily tries to mouth words, needing Min cues to remember to try using the EL. He continues to have to write longer phrases and sentences as this is more challenging for him to articulate. He is making good early progress with use of the EL. Educational book was given to pt which includes additional practice exercises in the back so he can work in between [place] visits. Will continue to follow acutely - he will need SLP f/u upon d/c   HPI HPI: 57 y.o. male admitted for scheduled total laryngectomy.  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 OP SLP during treatment with focus on exercise program to minimize post-radiation effects upon swallowing.  Laryngeal biopsy 07/06/17 revealed recurrence cancer.  Total laryngectomy completed 08/10/17.       SLP Plan  Continue with current plan of care       Recommendations                   Follow up Recommendations: Outpatient SLP Plan: Continue with current plan of care       GO                Jonathan [name] 08/15/2017, 10:30 AM  Jonathan [name], M.A. CCC-SLP [phone]

## 2017-08-15 NOTE — Progress Notes (Signed)
Nutrition Follow-up  DOCUMENTATION CODES:   Non-severe (moderate) malnutrition in context of chronic illness  INTERVENTION:   Continue Osmolite 1.5 @ 55 ml/hr via NGT   30 ml Prostat (or equivalent) daily.    Recommend 165 ml free water flush every 4 hours if no IVFS  Tube feeding regimen provides 2080 kcal (100% of needs), 98 grams of protein, and 1006 ml of H2O.   NUTRITION DIAGNOSIS:   Moderate Malnutrition related to chronic illness(cancer) as evidenced by mild fat depletion, mild muscle depletion.  Ongoing  GOAL:   Patient will meet greater than or equal to 90% of their needs  Met with TF  MONITOR:   TF tolerance, Labs, Weight trends  REASON FOR ASSESSMENT:   Consult Enteral/tube feeding initiation and management  ASSESSMENT:   57 yo male admitted with laryngeal cancer with total laryngectomy on 5/10. 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 Pt with additional hx of GERD, HLD  5/10- Total Laryngectomy 5/13- transferred from ICU to surgical floor  Pt in good spirits; ambulating hallways at time of visit.   Case discussed with RN, RNCM, and [place] representative Linna Hoff). Pt is tolerating new TF well. Per RNCM, pt has elected for continuous feedings at home to assist with feeling of hunger. Plan for possible discharge tomorrow (pt will require TF 10-14 days post-op and will undergo swallow evaluation after that).   Labs reviewed: CBGS: 97-163 (inpatient orders for glycemic control are 0-15 units insulin aspart TID with meals and 0-5 units insulin aspart q HS).   Diet Order:   Diet Order           Diet NPO time specified  Diet effective now          EDUCATION NEEDS:   Education needs have been addressed  Skin:  Skin Assessment: Skin Integrity Issues: Skin Integrity Issues:: Incisions Incisions: closed neck incision, closed lip incision  Last BM:  08/10/17  Height:   Ht Readings from Last 1 Encounters:   08/10/17 5' 5"  (1.651 m)    Weight:   Wt Readings from Last 1 Encounters:  08/15/17 127 lb 3.3 oz (57.7 kg)    Ideal Body Weight:  59.1 kg  BMI:  Body mass index is 21.17 kg/m.  Estimated Nutritional Needs:   Kcal:  2000-2200  Protein:  95-110 grams  Fluid:  > 2.0 L    Chalice Philbert A. Jimmye Norman, RD, LDN, CDE Pager: [phone] After hours Pager: [phone]

## 2017-08-15 NOTE — Discharge Instructions (Signed)
Do not eat or drink anything by mouth.   You may use liquid Ibuprofen (Motrin, Advil) through the tube if needed.  Clean any scabs from your stoma as needed.  If you develop any fever, neck swelling, trouble breathing, or any other problems, call our office immediately or go to the Emergency Room.

## 2017-08-16 LAB — GLUCOSE, CAPILLARY
Glucose-Capillary: 133 mg/dL — ABNORMAL HIGH (ref 65–99)
Glucose-Capillary: 105 mg/dL — ABNORMAL HIGH (ref 65–99)

## 2017-08-16 MED FILL — HYDROCOD-APAP 7.5-325/15ML: 7.5-325 | 7 days supply | Qty: 420 | Fill #0

## 2017-08-16 NOTE — Progress Notes (Signed)
Pt discharged home with wife in stable condition after going over discharge teaching with no concerns voiced. AVS and discharge script given before leaving. Home health already in place

## 2017-08-16 NOTE — Progress Notes (Signed)
6 Days Post-Op   Subjective/Chief Complaint: Doing well. No problems ready to go home   Objective: Vital signs in last 24 hours: Temp:  [98.7 F (37.1 C)-100.1 F (37.8 C)] 99.6 F (37.6 C) (05/16 1224) Pulse Rate:  [80-108] 90 (05/16 1224) Resp:  [14-18] 18 (05/16 1224) BP: (110-141)/(83-102) 122/92 (05/16 1224) SpO2:  [93 %-100 %] 94 % (05/16 1224) FiO2 (%):  [28 %] 28 % (05/16 1101) Last BM Date: 08/10/17  Intake/Output from previous day: 05/15 0701 - 05/16 0700 In: 0  Out: 700 [Emesis/NG output:700] Intake/Output this shift: No intake/output data recorded.  awake and alert. neck excellent without swelling. stoma open and breathing we.. cv regular. l normal effort. adb- soft. ext= no swelling or pain  Lab Results:  No results for input(s): WBC, HGB, HCT, PLT in the last 72 hours. BMET No results for input(s): NA, K, CL, CO2, GLUCOSE, BUN, CREATININE, CALCIUM in the last 72 hours. PT/INR No results for input(s): LABPROT, INR in the last 72 hours. ABG No results for input(s): PHART, HCO3 in the last 72 hours.  Invalid input(s): PCO2, PO2  Studies/Results: No results found.  Anti-infectives: Anti-infectives (From admission, onward)   Start     Dose/Rate Route Frequency Ordered Stop   08/10/17 1400  clindamycin (CLEOCIN) IVPB 600 mg  Status:  Discontinued     600 mg 100 mL/hr over 30 Minutes Intravenous Every 8 hours 08/10/17 1327 08/13/17 0742   08/10/17 0630  clindamycin (CLEOCIN) IVPB 900 mg     900 mg 100 mL/hr over 30 Minutes Intravenous  Once 08/10/17 0618 08/10/17 0755   08/10/17 0620  clindamycin (CLEOCIN) 900 MG/50ML IVPB    Note to Pharmacy:  Tamsen Snider   : cabinet override      08/10/17 0620 08/10/17 0755   08/09/17 0635  ceFAZolin (ANCEF) 2-4 GM/100ML-% IVPB    Note to Pharmacy:  Jasmine Pang   : cabinet override      08/09/17 0635 08/09/17 1844   08/08/17 0645  ceFAZolin (ANCEF) 2-4 GM/100ML-% IVPB    Note to Pharmacy:  Ardine Eng    : cabinet override      08/08/17 0645 08/08/17 1859      Assessment/Plan: s/p Procedure(s): TOTAL LARYNGECTOMY (N/A) he is ready to go home. HHRN arranged according to Dr Constance Holster and he will follow up in 1 week.   LOS: 6 days    Melissa Montane 08/16/2017

## 2017-08-16 NOTE — Progress Notes (Signed)
  Speech Language Pathology Treatment: Cognitive-Linquistic  Patient Details Name: Jonathan [name] MRN: [id number] DOB: [birth year] Today's Date: 08/16/2017 Time: 3710-6269 SLP Time Calculation (min) (ACUTE ONLY): 15 min  Assessment / Plan / Recommendation Clinical Impression  Pt continues to progress very well with his electrolarynx. He used it today to communicate longer phrases and short sentences with Min cues to increase speech intelligibility. SLP provided more education about potential use of voice prosthesis, with use of pictures from laryngectomy book. No further questions at this time - will continue to benefit from SLP f/u to maximize communication and self-maintenance of stoma.   HPI HPI: 57 y.o. male admitted for scheduled total laryngectomy.  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 OP SLP during treatment with focus on exercise program to minimize post-radiation effects upon swallowing.  Laryngeal biopsy 07/06/17 revealed recurrence cancer.  Total laryngectomy completed 08/10/17.       SLP Plan  Continue with current plan of care       Recommendations                   Follow up Recommendations: Outpatient SLP SLP Visit Diagnosis: Aphonia (R49.1) Plan: Continue with current plan of care       GO                Germain Osgood 08/16/2017, 11:29 AM  Germain Osgood, M.A. CCC-SLP [phone]

## 2017-08-18 ENCOUNTER — Emergency Department (HOSPITAL_COMMUNITY)
Admission: EM | Admit: 2017-08-18 | Discharge: 2017-08-18 | Disposition: A | Discharge status: Home / Self Care | Payer: Medicaid Other | Attending: Emergency Medicine | Admitting: Emergency Medicine

## 2017-08-18 ENCOUNTER — Encounter (HOSPITAL_COMMUNITY): Payer: Self-pay

## 2017-08-18 ENCOUNTER — Other Ambulatory Visit: Payer: Self-pay

## 2017-08-18 ENCOUNTER — Emergency Department (HOSPITAL_COMMUNITY): Payer: Medicaid Other

## 2017-08-18 DIAGNOSIS — Z87891 Personal history of nicotine dependence: Secondary | ICD-10-CM | POA: Diagnosis not present

## 2017-08-18 DIAGNOSIS — Z431 Encounter for attention to gastrostomy: Secondary | ICD-10-CM | POA: Diagnosis present

## 2017-08-18 DIAGNOSIS — Z0189 Encounter for other specified special examinations: Secondary | ICD-10-CM

## 2017-08-18 DIAGNOSIS — Z79899 Other long term (current) drug therapy: Secondary | ICD-10-CM | POA: Diagnosis not present

## 2017-08-18 MED ORDER — ONDANSETRON 4 MG PO TBDP
4.0000 mg | ORAL_TABLET | Freq: Once | ORAL | Status: AC
  Administered 2017-08-18: 4 mg via ORAL
  Filled 2017-08-18: qty 1

## 2017-08-18 MED ORDER — MORPHINE SULFATE (PF) 4 MG/ML IV SOLN
4.0000 mg | Freq: Once | INTRAVENOUS | Status: AC
  Administered 2017-08-18: 4 mg via INTRAMUSCULAR
  Filled 2017-08-18: qty 1

## 2017-08-18 NOTE — ED Notes (Signed)
Bed: [facility]4 Expected date:  Expected time:  Means of arrival:  Comments: 

## 2017-08-18 NOTE — Discharge Instructions (Signed)
Your NG tube appears to be in place and is ready for use again.  Please resume your tube feedings and medications as directed.  Continue with stoma suctioning as directed as well and follow-up with Dr. Constance Holster next week.  Return to the emergency department if NG tube becomes displaced again, you develop fevers, chest pain, shortness of breath, abdominal pain or any other new or concerning symptoms.

## 2017-08-18 NOTE — ED Triage Notes (Signed)
He has a tracheostomy (healed) and also has a Panda-type feeding tube. He is here specifically because his Panda tube "came out; and then I put it back myself and the home-health nurse told me she couldn't start my feeding until we know it's in the right place." he also requests that out R.T. (Kenan) suction his trach, which he does.

## 2017-08-18 NOTE — ED Provider Notes (Signed)
[facility] DEPT Provider Note   CSN: [id number] Arrival date & time: 08/18/17  1226     History   Chief Complaint Chief Complaint  Patient presents with  . GI Problem    HPI Jonathan [name] is a 57 y.o. male.  Jonathan [name] is a 57 y.o. Male with a history of laryngeal cancer, status post recent laryngectomy and stoma creation with Dr. Constance Holster, as well as GERD, hyperlipidemia, and arthritis, who presents to the emergency department for evaluation after his feeding tube came out.  Patient had NG tube placed during surgery, and reports it came out last night, patient put the tube back in place himself, but his home health nurse told him that he could not use the feeding tube until he knew it was in the correct place.  Patient also reports he is in a large amount of pain, he has been prescribed liquid Percocet to use through the NG tube but has been unable to use until placement has been confirmed.  Patient still has staples in place from tracheostomy and laryngectomy surgery, has follow-up appointment with Dr. Constance Holster in the office next week for removal of NG tube and staples.  Patient has been suctioning his stoma without difficulty at home, this was suctioned by respiratory therapy here in the ED as well.  Patient denies any fevers, chest pain, shortness of breath or abdominal pain. Pt reports he has suction machine and supplies at home.     Past Medical History:  Diagnosis Date  . Allergic rhinitis   . Arthritis    Bilateral knee arthritis for about 20 years. Not taking any medications, believes in nature processes  . Cancer (HCC)    larynx  . Colon polyp   . Dental caries    prechemoradiation therapy, periodontitis  . GERD (gastroesophageal reflux disease)   . Headache   . History of radiation therapy 09/11/16- 10/30/16   Larynx 70 Gy in 35 fractions  . HLD (hyperlipidemia)   . Pneumonia    as a child    Patient Active Problem List   Diagnosis Date  Noted  . S/P laryngectomy 08/10/2017  . Malnutrition of moderate degree 08/10/2017  . Cerumen impaction 07/10/2017  . 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 Elevated BP without diagnosis of hypertension 05/21/2017  . Nasal drainage 05/21/2017  . Preventive measure 04/13/2017  . Pancytopenia, acquired ([place]) 11/21/2016  . Anemia due to antineoplastic chemotherapy 06/27/2016  . Exostoses, multiple 05/15/2016  . 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 Retained dental root 05/15/2016  . 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 Hoarseness 11/21/2015  . Tooth decay 06/03/2015  . History of colonic polyps 03/08/2015    Past Surgical History:  Procedure Laterality Date  . COLONOSCOPY  03/18/2012  . DIRECT LARYNGOSCOPY N/A 07/06/2017   Procedure: DIRECT LARYNGOSCOPY;  Surgeon: Izora Gala, MD;  Location: [facility];  Service: ENT;  Laterality: N/A;  . ESOPHAGOSCOPY  04/24/2016   Procedure: ESOPHAGOSCOPY;  Surgeon: Izora Gala, MD;  Location: [facility];  Service: ENT;;  . IR GENERIC HISTORICAL  06/02/2016   IR US GUIDE VASC ACCESS RIGHT 06/02/2016 Sandi Mariscal, MD [facility]  . IR GENERIC HISTORICAL  06/02/2016   IR FLUORO GUIDE PORT INSERTION RIGHT 06/02/2016 Sandi Mariscal, MD [facility]  . LARYNGETOMY N/A 08/10/2017   Procedure: TOTAL LARYNGECTOMY;  Surgeon: Izora Gala, MD;  Location: [facility];  Service: ENT;  Laterality: N/A;  . LARYNGOSCOPY  04/24/2016  Procedure: LARYNGOSCOPY;  Surgeon: Izora Gala, MD;  Location: [facility];  Service: ENT;;  . MULTIPLE EXTRACTIONS WITH ALVEOLOPLASTY N/A 05/26/2016   Procedure: Extraction of tooth #'s 2,5,6,8-13, 21-28 with alveoloplasty, bilateral mandibular tori reductions and buccal exostoses reductions of maxillary left, maxillary right, and mandibular left quadrants.;  Surgeon: Lenn Cal, DDS;  Location: [facility];  Service: Oral Surgery;  Laterality: N/A;  . TRACHEOSTOMY TUBE PLACEMENT N/A 04/24/2016   Procedure: TRACHEOSTOMY;  Surgeon:  Izora Gala, MD;  Location: [facility];  Service: ENT;  Laterality: N/A;        Home Medications    Prior to Admission medications   Medication Sig Start Date End Date Taking? Authorizing Provider  HYDROcodone-acetaminophen (HYCET) 7.5-325 mg/15 ml solution Place 15 mLs into feeding tube 4 (four) times daily as needed for moderate pain. 08/15/17   Izora Gala, MD  SUPREP BOWEL PREP KIT 17.5-3.13-1.6 GM/177ML SOLN Take 1 kit by mouth as directed. Patient not taking: Reported on 08/02/2017 05/14/17   Pyrtle, Lajuan Lines, MD    Family History Family History  Problem Relation Age of Onset  . Prostate cancer Maternal Uncle 66       On radiation therapy now, diagnosed in around June 2012  . Colon cancer Maternal Uncle   . Heart attack Paternal Grandfather        Diet due to MI  . Lung cancer Paternal Aunt   . Brain cancer Paternal Aunt   . Kidney disease Maternal Uncle   . Hypertension Mother   . CAD Father   . Colon polyps Neg Hx   . Diabetes Neg Hx   . Gallbladder disease Neg Hx   . Esophageal cancer Neg Hx     Social History Social History   Tobacco Use  . Smoking status: Former Smoker    Packs/day: 0.50    Years: 30.00    Pack years: 15.00    Types: Cigarettes    Last attempt to quit: 12/31/2015    Years since quitting: 1.6  . Smokeless tobacco: Never Used  . Tobacco comment: not everyday-working on stopping completely   Substance Use Topics  . Alcohol use: No    Alcohol/week: 1.2 oz    Types: 2 Cans of beer per week  . Drug use: No    Frequency: 2.0 times per week    Comment: denies     Allergies   Patient has no known allergies.   Review of Systems Review of Systems  Constitutional: Negative for chills and fever.  HENT:       Stoma in place  Eyes: Negative for visual disturbance.  Respiratory: Positive for cough. Negative for shortness of breath.   Cardiovascular: Negative for chest pain.  Gastrointestinal: Negative for abdominal pain.  Genitourinary: Negative  for dysuria.  Musculoskeletal: Negative for arthralgias and myalgias.  Skin: Negative for color change and rash.  Neurological: Negative for syncope and light-headedness.  All other systems reviewed and are negative.    Physical Exam Updated Vital Signs BP (!) 140/110 (BP Location: Left Arm)   Pulse 100   Temp 98.6 F (37 C) (Oral)   Resp 20   Ht 5' 5"  (1.651 m)   Wt 52.6 kg (116 lb)   SpO2 99%   BMI 19.30 kg/m   Physical Exam  Constitutional: He is oriented to person, place, and time. He appears well-developed and well-nourished. No distress.  Pt nonverbal 2/2 stoma but able to mouth words and motion to communicate  HENT:  Head: Normocephalic and atraumatic.  Eyes: Right eye exhibits no discharge. Left eye exhibits no discharge.  Neck:  Staples in places surrounding stoma, no surrounding erythema or swelling, pt easily suctioning stoma, occasional cough  Cardiovascular: Normal rate, regular rhythm, normal heart sounds and intact distal pulses.  Pulmonary/Chest: Effort normal. No stridor. No respiratory distress. He has no wheezes. He has no rales.  Transmitted upper airway sounds from stoma, but lungs clear with good air movement, respirations easy and unlabored  Abdominal: Soft. Bowel sounds are normal. He exhibits no distension and no mass. There is no tenderness. There is no guarding.  Neurological: He is alert and oriented to person, place, and time. Coordination normal.  Skin: Skin is warm and dry. He is not diaphoretic.  Psychiatric: He has a normal mood and affect. His behavior is normal.  Nursing note and vitals reviewed.    ED Treatments / Results  Labs (all labs ordered are listed, but only abnormal results are displayed) Labs Reviewed - No data to display  EKG None  Radiology Dg Abdomen 1 View  Result Date: 08/18/2017 CLINICAL DATA:  Assess feeding tube. EXAM: ABDOMEN - 1 VIEW COMPARISON:  None. FINDINGS: Examination demonstrates enteric tube with tip  over the left mid abdomen likely over the distal stomach. There is a central venous catheter with tip over the cavoatrial junction. Bowel gas pattern is nonobstructive. Remainder the exam is unremarkable. IMPRESSION: No acute findings. Tubes and lines as described. Electronically Signed   By: Marin Olp M.D.   On: 08/18/2017 13:40    Procedures Procedures (including critical care time)  Medications Ordered in ED Medications  ondansetron (ZOFRAN-ODT) disintegrating tablet 4 mg (4 mg Oral Given 08/18/17 1411)  morphine 4 MG/ML injection 4 mg (4 mg Intramuscular Given 08/18/17 1411)     Initial Impression / Assessment and Plan / ED Course  I have reviewed the triage vital signs and the nursing notes.  Pertinent labs & imaging results that were available during my care of the patient were reviewed by me and considered in my medical decision making (see chart for details).  Patient presents for confirmation of NG tube placement, it came out last night and he was able to replace it himself, he has not been able to receive tube feeding the medicines through it until confirmation is placed.  Patient recently had laryngectomy with Dr. Constance Holster with ENT, staples in place, this appears to be healing extremely well, patient is suctioning his stoma without difficulty, he denies any fevers.  Patient reports increasing pain because he has not been able to use his liquid Percocet through NG tube.  Patient is hypertensive here but I feel this is likely due to pain, patient reports his blood pressure has been stable at home he denies any chest pain, shortness of breath, headaches, vision changes or other symptoms concerning for a hypertensive urgency or emergency.  I have reviewed abdominal x-ray myself, the distal portion of the NG tube appears to be over the distal stomach, nursing able to draw back gastric contents without difficulty.  This time it appears NG tube is in place and ready for use.  Will give IM dose  of morphine to ease patient's pain until he is able to get home and continue with his usual home pain regimen and feedings.  Patient has follow-up appointment with Dr. Constance Holster next week.  Return precautions discussed.  Patient expresses understanding and is in agreement with plan.  Discussed with Dr. Lita Mains  who reviewed x-ray as well and is in agreement with plan.  Final Clinical Impressions(s) / ED Diagnoses   Final diagnoses:  Encounter for imaging study to confirm nasogastric (NG) tube placement    ED Discharge Orders    None       Jacqlyn Larsen, [place] 08/18/17 1431    Julianne Rice, MD 08/19/17 [phone]

## 2017-08-21 ENCOUNTER — Telehealth: Payer: Self-pay | Admitting: *Deleted

## 2017-08-21 NOTE — Telephone Encounter (Signed)
Miralax should not clog NG tube, make sure it is dissolved in at least 8 oz of water or more and wait 5 minutes after mixing for it to dissolve. His renal function is normal so mag citrate is an option if miralax is ineffective, would start with 1/2 a bottle first. If no response, can try the full bottle the next day. Other option would be to try liquid lactulose, suppositories, or enemas. Let me know if he needs prescriptions.

## 2017-08-21 NOTE — Telephone Encounter (Signed)
Jonathan Pimple, RN with [facility], called requesting order for something for constipation. Patient has straight NG tube. Has not had a BM since 08/10/2017. Has order for miralax but this will most likely clog NG tube. Jonathan [name] recommends mag citrate. Will route to PCP for consideration. Hubbard Hartshorn, RN, BSN

## 2017-08-23 ENCOUNTER — Telehealth: Payer: Self-pay | Admitting: Internal Medicine

## 2017-08-23 NOTE — Telephone Encounter (Signed)
Reassured his wife

## 2017-08-23 NOTE — Telephone Encounter (Signed)
What has he tried? Please have patient schedule an ACC visit tomorrow. We need to make sure he isn't impacted. Let him know that I am not in clinic and that he will be seeing another resident. Reassure him that I know what's going on and will be in contact with whichever resident he sees. Thank you.

## 2017-08-23 NOTE — Telephone Encounter (Signed)
Did he follow recs by Dr Alan Ripper - see note 5/22? Would have him try a fleets enema. 1 bottle of mag citrate. Continue miralax - can take 3x daily until BM then decrease. Constipation may be related to hyrdocodone 7.5 mg that he is on. We may need to use relistor, expensive, comes as a 450 mg tab: don't know if it can be crushed; also comes as a 8 mg SQ injection- he can check w his Oncologist Dr Alvy Bimler.

## 2017-08-23 NOTE — Telephone Encounter (Signed)
Please advise 

## 2017-08-23 NOTE — Telephone Encounter (Signed)
[facility] said patient haven't had a bowel movement in 2weeks, pls contact

## 2017-08-23 NOTE — Telephone Encounter (Signed)
appt made 5/24 at 1515

## 2017-08-24 ENCOUNTER — Ambulatory Visit: Payer: Self-pay

## 2017-08-24 MED FILL — HYDROCOD-APAP 7.5-325/15ML: 7.5-325 | 4 days supply | Qty: 418 | Fill #0

## 2017-08-24 NOTE — Telephone Encounter (Signed)
Jonathan [name] notified to use miralax. Will call back if no symptom relief. Hubbard Hartshorn, RN, BSN

## 2017-08-28 MED FILL — HYDROCOD-APAP 7.5-325/15ML: 7.5-325 | 4 days supply | Qty: 418 | Fill #0

## 2017-09-01 DIAGNOSIS — C329 Malignant neoplasm of larynx, unspecified: Secondary | ICD-10-CM | POA: Diagnosis not present

## 2017-09-01 DIAGNOSIS — E440 Moderate protein-calorie malnutrition: Secondary | ICD-10-CM | POA: Diagnosis not present

## 2017-09-04 NOTE — Discharge Summary (Signed)
Physician Discharge Summary  Patient ID: Jonathan [name] MRN: [id number] DOB/AGE: Dec 18, 1960 57 y.o.  Admit date: 08/10/2017 Discharge date: 09/04/2017  Admission Diagnoses: Laryngeal cancer  Discharge Diagnoses:  Active Problems:   S/P laryngectomy   Malnutrition of moderate degree   Discharged Condition: good  Hospital Course: No complications  Consults: none  Significant Diagnostic Studies: none  Treatments: surgery: Total laryngectomy  Discharge Exam: Blood pressure (!) 122/92, pulse 90, temperature 99.6 F (37.6 C), temperature source Oral, resp. rate 18, height 5' 5"  (1.651 m), weight 57.7 kg (127 lb 3.3 oz), SpO2 94 %. PHYSICAL EXAM: Nasogastric feeding tube in place.  Stoma is widely patent and clear/healthy.  Suture line intact with staples.  No swelling.  No evidence of wound breakdown or fistula.  Disposition:   Discharge Instructions    Call MD for:  difficulty breathing, headache or visual disturbances   Complete by:  As directed    Call MD for:  extreme fatigue   Complete by:  As directed    Call MD for:  hives   Complete by:  As directed    Call MD for:  persistant dizziness or light-headedness   Complete by:  As directed    Call MD for:  persistant nausea and vomiting   Complete by:  As directed    Call MD for:  redness, tenderness, or signs of infection (pain, swelling, redness, odor or green/yellow discharge around incision site)   Complete by:  As directed    Call MD for:  severe uncontrolled pain   Complete by:  As directed    Call MD for:  temperature >100.4   Complete by:  As directed    Diet - low sodium heart healthy   Complete by:  As directed    Increase activity slowly   Complete by:  As directed      Allergies as of 08/16/2017   No Known Allergies     Medication List    STOP taking these medications   oxyCODONE-acetaminophen 5-325 MG tablet Commonly known as:  PERCOCET/ROXICET     TAKE these medications    HYDROcodone-acetaminophen 7.5-325 mg/15 ml solution Commonly known as:  HYCET Place 15 mLs into feeding tube 4 (four) times daily as needed for moderate pain.   SUPREP BOWEL PREP KIT 17.5-3.13-1.6 GM/177ML Soln Generic drug:  Na Sulfate-K Sulfate-Mg Sulf Take 1 kit by mouth as directed.      [facility] Follow up.   Why:  home health nurse and speech therapy  Contact information: [address] [place] [address] [phone]        Izora Gala, MD. Schedule an appointment as soon as possible for a visit in 1 week(s).   Specialty:  Otolaryngology Contact information: [address] [name] [address] [phone]           Signed: Izora Gala 09/04/2017, 5:10 PM

## 2017-09-20 DIAGNOSIS — Y842 Radiological procedure and radiotherapy as the cause of abnormal reaction of the patient, or of later complication, without mention of misadventure at the time of the procedure: Secondary | ICD-10-CM | POA: Diagnosis not present

## 2017-09-20 DIAGNOSIS — G8928 Other chronic postprocedural pain: Secondary | ICD-10-CM | POA: Diagnosis not present

## 2017-09-20 DIAGNOSIS — Z9002 Acquired absence of larynx: Secondary | ICD-10-CM | POA: Diagnosis not present

## 2017-09-20 DIAGNOSIS — C329 Malignant neoplasm of larynx, unspecified: Secondary | ICD-10-CM | POA: Diagnosis not present

## 2017-09-20 DIAGNOSIS — E440 Moderate protein-calorie malnutrition: Secondary | ICD-10-CM | POA: Diagnosis not present

## 2017-09-20 DIAGNOSIS — H6120 Impacted cerumen, unspecified ear: Secondary | ICD-10-CM | POA: Diagnosis not present

## 2017-09-20 DIAGNOSIS — Z930 Tracheostomy status: Secondary | ICD-10-CM | POA: Diagnosis not present

## 2017-10-01 DIAGNOSIS — H6120 Impacted cerumen, unspecified ear: Secondary | ICD-10-CM | POA: Diagnosis not present

## 2017-10-01 DIAGNOSIS — Z930 Tracheostomy status: Secondary | ICD-10-CM | POA: Diagnosis not present

## 2017-10-01 DIAGNOSIS — C329 Malignant neoplasm of larynx, unspecified: Secondary | ICD-10-CM | POA: Diagnosis not present

## 2017-10-01 DIAGNOSIS — Y842 Radiological procedure and radiotherapy as the cause of abnormal reaction of the patient, or of later complication, without mention of misadventure at the time of the procedure: Secondary | ICD-10-CM | POA: Diagnosis not present

## 2017-10-01 DIAGNOSIS — E440 Moderate protein-calorie malnutrition: Secondary | ICD-10-CM | POA: Diagnosis not present

## 2017-10-01 DIAGNOSIS — Z9002 Acquired absence of larynx: Secondary | ICD-10-CM | POA: Diagnosis not present

## 2017-10-01 DIAGNOSIS — G8928 Other chronic postprocedural pain: Secondary | ICD-10-CM | POA: Diagnosis not present

## 2017-12-17 ENCOUNTER — Ambulatory Visit (INDEPENDENT_AMBULATORY_CARE_PROVIDER_SITE_OTHER): Payer: Medicaid Other | Admitting: Internal Medicine

## 2017-12-17 ENCOUNTER — Encounter: Payer: Self-pay | Admitting: Internal Medicine

## 2017-12-17 VITALS — BP 154/104 | HR 83 | Temp 99.1°F | Ht 65.0 in | Wt 138.2 lb

## 2017-12-17 DIAGNOSIS — M25562 Pain in left knee: Secondary | ICD-10-CM

## 2017-12-17 DIAGNOSIS — Z923 Personal history of irradiation: Secondary | ICD-10-CM | POA: Diagnosis not present

## 2017-12-17 DIAGNOSIS — Z23 Encounter for immunization: Secondary | ICD-10-CM | POA: Diagnosis not present

## 2017-12-17 DIAGNOSIS — R030 Elevated blood-pressure reading, without diagnosis of hypertension: Secondary | ICD-10-CM | POA: Diagnosis not present

## 2017-12-17 DIAGNOSIS — M170 Bilateral primary osteoarthritis of knee: Secondary | ICD-10-CM | POA: Insufficient documentation

## 2017-12-17 DIAGNOSIS — C329 Malignant neoplasm of larynx, unspecified: Secondary | ICD-10-CM

## 2017-12-17 DIAGNOSIS — G8929 Other chronic pain: Secondary | ICD-10-CM

## 2017-12-17 DIAGNOSIS — M25561 Pain in right knee: Secondary | ICD-10-CM | POA: Insufficient documentation

## 2017-12-17 DIAGNOSIS — R51 Headache: Secondary | ICD-10-CM | POA: Diagnosis not present

## 2017-12-17 DIAGNOSIS — R519 Headache, unspecified: Secondary | ICD-10-CM | POA: Insufficient documentation

## 2017-12-17 MED ORDER — NAPROXEN 500 MG PO TABS: 500.0000 mg | ORAL_TABLET | Freq: Two times a day (BID) | ORAL | 0 refills | Status: DC

## 2017-12-17 MED FILL — NAPROXEN 500 MG TABLET: 500 | 15 days supply | Qty: 30 | Fill #0

## 2017-12-17 NOTE — Assessment & Plan Note (Signed)
Patient is here with complaint of bilateral diffuse knee pain. Pain is chronic but has worsened in intensity over the past month. Pain is worse with weight bearing activities and is relieved some with rest. He denies exacerbation with walking up and down stairs. No weakness or instability. He does complain of bilateral crepitus. On exam strength is intact. There is no ligament laxity, no effusions. There is evidence of bony enlargement. Discussed likely diagnosis of osteoarthritis. He has been taking OTC tylenol and ibuprofen without relief. We discussed management options with high dose NSAIDs vs. Intraarticular steroid injections. He has declined injections today. Agreeable to 2 weeks of prescription strength naproxen with close follow up.  -- Naproxen 500 mg BID with food -- Follow up 2 weeks, if no improvement, patient will consider steroid injections

## 2017-12-17 NOTE — Patient Instructions (Addendum)
FOLLOW-UP INSTRUCTIONS When: 2 weeks For: Knee pain and headache follow up What to bring: Medicines  Jonathan [name],   It was a pleasure to see you! I am glad you are doing well. I am sorry to hear about your knee pain. I have sent a prescription to your pharmacy for high dose naproxen. Please take this twice a day with food for the next 2 weeks. Please schedule a follow up appointment in 2 weeks. You may cancel if you are better. If not, we can consider knee injections for the pain. This medicine should help your headache as well. If not improved, we can address this at your next visit as well. If you have any questions or concerns, call our clinic at [phone] or after hours call [phone] and ask for the internal medicine resident on call. Thank you!  Dr. Philipp Ovens   Osteoarthritis Osteoarthritis is a type of arthritis that affects tissue that covers the ends of bones in joints (cartilage). Cartilage acts as a cushion between the bones and helps them move smoothly. Osteoarthritis results when cartilage in the joints gets worn down. Osteoarthritis is sometimes called "wear and tear" arthritis. Osteoarthritis is the most common form of arthritis. It often occurs in older people. It is a condition that gets worse over time (a progressive condition). Joints that are most often affected by this condition are in:  Fingers.  Toes.  Hips.  Knees.  Spine, including neck and lower back.  What are the causes? This condition is caused by age-related wearing down of cartilage that covers the ends of bones. What increases the risk? The following factors may make you more likely to develop this condition:  Older age.  Being overweight or obese.  Overuse of joints, such as in athletes.  Past injury of a joint.  Past surgery on a joint.  Family history of osteoarthritis.  What are the signs or symptoms? The main symptoms of this condition are pain, swelling, and stiffness in the joint.  The joint may lose its shape over time. Small pieces of bone or cartilage may break off and float inside of the joint, which may cause more pain and damage to the joint. Small deposits of bone (osteophytes) may grow on the edges of the joint. Other symptoms may include:  A grating or scraping feeling inside the joint when you move it.  Popping or creaking sounds when you move.  Symptoms may affect one or more joints. Osteoarthritis in a major joint, such as your knee or hip, can make it painful to walk or exercise. If you have osteoarthritis in your hands, you might not be able to grip items, twist your hand, or control small movements of your hands and fingers (fine motor skills). How is this diagnosed? This condition may be diagnosed based on:  Your medical history.  A physical exam.  Your symptoms.  X-rays of the affected joint(s).  Blood tests to rule out other types of arthritis.  How is this treated? There is no cure for this condition, but treatment can help to control pain and improve joint function. Treatment plans may include:  A prescribed exercise program that allows for rest and joint relief. You may work with a physical therapist.  A weight control plan.  Pain relief techniques, such as: ? Applying heat and cold to the joint. ? Electric pulses delivered to nerve endings under the skin (transcutaneous electrical nerve stimulation, or TENS). ? Massage. ? Certain nutritional supplements.  NSAIDs or prescription  medicines to help relieve pain.  Medicine to help relieve pain and inflammation (corticosteroids). This can be given by mouth (orally) or as an injection.  Assistive devices, such as a brace, wrap, splint, specialized glove, or cane.  Surgery, such as: ? An osteotomy. This is done to reposition the bones and relieve pain or to remove loose pieces of bone and cartilage. ? Joint replacement surgery. You may need this surgery if you have very bad (advanced)  osteoarthritis.  Follow these instructions at home: Activity  Rest your affected joints as directed by your health care provider.  Do not drive or use heavy machinery while taking prescription pain medicine.  Exercise as directed. Your health care provider or physical therapist may recommend specific types of exercise, such as: ? Strengthening exercises. These are done to strengthen the muscles that support joints that are affected by arthritis. They can be performed with weights or with exercise bands to add resistance. ? Aerobic activities. These are exercises, such as brisk walking or water aerobics, that get your heart pumping. ? Range-of-motion activities. These keep your joints easy to move. ? Balance and agility exercises. Managing pain, stiffness, and swelling  If directed, apply heat to the affected area as often as told by your health care provider. Use the heat source that your health care provider recommends, such as a moist heat pack or a heating pad. ? If you have a removable assistive device, remove it as told by your health care provider. ? Place a towel between your skin and the heat source. If your health care provider tells you to keep the assistive device on while you apply heat, place a towel between the assistive device and the heat source. ? Leave the heat on for 20-30 minutes. ? Remove the heat if your skin turns bright red. This is especially important if you are unable to feel pain, heat, or cold. You may have a greater risk of getting burned.  If directed, put ice on the affected joint: ? If you have a removable assistive device, remove it as told by your health care provider. ? Put ice in a plastic bag. ? Place a towel between your skin and the bag. If your health care provider tells you to keep the assistive device on during icing, place a towel between the assistive device and the bag. ? Leave the ice on for 20 minutes, 2-3 times a day. General  instructions  Take over-the-counter and prescription medicines only as told by your health care provider.  Maintain a healthy weight. Follow instructions from your health care provider for weight control. These may include dietary restrictions.  Do not use any products that contain nicotine or tobacco, such as cigarettes and e-cigarettes. These can delay bone healing. If you need help quitting, ask your health care provider.  Use assistive devices as directed by your health care provider.  Keep all follow-up visits as told by your health care provider. This is important. Where to find more information:  [employer] of Arthritis and Musculoskeletal and Skin Diseases: www.niams.SouthExposed.es  [employer] on Aging: http://kim-miller.com/  American College of Rheumatology: www.rheumatology.org Contact a health care provider if:  Your skin turns red.  You develop a rash.  You have pain that gets worse.  You have a fever along with joint or muscle aches. Get help right away if:  You lose a lot of weight.  You suddenly lose your appetite.  You have night sweats. Summary  Osteoarthritis is a  type of arthritis that affects tissue covering the ends of bones in joints (cartilage).  This condition is caused by age-related wearing down of cartilage that covers the ends of bones.  The main symptom of this condition is pain, swelling, and stiffness in the joint.  There is no cure for this condition, but treatment can help to control pain and improve joint function. This information is not intended to replace advice given to you by your health care provider. Make sure you discuss any questions you have with your health care provider. Document Released: 03/20/2005 Document Revised: 11/22/2015 Document Reviewed: 11/22/2015 Elsevier Interactive Patient Education  [employer].

## 2017-12-17 NOTE — Assessment & Plan Note (Signed)
Patient has chronic diffuse headache that pre-dates the diagnosis of his cancer two years ago. His headache has not changed in character or intensity. It is daily and constant. He denies visual changes, no weakness. Physical exam is without neurological deficit. Brain MRI performed 6 months ago for his cancer surveillance was negative for evidence of intracranial metastases or acute changes. It did not a large right frontal venous anomaly that is likely incidental and unrelated to his headache. Patient has been taking daily tylenol and ibuprofen over the counter. He has been off opioid medication now for 3 months. Unclear reason for headache. We discussed the possibility of a medication over use headache. I have prescribed a 2 week trial of high dose prescription naproxen for bilateral knee osteoarthritis pain. If high dose naproxen does not improve his headache or exacerbates it, we discussed coming off all medication and monitoring. Knee pain can be treated with steroid injections and topicals.  -- Naproxen BID x 2 weeks -- If not improvement, trial off medication for possible medication over use headache

## 2017-12-17 NOTE — Progress Notes (Signed)
   CC: Knee pain  HPI:  Mr.Twan E Brunkhorst is a 57 y.o. male with past medical history outlined below here for bilateral knee pain. For the details of today's visit, please refer to the assessment and plan.  Past Medical History:  Diagnosis Date  . Allergic rhinitis   . Arthritis    Bilateral knee arthritis for about 20 years. Not taking any medications, believes in nature processes  . Cancer (HCC)    larynx  . Colon polyp   . Dental caries    prechemoradiation therapy, periodontitis  . GERD (gastroesophageal reflux disease)   . Headache   . History of radiation therapy 09/11/16- 10/30/16   Larynx 70 Gy in 35 fractions  . HLD (hyperlipidemia)   . Pneumonia    as a child    Review of Systems  Constitutional: Negative for chills and fever.  Eyes: Negative for blurred vision.  Neurological: Positive for headaches. Negative for focal weakness and weakness.    Physical Exam:  Vitals:   12/17/17 1330 12/17/17 1407  BP: (!) 152/97 (!) 154/104  Pulse: 82 83  Temp: 99.1 F (37.3 C)   TempSrc: Oral   SpO2: 100%   Weight: 138 lb 3.2 oz (62.7 kg)   Height: 5\' 5"  (1.651 m)     Constitutional: NAD, appears comfortable HEENT: PERRL, extraocular muscles intact, well healing laryngectomy stoma Cardiovascular: RRR, no murmurs, rubs, or gallops.  Pulmonary/Chest: CTAB, no wheezes, rales, or rhonchi.  Extremities: Warm and well perfused. No edema. Bony enlargement of bilateral knees. No ligament laxity. No tenderness to palpation of patellar or quadricepts tendon. No effusion. Neurological: A&Ox3, CN II - XII grossly intact.   Assessment & Plan:   See Encounters Tab for problem based charting.  Patient discussed with Dr. Daryll Drown

## 2017-12-17 NOTE — Assessment & Plan Note (Addendum)
Blood pressure is elevated again today in clinic, 152/97 and 154/104 on repeat. Patient reports he does check is blood pressure at home and is always normal. He has documented elevated blood pressure readings at prior visits, however readings are variable on chart review and often within normal range. He has agreed to checking BP at home daily for 2 weeks and return with BP log for follow up to rule out white coat hypertension. If still elevated, patient may benefit from a low dose antihypertensive.  -- Follow up BP log

## 2017-12-25 NOTE — Progress Notes (Signed)
Internal Medicine Clinic Attending  Case discussed with Dr. Guilloud at the time of the visit.  We reviewed the resident's history and exam and pertinent patient test results.  I agree with the assessment, diagnosis, and plan of care documented in the resident's note.  

## 2017-12-31 ENCOUNTER — Encounter: Payer: Self-pay | Admitting: Internal Medicine

## 2017-12-31 ENCOUNTER — Ambulatory Visit: Payer: Medicaid Other | Admitting: Internal Medicine

## 2017-12-31 ENCOUNTER — Other Ambulatory Visit: Payer: Self-pay

## 2017-12-31 VITALS — BP 121/85 | HR 81 | Temp 98.3°F | Ht 65.0 in | Wt 141.5 lb

## 2017-12-31 DIAGNOSIS — G8929 Other chronic pain: Secondary | ICD-10-CM

## 2017-12-31 DIAGNOSIS — R030 Elevated blood-pressure reading, without diagnosis of hypertension: Secondary | ICD-10-CM

## 2017-12-31 DIAGNOSIS — M170 Bilateral primary osteoarthritis of knee: Secondary | ICD-10-CM

## 2017-12-31 DIAGNOSIS — M25562 Pain in left knee: Secondary | ICD-10-CM

## 2017-12-31 DIAGNOSIS — M25561 Pain in right knee: Secondary | ICD-10-CM

## 2017-12-31 MED ORDER — NAPROXEN 500 MG PO TABS: 500.0000 mg | ORAL_TABLET | Freq: Two times a day (BID) | ORAL | 2 refills | Status: DC

## 2017-12-31 MED FILL — NAPROXEN 500 MG TABLET: 500 | 15 days supply | Qty: 30 | Fill #0

## 2017-12-31 NOTE — Assessment & Plan Note (Signed)
Patient is following up today due to prior episodes of elevated blood pressure readings. His blood pressure today is normal, 121/85. He has his home blood pressure log with him today. All readings are normal (<130/<90) aside from one reading with SBP in the 140s. Prior elevated readings are likely due to white coat hypertension. Will continue to monitor.

## 2017-12-31 NOTE — Progress Notes (Signed)
   CC: Knee pain  HPI:  [name] is a 57 y.o. male with past medical history outlined below here for knee pain. For the details of today's visit, please refer to the assessment and plan.   Past Medical History:  Diagnosis Date  . Allergic rhinitis   . Arthritis    Bilateral knee arthritis for about 20 years. Not taking any medications, believes in nature processes  . Cancer (HCC)    larynx  . Colon polyp   . Dental caries    prechemoradiation therapy, periodontitis  . GERD (gastroesophageal reflux disease)   . Headache   . History of radiation therapy 09/11/16- 10/30/16   Larynx 70 Gy in 35 fractions  . HLD (hyperlipidemia)   . Pneumonia    as a child    Review of Systems  Constitutional: Negative for fever.  Musculoskeletal: Positive for joint pain.    Physical Exam:  Vitals:   12/31/17 1318  BP: 121/85  Pulse: 81  Temp: 98.3 F (36.8 C)  TempSrc: Oral  SpO2: 97%  Weight: 141 lb 8 oz (64.2 kg)  Height: 5\' 5"  (1.651 m)    Constitutional: NAD, appears comfortable HEENT: PERRL, extraocular muscles intact, well healing laryngectomy stoma Cardiovascular: RRR, no murmurs, rubs, or gallops.  Pulmonary/Chest: CTAB, no wheezes, rales, or rhonchi.  Extremities: Warm and well perfused. No edema. Bony enlargement of bilateral knees. No ligament laxity. No tenderness to palpation of patellar or quadricepts tendon. No effusion. Neurological: A&Ox3, CN II - XII grossly intact.   Assessment & Plan:   See Encounters Tab for problem based charting.  Patient seen with Dr. Angelia Mould

## 2017-12-31 NOTE — Progress Notes (Signed)
Internal Medicine Clinic Attending  I saw and evaluated the patient.  I personally confirmed the key portions of the history and exam documented by Dr. Philipp Ovens and I reviewed pertinent patient test results.  The assessment, diagnosis, and plan were formulated together and I agree with the documentation in the resident's note. I was present for the both knee injections

## 2017-12-31 NOTE — Assessment & Plan Note (Signed)
Patient is here for follow up bilateral knee pain secondary to osteoarthritis, refractory to a two week course of high dose NSAIDs. He is agreeable to intraarticular steroid injections today.   Procedure Note  Indication:  Bilateral Knee Osteoarthritis   Operators: Drs Philipp Ovens & Heber [place]phalia   The patient was provided with risks, benefits, and alternatives to intraarticular injection. He consented to intraarticular knee injections for bilateral knee pain due to osteoarthritis.  After a time out was preformed, the knee was prepped in a sterile fashion. A mixture of 1 cc 1% lidocaine and 40 mg of Kenalog was injected into the knee using a 25 gauge needle. The needle was inserted at the lateral inferior patellar aspect of the knee while patient was in a seated position. Both knee spaces were entered without difficulty, entered successfully after 1 attempt. The patient tolerated the procedures well without complication.

## 2017-12-31 NOTE — Patient Instructions (Signed)
Jonathan [name],  It was a pleasure to see you! Today we injected both your knees with a steroid and pain numbing medicine. Please come back in three months to follow up. If you have any questions or concerns, call our clinic at [phone] or after hours call [phone] and ask for the internal medicine resident on call. Thank you!  Dr. Philipp Ovens

## 2018-02-08 DIAGNOSIS — C329 Malignant neoplasm of larynx, unspecified: Secondary | ICD-10-CM | POA: Diagnosis not present

## 2018-02-19 DIAGNOSIS — C329 Malignant neoplasm of larynx, unspecified: Secondary | ICD-10-CM | POA: Diagnosis not present

## 2018-02-21 ENCOUNTER — Telehealth: Payer: Self-pay | Admitting: *Deleted

## 2018-02-21 NOTE — Telephone Encounter (Signed)
Wife left a message stating Jonathan [name] would like to have his port removed.

## 2018-03-01 ENCOUNTER — Other Ambulatory Visit: Payer: Self-pay | Admitting: Hematology and Oncology

## 2018-03-01 ENCOUNTER — Telehealth: Payer: Self-pay | Admitting: *Deleted

## 2018-03-01 DIAGNOSIS — C321 Malignant neoplasm of supraglottis: Secondary | ICD-10-CM

## 2018-03-01 NOTE — Telephone Encounter (Signed)
Spoke with wife. Dr Alvy Bimler has placed an order for his port to be removed. Pt needs to get with PCP to have TSH checked. Scheduler will call to arrange an appt with Dr Alvy Bimler in 3 months

## 2018-03-01 NOTE — Telephone Encounter (Signed)
I placed order for port removal Remind him to get established with PCP and get TSH checked I will place scheduling msg to see him in 3 months

## 2018-03-11 ENCOUNTER — Other Ambulatory Visit: Payer: Self-pay

## 2018-03-11 ENCOUNTER — Encounter: Payer: Self-pay | Admitting: Internal Medicine

## 2018-03-11 ENCOUNTER — Ambulatory Visit: Payer: Medicaid Other | Admitting: Internal Medicine

## 2018-03-11 VITALS — BP 128/85 | HR 95 | Temp 97.9°F | Wt 146.3 lb

## 2018-03-11 DIAGNOSIS — M170 Bilateral primary osteoarthritis of knee: Secondary | ICD-10-CM | POA: Diagnosis not present

## 2018-03-11 DIAGNOSIS — M25562 Pain in left knee: Principal | ICD-10-CM

## 2018-03-11 DIAGNOSIS — C321 Malignant neoplasm of supraglottis: Secondary | ICD-10-CM

## 2018-03-11 DIAGNOSIS — Z8521 Personal history of malignant neoplasm of larynx: Secondary | ICD-10-CM | POA: Diagnosis not present

## 2018-03-11 DIAGNOSIS — Z9002 Acquired absence of larynx: Secondary | ICD-10-CM | POA: Diagnosis not present

## 2018-03-11 DIAGNOSIS — Z923 Personal history of irradiation: Secondary | ICD-10-CM | POA: Diagnosis not present

## 2018-03-11 DIAGNOSIS — J329 Chronic sinusitis, unspecified: Secondary | ICD-10-CM | POA: Diagnosis not present

## 2018-03-11 DIAGNOSIS — Z9221 Personal history of antineoplastic chemotherapy: Secondary | ICD-10-CM

## 2018-03-11 DIAGNOSIS — M25561 Pain in right knee: Principal | ICD-10-CM

## 2018-03-11 DIAGNOSIS — J019 Acute sinusitis, unspecified: Secondary | ICD-10-CM

## 2018-03-11 DIAGNOSIS — G8929 Other chronic pain: Secondary | ICD-10-CM

## 2018-03-11 MED ORDER — FLUTICASONE PROPIONATE 50 MCG/ACT NA SUSP: 2.0000 | Freq: Every day | NASAL | 2 refills | Status: DC

## 2018-03-11 MED FILL — FLUTICASONE PROP 50 MCG SPR: 50 | 30 days supply | Qty: 16 | Fill #0

## 2018-03-11 NOTE — Assessment & Plan Note (Addendum)
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 Patient completed chemotherapy (Cisplatin, Fluorouracil, and Docetaxel) and radiation therapy, unfortunately developed recurrence in April of 2019 now s/p laryngectomy. Doing well clinically. Follows closely with oncology & ENT. Currently seeing speech therapy at [facility].  -- TSH screen today

## 2018-03-11 NOTE — Progress Notes (Signed)
   CC: Knee pain  HPI:  Jonathan [name] is a 57 y.o. male with past medical history outlined below here for knee pain. For the details of today's visit, please refer to the assessment and plan.  Past Medical History:  Diagnosis Date  . Allergic rhinitis   . Arthritis    Bilateral knee arthritis for about 20 years. Not taking any medications, believes in nature processes  . Cancer (HCC)    larynx  . Colon polyp   . Dental caries    prechemoradiation therapy, periodontitis  . GERD (gastroesophageal reflux disease)   . Headache   . History of radiation therapy 09/11/16- 10/30/16   Larynx 70 Gy in 35 fractions  . HLD (hyperlipidemia)   . Pneumonia    as a child    Review of Systems  Constitutional: Negative for chills and fever.  Musculoskeletal: Positive for joint pain.       Bilateral knee pain    Physical Exam:  Vitals:   03/11/18 1329  BP: 128/85  Pulse: 95  Temp: 97.9 F (36.6 C)  TempSrc: Oral  SpO2: 94%  Weight: 146 lb 4.8 oz (66.4 kg)    Constitutional: NAD, appears comfortable HEENT: Well healed laryngectomy stoma  Cardiovascular: RRR, no murmurs, rubs, or gallops.  Pulmonary/Chest: CTAB, no wheezes, rales, or rhonchi.  Extremities: Bony enlargement of bilateral knees, no effusion.    Assessment & Plan:   See Encounters Tab for problem based charting.  Patient seen with Dr. Angelia Mould

## 2018-03-11 NOTE — Assessment & Plan Note (Signed)
Patient is here for follow up of his bilateral knee pain secondary to osteoarthritis. Underwent intraarticular steroid injections three months ago that provided significant relief. Pain recurred a couple of weeks ago and has been slowly getting worse. He would like to repeat injections today.    Procedure Note  Indication:  Bilateral Knee Osteoarthritis   Operators: Drs Philipp Ovens & Heber Garvin   The patient was provided with risks, benefits, and alternatives to intraarticular injection. He consented to intraarticular knee injections for bilateral knee pain due to osteoarthritis. After a time out was preformed, the knee was prepped in a sterile fashion. A mixture of 1 cc 1% lidocaine and 40 mg of Kenalog was injected into the knee using a 25 gauge needle. The needle was inserted at the lateral inferior patellar aspect of the knee while patient was in a seated position. Both knee spaces were entered without difficulty, entered successfully after 1 attempt. The patient tolerated the procedures well without complication.

## 2018-03-11 NOTE — Assessment & Plan Note (Signed)
Patient is complaining of persistent sinus congestion and drainage. No cough or fevers.  -- Flonase 2 sprays each nostril daily

## 2018-03-11 NOTE — Patient Instructions (Signed)
Jonathan [name],  It was a pleasure to see you! I am glad you are doing well. Today we injected both of your needs with a steroid medicine. You may follow up with me again in 6 months. We can repeat the injections again in 3 months if needed. I will call you with the results of your blood work today. If you have any questions or concerns, call our clinic at [phone] or after hours call [phone] and ask for the internal medicine resident on call. Thank you!  Dr. Philipp Ovens

## 2018-03-12 LAB — TSH: TSH: 116.9 u[IU]/mL — ABNORMAL HIGH (ref 0.450–4.500)

## 2018-03-13 NOTE — Progress Notes (Signed)
Internal Medicine Clinic Attending  I saw and evaluated the patient.  I personally confirmed the key portions of the history and exam documented by Dr. Philipp Ovens and I reviewed pertinent patient test results.  The assessment, diagnosis, and plan were formulated together and I agree with the documentation in the resident's note.   I was present for the procedure.

## 2018-03-13 NOTE — Addendum Note (Signed)
Addended by: Jodean Lima on: 03/13/2018 12:57 PM   Modules accepted: Orders

## 2018-03-18 ENCOUNTER — Encounter: Payer: Self-pay | Admitting: Internal Medicine

## 2018-03-21 MED FILL — NAPROXEN 500 MG TABLET: 500 | 15 days supply | Qty: 30 | Fill #1

## 2018-03-22 ENCOUNTER — Other Ambulatory Visit (INDEPENDENT_AMBULATORY_CARE_PROVIDER_SITE_OTHER): Payer: Medicaid Other

## 2018-03-22 DIAGNOSIS — E039 Hypothyroidism, unspecified: Secondary | ICD-10-CM | POA: Diagnosis not present

## 2018-03-22 DIAGNOSIS — C321 Malignant neoplasm of supraglottis: Secondary | ICD-10-CM | POA: Diagnosis not present

## 2018-03-23 LAB — T4, FREE: Free T4: 0.34 ng/dL — ABNORMAL LOW (ref 0.82–1.77)

## 2018-03-23 LAB — TSH: TSH: 74.7 u[IU]/mL — ABNORMAL HIGH (ref 0.450–4.500)

## 2018-04-01 DIAGNOSIS — E032 Hypothyroidism due to medicaments and other exogenous substances: Secondary | ICD-10-CM | POA: Insufficient documentation

## 2018-04-01 MED ORDER — LEVOTHYROXINE SODIUM 100 MCG PO TABS: 100.0000 ug | ORAL_TABLET | Freq: Every day | ORAL | 1 refills | Status: DC

## 2018-04-01 MED FILL — LEVOTHYROXINE 100 MCG TAB: 100 | 30 days supply | Qty: 30 | Fill #0

## 2018-04-01 NOTE — Progress Notes (Signed)
TSH persistently elevated on recheck, Free T4 low at 0.34. Sent prescription for synthroid 100 mcg daily to pharmacy. Left voicemail with results and instructions on how to take synthroid. Follow up 6 weeks for repeat labs.

## 2018-04-01 NOTE — Addendum Note (Signed)
Addended by: Jodean Lima on: 04/01/2018 01:13 PM   Modules accepted: Orders

## 2018-04-02 DIAGNOSIS — R490 Dysphonia: Secondary | ICD-10-CM | POA: Diagnosis not present

## 2018-04-15 ENCOUNTER — Other Ambulatory Visit: Payer: Self-pay | Admitting: Radiology

## 2018-04-16 ENCOUNTER — Encounter (HOSPITAL_COMMUNITY): Payer: Self-pay

## 2018-04-16 ENCOUNTER — Ambulatory Visit (HOSPITAL_COMMUNITY)
Admission: RE | Admit: 2018-04-16 | Discharge: 2018-04-16 | Disposition: A | Discharge status: Home / Self Care | Payer: Medicaid Other | Source: Ambulatory Visit | Attending: Hematology and Oncology | Admitting: Hematology and Oncology

## 2018-04-16 ENCOUNTER — Other Ambulatory Visit: Payer: Self-pay | Admitting: Hematology and Oncology

## 2018-04-16 DIAGNOSIS — K219 Gastro-esophageal reflux disease without esophagitis: Secondary | ICD-10-CM | POA: Insufficient documentation

## 2018-04-16 DIAGNOSIS — Z7951 Long term (current) use of inhaled steroids: Secondary | ICD-10-CM | POA: Diagnosis not present

## 2018-04-16 DIAGNOSIS — C321 Malignant neoplasm of supraglottis: Secondary | ICD-10-CM | POA: Diagnosis not present

## 2018-04-16 DIAGNOSIS — Z79890 Hormone replacement therapy (postmenopausal): Secondary | ICD-10-CM | POA: Insufficient documentation

## 2018-04-16 DIAGNOSIS — E785 Hyperlipidemia, unspecified: Secondary | ICD-10-CM | POA: Diagnosis not present

## 2018-04-16 DIAGNOSIS — Z452 Encounter for adjustment and management of vascular access device: Secondary | ICD-10-CM | POA: Insufficient documentation

## 2018-04-16 DIAGNOSIS — M170 Bilateral primary osteoarthritis of knee: Secondary | ICD-10-CM | POA: Diagnosis not present

## 2018-04-16 DIAGNOSIS — Z791 Long term (current) use of non-steroidal anti-inflammatories (NSAID): Secondary | ICD-10-CM | POA: Diagnosis not present

## 2018-04-16 DIAGNOSIS — Z5111 Encounter for antineoplastic chemotherapy: Secondary | ICD-10-CM | POA: Diagnosis not present

## 2018-04-16 HISTORY — PX: IR REMOVAL TUN ACCESS W/ PORT W/O FL MOD SED: IMG2290

## 2018-04-16 LAB — CBC WITH DIFFERENTIAL/PLATELET
Abs Immature Granulocytes: 0.06 10*3/uL (ref 0.00–0.07)
Basophils Absolute: 0.1 10*3/uL (ref 0.0–0.1)
Basophils Relative: 2 %
EOS ABS: 0.1 10*3/uL (ref 0.0–0.5)
Eosinophils Relative: 2 %
HCT: 41.1 % (ref 39.0–52.0)
Hemoglobin: 12.9 g/dL — ABNORMAL LOW (ref 13.0–17.0)
Immature Granulocytes: 2 %
Lymphocytes Relative: 18 %
Lymphs Abs: 0.7 10*3/uL (ref 0.7–4.0)
MCH: 30.2 pg (ref 26.0–34.0)
MCHC: 31.4 g/dL (ref 30.0–36.0)
MCV: 96.3 fL (ref 80.0–100.0)
Monocytes Absolute: 0.7 10*3/uL (ref 0.1–1.0)
Monocytes Relative: 17 %
Neutro Abs: 2.3 10*3/uL (ref 1.7–7.7)
Neutrophils Relative %: 59 %
Platelets: 223 10*3/uL (ref 150–400)
RBC: 4.27 MIL/uL (ref 4.22–5.81)
RDW: 18.4 % — AB (ref 11.5–15.5)
WBC: 3.9 10*3/uL — ABNORMAL LOW (ref 4.0–10.5)
nRBC: 0 % (ref 0.0–0.2)

## 2018-04-16 LAB — BASIC METABOLIC PANEL
Anion gap: 9 (ref 5–15)
BUN: 10 mg/dL (ref 6–20)
CALCIUM: 9 mg/dL (ref 8.9–10.3)
CO2: 26 mmol/L (ref 22–32)
Chloride: 108 mmol/L (ref 98–111)
Creatinine, Ser: 1.19 mg/dL (ref 0.61–1.24)
GFR calc Af Amer: >60 mL/min (ref >60)
Glucose, Bld: 89 mg/dL (ref 70–99)
Potassium: 4.2 mmol/L (ref 3.5–5.1)
Sodium: 143 mmol/L (ref 135–145)

## 2018-04-16 LAB — PROTIME-INR
INR: 0.96
Prothrombin Time: 12.6 seconds (ref 11.4–15.2)

## 2018-04-16 MED ORDER — CEFAZOLIN SODIUM-DEXTROSE 2-4 GM/100ML-% IV SOLN
2.0000 g | INTRAVENOUS | Status: AC
  Administered 2018-04-16: 2 g via INTRAVENOUS

## 2018-04-16 MED ORDER — LIDOCAINE HCL (PF) 1 % IJ SOLN
INTRAMUSCULAR | Status: AC | PRN
  Administered 2018-04-16: 10 mL via SUBCUTANEOUS

## 2018-04-16 MED ORDER — FENTANYL CITRATE (PF) 100 MCG/2ML IJ SOLN
INTRAMUSCULAR | Status: AC
  Filled 2018-04-16: qty 2

## 2018-04-16 MED ORDER — LIDOCAINE HCL 1 % IJ SOLN
INTRAMUSCULAR | Status: AC
  Filled 2018-04-16: qty 20

## 2018-04-16 MED ORDER — MIDAZOLAM HCL 2 MG/2ML IJ SOLN
INTRAMUSCULAR | Status: AC | PRN
  Administered 2018-04-16 (×3): 1 mg via INTRAVENOUS

## 2018-04-16 MED ORDER — MIDAZOLAM HCL 2 MG/2ML IJ SOLN
INTRAMUSCULAR | Status: AC
  Filled 2018-04-16: qty 4

## 2018-04-16 MED ORDER — FENTANYL CITRATE (PF) 100 MCG/2ML IJ SOLN
INTRAMUSCULAR | Status: AC | PRN
  Administered 2018-04-16 (×2): 25 ug via INTRAVENOUS
  Administered 2018-04-16: 50 ug via INTRAVENOUS

## 2018-04-16 MED ORDER — SODIUM CHLORIDE 0.9 % IV SOLN
INTRAVENOUS | Status: DC
  Administered 2018-04-16: 09:00:00 via INTRAVENOUS

## 2018-04-16 MED ORDER — CEFAZOLIN SODIUM-DEXTROSE 2-4 GM/100ML-% IV SOLN
INTRAVENOUS | Status: AC
  Administered 2018-04-16: 2 g via INTRAVENOUS
  Filled 2018-04-16: qty 100

## 2018-04-16 NOTE — Discharge Instructions (Signed)
Implanted Port Removal, Care After  This sheet gives you information about how to care for yourself after your procedure. Your health care provider may also give you more specific instructions. If you have problems or questions, contact your health care provider.  What can I expect after the procedure?  After the procedure, it is common to have:   Soreness or pain near your incision.   Some swelling or bruising near your incision.  Follow these instructions at home:  Medicines   Take over-the-counter and prescription medicines only as told by your health care provider.   If you were prescribed an antibiotic medicine, take it as told by your health care provider. Do not stop taking the antibiotic even if you start to feel better.  Bathing   Do not take baths, swim, or use a hot tub until your health care provider approves. Ask your health care provider if you can take showers. You may only be allowed to take sponge baths.  Incision care     Follow instructions from your health care provider about how to take care of your incision. Make sure you:  ? Wash your hands with soap and water before you change your bandage (dressing). If soap and water are not available, use hand sanitizer.  ? Change your dressing as told by your health care provider.  ? Keep your dressing dry.  ? Leave stitches (sutures), skin glue, or adhesive strips in place. These skin closures may need to stay in place for 2 weeks or longer. If adhesive strip edges start to loosen and curl up, you may trim the loose edges. Do not remove adhesive strips completely unless your health care provider tells you to do that.   Check your incision area every day for signs of infection. Check for:  ? More redness, swelling, or pain.  ? More fluid or blood.  ? Warmth.  ? Pus or a bad smell.  Driving     Do not drive for 24 hours if you were given a medicine to help you relax (sedative) during your procedure.   If you did not receive a sedative, ask  your health care provider when it is safe to drive.  Activity   Return to your normal activities as told by your health care provider. Ask your health care provider what activities are safe for you.   Do not lift anything that is heavier than 10 lb (4.5 kg), or the limit that you are told, until your health care provider says that it is safe.   Do not do activities that involve lifting your arms over your head.  General instructions   Do not use any products that contain nicotine or tobacco, such as cigarettes and e-cigarettes. These can delay healing. If you need help quitting, ask your health care provider.   Keep all follow-up visits as told by your health care provider. This is important.  Contact a health care provider if:   You have more redness, swelling, or pain around your incision.   You have more fluid or blood coming from your incision.   Your incision feels warm to the touch.   You have pus or a bad smell coming from your incision.   You have pain that is not relieved by your pain medicine.  Get help right away if you have:   A fever or chills.   Chest pain.   Difficulty breathing.  Summary   After the procedure, it is common to   have pain, soreness, swelling, or bruising near your incision.   If you were prescribed an antibiotic medicine, take it as told by your health care provider. Do not stop taking the antibiotic even if you start to feel better.   Do not drive for 24 hours if you were given a sedative during your procedure.   Return to your normal activities as told by your health care provider. Ask your health care provider what activities are safe for you.  This information is not intended to replace advice given to you by your health care provider. Make sure you discuss any questions you have with your health care provider.  Document Released: 03/01/2015 Document Revised: 05/03/2017 Document Reviewed: 05/03/2017  Elsevier Interactive Patient Education  2019 [employer] Inc[employer]

## 2018-04-16 NOTE — H&P (Signed)
Referring Physician(s): Heath Lark  Supervising Physician: Aletta Edouard  Patient Status:  Jonathan [name] OP  Chief Complaint:  "I'm getting my port out"  Subjective: Patient familiar to IR service from prior Port-A-Cath placement on 06/02/2016.  He has a history of supraglottic cancer and has completed treatment. He no longer uses his Port-A-Cath and presents today for port removal.  He currently denies fever, headache, chest pain, dyspnea, cough, abdominal/back pain, nausea, vomiting or bleeding.  Past Medical History:  Diagnosis Date  . Allergic rhinitis   . Arthritis    Bilateral knee arthritis for about 20 years. Not taking any medications, believes in nature processes  . Cancer (HCC)    larynx  . Colon polyp   . Dental caries    prechemoradiation therapy, periodontitis  . GERD (gastroesophageal reflux disease)   . Headache   . History of radiation therapy 09/11/16- 10/30/16   Larynx 70 Gy in 35 fractions  . HLD (hyperlipidemia)   . Pneumonia    as a child   Past Surgical History:  Procedure Laterality Date  . COLONOSCOPY  03/18/2012  . DIRECT LARYNGOSCOPY N/A 07/06/2017   Procedure: DIRECT LARYNGOSCOPY;  Surgeon: Izora Gala, MD;  Location: [place];  Service: ENT;  Laterality: N/A;  . ESOPHAGOSCOPY  04/24/2016   Procedure: ESOPHAGOSCOPY;  Surgeon: Izora Gala, MD;  Location: [place];  Service: ENT;;  . IR GENERIC HISTORICAL  06/02/2016   IR US GUIDE VASC ACCESS RIGHT 06/02/2016 Sandi Mariscal, MD [facility]INTER[facility]  . IR GENERIC HISTORICAL  06/02/2016   IR FLUORO GUIDE PORT INSERTION RIGHT 06/02/2016 Sandi Mariscal, MD [facility]  . LARYNGETOMY N/A 08/10/2017   Procedure: TOTAL LARYNGECTOMY;  Surgeon: Izora Gala, MD;  Location: [place];  Service: ENT;  Laterality: N/A;  . LARYNGOSCOPY  04/24/2016   Procedure: LARYNGOSCOPY;  Surgeon: Izora Gala, MD;  Location: [place];  Service: ENT;;  . MULTIPLE EXTRACTIONS WITH ALVEOLOPLASTY N/A 05/26/2016   Procedure: Extraction of tooth #'s 2,5,6,8-13, 21-28  with alveoloplasty, bilateral mandibular tori reductions and buccal exostoses reductions of maxillary left, maxillary right, and mandibular left quadrants.;  Surgeon: Lenn Cal, DDS;  Location: [place];  Service: Oral Surgery;  Laterality: N/A;  . TRACHEOSTOMY TUBE PLACEMENT N/A 04/24/2016   Procedure: TRACHEOSTOMY;  Surgeon: Izora Gala, MD;  Location: [place];  Service: ENT;  Laterality: N/A;      Allergies: Patient has no known allergies.  Medications: Prior to Admission medications   Medication Sig Start Date End Date Taking? Authorizing Provider  fluticasone [facility] ALLERGY RELIEF) 50 MCG/ACT nasal spray Place 2 sprays into both nostrils daily. 03/11/18  Yes Velna Ochs, MD  levothyroxine (SYNTHROID) 100 MCG tablet Take 1 tablet (100 mcg total) by mouth daily. 04/01/18 04/01/19 Yes Velna Ochs, MD  naproxen (NAPROSYN) 500 MG tablet Take 1 tablet (500 mg total) by mouth 2 (two) times daily with a meal. 12/31/17 12/31/18 Yes Velna Ochs, MD  SUPREP BOWEL PREP KIT 17.5-3.13-1.6 GM/177ML SOLN Take 1 kit by mouth as directed. Patient not taking: Reported on 08/02/2017 05/14/17   Jerene Bears, MD     Vital Signs: BP (!) 168/115 (BP Location: Right Arm)   Pulse 95   Temp 98.4 F (36.9 C) (Oral)   Resp 18   SpO2 99%   Physical Exam awake, alert.  Chest clear to auscultation bilaterally.  Clean, intact right chest wall Port-A-Cath.  Heart with regular rate and rhythm.  Abdomen soft, positive bowel sounds, nontender.  No lower extremity  edema.  Trach site clean and dry  Imaging: No results found.  Labs:  CBC: Recent Labs    07/04/17 1159 08/08/17 1341 08/11/17 0523  WBC 4.2 4.9 12.2*  HGB 14.8 13.8 13.3  HCT 45.2 42.3 40.8  PLT 248 228 222    COAGS: No results for input(s): INR, APTT in the last 8760 hours.  BMP: Recent Labs    08/08/17 1341 08/11/17 0523  NA 137 136  K 4.1 4.0  CL 99* 95*  CO2 26 31  GLUCOSE 83 122*  BUN 9 8  CALCIUM 9.5 9.0    CREATININE 0.93 0.97  GFRNONAA >60 >60  GFRAA >60 >60    LIVER FUNCTION TESTS: No results for input(s): BILITOT, AST, ALT, ALKPHOS, PROT, ALBUMIN in the last 8760 hours.  Assessment and Plan: Pt with history of supraglottic cancer and has completed treatment. He no longer uses his Port-A-Cath and presents today for port removal. Details/risks of procedure, including but not limited to, internal bleeding, infection, injury to adjacent structures discussed with patient and spouse with their understanding and consent.   Electronically Signed: D. Rowe Robert, PA-C 04/16/2018, 8:47 AM   I spent a total of 20 minutes at the the patient's bedside AND on the patient's hospital floor or unit, greater than 50% of which was counseling/coordinating care for Port-A-Cath removal

## 2018-04-16 NOTE — Procedures (Signed)
Interventional Radiology Procedure Note  Procedure: Port removal  Complications: None  Estimated Blood Loss: < 10 mL  Findings: Right chest port removed completely. Wound closed.  Venetia Night. Kathlene Cote, M.D Pager:  [phone]

## 2018-05-14 DIAGNOSIS — R490 Dysphonia: Secondary | ICD-10-CM | POA: Diagnosis not present

## 2018-05-20 ENCOUNTER — Other Ambulatory Visit: Payer: Self-pay

## 2018-05-20 ENCOUNTER — Encounter: Payer: Self-pay | Admitting: Internal Medicine

## 2018-05-20 ENCOUNTER — Ambulatory Visit: Payer: Medicaid Other | Admitting: Internal Medicine

## 2018-05-20 VITALS — BP 145/97 | HR 85 | Temp 98.8°F | Ht 65.0 in | Wt 144.0 lb

## 2018-05-20 DIAGNOSIS — E890 Postprocedural hypothyroidism: Secondary | ICD-10-CM

## 2018-05-20 DIAGNOSIS — E039 Hypothyroidism, unspecified: Secondary | ICD-10-CM

## 2018-05-20 DIAGNOSIS — Z79890 Hormone replacement therapy (postmenopausal): Secondary | ICD-10-CM

## 2018-05-20 NOTE — Patient Instructions (Signed)
Mr. Dondero,  It was a pleasure to see you! I am glad you are doing so well. Please continue to take your medicine as previously prescribed. I will call you with the results of your blood work tomorrow.   If you have any questions or concerns, call our clinic at [phone] or after hours call [phone] and ask for the internal medicine resident on call. Thank you!  Dr. Philipp Ovens

## 2018-05-20 NOTE — Progress Notes (Signed)
   CC: Hypothyroidism follow up  HPI:  [name] is a 58 y.o. male with past medical history outlined below here for hypothyroidism follow up. For the details of today's visit, please refer to the assessment and plan.  Past Medical History:  Diagnosis Date  . Allergic rhinitis   . Arthritis    Bilateral knee arthritis for about 20 years. Not taking any medications, believes in nature processes  . Cancer (HCC)    larynx  . Colon polyp   . Dental caries    prechemoradiation therapy, periodontitis  . GERD (gastroesophageal reflux disease)   . Headache   . History of radiation therapy 09/11/16- 10/30/16   Larynx 70 Gy in 35 fractions  . HLD (hyperlipidemia)   . Pneumonia    as a child    ROS  Physical Exam:  Vitals:   05/20/18 1329  BP: (!) 144/92  Pulse: 87  Temp: 98.8 F (37.1 C)  TempSrc: Oral  SpO2: 99%  Weight: 144 lb (65.3 kg)  Height: 5\' 5"  (1.651 m)    Constitutional: NAD, appears comfortable HEENT: Stoma with tracheostomy plug, no erythema or drainage  Cardiovascular: RRR, no m/r/g Pulmonary/Chest: CTAB, no wheezes, rales, or rhonchi.  Extremities: Warm and well perfused. No edema.  Psychiatric: Normal mood and affect  Assessment & Plan:   See Encounters Tab for problem based charting.  Patient discussed with Dr. Angelia Mould

## 2018-05-21 ENCOUNTER — Other Ambulatory Visit: Payer: Self-pay

## 2018-05-21 ENCOUNTER — Encounter: Payer: Self-pay | Admitting: Internal Medicine

## 2018-05-21 LAB — TSH: TSH: 101.8 u[IU]/mL — ABNORMAL HIGH (ref 0.450–4.500)

## 2018-05-21 MED ORDER — LEVOTHYROXINE SODIUM 125 MCG PO TABS: 125.0000 ug | ORAL_TABLET | Freq: Every day | ORAL | 1 refills | Status: DC

## 2018-05-21 MED FILL — LEVOTHYROXINE 125 MCG TABLE: 125 | 30 days supply | Qty: 30 | Fill #0

## 2018-05-21 NOTE — Progress Notes (Signed)
Internal Medicine Clinic Attending  Case discussed with Dr. Guilloud at the time of the visit.  We reviewed the resident's history and exam and pertinent patient test results.  I agree with the assessment, diagnosis, and plan of care documented in the resident's note.  

## 2018-05-21 NOTE — Telephone Encounter (Signed)
Returned call to pharmacy-Dr Philipp Ovens 's NPI # not working for [employer] given NPI to attending MD Evette Doffing).  No further action needed, phone call complete.Despina Hidden Cassady2/18/20209:56 AM

## 2018-05-21 NOTE — Telephone Encounter (Signed)
Needs to speak with a nurse about levothyroxine (SYNTHROID, LEVOTHROID) 125 MCG tablet.

## 2018-05-21 NOTE — Assessment & Plan Note (Addendum)
Patient is here for follow-up of radiation-induced hypothyroidism.  He was started on Synthroid 2 months ago.  Since then he has lost a couple of pounds.  He is disappointed as he has been actively trying to gain weight.  Otherwise he denies side effects and is tolerating medication well.  We will recheck TSH today and titrate Synthroid dose accordingly. --Follow-up TSH  ADDENDUM: TSH persistently elevated, 101. Called patient, left voicemail with results and plan. Will increase synthroid to 125 mcg daily. Sent prescription to [place]. Return 4-6 weeks for TSH recheck. PCP appointment or lab only appointment is fine.

## 2018-05-28 ENCOUNTER — Ambulatory Visit: Payer: Self-pay

## 2018-05-31 ENCOUNTER — Telehealth: Payer: Self-pay | Admitting: Hematology and Oncology

## 2018-05-31 DIAGNOSIS — C329 Malignant neoplasm of larynx, unspecified: Secondary | ICD-10-CM | POA: Diagnosis not present

## 2018-05-31 DIAGNOSIS — R490 Dysphonia: Secondary | ICD-10-CM | POA: Diagnosis not present

## 2018-05-31 DIAGNOSIS — Z87891 Personal history of nicotine dependence: Secondary | ICD-10-CM | POA: Diagnosis not present

## 2018-05-31 DIAGNOSIS — Z923 Personal history of irradiation: Secondary | ICD-10-CM | POA: Diagnosis not present

## 2018-05-31 NOTE — Telephone Encounter (Signed)
Faxed medical records to [facility] and [facility], Release [id number]

## 2018-06-03 ENCOUNTER — Telehealth: Payer: Self-pay | Admitting: *Deleted

## 2018-06-03 NOTE — Telephone Encounter (Signed)
On 06-03-18 fax medical records to [facility] neck cancer [facility], it was consult note, end of tx note, sim and planning note, follow up note,

## 2018-06-04 ENCOUNTER — Encounter (HOSPITAL_COMMUNITY): Payer: Self-pay | Admitting: *Deleted

## 2018-06-04 ENCOUNTER — Other Ambulatory Visit: Payer: Self-pay

## 2018-06-04 NOTE — Progress Notes (Signed)
Spoke with pt and his wife, [name] for pre-op call. Pt uses a voice device to speak with. Pt denies cardiac history, HTN or Diabetes. Pt has had cancer of the larynx.

## 2018-06-04 NOTE — Anesthesia Preprocedure Evaluation (Addendum)
Anesthesia Evaluation  Patient identified by MRN, date of birth, ID band Patient awake    Reviewed: Allergy & Precautions, NPO status , Patient's Chart, lab work & pertinent test results  Airway Mallampati: Trach   Neck ROM: Full    Dental   Pulmonary former smoker,    Pulmonary exam normal        Cardiovascular Normal cardiovascular exam     Neuro/Psych    GI/Hepatic GERD  Medicated and Controlled,  Endo/Other  Hypothyroidism   Renal/GU      Musculoskeletal   Abdominal   Peds  Hematology   Anesthesia Other Findings   Reproductive/Obstetrics                            Anesthesia Physical Anesthesia Plan  ASA: III  Anesthesia Plan: General   Post-op Pain Management:    Induction: Intravenous  PONV Risk Score and Plan: 2 and Ondansetron, Treatment may vary due to age or medical condition and Midazolam  Airway Management Planned: Tracheostomy  Additional Equipment:   Intra-op Plan:   Post-operative Plan: Extubation in OR  Informed Consent: I have reviewed the patients History and Physical, chart, labs and discussed the procedure including the risks, benefits and alternatives for the proposed anesthesia with the patient or authorized representative who has indicated his/her understanding and acceptance.       Plan Discussed with: CRNA and Surgeon  Anesthesia Plan Comments: (PAT note written 06/04/2018 by Myra Gianotti, PA-C. )       Anesthesia Quick Evaluation

## 2018-06-04 NOTE — Progress Notes (Signed)
Anesthesia Chart Review: SAME DAY WORK-UP   Case:  [id number] Date/Time:  06/05/18 0900   Procedure:  Tracheal STOMAPLASTY (N/A )   Anesthesia type:  General   Pre-op diagnosis:  Laryngeal cancer   Location:  [facility] ROOM 09 / [place] [facility]   Surgeon:  Izora Gala, MD      DISCUSSION: Patient is a 58 year old male scheduled for the above procedure.  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  According to ENT note, patient has been working with ST and has had great progress with electrolarynex, but is very interested in TEP (transesophageal voice prosthesis) which will require stoma plasty first.  History includes laryngeal cancer (s/p chemotherapy 06/09/16-08/22/16; s/p radiation 09/11/16-10/30/16;s/p tracheostomy 04/24/16-05/04/17; s/p total laryngectomy 08/10/17 for recurrence), hyperlipidemia, radiation induced hypothyroidism, GERD, former smoker (quit 2017).  He was stated on treated for hypothyroidism, believed to be radiation-induced, in 03/2018 (TSH 116.90 03/11/18). He had follow-up on 05/30/18 with TSH 101.800. Levothyroxine was increased to 125 mcg daily with plans to recheck TSH in 4-6 weeks.   Anesthesiologist Annye Asa, MD updated regarding hypothyroidism and recent TSH results. Patient is on an increased levothyroxine dose now. He will get routine preoperative labs on arrival and anesthesiologist evaluation. If labs acceptable and no acute findings then it is anticipated that he can proceed as planned.   VS: He is for vitals on arrival. According to 05/20/18 PCP visit, BP 145/97, HR 85, T 37.1 C, O2 sat 99%. HT _0  (1.651 m), WT 65.3 kg. BMI 23.96 kg/m2. BMS 1.72 m2.    PROVIDERSVelna Ochs, MD is PCP  Heath Lark, MD is HEM-ONC   LABS: He is for updated labs on arrival. Comparison labs from 04/16/18 showed normal BMET, H/H 12.9/41.1, PLT 223.    EKG: 07/04/17: Normal sinus rhythm Nonspecific T wave abnormality Abnormal ECG Nonspecific ST elevation  V1 and V2; clinical correlation and fu tracing recommended No previous tracing Confirmed by Kirk Ruths [phone]) on 07/04/2017 12:57:26 PM   CV: N/A   Past Medical History:  Diagnosis Date  . Allergic rhinitis   . Arthritis    Bilateral knee arthritis for about 20 years. Not taking any medications, believes in nature processes  . Cancer (HCC)    larynx  . Colon polyp   . Dental caries    prechemoradiation therapy, periodontitis  . GERD (gastroesophageal reflux disease)   . Headache   . History of radiation therapy 09/11/16- 10/30/16   Larynx 70 Gy in 35 fractions  . HLD (hyperlipidemia)   . Hypothyroidism    radiation induced hypothyroidism  . Pneumonia    as a child    Past Surgical History:  Procedure Laterality Date  . COLONOSCOPY  03/18/2012  . DIRECT LARYNGOSCOPY N/A 07/06/2017   Procedure: DIRECT LARYNGOSCOPY;  Surgeon: Izora Gala, MD;  Location: [facility];  Service: ENT;  Laterality: N/A;  . ESOPHAGOSCOPY  04/24/2016   Procedure: ESOPHAGOSCOPY;  Surgeon: Izora Gala, MD;  Location: [facility];  Service: ENT;;  . IR GENERIC HISTORICAL  06/02/2016   IR US GUIDE VASC ACCESS RIGHT 06/02/2016 Sandi Mariscal, MD [facility]  . IR GENERIC HISTORICAL  06/02/2016   IR FLUORO GUIDE PORT INSERTION RIGHT 06/02/2016 Sandi Mariscal, MD [facility]  . IR REMOVAL TUN ACCESS W/ PORT W/O FL MOD SED  04/16/2018  . LARYNGETOMY N/A 08/10/2017   Procedure: TOTAL LARYNGECTOMY;  Surgeon: Izora Gala, MD;  Location: [facility];  Service: ENT;  Laterality: N/A;  .  LARYNGOSCOPY  04/24/2016   Procedure: LARYNGOSCOPY;  Surgeon: Izora Gala, MD;  Location: [facility];  Service: ENT;;  . MULTIPLE EXTRACTIONS WITH ALVEOLOPLASTY N/A 05/26/2016   Procedure: Extraction of tooth #'s 2,5,6,8-13, 21-28 with alveoloplasty, bilateral mandibular tori reductions and buccal exostoses reductions of maxillary left, maxillary right, and mandibular left quadrants.;  Surgeon: Lenn Cal, DDS;  Location: [facility];  Service: Oral Surgery;   Laterality: N/A;  . TRACHEOSTOMY TUBE PLACEMENT N/A 04/24/2016   Procedure: TRACHEOSTOMY;  Surgeon: Izora Gala, MD;  Location: [facility];  Service: ENT;  Laterality: N/A;    MEDICATIONS: No current facility-administered medications for this encounter.    . levothyroxine (SYNTHROID, LEVOTHROID) 125 MCG tablet  . naproxen (NAPROSYN) 500 MG tablet  . fluticasone (FLONASE ALLERGY RELIEF) 50 MCG/ACT nasal spray  . SUPREP BOWEL PREP KIT 17.5-3.13-1.6 GM/177ML SOLN    Myra Gianotti, PA-C Surgical Short Stay/Anesthesiology [facility] Phone [phone] [facility] Phone [phone] 06/04/2018 4:40 PM

## 2018-06-04 NOTE — H&P (Signed)
Jonathan [name] is an 58 y.o. male.   Chief Complaint: Alaryngeal speech HPI: History of salvage laryngectomy.  Interested in TEP but is going to need stomal plasty prior to TEP placement.  Past Medical History:  Diagnosis Date  . Allergic rhinitis   . Arthritis    Bilateral knee arthritis for about 20 years. Not taking any medications, believes in nature processes  . Cancer (HCC)    larynx  . Colon polyp   . Dental caries    prechemoradiation therapy, periodontitis  . GERD (gastroesophageal reflux disease)   . Headache   . History of radiation therapy 09/11/16- 10/30/16   Larynx 70 Gy in 35 fractions  . HLD (hyperlipidemia)   . Pneumonia    as a child    Past Surgical History:  Procedure Laterality Date  . COLONOSCOPY  03/18/2012  . DIRECT LARYNGOSCOPY N/A 07/06/2017   Procedure: DIRECT LARYNGOSCOPY;  Surgeon: Izora Gala, MD;  Location: [place];  Service: ENT;  Laterality: N/A;  . ESOPHAGOSCOPY  04/24/2016   Procedure: ESOPHAGOSCOPY;  Surgeon: Izora Gala, MD;  Location: [facility];  Service: ENT;;  . IR GENERIC HISTORICAL  06/02/2016   IR US GUIDE VASC ACCESS RIGHT 06/02/2016 Sandi Mariscal, MD [facility]  . IR GENERIC HISTORICAL  06/02/2016   IR FLUORO GUIDE PORT INSERTION RIGHT 06/02/2016 Sandi Mariscal, MD [facility]  . IR REMOVAL TUN ACCESS W/ PORT W/O FL MOD SED  04/16/2018  . LARYNGETOMY N/A 08/10/2017   Procedure: TOTAL LARYNGECTOMY;  Surgeon: Izora Gala, MD;  Location: [place];  Service: ENT;  Laterality: N/A;  . LARYNGOSCOPY  04/24/2016   Procedure: LARYNGOSCOPY;  Surgeon: Izora Gala, MD;  Location: [place];  Service: ENT;;  . MULTIPLE EXTRACTIONS WITH ALVEOLOPLASTY N/A 05/26/2016   Procedure: Extraction of tooth #'s 2,5,6,8-13, 21-28 with alveoloplasty, bilateral mandibular tori reductions and buccal exostoses reductions of maxillary left, maxillary right, and mandibular left quadrants.;  Surgeon: Lenn Cal, DDS;  Location: [place];  Service: Oral Surgery;  Laterality: N/A;  .  TRACHEOSTOMY TUBE PLACEMENT N/A 04/24/2016   Procedure: TRACHEOSTOMY;  Surgeon: Izora Gala, MD;  Location: [facility];  Service: ENT;  Laterality: N/A;    Family History  Problem Relation Age of Onset  . Prostate cancer Maternal Uncle 66       On radiation therapy now, diagnosed in around June 2012  . Colon cancer Maternal Uncle   . Heart attack Paternal Grandfather        Diet due to MI  . Lung cancer Paternal Aunt   . Brain cancer Paternal Aunt   . Kidney disease Maternal Uncle   . Hypertension Mother   . CAD Father   . Colon polyps Neg Hx   . Diabetes Neg Hx   . Gallbladder disease Neg Hx   . Esophageal cancer Neg Hx    Social History:  reports that he quit smoking about 2 years ago. His smoking use included cigarettes. He has a 15.00 pack-year smoking history. He has never used smokeless tobacco. He reports that he does not drink alcohol or use drugs.  Allergies: No Known Allergies  No medications prior to admission.    No results found for this or any previous visit (from the past 48 hour(s)). No results found.  ROS: otherwise negative  There were no vitals taken for this visit.  PHYSICAL EXAM: Overall appearance:  Healthy appearing, in no distress Head:  Normocephalic, atraumatic. Ears: External auditory canals are clear; tympanic membranes  are intact and the middle ears are free of any effusion. Nose: External nose is healthy in appearance. Internal nasal exam free of any lesions or obstruction. Oral Cavity/pharynx:  There are no mucosal lesions or masses identified. Neuro:  No identifiable neurologic deficits. Neck: Tracheal stoma is clean and intact but slightly stenotic.  No palpable neck masses.  Studies Reviewed: none    Assessment/Plan Proceed with revision of tracheal stoma.  Izora Gala 06/04/2018, 10:33 AM

## 2018-06-05 ENCOUNTER — Other Ambulatory Visit: Payer: Self-pay

## 2018-06-05 ENCOUNTER — Encounter (HOSPITAL_COMMUNITY)
Admission: RE | Disposition: A | Discharge status: Home / Self Care | Payer: Self-pay | Source: Home / Self Care | Attending: Otolaryngology

## 2018-06-05 ENCOUNTER — Inpatient Hospital Stay (HOSPITAL_COMMUNITY): Payer: Medicaid Other | Admitting: Vascular Surgery

## 2018-06-05 ENCOUNTER — Encounter (HOSPITAL_COMMUNITY): Payer: Self-pay | Admitting: Registered Nurse

## 2018-06-05 ENCOUNTER — Observation Stay (HOSPITAL_COMMUNITY)
Admission: RE | Admit: 2018-06-05 | Discharge: 2018-06-06 | Disposition: A | Discharge status: Home / Self Care | Payer: Medicaid Other | Attending: Otolaryngology | Admitting: Otolaryngology

## 2018-06-05 DIAGNOSIS — Z87891 Personal history of nicotine dependence: Secondary | ICD-10-CM | POA: Diagnosis not present

## 2018-06-05 DIAGNOSIS — Z9002 Acquired absence of larynx: Secondary | ICD-10-CM | POA: Diagnosis not present

## 2018-06-05 DIAGNOSIS — Z9221 Personal history of antineoplastic chemotherapy: Secondary | ICD-10-CM | POA: Diagnosis not present

## 2018-06-05 DIAGNOSIS — I10 Essential (primary) hypertension: Secondary | ICD-10-CM | POA: Diagnosis not present

## 2018-06-05 DIAGNOSIS — J9503 Malfunction of tracheostomy stoma: Principal | ICD-10-CM | POA: Insufficient documentation

## 2018-06-05 DIAGNOSIS — Z791 Long term (current) use of non-steroidal anti-inflammatories (NSAID): Secondary | ICD-10-CM | POA: Insufficient documentation

## 2018-06-05 DIAGNOSIS — E039 Hypothyroidism, unspecified: Secondary | ICD-10-CM | POA: Diagnosis not present

## 2018-06-05 DIAGNOSIS — Z7951 Long term (current) use of inhaled steroids: Secondary | ICD-10-CM | POA: Diagnosis not present

## 2018-06-05 DIAGNOSIS — Z8521 Personal history of malignant neoplasm of larynx: Secondary | ICD-10-CM | POA: Diagnosis not present

## 2018-06-05 DIAGNOSIS — Z923 Personal history of irradiation: Secondary | ICD-10-CM | POA: Insufficient documentation

## 2018-06-05 DIAGNOSIS — C329 Malignant neoplasm of larynx, unspecified: Secondary | ICD-10-CM | POA: Diagnosis not present

## 2018-06-05 DIAGNOSIS — J398 Other specified diseases of upper respiratory tract: Secondary | ICD-10-CM | POA: Diagnosis present

## 2018-06-05 DIAGNOSIS — R490 Dysphonia: Secondary | ICD-10-CM | POA: Diagnosis not present

## 2018-06-05 DIAGNOSIS — D61818 Other pancytopenia: Secondary | ICD-10-CM | POA: Diagnosis not present

## 2018-06-05 DIAGNOSIS — Z79890 Hormone replacement therapy (postmenopausal): Secondary | ICD-10-CM | POA: Diagnosis not present

## 2018-06-05 HISTORY — DX: Hypothyroidism, unspecified: E03.9

## 2018-06-05 HISTORY — PX: STOMAPLASTY: SHX5219

## 2018-06-05 LAB — HEMOGLOBIN: Hemoglobin: 12.7 g/dL — ABNORMAL LOW (ref 13.0–17.0)

## 2018-06-05 SURGERY — STOMAPLASTY
Anesthesia: General | Site: Neck

## 2018-06-05 MED ORDER — LABETALOL HCL 5 MG/ML IV SOLN
INTRAVENOUS | Status: DC | PRN
  Administered 2018-06-05: 5 mg via INTRAVENOUS

## 2018-06-05 MED ORDER — NAPROXEN 250 MG PO TABS
500.0000 mg | ORAL_TABLET | Freq: Two times a day (BID) | ORAL | Status: DC | PRN
  Administered 2018-06-05 – 2018-06-06 (×2): 500 mg via ORAL
  Filled 2018-06-05 (×2): qty 2

## 2018-06-05 MED ORDER — ONDANSETRON HCL 4 MG/2ML IJ SOLN: 4.0000 mg | Freq: Once | INTRAMUSCULAR | Status: DC | PRN

## 2018-06-05 MED ORDER — LIDOCAINE-EPINEPHRINE 1 %-1:100000 IJ SOLN
INTRAMUSCULAR | Status: AC
  Filled 2018-06-05: qty 1

## 2018-06-05 MED ORDER — STERILE WATER FOR INJECTION IJ SOLN
INTRAMUSCULAR | Status: DC | PRN
  Administered 2018-06-05: 1000 mL

## 2018-06-05 MED ORDER — ACETAMINOPHEN 160 MG/5ML PO SOLN
650.0000 mg | ORAL | Status: DC | PRN
  Administered 2018-06-05 – 2018-06-06 (×2): 650 mg via ORAL
  Filled 2018-06-05 (×2): qty 20.3

## 2018-06-05 MED ORDER — LIDOCAINE 2% (20 MG/ML) 5 ML SYRINGE
INTRAMUSCULAR | Status: DC | PRN
  Administered 2018-06-05 (×2): 100 mg via INTRAVENOUS

## 2018-06-05 MED ORDER — BACITRACIN ZINC 500 UNIT/GM EX OINT
TOPICAL_OINTMENT | CUTANEOUS | Status: AC
  Filled 2018-06-05: qty 28.35

## 2018-06-05 MED ORDER — ONDANSETRON HCL 4 MG/2ML IJ SOLN
INTRAMUSCULAR | Status: DC | PRN
  Administered 2018-06-05: 4 mg via INTRAVENOUS

## 2018-06-05 MED ORDER — HYDROMORPHONE HCL 1 MG/ML IJ SOLN: 0.2500 mg | INTRAMUSCULAR | Status: DC | PRN

## 2018-06-05 MED ORDER — CEFAZOLIN SODIUM 1 G IJ SOLR
INTRAMUSCULAR | Status: AC
  Filled 2018-06-05: qty 20

## 2018-06-05 MED ORDER — ROCURONIUM BROMIDE 10 MG/ML (PF) SYRINGE
PREFILLED_SYRINGE | INTRAVENOUS | Status: DC | PRN
  Administered 2018-06-05: 65 mg via INTRAVENOUS

## 2018-06-05 MED ORDER — LACTATED RINGERS IV SOLN
INTRAVENOUS | Status: DC | PRN
  Administered 2018-06-05: 10:00:00 via INTRAVENOUS

## 2018-06-05 MED ORDER — PHENYLEPHRINE 40 MCG/ML (10ML) SYRINGE FOR IV PUSH (FOR BLOOD PRESSURE SUPPORT)
PREFILLED_SYRINGE | INTRAVENOUS | Status: DC | PRN
  Administered 2018-06-05: 80 ug via INTRAVENOUS

## 2018-06-05 MED ORDER — ACETAMINOPHEN 650 MG RE SUPP: 650.0000 mg | RECTAL | Status: DC | PRN

## 2018-06-05 MED ORDER — LIDOCAINE-EPINEPHRINE 1 %-1:100000 IJ SOLN
INTRAMUSCULAR | Status: DC | PRN
  Administered 2018-06-05: 5 mL

## 2018-06-05 MED ORDER — FENTANYL CITRATE (PF) 250 MCG/5ML IJ SOLN
INTRAMUSCULAR | Status: AC
  Filled 2018-06-05: qty 5

## 2018-06-05 MED ORDER — LACTATED RINGERS IV SOLN
INTRAVENOUS | Status: DC
  Administered 2018-06-05: 08:00:00 via INTRAVENOUS

## 2018-06-05 MED ORDER — LABETALOL HCL 5 MG/ML IV SOLN
INTRAVENOUS | Status: AC
  Administered 2018-06-05: 10 mg via INTRAVENOUS
  Filled 2018-06-05: qty 4

## 2018-06-05 MED ORDER — LABETALOL HCL 5 MG/ML IV SOLN
10.0000 mg | INTRAVENOUS | Status: AC | PRN
  Administered 2018-06-05 (×2): 10 mg via INTRAVENOUS

## 2018-06-05 MED ORDER — MEPERIDINE HCL 50 MG/ML IJ SOLN: 6.2500 mg | INTRAMUSCULAR | Status: DC | PRN

## 2018-06-05 MED ORDER — FENTANYL CITRATE (PF) 250 MCG/5ML IJ SOLN
INTRAMUSCULAR | Status: DC | PRN
  Administered 2018-06-05: 100 ug via INTRAVENOUS
  Administered 2018-06-05: 50 ug via INTRAVENOUS

## 2018-06-05 MED ORDER — LEVOTHYROXINE SODIUM 125 MCG PO TABS
125.0000 ug | ORAL_TABLET | Freq: Every day | ORAL | Status: DC
  Administered 2018-06-06: 125 ug via ORAL
  Filled 2018-06-05: qty 1

## 2018-06-05 MED ORDER — BACITRACIN ZINC 500 UNIT/GM EX OINT
1.0000 "application " | TOPICAL_OINTMENT | Freq: Three times a day (TID) | CUTANEOUS | Status: DC
  Administered 2018-06-05 – 2018-06-06 (×3): 1 via TOPICAL
  Filled 2018-06-05: qty 28.35

## 2018-06-05 MED ORDER — CEPHALEXIN 250 MG/5ML PO SUSR
500.0000 mg | Freq: Two times a day (BID) | ORAL | Status: DC
  Administered 2018-06-05 – 2018-06-06 (×3): 500 mg via ORAL
  Filled 2018-06-05 (×3): qty 10

## 2018-06-05 MED ORDER — PROPOFOL 10 MG/ML IV BOLUS
INTRAVENOUS | Status: AC
  Filled 2018-06-05: qty 20

## 2018-06-05 MED ORDER — SUGAMMADEX SODIUM 200 MG/2ML IV SOLN
INTRAVENOUS | Status: DC | PRN
  Administered 2018-06-05: 200 mg via INTRAVENOUS
  Administered 2018-06-05: 50 mg via INTRAVENOUS

## 2018-06-05 MED ORDER — MIDAZOLAM HCL 2 MG/2ML IJ SOLN
INTRAMUSCULAR | Status: AC
  Filled 2018-06-05: qty 2

## 2018-06-05 MED ORDER — CEFAZOLIN SODIUM-DEXTROSE 2-3 GM-%(50ML) IV SOLR
INTRAVENOUS | Status: DC | PRN
  Administered 2018-06-05: 2 g via INTRAVENOUS

## 2018-06-05 MED ORDER — DEXAMETHASONE SODIUM PHOSPHATE 10 MG/ML IJ SOLN
INTRAMUSCULAR | Status: DC | PRN
  Administered 2018-06-05: 10 mg via INTRAVENOUS

## 2018-06-05 MED ORDER — MIDAZOLAM HCL 5 MG/5ML IJ SOLN
INTRAMUSCULAR | Status: DC | PRN
  Administered 2018-06-05: 2 mg via INTRAVENOUS

## 2018-06-05 MED ORDER — DEXTROSE-NACL 5-0.9 % IV SOLN
INTRAVENOUS | Status: DC
  Administered 2018-06-05 – 2018-06-06 (×2): via INTRAVENOUS

## 2018-06-05 MED ORDER — PROPOFOL 10 MG/ML IV BOLUS
INTRAVENOUS | Status: DC | PRN
  Administered 2018-06-05: 120 mg via INTRAVENOUS

## 2018-06-05 SURGICAL SUPPLY — 40 items
BENZOIN TINCTURE PRP APPL 2/3 (GAUZE/BANDAGES/DRESSINGS) IMPLANT
BLADE SURG 15 STRL LF DISP TIS (BLADE) ×1 IMPLANT
BLADE SURG 15 STRL SS (BLADE) ×2
CANISTER SUCTION 1200CC (MISCELLANEOUS) ×3 IMPLANT
CATH ROBINSON RED A/P 10FR (CATHETERS) IMPLANT
CATH ROBINSON RED A/P 14FR (CATHETERS) IMPLANT
CATH ROBINSON RED A/P 16FR (CATHETERS) IMPLANT
CLEANER TIP ELECTROSURG 2X2 (MISCELLANEOUS) ×6 IMPLANT
CLOSURE WOUND 1/2 X4 (GAUZE/BANDAGES/DRESSINGS)
COVER BACK TABLE 60X90IN (DRAPES) IMPLANT
COVER MAYO STAND STRL (DRAPES) ×3 IMPLANT
COVER WAND RF STERILE (DRAPES) ×3 IMPLANT
DRAPE HALF SHEET 40X57 (DRAPES) IMPLANT
DRAPE ORTHO SPLIT 77X108 STRL (DRAPES) ×2
DRAPE SURG ORHT 6 SPLT 77X108 (DRAPES) ×1 IMPLANT
ELECT COATED BLADE 2.86 ST (ELECTRODE) ×3 IMPLANT
ELECT REM PT RETURN 9FT ADLT (ELECTROSURGICAL) ×3
ELECTRODE REM PT RTRN 9FT ADLT (ELECTROSURGICAL) ×1 IMPLANT
GAUZE 4X4 16PLY RFD (DISPOSABLE) ×3 IMPLANT
GAUZE SPONGE 4X4 12PLY STRL (GAUZE/BANDAGES/DRESSINGS) IMPLANT
GLOVE ECLIPSE 7.5 STRL STRAW (GLOVE) ×3 IMPLANT
GOWN STRL REUS W/ TWL XL LVL3 (GOWN DISPOSABLE) ×1 IMPLANT
GOWN STRL REUS W/TWL XL LVL3 (GOWN DISPOSABLE) ×2
KIT BASIN OR (CUSTOM PROCEDURE TRAY) ×3 IMPLANT
NDL SAFETY ECLIPSE 18X1.5 (NEEDLE) IMPLANT
NEEDLE HYPO 18GX1.5 SHARP (NEEDLE)
NEEDLE PRECISIONGLIDE 27X1.5 (NEEDLE) ×3 IMPLANT
PACK SURGICAL SETUP 50X90 (CUSTOM PROCEDURE TRAY) ×3 IMPLANT
PENCIL FOOT CONTROL (ELECTRODE) ×3 IMPLANT
STRIP CLOSURE SKIN 1/2X4 (GAUZE/BANDAGES/DRESSINGS) IMPLANT
SUCTION FRAZIER HANDLE 10FR (MISCELLANEOUS) ×2
SUCTION TUBE FRAZIER 10FR DISP (MISCELLANEOUS) ×1 IMPLANT
SUT CHROMIC 3 0 SH 27 (SUTURE) ×6 IMPLANT
SUT SILK 2 0 PERMA HAND 18 BK (SUTURE) IMPLANT
SYR CONTROL 10ML LL (SYRINGE) ×3 IMPLANT
TAPE PAPER 3X10 WHT MICROPORE (GAUZE/BANDAGES/DRESSINGS) IMPLANT
TOWEL OR 17X24 6PK STRL BLUE (TOWEL DISPOSABLE) ×3 IMPLANT
TUBE CONNECTING 20'X1/4 (TUBING) ×1
TUBE CONNECTING 20X1/4 (TUBING) ×2 IMPLANT
WATER STERILE IRR 1000ML POUR (IV SOLUTION) ×3 IMPLANT

## 2018-06-05 NOTE — Anesthesia Procedure Notes (Signed)
Procedure Name: Intubation Date/Time: 06/05/2018 10:05 AM Performed by: Jearld Pies, CRNA Pre-anesthesia Checklist: Patient identified, Emergency Drugs available, Suction available and Patient being monitored Patient Re-evaluated:Patient Re-evaluated prior to induction Oxygen Delivery Method: Circle system utilized Preoxygenation: Pre-oxygenation with 100% oxygen Induction Type: IV induction Tube size: 6.0 mm Number of attempts: 1 Comments: Ossey MD inserted reinforced 6.0 ett through laryngectomy stoma, + breath sounds bilateral breath sounds, + etCO2, Rosen MD present for procedure.

## 2018-06-05 NOTE — Interval H&P Note (Signed)
History and Physical Interval Note:  06/05/2018 9:11 AM  Jonathan [name]  has presented today for surgery, with the diagnosis of Laryngeal cancer  The various methods of treatment have been discussed with the patient and family. After consideration of risks, benefits and other options for treatment, the patient has consented to  Procedure(s): Tracheal STOMAPLASTY (N/A) as a surgical intervention .  The patient's history has been reviewed, patient examined, no change in status, stable for surgery.  I have reviewed the patient's chart and labs.  Questions were answered to the patient's satisfaction.     Izora Gala

## 2018-06-05 NOTE — Progress Notes (Signed)
   ENT Progress Note:  s/p Procedure(s): Tracheal STOMAPLASTY   Subjective: Airway stable, no pain  Objective: Vital signs in last 24 hours: Temp:  [97.8 F (36.6 C)-98.9 F (37.2 C)] 98.6 F (37 C) (03/04 1409) Pulse Rate:  [83-94] 84 (03/04 1409) Resp:  [16-26] 18 (03/04 1409) BP: (148-186)/(101-118) 177/118 (03/04 1409) SpO2:  [95 %-100 %] 100 % (03/04 1409) Weight:  [65.3 kg] 65.3 kg (03/04 0718) Weight change:     Intake/Output from previous day: No intake/output data recorded. Intake/Output this shift: Total I/O In: 500 [I.V.:500] Out: 5 [Blood:5]  Labs: Recent Labs    06/05/18 0735  HGB 12.7*   No results for input(s): NA, K, CL, CO2, GLUCOSE, BUN, CALCIUM in the last 72 hours.  Invalid input(s): CREATININR  Studies/Results: No results found.   PHYSICAL EXAM: Stoma intact, no bleeding or swelling   Assessment/Plan: Pt stable O/N obs after stomaplasy Humidified trach collar o/n.    Jerrell Belfast 06/05/2018, 5:27 PM

## 2018-06-05 NOTE — Anesthesia Postprocedure Evaluation (Signed)
Anesthesia Post Note  Patient: Jonathan [name]  Procedure(s) Performed: Tracheal STOMAPLASTY (N/A Neck)     Patient location during evaluation: PACU Anesthesia Type: General Level of consciousness: awake and alert Pain management: pain level controlled Vital Signs Assessment: post-procedure vital signs reviewed and stable Respiratory status: spontaneous breathing, nonlabored ventilation, respiratory function stable and patient connected to nasal cannula oxygen Cardiovascular status: blood pressure returned to baseline and stable Postop Assessment: no apparent nausea or vomiting Anesthetic complications: no    Last Vitals:  Vitals:   06/05/18 1309 06/05/18 1409  BP: (!) 186/114 (!) 177/118  Pulse: 88 84  Resp: 17 18  Temp: 36.8 C 37 C  SpO2: 100% 100%    Last Pain:  Vitals:   06/05/18 1409  TempSrc: Oral  PainSc:                  [name] DAVID

## 2018-06-05 NOTE — Op Note (Signed)
OPERATIVE REPORT  DATE OF SURGERY: 06/05/2018  PATIENT:  Jonathan [name],  58 y.o. male  PRE-OPERATIVE DIAGNOSIS: Tracheal stomal stenosis  POST-OPERATIVE DIAGNOSIS: Same  PROCEDURE:  Procedure(s): Tracheal STOMAPLASTY  SURGEON:  Beckie Salts, MD  ASSISTANTS: None  ANESTHESIA:   General   EBL: 10 ml  DRAINS: None  LOCAL MEDICATIONS USED: 1% Xylocaine with epinephrine  SPECIMEN:  none  COUNTS:  Correct  PROCEDURE DETAILS: The patient was taken to the operating room and placed on the operating table in the supine position. Following induction of general anesthesia, with an endotracheal tube in the tracheal stoma, the neck was prepped and draped in a standard fashion.  The procedure was done with the tube being removed and replaced as needed.  Measurements were taken and the stoma measured approximately 1 x 1 cm.  Bilateral Z-plasties were outlined with a marking pen laterally on each side.  Each limb was approximately 1.5 cm.  Local anesthetic was infiltrated.  A V to Y advancement was outlined as well inferiorly at 6:00.  Local anesthetic was infiltrated there.  All skin incisions and mucosal incisions were created using a 15 scalpel.  Electrocautery was used to assist in undermining and for hemostasis.  The limbs of the Z-plasty on both sides were reversed and reapproximated using interrupted 3-0 chromic sutures.  The advancement inferiorly was accomplished in a similar fashion.  The wound was irrigated with local anesthetic solution and cleaned.  Bacitracin was applied at the end of the procedure.  Measurement was taken at the end and the stoma was now 1.5 laterally by 2 cm vertically.  Patient was awakened extubated and transferred to recovery in stable condition.    PATIENT DISPOSITION:  To PACU, stable

## 2018-06-05 NOTE — Transfer of Care (Signed)
Immediate Anesthesia Transfer of Care Note  Patient: Jonathan [name]  Procedure(s) Performed: Tracheal STOMAPLASTY (N/A Neck)  Patient Location: PACU  Anesthesia Type:General  Level of Consciousness: awake, alert  and oriented  Airway & Oxygen Therapy: Patient Spontanous Breathing and Patient connected to face mask oxygen  Post-op Assessment: Report given to RN and Post -op Vital signs reviewed and stable  Post vital signs: Reviewed and stable  Last Vitals:  Vitals Value Taken Time  BP 165/109 06/05/2018 11:09 AM  Temp 36.6 C 06/05/2018 11:07 AM  Pulse 94 06/05/2018 11:12 AM  Resp 27 06/05/2018 11:12 AM  SpO2 96 % 06/05/2018 11:12 AM  Vitals shown include unvalidated device data.  Last Pain:  Vitals:   06/05/18 1107  TempSrc:   PainSc: 0-No pain      Patients Stated Pain Goal: 0 (67/61/95 0932)  Complications: No apparent anesthesia complications

## 2018-06-06 ENCOUNTER — Encounter (HOSPITAL_COMMUNITY): Payer: Self-pay | Admitting: Otolaryngology

## 2018-06-06 DIAGNOSIS — J9503 Malfunction of tracheostomy stoma: Secondary | ICD-10-CM | POA: Diagnosis not present

## 2018-06-06 MED ORDER — HYDROCODONE-ACETAMINOPHEN 7.5-325 MG PO TABS: 1.0000 | ORAL_TABLET | Freq: Four times a day (QID) | ORAL | 0 refills | Status: DC | PRN

## 2018-06-06 MED ORDER — CEPHALEXIN 500 MG PO CAPS: 500.0000 mg | ORAL_CAPSULE | Freq: Three times a day (TID) | ORAL | 0 refills | Status: DC

## 2018-06-06 MED FILL — CEPHALEXIN 500 MG CAPSULE: 500 | 5 days supply | Qty: 15 | Fill #0

## 2018-06-06 MED FILL — HYDROCODON-APAP 7.5-325: 7.5-325 | 5 days supply | Qty: 20 | Fill #0

## 2018-06-06 NOTE — Discharge Summary (Signed)
  Physician Discharge Summary  Patient ID: Jonathan [name] MRN: [id number] DOB/AGE: 1960-12-23 58 y.o.  Admit date: 06/05/2018 Discharge date: 06/06/2018  Admission Diagnoses: Tracheal stomal stenosis  Discharge Diagnoses:  Active Problems:   Stenosis of tracheal stoma   Discharged Condition: good  Hospital Course: No complications  Consults: none  Significant Diagnostic Studies: none  Treatments: surgery: Stomal plasty  Discharge Exam: Blood pressure (!) 137/94, pulse 88, temperature 98.5 F (36.9 C), temperature source Oral, resp. rate 18, height 5' 5" (1.651 m), weight 65.3 kg, SpO2 100 %. PHYSICAL EXAM: Stoma is clean and widely patent.  He is eating and breathing very nicely.  Disposition: Discharge disposition: 01-Home or Self Care       Discharge Instructions    Diet - low sodium heart healthy   Complete by:  As directed    Increase activity slowly   Complete by:  As directed      Allergies as of 06/06/2018   No Known Allergies     Medication List    TAKE these medications   fluticasone 50 MCG/ACT nasal spray Commonly known as:  FLONASE ALLERGY RELIEF Place 2 sprays into both nostrils daily.   HYDROcodone-acetaminophen 7.5-325 MG tablet Commonly known as:  NORCO Take 1 tablet by mouth every 6 (six) hours as needed for moderate pain.   levothyroxine 125 MCG tablet Commonly known as:  SYNTHROID Take 1 tablet (125 mcg total) by mouth daily.   naproxen 500 MG tablet Commonly known as:  NAPROSYN Take 1 tablet (500 mg total) by mouth 2 (two) times daily with a meal. What changed:    when to take this  reasons to take this   SUPREP BOWEL PREP KIT 17.5-3.13-1.6 GM/177ML Soln Generic drug:  Na Sulfate-K Sulfate-Mg Sulf Take 1 kit by mouth as directed.      Follow-up Information    Izora Gala, MD. Schedule an appointment as soon as possible for a visit in 2 week(s).   Specialty:  Otolaryngology Contact information: [address] [place] [address] [phone]           Signed: Izora Gala 06/06/2018, 8:42 AM

## 2018-06-06 NOTE — Discharge Instructions (Signed)
Keep the stoma clean and moist.  Apply ointment 2 or 3 times daily at least.  Use a humidifier at home if you have one.  Clean out any scabs or crusts the best you can.

## 2018-06-13 DIAGNOSIS — Z430 Encounter for attention to tracheostomy: Secondary | ICD-10-CM | POA: Diagnosis not present

## 2018-06-17 MED FILL — NAPROXEN 500 MG TABLET: 500 | 15 days supply | Qty: 30 | Fill #2

## 2018-06-17 MED FILL — LEVOTHYROXINE 125 MCG TABLE: 125 | 30 days supply | Qty: 30 | Fill #1

## 2018-06-19 ENCOUNTER — Telehealth: Payer: Self-pay

## 2018-06-19 ENCOUNTER — Telehealth: Payer: Self-pay | Admitting: Hematology and Oncology

## 2018-06-19 NOTE — Telephone Encounter (Signed)
-----   Message from Heath Lark, MD sent at 06/19/2018  8:31 AM EDT ----- Regarding: move appt out 1 month He was only for routine follow-up I recommend reschedule to 1 month out

## 2018-06-19 NOTE — Telephone Encounter (Signed)
Called and left a message asking him to cal the office regarding upcoming appt.

## 2018-06-19 NOTE — Telephone Encounter (Signed)
Called back and left below message. Scheduling message sent to reschedule.

## 2018-06-19 NOTE — Telephone Encounter (Signed)
R/s appt per 3/18 sch message - pt aware of appt date and time

## 2018-06-24 ENCOUNTER — Telehealth: Payer: Self-pay | Admitting: Internal Medicine

## 2018-06-24 ENCOUNTER — Encounter: Payer: Self-pay | Admitting: Internal Medicine

## 2018-06-24 NOTE — Telephone Encounter (Signed)
See note

## 2018-06-24 NOTE — Telephone Encounter (Signed)
Pt would like a physician to call regarding the dosage for his thyroid medicine; pt contact [phone]

## 2018-06-24 NOTE — Telephone Encounter (Signed)
Returned call, left message with spouse [name] (patient is non verbal) that he should be taking the 125 mcg synthroid tablets. He needs to schedule a lab appointment to have his TSH rechecked. Future lab order already placed. Thanks!

## 2018-07-01 ENCOUNTER — Ambulatory Visit: Payer: Self-pay | Admitting: Hematology and Oncology

## 2018-07-31 ENCOUNTER — Telehealth: Payer: Self-pay

## 2018-07-31 NOTE — Telephone Encounter (Signed)
Jonathan [name] called and left a message regarding upcoming appt on 5/1. Would like to reschedule appt to June unless it is necessary due to COVID-19.  Called back and left a message for her to call the office back.

## 2018-08-01 NOTE — Telephone Encounter (Signed)
Called back. Per Dr. Alvy Bimler okay to move appt to June. He verbalized understanding.  Appt canceled for tomorrow and scheduling message sent to reschedule to June.

## 2018-08-02 ENCOUNTER — Inpatient Hospital Stay: Payer: Medicaid Other | Admitting: Hematology and Oncology

## 2018-08-02 ENCOUNTER — Telehealth: Payer: Self-pay | Admitting: Hematology and Oncology

## 2018-08-02 NOTE — Telephone Encounter (Signed)
Scheduled apt for June per sch msg. Mailed printout .

## 2018-08-06 ENCOUNTER — Other Ambulatory Visit: Payer: Self-pay | Admitting: Internal Medicine

## 2018-08-06 DIAGNOSIS — M25562 Pain in left knee: Secondary | ICD-10-CM

## 2018-08-06 DIAGNOSIS — J019 Acute sinusitis, unspecified: Secondary | ICD-10-CM

## 2018-08-06 DIAGNOSIS — E039 Hypothyroidism, unspecified: Secondary | ICD-10-CM

## 2018-08-06 DIAGNOSIS — G8929 Other chronic pain: Secondary | ICD-10-CM

## 2018-08-06 DIAGNOSIS — M25561 Pain in right knee: Secondary | ICD-10-CM

## 2018-08-06 NOTE — Telephone Encounter (Signed)
Refill Request  [place], [place]

## 2018-08-07 ENCOUNTER — Other Ambulatory Visit (INDEPENDENT_AMBULATORY_CARE_PROVIDER_SITE_OTHER): Payer: Medicaid Other

## 2018-08-07 ENCOUNTER — Other Ambulatory Visit: Payer: Self-pay

## 2018-08-07 DIAGNOSIS — E039 Hypothyroidism, unspecified: Secondary | ICD-10-CM | POA: Diagnosis not present

## 2018-08-07 MED ORDER — NAPROXEN 500 MG PO TABS: 500.0000 mg | ORAL_TABLET | Freq: Two times a day (BID) | ORAL | 2 refills | Status: DC | PRN

## 2018-08-07 MED ORDER — FLUTICASONE PROPIONATE 50 MCG/ACT NA SUSP: 2.0000 | Freq: Every day | NASAL | 2 refills | Status: DC

## 2018-08-07 MED ORDER — LEVOTHYROXINE SODIUM 125 MCG PO TABS: 125.0000 ug | ORAL_TABLET | Freq: Every day | ORAL | 0 refills | Status: DC

## 2018-08-07 NOTE — Telephone Encounter (Signed)
Pt's wife called back - informed front office she can bring pt now for labs.

## 2018-08-07 NOTE — Telephone Encounter (Signed)
Approved 1 month refill of synthroid. Patient needs TSH recheck ASAP. Previously ordered for march but not done. Please have patient schedule a lab appointment. Thank you.

## 2018-08-07 NOTE — Telephone Encounter (Signed)
Called pt to schedule a lab appt per Dr Philipp Ovens - no answer at home #; called cell #, his wife answered the phone. She's not at home but will call back to schedule lab appt.

## 2018-08-08 LAB — TSH: TSH: 13.12 u[IU]/mL — ABNORMAL HIGH (ref 0.450–4.500)

## 2018-08-08 MED ORDER — LEVOTHYROXINE SODIUM 150 MCG PO TABS: 150.0000 ug | ORAL_TABLET | Freq: Every day | ORAL | 1 refills | Status: DC

## 2018-08-08 NOTE — Addendum Note (Signed)
Addended by: Jodean Lima on: 08/08/2018 09:43 AM   Modules accepted: Orders

## 2018-08-08 NOTE — Progress Notes (Signed)
TSH improved but still elevated, 101 -> 13. Currently taking synthroid 125 mcg. Called spouse margaret. Left voicemail with plan to increase to 150 mcg. New prescription sent to [place]. Plan for repeat TSH again in 6 weeks.

## 2018-08-09 ENCOUNTER — Other Ambulatory Visit: Payer: Self-pay | Admitting: *Deleted

## 2018-08-09 ENCOUNTER — Telehealth: Payer: Self-pay | Admitting: *Deleted

## 2018-08-09 MED ORDER — LEVOTHYROXINE SODIUM 150 MCG PO TABS: 150.0000 ug | ORAL_TABLET | Freq: Every day | ORAL | 1 refills | Status: DC

## 2018-08-09 MED FILL — LEVOTHYROXINE 150 MCG TAB: 150 | 30 days supply | Qty: 30 | Fill #0

## 2018-08-09 NOTE — Telephone Encounter (Signed)
Informed wife that med will be ready for pick up today

## 2018-08-09 NOTE — Telephone Encounter (Signed)
Fax from [facility] stating prescriber is not registered w/Medicaid . Rx was refilled by Dr Philipp Ovens on 5/7. Please re-send. Thanks  I will also send a message to [place].

## 2018-08-29 DIAGNOSIS — Z430 Encounter for attention to tracheostomy: Secondary | ICD-10-CM | POA: Diagnosis not present

## 2018-09-02 ENCOUNTER — Encounter: Payer: Self-pay | Admitting: Hematology and Oncology

## 2018-09-02 ENCOUNTER — Inpatient Hospital Stay: Payer: Medicaid Other | Attending: Hematology and Oncology | Admitting: Hematology and Oncology

## 2018-09-02 ENCOUNTER — Other Ambulatory Visit: Payer: Self-pay

## 2018-09-02 DIAGNOSIS — E039 Hypothyroidism, unspecified: Secondary | ICD-10-CM

## 2018-09-02 DIAGNOSIS — Z8589 Personal history of malignant neoplasm of other organs and systems: Secondary | ICD-10-CM

## 2018-09-02 DIAGNOSIS — C321 Malignant neoplasm of supraglottis: Secondary | ICD-10-CM

## 2018-09-02 NOTE — Assessment & Plan Note (Signed)
He has acquired hypothyroidism status post laryngectomy and radiation He will continue primary care follow-up and have his TSH monitor and medication adjusted.

## 2018-09-02 NOTE — Progress Notes (Signed)
[place] OFFICE PROGRESS NOTE  Patient Care Team: Velna Ochs, MD as PCP - General Eppie Gibson, MD as Attending Physician (Radiation Oncology) Karie Mainland, RD as Dietitian (Nutrition)  ASSESSMENT & PLAN:  Malignant neoplasm of supraglottis ([place]) Clinically, he has no signs of cancer recurrence The patient has quit smoking permanently He will continue follow-up with ENT I will see him back in 1 year for further follow-up  Acquired hypothyroidism He has acquired hypothyroidism status post laryngectomy and radiation He will continue primary care follow-up and have his TSH monitor and medication adjusted.   No orders of the defined types were placed in this encounter.   INTERVAL HISTORY: Please see below for problem oriented charting. He returns for further follow-up He has been doing well since his laryngectomy No recent infection, fever or chills Denies swallowing difficulties He is gaining weight He denies recent smoking or drinking  SUMMARY OF ONCOLOGIC HISTORY:   Malignant neoplasm of supraglottis ([place])   04/17/2016 Imaging    Ct neck showed relatively large approximately 3.5 cm laryngeal mass most compatible with laryngeal carcinoma. Epicenter at the glottis on the right. Extension across midline as well as involvement of laryngeal cartilages (possibly also bilateral). 2. Relatively small but malignant appearing right level 2 lymphadenopathy. Suspicious contralateral left level 2 lymph nodes.    04/21/2016 Initial Diagnosis    He saw Dr. Constance Holster in the clinic for chronic hoarseness. In the office, it was noted that the posterior soft palate, uvula, tongue base and vallecula were visualized and appeared healthy without mucosal masses or lesions. There is a large mass involving the right side of the larynx, the right cord is fixed. There may be involvement of the post cricoid area it is difficult to say for sure. 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 Clinically this is at least staged as T3. The airway is significantly impaired. He is at risk for impending airway obstruction. ENT recommended him for awake tracheostomy with direct laryngoscopy and biopsy and esophagoscopy under anesthesia immediately following that.     04/24/2016 Pathology Results    Larynx, biopsy, Right Side - POSITIVE FOR INVASIVE SQUAMOUS CELL CARCINOMA. - SEE COMMENT. Microscopic Comment As sampled, the squamous cell carcinoma appears well differentiated. A fragment of benign cartilage is also present in the specimen.    04/24/2016 Surgery    She underwent direct laryngoscopy with biopsy. An anterior commissure scope was used to evaluate the larynx. A large mass and, using the right side of the larynx and the anterior commissure area was identified. Multiple biopsies were taken. The left cord was swollen but seemed to be uninvolved. Piriforms appeared to be clear. The post cricoid area appeared clear. Esophagoscopy was difficult and ENT surgeon was unable to pass the scope into the esophagus. He had tracheostomy    04/24/2016 Pathology Results    Larynx, biopsy, Right Side - POSITIVE FOR INVASIVE SQUAMOUS CELL CARCINOMA. - SEE COMMENT. Microscopic Comment As sampled, the squamous cell carcinoma appears well differentiated. A fragment of benign cartilage is also present in the specimen.     05/15/2016 PET scan    Right eccentric glottic mass, appears to cross the anterior commissure with destructive findings of the thyroid cartilage. There is an upper normal size right station 2A lymph node with maximum SUV of 4.2 which is only very faintly elevated over background, but which could possibly represent a small amount of local metastatic spread.  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 2. Asymmetric calcification along the head of the right caudate nucleus and  adjacent lentiform nucleus. Well this can sometimes be physiologic, there is some accompanying reduction in PET activity, and the asymmetry is an unusual feature. Accordingly I recommend MRI of the brain with and without contrast for further characterization. 3. A AP window lymph node is minimally enlarged at 1.1 cm, but with a maximum SUV below background mediastinal levels, likely benign.    05/23/2016 Imaging    MRI brain: Atrophy with chronic microvascular ischemic change. No intracranial metastatic disease. 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     05/26/2016 Surgery    Multiple extraction of tooth numbers 2, 5, 6, 8-13, and  21-28. 2. 4 Quadrants of alveoloplasty. 3. Bilateral mandibular lingual tori reductions 4. Maxillary right, maxillary left, and mandibular left buccal exostoses reductions    06/02/2016 Procedure    Successful placement of a right internal jugular approach power injectable Port-A-Cath. The catheter is ready for immediate use    06/09/2016 - 08/22/2016 Chemotherapy    He received induction chemotherapy with taxotere, cisplatin and 5 FU x 4 cycles    07/31/2016 PET scan    Mild asymmetric soft tissue prominence along the right supraglottic larynx, without hypermetabolism, likely corresponding to treated tumor. No findings suspicious for metastatic disease. Small mediastinal and left axillary nodes, without associated hypermetabolism.    09/11/2016 - 10/30/2016 Radiation Therapy    He received radiation Radiation treatment dates:   09/11/16-10/30/16  Site/dose:   Larynx and b/l neck, 70 Gy in 35 fractions  Beams/energy:   IMRT, 6X     01/01/2017 Imaging    CT neck: Asymmetric soft tissue prominence in the right supraglottic region extending down to the glottis on the right. This appears similar to the previous PET  scan at which time there was not any hypermetabolism in the region. Therefore, this could be chronic post treatment change. However, there does appear to be some enhancing tissue and I cannot exclude the possibility of residual or recurrent tumor on the basis of this exam. There is no evidence of any nodal disease.    05/04/2017 Procedure    Trachestomy was removed    06/19/2017 Imaging    MRI brain 1. Abnormal enhancement versus intrinsic density RIGHT basal ganglia, subacute infarct or metastasis possible. 2. More conspicuous 8 x 10 mm nodular enhancement RIGHT supraglottic fat at site of primary tumor associated with vocal cord paralysis. Recurrent tumor suspected. Recommend direct inspection. 3. Post radiation changes of the neck including sialoadenitis. Worsening supraglottic edema. 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  4. Interval tracheostomy tube removal.    06/21/2017 Imaging    MRI brain 1. No evidence of intracranial metastases or acute abnormality. 2. 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 Basal ganglia mineralization accounts for the density on CT.    06/22/2017 PET scan    1. There is continued soft tissue prominence along the right glottic and supraglottic region but without significant accentuated hypermetabolic activity to further suggest recurrence. No hypermetabolic adenopathy or other lesion to suggest active malignancy at this time. 2. Stable appearance of hyperdensity along the right basal ganglia attributed to underlying developmental venous anomaly based on prior MRI workup.  3.  Other imaging findings of potential clinical significance: Aortic Atherosclerosis (ICD10-I70.0). Coronary atherosclerosis.    07/06/2017 Surgery    He had direct laryngoscopy and biopsy    07/11/2017 Pathology Results    Biopsy confirmed invasive squamous cell carcinoma    08/10/2017 Surgery     PROCEDURE:  Procedure(s): Dr. Constance Holster performed TOTAL LARYNGECTOMY      08/10/2017 Pathology Results    1. Pharynx, biopsy, right margin - BENIGN FIBROADIPOSE TISSUE. - THERE IS NO EVIDENCE OF MALIGNANCY. 2. Larynx, laryngectomy, Total - INVASIVE SQUAMOUS CELL CARCINOMA, WELL DIFFERENTIATED, HORIZONTALLY SPANNING 1.0 CM. - CARCINOMA INVADES 0.1 CM. - THE SURGICAL RESECTIONS ARE NEGATIVE FOR CARCINOMA. - SEE ONCOLOGY TABLE BELOW. Microscopic Comment 2. LARYNX (SUPRAGLOTTIS, GLOTTIS, SUBGLOTTIS): Procedure: Laryngectomy. Tumor Site: Right true cord with extension to midline. Tumor Laterality: Right. Tumor Focality: Unifocal. Tumor Size: 1.0 cm horizontally (gross measurement), invades 0.1 cm. Histologic Type: Squamous cell carcinoma. Histologic Grade: Well differentiated. Margins: Negative, closest margin is right lateral mucosal margin at 3.0 cm (gross measurement). Lymphovascular Invasion: Not identified. Perineural Invasion: Not identified. Regional Lymph Nodes: None examined. Pathologic Stage Classification (pTNM, AJCC 8th Edition): pT1a, pNX. Ancillary Studies: Can be performed upon clinician request. Representative Tumor Block: 2H.    04/16/2018 Procedure    Removal of implanted Port-A-Cath utilizing sharp and blunt dissection. The procedure was uncomplicated     REVIEW OF SYSTEMS:   Constitutional: Denies fevers, chills or abnormal weight loss Eyes: Denies blurriness of vision Ears, nose, mouth, throat, and face: Denies mucositis or sore throat Respiratory: Denies cough, dyspnea or wheezes Cardiovascular: Denies palpitation, chest discomfort or lower extremity swelling Gastrointestinal:  Denies nausea, heartburn or change in bowel habits Skin: Denies abnormal skin rashes Lymphatics: Denies new lymphadenopathy or easy bruising Neurological:Denies numbness, tingling or new weaknesses Behavioral/Psych: Mood is stable, no new changes  All other systems were reviewed with  the patient and are negative.  I have reviewed the past medical history, past surgical history, social history and family history with the patient and they are unchanged from previous note.  ALLERGIES:  has No Known Allergies.  MEDICATIONS:  Current Outpatient Medications  Medication Sig Dispense Refill  . fluticasone (FLONASE ALLERGY RELIEF) 50 MCG/ACT nasal spray Place 2 sprays into both nostrils daily. 16 g 2  . levothyroxine (SYNTHROID) 150 MCG tablet Take 1 tablet (150 mcg total) by mouth daily before breakfast. 30 tablet 1  . naproxen (NAPROSYN) 500 MG tablet Take 1 tablet (500 mg total) by mouth 2 (two) times daily as needed. 30 tablet 2   No current facility-administered medications for this visit.     PHYSICAL EXAMINATION: ECOG PERFORMANCE STATUS: 0 - Asymptomatic  Vitals:   09/02/18 0958  BP: (!) 158/101  Pulse: (!) 102  Resp: 18  Temp: 98.9 F (37.2 C)  SpO2: 95%   Filed Weights   09/02/18 0958  Weight: 150 lb 3.2 oz (68.1 kg)    GENERAL:alert, no distress and comfortable SKIN: skin color, texture, turgor are normal, no rashes or significant lesions EYES: normal, Conjunctiva are pink and non-injected, sclera clear OROPHARYNX:no exudate, no erythema and lips, buccal mucosa, and tongue normal  NECK: Well-healed surgical scar.  Tracheostomy in situ LYMPH:  no palpable lymphadenopathy in the cervical, axillary or inguinal LUNGS: clear to auscultation and percussion with normal breathing effort HEART: regular rate & rhythm and no murmurs and no lower extremity edema ABDOMEN:abdomen soft, non-tender and normal bowel sounds Musculoskeletal:no cyanosis of digits and no clubbing  NEURO: alert & oriented  x 3 with digital speech, no focal motor/sensory deficits  LABORATORY DATA:  I have reviewed the data as listed    Component Value Date/Time   NA 143 04/16/2018 0830   NA 141 01/04/2017 1432   K 4.2 04/16/2018 0830   K 4.1 01/04/2017 1432   CL 108 04/16/2018 0830    CO2 26 04/16/2018 0830   CO2 27 01/04/2017 1432   GLUCOSE 89 04/16/2018 0830   GLUCOSE 93 01/04/2017 1432   BUN 10 04/16/2018 0830   BUN 12.2 01/04/2017 1432   CREATININE 1.19 04/16/2018 0830   CREATININE 0.8 01/04/2017 1432   CALCIUM 9.0 04/16/2018 0830   CALCIUM 9.8 01/04/2017 1432   PROT 7.7 04/12/2017 1405   PROT 6.9 01/04/2017 1432   ALBUMIN 4.2 04/12/2017 1405   ALBUMIN 3.8 01/04/2017 1432   AST 16 04/12/2017 1405   AST 18 01/04/2017 1432   ALT 11 04/12/2017 1405   ALT 12 01/04/2017 1432   ALKPHOS 81 04/12/2017 1405   ALKPHOS 67 01/04/2017 1432   BILITOT 0.4 04/12/2017 1405   BILITOT 0.23 01/04/2017 1432   GFRNONAA >60 04/16/2018 0830   GFRNONAA 82 03/06/2011 0853   GFRAA >60 04/16/2018 0830   GFRAA >89 03/06/2011 0853    No results found for: SPEP, UPEP  Lab Results  Component Value Date   WBC 3.9 (L) 04/16/2018   NEUTROABS 2.3 04/16/2018   HGB 12.7 (L) 06/05/2018   HCT 41.1 04/16/2018   MCV 96.3 04/16/2018   PLT 223 04/16/2018      Chemistry      Component Value Date/Time   NA 143 04/16/2018 0830   NA 141 01/04/2017 1432   K 4.2 04/16/2018 0830   K 4.1 01/04/2017 1432   CL 108 04/16/2018 0830   CO2 26 04/16/2018 0830   CO2 27 01/04/2017 1432   BUN 10 04/16/2018 0830   BUN 12.2 01/04/2017 1432   CREATININE 1.19 04/16/2018 0830   CREATININE 0.8 01/04/2017 1432      Component Value Date/Time   CALCIUM 9.0 04/16/2018 0830   CALCIUM 9.8 01/04/2017 1432   ALKPHOS 81 04/12/2017 1405   ALKPHOS 67 01/04/2017 1432   AST 16 04/12/2017 1405   AST 18 01/04/2017 1432   ALT 11 04/12/2017 1405   ALT 12 01/04/2017 1432   BILITOT 0.4 04/12/2017 1405   BILITOT 0.23 01/04/2017 1432      All questions were answered. The patient knows to call the clinic with any problems, questions or concerns. No barriers to learning was detected.  I spent 10 minutes counseling the patient face to face. The total time spent in the appointment was 15 minutes and more than  50% was on counseling and review of test results  Heath Lark, MD 09/02/2018 10:22 AM

## 2018-09-02 NOTE — Assessment & Plan Note (Signed)
Clinically, he has no signs of cancer recurrence The patient has quit smoking permanently He will continue follow-up with ENT I will see him back in 1 year for further follow-up

## 2018-09-03 ENCOUNTER — Telehealth: Payer: Self-pay | Admitting: Hematology and Oncology

## 2018-09-03 NOTE — Telephone Encounter (Signed)
Mailed calendar. °

## 2018-09-10 DIAGNOSIS — R490 Dysphonia: Secondary | ICD-10-CM | POA: Diagnosis not present

## 2018-09-10 DIAGNOSIS — C329 Malignant neoplasm of larynx, unspecified: Secondary | ICD-10-CM | POA: Diagnosis not present

## 2018-09-10 DIAGNOSIS — Z923 Personal history of irradiation: Secondary | ICD-10-CM | POA: Diagnosis not present

## 2018-09-10 DIAGNOSIS — Z87891 Personal history of nicotine dependence: Secondary | ICD-10-CM | POA: Diagnosis not present

## 2018-09-11 MED FILL — LEVOTHYROXINE 150 MCG TAB: 150 | 30 days supply | Qty: 30 | Fill #1

## 2018-09-16 NOTE — H&P (Signed)
Jonathan [name] is an 58 y.o. male.   Chief Complaint: aphonia HPI: post laryngectomy aphonia here for TEP.  Past Medical History:  Diagnosis Date  . Allergic rhinitis   . Arthritis    Bilateral knee arthritis for about 20 years. Not taking any medications, believes in nature processes  . Cancer (HCC)    larynx  . Colon polyp   . Dental caries    prechemoradiation therapy, periodontitis  . GERD (gastroesophageal reflux disease)   . Headache   . History of radiation therapy 09/11/16- 10/30/16   Larynx 70 Gy in 35 fractions  . HLD (hyperlipidemia)   . Hypothyroidism    radiation induced hypothyroidism  . Pneumonia    as a child    Past Surgical History:  Procedure Laterality Date  . COLONOSCOPY  03/18/2012  . DIRECT LARYNGOSCOPY N/A 07/06/2017   Procedure: DIRECT LARYNGOSCOPY;  Surgeon: Izora Gala, MD;  Location: [place];  Service: ENT;  Laterality: N/A;  . ESOPHAGOSCOPY  04/24/2016   Procedure: ESOPHAGOSCOPY;  Surgeon: Izora Gala, MD;  Location: [place];  Service: ENT;;  . IR GENERIC HISTORICAL  06/02/2016   IR US GUIDE VASC ACCESS RIGHT 06/02/2016 Sandi Mariscal, MD [facility]  . IR GENERIC HISTORICAL  06/02/2016   IR FLUORO GUIDE PORT INSERTION RIGHT 06/02/2016 Sandi Mariscal, MD [facility]  . IR REMOVAL TUN ACCESS W/ PORT W/O FL MOD SED  04/16/2018  . LARYNGETOMY N/A 08/10/2017   Procedure: TOTAL LARYNGECTOMY;  Surgeon: Izora Gala, MD;  Location: [place];  Service: ENT;  Laterality: N/A;  . LARYNGOSCOPY  04/24/2016   Procedure: LARYNGOSCOPY;  Surgeon: Izora Gala, MD;  Location: [place];  Service: ENT;;  . MULTIPLE EXTRACTIONS WITH ALVEOLOPLASTY N/A 05/26/2016   Procedure: Extraction of tooth #'s 2,5,6,8-13, 21-28 with alveoloplasty, bilateral mandibular tori reductions and buccal exostoses reductions of maxillary left, maxillary right, and mandibular left quadrants.;  Surgeon: Lenn Cal, DDS;  Location: [place];  Service: Oral Surgery;  Laterality: N/A;  . STOMAPLASTY N/A 06/05/2018   Procedure: Tracheal STOMAPLASTY;  Surgeon: Izora Gala, MD;  Location: [place];  Service: ENT;  Laterality: N/A;  . TRACHEOSTOMY TUBE PLACEMENT N/A 04/24/2016   Procedure: TRACHEOSTOMY;  Surgeon: Izora Gala, MD;  Location: [facility];  Service: ENT;  Laterality: N/A;    Family History  Problem Relation Age of Onset  . Prostate cancer Maternal Uncle 66       On radiation therapy now, diagnosed in around June 2012  . Colon cancer Maternal Uncle   . Heart attack Paternal Grandfather        Diet due to MI  . Lung cancer Paternal Aunt   . Brain cancer Paternal Aunt   . Kidney disease Maternal Uncle   . Hypertension Mother   . CAD Father   . Colon polyps Neg Hx   . Diabetes Neg Hx   . Gallbladder disease Neg Hx   . Esophageal cancer Neg Hx    Social History:  reports that he quit smoking about 2 years ago. His smoking use included cigarettes. He has a 15.00 pack-year smoking history. He has never used smokeless tobacco. He reports that he does not drink alcohol or use drugs.  Allergies: No Known Allergies  No medications prior to admission.    No results found for this or any previous visit (from the past 48 hour(s)). No results found.  ROS: otherwise negative  There were no vitals taken for this visit.  PHYSICAL EXAM: Overall  appearance:  Healthy appearing, in no distress Head:  Normocephalic, atraumatic. Ears: External auditory canals are clear; tympanic membranes are intact and the middle ears are free of any effusion. Nose: External nose is healthy in appearance. Internal nasal exam free of any lesions or obstruction. Oral Cavity/pharynx:  There are no mucosal lesions or masses identified. Neuro:  No identifiable neurologic deficits. Neck: No palpable neck masses. Tracheal stoma healthy.  Studies Reviewed: none    Assessment/Plan Proceed with TEP.  Izora Gala 09/16/2018, 8:46 AM

## 2018-09-16 NOTE — Pre-Procedure Instructions (Signed)
[place] [place] [address] [place] [address] Phone: [phone] Fax: [phone]      Your procedure is scheduled on Wednesday 09-25-18  Report to [facility] Main Entrance "A" at 0630 A.M., and check in at the Admitting office.  Call this number if you have problems the morning of surgery:  [phone]  Call [phone] if you have any questions prior to your surgery date Monday-Friday 8am-4pm    Remember:  Do not eat or drink after midnight.   Take these medicines the morning of surgery with A SIP OF WATER :levothyroxine (SYNTHROID)   7 days prior to surgery STOP taking any Aspirin (unless otherwise instructed by your surgeon), Aleve, Naproxen, Ibuprofen, Motrin, Advil, Goody's, BC's, all herbal medications, fish oil, and all vitamins.    The Morning of Surgery  Do not wear jewelry, make-up or nail polish.  Do not wear lotions, powders, or perfumes/colognes, or deodorant  Do not shave 48 hours prior to surgery.  Men may shave face and neck.  Do not bring valuables to the hospital.  [facility] is not responsible for any belongings or valuables.  If you are a smoker, DO NOT Smoke 24 hours prior to surgery IF you wear a CPAP at night please bring your mask, tubing, and machine the morning of surgery   Remember that you must have someone to transport you home after your surgery, and remain with you for 24 hours if you are discharged the same day.   Contacts, glasses, hearing aids, dentures or bridgework may not be worn into surgery.    Leave your suitcase in the car.  After surgery it may be brought to your room.  For patients admitted to the hospital, discharge time will be determined by your treatment team.  Patients discharged the day of surgery will not be allowed to drive home.    Special instructions:   [place]- Preparing For Surgery  Before surgery, you can play an important role.  Because skin is not sterile, your skin needs to be as free of germs as possible. You can reduce the number of germs on your skin by washing with CHG (chlorahexidine gluconate) Soap before surgery.  CHG is an antiseptic cleaner which kills germs and bonds with the skin to continue killing germs even after washing.    Oral Hygiene is also important to reduce your risk of infection.  Remember - BRUSH YOUR TEETH THE MORNING OF SURGERY WITH YOUR REGULAR TOOTHPASTE  Please do not use if you have an allergy to CHG or antibacterial soaps. If your skin becomes reddened/irritated stop using the CHG.  Do not shave (including legs and underarms) for at least 48 hours prior to first CHG shower. It is OK to shave your face.  Please follow these instructions carefully.   1. Shower the NIGHT BEFORE SURGERY and the MORNING OF SURGERY with CHG Soap.   2. If you chose to wash your hair, wash your hair first as usual with your normal shampoo.  3. After you shampoo, rinse your hair and body thoroughly to remove the shampoo.  4. Use CHG as you would any other liquid soap. You can apply CHG directly to the skin and wash gently with a scrungie or a clean washcloth.   5. Apply the CHG Soap to your body ONLY FROM THE NECK DOWN.  Do not use on open wounds or open sores. Avoid contact with your eyes,  ears, mouth and genitals (private parts). Wash Face and genitals (private parts)  with your normal soap.   6. Wash thoroughly, paying special attention to the area where your surgery will be performed.  7. Thoroughly rinse your body with warm water from the neck down.  8. DO NOT shower/wash with your normal soap after using and rinsing off the CHG Soap.  9. Pat yourself dry with a CLEAN TOWEL.  10. Wear CLEAN PAJAMAS to bed the night before surgery, wear comfortable clothes the morning of surgery  11. Place CLEAN SHEETS on your bed the night of your first shower and DO NOT SLEEP WITH PETS.    Day of Surgery:  Do  not apply any deodorants/lotions.  Please wear clean clothes to the hospital/surgery center.   Remember to brush your teeth WITH YOUR REGULAR TOOTHPASTE.   Please read over the following fact sheets that you were given.

## 2018-09-17 ENCOUNTER — Other Ambulatory Visit: Payer: Self-pay

## 2018-09-17 ENCOUNTER — Encounter (HOSPITAL_COMMUNITY)
Admission: RE | Admit: 2018-09-17 | Discharge: 2018-09-17 | Disposition: A | Discharge status: Home / Self Care | Payer: Medicaid Other | Source: Ambulatory Visit | Attending: Otolaryngology | Admitting: Otolaryngology

## 2018-09-17 DIAGNOSIS — Z01818 Encounter for other preprocedural examination: Secondary | ICD-10-CM | POA: Diagnosis not present

## 2018-09-17 HISTORY — DX: Elevated blood-pressure reading, without diagnosis of hypertension: R03.0

## 2018-09-17 LAB — CBC
HCT: 45.7 % (ref 39.0–52.0)
Hemoglobin: 14.8 g/dL (ref 13.0–17.0)
MCH: 30.1 pg (ref 26.0–34.0)
MCHC: 32.4 g/dL (ref 30.0–36.0)
MCV: 92.9 fL (ref 80.0–100.0)
Platelets: 304 10*3/uL (ref 150–400)
RBC: 4.92 MIL/uL (ref 4.22–5.81)
RDW: 15.8 % — ABNORMAL HIGH (ref 11.5–15.5)
WBC: 6.6 10*3/uL (ref 4.0–10.5)
nRBC: 0 % (ref 0.0–0.2)

## 2018-09-17 LAB — BASIC METABOLIC PANEL
Anion gap: 11 (ref 5–15)
BUN: 5 mg/dL — ABNORMAL LOW (ref 6–20)
CO2: 24 mmol/L (ref 22–32)
Calcium: 9.5 mg/dL (ref 8.9–10.3)
Chloride: 105 mmol/L (ref 98–111)
Creatinine, Ser: 0.97 mg/dL (ref 0.61–1.24)
GFR calc Af Amer: >60 mL/min (ref >60)
GFR calc non Af Amer: >60 mL/min (ref >60)
Glucose, Bld: 96 mg/dL (ref 70–99)
Potassium: 3.7 mmol/L (ref 3.5–5.1)
Sodium: 140 mmol/L (ref 135–145)

## 2018-09-17 NOTE — Progress Notes (Signed)
  Coronavirus Screening  Pt scheduled for COVID test  at [facility] on 09-21-18 Have you experienced the following symptoms:  Cough yes/no: No Fever (>100.40F)  yes/no: No Runny nose yes/no: No Sore throat yes/no: No Difficulty breathing/shortness of breath  yes/no: No Loss of smell or taste.No Have you or a family member traveled in the last 14 days and where? yes/no: No  PCP - Dr. Velna Ochs  Cardiologist - denies  Chest x-ray - NA  EKG - 09-17-18  Stress Test - denies  ECHO - denies  Cardiac Cath - denies  AICD-denies PM-denies LOOP-denies  Sleep Study - denies CPAP -   LABS-CBC,BMP  ASA-denies  ERAS-NA  HA1C-NA Fasting Blood Sugar -  Checks Blood Sugar _____ times a day  Pt hypertensive on admission with BP 194/116. Repeat BP=185/117 checked manually. Pt attributes it to white coat syndrome. Pt states his wife checked his BP a few months back and it was fine. Last visit with his PCP was a televisit.Pt has a televisit scheduled prior to surgery. Allison,PA made aware. Repeat BP =178/98.  , 162/182mmHg  Anesthesia-y. HTN (?undiagnosed)  Pt denies having chest pain, sob, or fever at this time. All instructions explained to the pt, with a verbal understanding of the material. Pt agrees to go over the instructions while at home for a better understanding. The opportunity to ask questions was provided.

## 2018-09-17 NOTE — Progress Notes (Addendum)
Anesthesia PAT Evaluation:  Case: [id number] Date/Time: 09/25/18 0824   Procedure: TRACHEAL ESOPHAGEAL PROSTHESIS (TEP) CHANGE (N/A )   Anesthesia type: General   Pre-op diagnosis: Laryngeal Cancer   Location: [facility] OR ROOM 09 / [facility]   Surgeon: Izora Gala, MD      DISCUSSION: Patient is a 58 year old male scheduled for the above procedure.  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  According to ENT note, patient has been working with [facility] and has had great progress with electrolarynex, but is very interested in TEP (transesophageal voice prosthesis) which will require stoma plasty first--which was done on 06/05/18.  History includes laryngeal cancer (s/p chemotherapy 06/09/16-08/22/16; s/p radiation 09/11/16-10/30/16; s/p tracheostomy 04/24/16-05/04/17; s/p total laryngectomy 08/10/17 for recurrence), hyperlipidemia, radiation induced hypothyroidism, GERD, former smoker (quit 2017).  BP reading in PAT:  194/116 185/117 178/98 162/110 - Wife is not with him today, but reportedly periodically checks his BP at home. Last about 1 months ago and results was "fine." He says he has "white coat" hypertension.  Patient without chest pain, SOB, dizziness, edema. Reports since his surgeries for laryngeal cancer he tends to feel more fatigued with activities (and attributes this to his tracheostomy), but says he can go up 1-2 flights of stairs. Exam shows Heart RRR, no murmur noted. Lungs clear. No ankle edema.    Patient aware that a significantly elevated BP could result in case delay or cancellation. He seemed frustrated, stating that his BP is always known to be high at medical appointments but is fine at home so has not been prescribed anti-hypertensives. He didn't want to talk with me further about his BP. He is asymptomatic. I advised his to start a home BP log (as no home BP check in about a month). He did not want to wait for me to contact his PCP Velna Ochs, MD for any  additional instructions, but he is aware that I will be reaching out to her for recommendations.   I was able to communicate with Dr. Philipp Ovens this afternoon (09/17/18). She confirms that he has had intermittently elevated BP readings. In 12/2017 she had him do a 2 week home BP log after an office reading of 154/104. At his 12/31/17 follow-up visit his BP was 121/85 and all readings on his home BP log were normal (<130/<90) aside from one reading with SBP in the 140s. At that time, she felt that patient's elevated readings were likely due to white coat hypertension. With elevated readings today and upcoming surgery, she agreed with home BP log and will see about moving his 09/23/18 appointment up to 09/19/18. I've left a voice message regarding above with Olivia Mackie at Dr. Janeice Robinson office.       Last TSH 08/07/18 elevated at 13.120, but decreased from 101.800 on 05/30/18. Levothyroxine increased from 125 mcg to 150 mcg daily. He had PCP follow-up on 09/23/18.    Will leave chart for follow-up regarding PCP input following upcoming visit. Pre-surgical COVID testing is scheduled for 09/21/18.   ADDENDUM 09/23/18 4:41 PM: Patient was re-evaluated by [facility] on 09/20/18 and 09/23/18.  09/20/18 BP 171/110, 162/110; HR 99-100 09/23/18 BP 155/107, HR 104 Due to mild tachycardia, a STAT TSH was ordered which was normal at 1.059 (down from 13.120 on 08/07/18 following medication adjustment). Dr. Philipp Ovens documents that recent home BP measurements were mostly < 130/90, but with two intermittent readings in the 128'N systolic. Elevated office readings felt due to [facility]  Coat Hypertension. He was advised to continue a home BP log, but no pharmacologic treatment recommended at this time.   Dr. Philipp Ovens is aware of upcoming surgery plans. On 09/23/18, she wrote, "TSH within normal limits. Elevated readings in clinic are consistent with his known white coat hypertension. Although he did have two elevated readings at home, I do not  think this warrants starting antihypertensive therapy since he has otherwise been normotensive. His chronic knee pain could possibly be contributing. He received bilateral steroid injections today. Instructed patient to continue monitoring home BP prior to surgery. If elevated during procedure, would be reasonable to use intermittent PRNs. No indication for antihypertensive therapy at this time."    I updated anesthesiologist Annye Asa, MD regarding recent elevated BP readings felt likely due to [facility] Coat Hypertension and not started on anti-hypertensive medication. PCP advised PRN medication if needed perioperatively. Could consider Valium on the day of surgery to see if that helps. Definitive plan on the day of surgery following vitals and anesthesiologist evaluation.   VS: BP (!) 162/110   Pulse 100   Temp 37.2 C   Resp 18   Ht 5' 5.5" (1.664 m)   Wt 67.1 kg   SpO2 97%   BMI 24.25 kg/m    PROVIDERS: Velna Ochs, MD is PCP ([place]; [phone]) Heath Lark, MD is HEM-ONC. Last visit 6/1/120. One year follow-up planned.   LABS: Labs reviewed: Acceptable for surgery. (all labs ordered are listed, but only abnormal results are displayed)  Labs Reviewed  BASIC METABOLIC PANEL - Abnormal; Notable for the following components:      Result Value   BUN 5 (*)    All other components within normal limits  CBC - Abnormal; Notable for the following components:   RDW 15.8 (*)    All other components within normal limits     EKG: 09/17/18: NSR at 93 BPM   CV: N/A   Past Medical History:  Diagnosis Date  . Allergic rhinitis   . Arthritis    Bilateral knee arthritis for about 20 years. Not taking any medications, believes in nature processes  . Cancer (HCC)    larynx  . Colon polyp   . Dental caries    prechemoradiation therapy, periodontitis  . GERD (gastroesophageal reflux disease)   . Headache   . History of radiation therapy  09/11/16- 10/30/16   Larynx 70 Gy in 35 fractions  . HLD (hyperlipidemia)   . Hypothyroidism    radiation induced hypothyroidism  . Pneumonia    as a child    Past Surgical History:  Procedure Laterality Date  . COLONOSCOPY  03/18/2012  . DIRECT LARYNGOSCOPY N/A 07/06/2017   Procedure: DIRECT LARYNGOSCOPY;  Surgeon: Izora Gala, MD;  Location: [facility] [place];  Service: ENT;  Laterality: N/A;  . ESOPHAGOSCOPY  04/24/2016   Procedure: ESOPHAGOSCOPY;  Surgeon: Izora Gala, MD;  Location: [facility];  Service: ENT;;  . IR GENERIC HISTORICAL  06/02/2016   IR US GUIDE VASC ACCESS RIGHT 06/02/2016 Sandi Mariscal, MD [facility]  . IR GENERIC HISTORICAL  06/02/2016   IR FLUORO GUIDE PORT INSERTION RIGHT 06/02/2016 Sandi Mariscal, MD [facility]  . IR REMOVAL TUN ACCESS W/ PORT W/O FL MOD SED  04/16/2018  . LARYNGETOMY N/A 08/10/2017   Procedure: TOTAL LARYNGECTOMY;  Surgeon: Izora Gala, MD;  Location: [facility];  Service: ENT;  Laterality: N/A;  . LARYNGOSCOPY  04/24/2016   Procedure: LARYNGOSCOPY;  Surgeon: Izora Gala, MD;  Location: [facility];  Service: ENT;;  . MULTIPLE EXTRACTIONS WITH ALVEOLOPLASTY N/A 05/26/2016   Procedure: Extraction of tooth #'s 2,5,6,8-13, 21-28 with alveoloplasty, bilateral mandibular tori reductions and buccal exostoses reductions of maxillary left, maxillary right, and mandibular left quadrants.;  Surgeon: Lenn Cal, DDS;  Location: [place];  Service: Oral Surgery;  Laterality: N/A;  . STOMAPLASTY N/A 06/05/2018   Procedure: Tracheal STOMAPLASTY;  Surgeon: Izora Gala, MD;  Location: [place] [facility];  Service: ENT;  Laterality: N/A;  . TRACHEOSTOMY TUBE PLACEMENT N/A 04/24/2016   Procedure: TRACHEOSTOMY;  Surgeon: Izora Gala, MD;  Location: [place];  Service: ENT;  Laterality: N/A;    MEDICATIONS: . fluticasone (FLONASE ALLERGY RELIEF) 50 MCG/ACT nasal spray  . levothyroxine (SYNTHROID) 150 MCG tablet  . naproxen (NAPROSYN) 500 MG tablet   No current facility-administered medications for this  encounter.     Myra Gianotti, PA-C Surgical Short Stay/Anesthesiology [facility] Phone [phone] [facility] Phone [phone] 09/17/2018 5:11 PM

## 2018-09-20 ENCOUNTER — Other Ambulatory Visit: Payer: Self-pay

## 2018-09-20 ENCOUNTER — Ambulatory Visit (INDEPENDENT_AMBULATORY_CARE_PROVIDER_SITE_OTHER): Payer: Medicaid Other | Admitting: Internal Medicine

## 2018-09-20 VITALS — BP 171/110 | HR 99 | Temp 98.8°F | Ht 65.0 in | Wt 145.4 lb

## 2018-09-20 DIAGNOSIS — R030 Elevated blood-pressure reading, without diagnosis of hypertension: Secondary | ICD-10-CM

## 2018-09-20 NOTE — Patient Instructions (Addendum)
Thank you for allowing [place] to care for you  For your elevated blood pressure readings - This is due to "white-coat hypertension", where your blood pressure goes up in the doctor's office - We will continue to monitor this and document it in your chart   Follow up for regularly scheduled visits

## 2018-09-20 NOTE — Progress Notes (Signed)
   CC: Elevated blood pressure reading  HPI:  Jonathan [name] is a 58 y.o. M with PMHx listed below presenting for Elevated blood pressure reading. Please see the A&P for the status of the patient's chronic medical problems.  Past Medical History:  Diagnosis Date  . Allergic rhinitis   . Arthritis    Bilateral knee arthritis for about 20 years. Not taking any medications, believes in nature processes  . Cancer (HCC)    larynx  . Colon polyp   . Dental caries    prechemoradiation therapy, periodontitis  . GERD (gastroesophageal reflux disease)   . Headache   . History of radiation therapy 09/11/16- 10/30/16   Larynx 70 Gy in 35 fractions  . HLD (hyperlipidemia)   . Hypothyroidism    radiation induced hypothyroidism  . Pneumonia    as a child   Review of Systems:  Performed and all others negative.  Physical Exam:   Vitals:   09/20/18 0946  BP: (!) 158/110  Pulse: (!) 108  Temp: 98.8 F (37.1 C)  TempSrc: Oral  SpO2: 99%  Weight: 145 lb 6.4 oz (66 kg)  Height: 5\' 5"  (1.651 m)   Physical Exam Constitutional:      General: He is not in acute distress.    Appearance: Normal appearance.  Cardiovascular:     Rate and Rhythm: Normal rate and regular rhythm.     Pulses: Normal pulses.     Heart sounds: Normal heart sounds.  Pulmonary:     Effort: Pulmonary effort is normal. No respiratory distress.     Breath sounds: Normal breath sounds.  Abdominal:     General: Bowel sounds are normal. There is no distension.     Palpations: Abdomen is soft.     Tenderness: There is no abdominal tenderness.  Musculoskeletal:        General: No swelling or deformity.  Skin:    General: Skin is warm and dry.  Neurological:     General: No focal deficit present.     Mental Status: Mental status is at baseline.    Assessment & Plan:   See Encounters Tab for problem based charting.  Patient discussed with Dr. Lynnae January

## 2018-09-20 NOTE — Assessment & Plan Note (Addendum)
Patient is presenting today for evaluation after elevated blood pressure reading during pre-operative evaluation by anesthesiology. He has a history of white coat hypertension with blood pressures to the 902X systolic in our office, and BP os 158/110 today. However, he also has blood pressures in the <130s/<90s as rcently as December 2019 with his PCP. HE reports Bps up to 115Z systolic at home.  Due to these recent normal readings and history of white coat hypertension; I believe his elevated blood pressure readings are due to this known white coat hypertension and will not start therapy at this time due to risk of dropping his blood pressure too low at times when it is not artificially elevated. He states he has previously been given a dose of antihypertensive medicine by the surgical team taking care of him prior to his procedure; this would appear to be a reasonable approach for him, but that decision will be made by the team caring for him on the day of his operation. - Continue to monitor - BP control as needed during surgical intervention reasonable

## 2018-09-21 ENCOUNTER — Other Ambulatory Visit (HOSPITAL_COMMUNITY)
Admission: RE | Admit: 2018-09-21 | Discharge: 2018-09-21 | Disposition: A | Discharge status: Home / Self Care | Payer: Medicaid Other | Source: Ambulatory Visit | Attending: Otolaryngology | Admitting: Otolaryngology

## 2018-09-21 DIAGNOSIS — Z1159 Encounter for screening for other viral diseases: Secondary | ICD-10-CM | POA: Diagnosis not present

## 2018-09-22 LAB — SARS CORONAVIRUS 2 (TAT 6-24 HRS): SARS Coronavirus 2: NEGATIVE

## 2018-09-23 ENCOUNTER — Ambulatory Visit: Payer: Medicaid Other | Admitting: Internal Medicine

## 2018-09-23 ENCOUNTER — Encounter: Payer: Self-pay | Admitting: Internal Medicine

## 2018-09-23 ENCOUNTER — Other Ambulatory Visit: Payer: Self-pay

## 2018-09-23 ENCOUNTER — Encounter (HOSPITAL_COMMUNITY): Payer: Self-pay

## 2018-09-23 VITALS — BP 155/107 | HR 104 | Temp 99.0°F | Ht 65.0 in | Wt 144.9 lb

## 2018-09-23 DIAGNOSIS — R030 Elevated blood-pressure reading, without diagnosis of hypertension: Secondary | ICD-10-CM

## 2018-09-23 DIAGNOSIS — E039 Hypothyroidism, unspecified: Secondary | ICD-10-CM | POA: Diagnosis present

## 2018-09-23 DIAGNOSIS — M25561 Pain in right knee: Secondary | ICD-10-CM | POA: Diagnosis not present

## 2018-09-23 DIAGNOSIS — G8929 Other chronic pain: Secondary | ICD-10-CM | POA: Diagnosis not present

## 2018-09-23 DIAGNOSIS — M25562 Pain in left knee: Secondary | ICD-10-CM | POA: Diagnosis not present

## 2018-09-23 DIAGNOSIS — Z430 Encounter for attention to tracheostomy: Secondary | ICD-10-CM | POA: Diagnosis not present

## 2018-09-23 DIAGNOSIS — E032 Hypothyroidism due to medicaments and other exogenous substances: Secondary | ICD-10-CM | POA: Diagnosis not present

## 2018-09-23 LAB — TSH: TSH: 1.059 u[IU]/mL (ref 0.350–4.500)

## 2018-09-23 NOTE — Patient Instructions (Signed)
Jonathan [name],  It was a pleasure to see you today. I will call you with the results of your thyroid testing later today or tomorrow. Please follow up with me again in 3 months or sooner if you have any problems. If you have any questions or concerns, call our clinic at [phone] or after hours call [phone] and ask for the internal medicine resident on call. Thank you!  Dr. Philipp Ovens

## 2018-09-23 NOTE — Anesthesia Preprocedure Evaluation (Addendum)
Anesthesia Evaluation  Patient identified by MRN, date of birth, ID band Patient awake    Reviewed: Allergy & Precautions, NPO status , Patient's Chart, lab work & pertinent test results  Airway Mallampati: II  TM Distance: >3 FB Neck ROM: Full    Dental no notable dental hx.    Pulmonary neg pulmonary ROS, former smoker,    Pulmonary exam normal breath sounds clear to auscultation       Cardiovascular hypertension, Normal cardiovascular exam Rhythm:Regular Rate:Normal     Neuro/Psych negative neurological ROS  negative psych ROS   GI/Hepatic Neg liver ROS, GERD  ,  Endo/Other  Hypothyroidism   Renal/GU negative Renal ROS  negative genitourinary   Musculoskeletal negative musculoskeletal ROS (+)   Abdominal   Peds negative pediatric ROS (+)  Hematology negative hematology ROS (+)   Anesthesia Other Findings   Reproductive/Obstetrics negative OB ROS                            Anesthesia Physical Anesthesia Plan  ASA: III  Anesthesia Plan: General   Post-op Pain Management:    Induction: Intravenous  PONV Risk Score and Plan: Ondansetron, Dexamethasone and Treatment may vary due to age or medical condition  Airway Management Planned: Oral ETT  Additional Equipment:   Intra-op Plan:   Post-operative Plan: Extubation in OR  Informed Consent: I have reviewed the patients History and Physical, chart, labs and discussed the procedure including the risks, benefits and alternatives for the proposed anesthesia with the patient or authorized representative who has indicated his/her understanding and acceptance.     Dental advisory given  Plan Discussed with: CRNA and Surgeon  Anesthesia Plan Comments: ( History of White Coat Hypertension. Evaluated at PCP office 09/20/18 and 09/23/18 and no routine antihypertensive medications prescribed--advised surgical team could treat as needed,  if indicated. See PAT note written by Myra Gianotti, PA-C and 09/23/18 PCP note by Velna Ochs, MD. .)       Anesthesia Quick Evaluation

## 2018-09-23 NOTE — Progress Notes (Signed)
   CC: Knee pain, elevated BP readings   HPI:  [name] is a 58 y.o. male with past medical history outlined below here for knee pain and elevated BP readings. For the details of today's visit, please refer to the assessment and plan.  Past Medical History:  Diagnosis Date  . Allergic rhinitis   . Arthritis    Bilateral knee arthritis for about 20 years. Not taking any medications, believes in nature processes  . Cancer (HCC)    larynx  . Colon polyp   . Dental caries    prechemoradiation therapy, periodontitis  . GERD (gastroesophageal reflux disease)   . Headache   . History of radiation therapy 09/11/16- 10/30/16   Larynx 70 Gy in 35 fractions  . HLD (hyperlipidemia)   . Hypothyroidism    radiation induced hypothyroidism  . Pneumonia    as a child    Review of Systems  Respiratory: Negative for shortness of breath.   Cardiovascular: Negative for chest pain and palpitations.    Physical Exam:  Vitals:   09/23/18 1312  BP: (!) 155/107  Pulse: (!) 104  Temp: 99 F (37.2 C)  TempSrc: Oral  SpO2: 99%  Weight: 144 lb 14.4 oz (65.7 kg)  Height: 5\' 5"  (1.651 m)    Constitutional: NAD, appears comfortable HEENT: Stoma with tracheostomy plug, no erythema or drainage  Cardiovascular: Tachycardic but regular, no m/r/g Pulmonary/Chest: CTAB, no wheezes, rales, or rhonchi.  Extremities: Warm and well perfused. No edema.  Psychiatric: Normal mood and affect  Assessment & Plan:   See Encounters Tab for problem based charting.  Patient discussed with Dr. Lynnae January

## 2018-09-23 NOTE — Assessment & Plan Note (Signed)
Patient has bilateral knee pain due to osteoarthritis that is well controlled with intermittent intraarticular steroid injections. His last injections were 6 months ago and provided lasting relief until a few weeks ago. Requesting repeat injections today.   Procedure Note  Indication:Bilateral Knee Osteoarthritis  Operators: DrsGuilloud & Hoffman  The patient was provided with risks, benefits, and alternatives to intraarticular injection. He consented to intraarticular knee injectionsfor bilateral knee pain due to osteoarthritis. After a time out was preformed, the knee was prepped in a sterile fashion. A mixture of 1 cc 1% lidocaine and 40 mg of Kenalog was injected into the knee using a 25gauge  1 1/2 inch needle. The needle was inserted at the lateral inferior patellar aspect of the knee whilepatient wasin aseated position.Bothknee spaces wereenteredwithoutdifficulty, entered successfully after 1attempt. The patient tolerated the procedures well without complication.

## 2018-09-23 NOTE — Assessment & Plan Note (Addendum)
Patient has a history of white coat hypertension confirmed with home BP monitoring now on two occasions. He was last seen for follow up three days ago. Home BP measurements at that time were all normal, <130/90. However continues to be elevated here in our office. Wife reports over the past two days however he has had two intermittent readings in the 947S systolic and she has noticed new, persistent tachycardia. HR does not drop below 100. HR here today is 104. I am concerned he may be supra therapeutic on his synthroid. Will check STAT TSH.  -- Continue home BP log  -- f/u TSH  -- No treatment for now   ADDENDUM: TSH within normal limits. Elevated readings in clinic are consistent with his known white coat hypertension. Although he did have two elevated readings at home, I do not think this warrants starting antihypertensive therapy since he has otherwise been normotensive. His chronic knee pain could possibly be contributing. He received bilateral steroid injections today. Instructed patient to continue monitoring home BP prior to surgery. If elevated during procedure, would be reasonable to use intermittent PRNs. No indication for antihypertensive therapy at this time.

## 2018-09-23 NOTE — Assessment & Plan Note (Addendum)
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 He was started on Synthroid approximately 6 months ago. We have been titrating his dose, last TSH elevated ~ 13 and his synthroid was increased to 150 mcg. Patient has been monitoring his BP at home due to intermittently elevated readings. He has a known diagnosis of white coat hypertension, however wife notes persistent tachycardia at home. I am concerned he may be supratherapeutic. He is scheduled for tracheal esophageal prothesis exchange with ENT in 2 days. Will check a STAT TSH today. If significantly reduced, may need to delay surgery.  -- STAT TSH  ADDENDUM: TSH within normal range. Called patient with results - continue current dose 150 mcg of synthroid. Unclear reason for tachycardia. His chronic pain could possibly be contributing. He received bilateral knee injections today. Will continue to monitor.

## 2018-09-25 ENCOUNTER — Encounter (HOSPITAL_COMMUNITY)
Admission: RE | Disposition: A | Discharge status: Home / Self Care | Payer: Self-pay | Source: Home / Self Care | Attending: Otolaryngology

## 2018-09-25 ENCOUNTER — Ambulatory Visit (HOSPITAL_COMMUNITY): Payer: Medicaid Other | Admitting: Vascular Surgery

## 2018-09-25 ENCOUNTER — Ambulatory Visit (HOSPITAL_COMMUNITY): Payer: Medicaid Other | Admitting: Certified Registered Nurse Anesthetist

## 2018-09-25 ENCOUNTER — Ambulatory Visit (HOSPITAL_COMMUNITY)
Admission: RE | Admit: 2018-09-25 | Discharge: 2018-09-25 | Disposition: A | Discharge status: Home / Self Care | Payer: Medicaid Other | Attending: Otolaryngology | Admitting: Otolaryngology

## 2018-09-25 ENCOUNTER — Encounter (HOSPITAL_COMMUNITY): Payer: Self-pay

## 2018-09-25 ENCOUNTER — Other Ambulatory Visit: Payer: Self-pay

## 2018-09-25 DIAGNOSIS — Z9002 Acquired absence of larynx: Secondary | ICD-10-CM | POA: Insufficient documentation

## 2018-09-25 DIAGNOSIS — Y842 Radiological procedure and radiotherapy as the cause of abnormal reaction of the patient, or of later complication, without mention of misadventure at the time of the procedure: Secondary | ICD-10-CM | POA: Diagnosis not present

## 2018-09-25 DIAGNOSIS — R491 Aphonia: Secondary | ICD-10-CM | POA: Insufficient documentation

## 2018-09-25 DIAGNOSIS — R490 Dysphonia: Secondary | ICD-10-CM | POA: Diagnosis not present

## 2018-09-25 DIAGNOSIS — E890 Postprocedural hypothyroidism: Secondary | ICD-10-CM | POA: Diagnosis not present

## 2018-09-25 DIAGNOSIS — Z79890 Hormone replacement therapy (postmenopausal): Secondary | ICD-10-CM | POA: Insufficient documentation

## 2018-09-25 DIAGNOSIS — Z8521 Personal history of malignant neoplasm of larynx: Secondary | ICD-10-CM | POA: Diagnosis not present

## 2018-09-25 DIAGNOSIS — E039 Hypothyroidism, unspecified: Secondary | ICD-10-CM | POA: Diagnosis not present

## 2018-09-25 DIAGNOSIS — I10 Essential (primary) hypertension: Secondary | ICD-10-CM | POA: Diagnosis not present

## 2018-09-25 DIAGNOSIS — C329 Malignant neoplasm of larynx, unspecified: Secondary | ICD-10-CM | POA: Diagnosis not present

## 2018-09-25 DIAGNOSIS — D61818 Other pancytopenia: Secondary | ICD-10-CM | POA: Diagnosis not present

## 2018-09-25 DIAGNOSIS — Z923 Personal history of irradiation: Secondary | ICD-10-CM | POA: Diagnosis not present

## 2018-09-25 DIAGNOSIS — Z87891 Personal history of nicotine dependence: Secondary | ICD-10-CM | POA: Diagnosis not present

## 2018-09-25 DIAGNOSIS — C321 Malignant neoplasm of supraglottis: Secondary | ICD-10-CM | POA: Diagnosis not present

## 2018-09-25 HISTORY — PX: TRACHEAL ESOPHAGEAL PROSTHESIS (TEP) CHANGE: SHX6131

## 2018-09-25 SURGERY — REPLACEMENT, VOICE PROSTHESIS, TRACHEOESOPHAGEAL
Anesthesia: General | Site: Mouth

## 2018-09-25 MED ORDER — FENTANYL CITRATE (PF) 250 MCG/5ML IJ SOLN
INTRAMUSCULAR | Status: AC
  Filled 2018-09-25: qty 5

## 2018-09-25 MED ORDER — ROCURONIUM BROMIDE 50 MG/5ML IV SOSY
PREFILLED_SYRINGE | INTRAVENOUS | Status: DC | PRN
  Administered 2018-09-25: 35 mg via INTRAVENOUS

## 2018-09-25 MED ORDER — PROPOFOL 10 MG/ML IV BOLUS
INTRAVENOUS | Status: DC | PRN
  Administered 2018-09-25: 50 mg via INTRAVENOUS
  Administered 2018-09-25: 180 mg via INTRAVENOUS

## 2018-09-25 MED ORDER — DEXAMETHASONE SODIUM PHOSPHATE 10 MG/ML IJ SOLN
INTRAMUSCULAR | Status: DC | PRN
  Administered 2018-09-25: 10 mg via INTRAVENOUS

## 2018-09-25 MED ORDER — MIDAZOLAM HCL 2 MG/2ML IJ SOLN
INTRAMUSCULAR | Status: AC
  Filled 2018-09-25: qty 2

## 2018-09-25 MED ORDER — SUCCINYLCHOLINE CHLORIDE 20 MG/ML IJ SOLN
INTRAMUSCULAR | Status: DC | PRN
  Administered 2018-09-25: 100 mg via INTRAVENOUS

## 2018-09-25 MED ORDER — MIDAZOLAM HCL 5 MG/5ML IJ SOLN
INTRAMUSCULAR | Status: DC | PRN
  Administered 2018-09-25: 2 mg via INTRAVENOUS

## 2018-09-25 MED ORDER — ONDANSETRON HCL 4 MG/2ML IJ SOLN
INTRAMUSCULAR | Status: AC
  Filled 2018-09-25: qty 2

## 2018-09-25 MED ORDER — FENTANYL CITRATE (PF) 100 MCG/2ML IJ SOLN
INTRAMUSCULAR | Status: DC | PRN
  Administered 2018-09-25: 50 ug via INTRAVENOUS

## 2018-09-25 MED ORDER — DEXAMETHASONE SODIUM PHOSPHATE 10 MG/ML IJ SOLN
INTRAMUSCULAR | Status: AC
  Filled 2018-09-25: qty 1

## 2018-09-25 MED ORDER — PROPOFOL 10 MG/ML IV BOLUS
INTRAVENOUS | Status: AC
  Filled 2018-09-25: qty 20

## 2018-09-25 MED ORDER — PHENYLEPHRINE 40 MCG/ML (10ML) SYRINGE FOR IV PUSH (FOR BLOOD PRESSURE SUPPORT)
PREFILLED_SYRINGE | INTRAVENOUS | Status: AC
  Filled 2018-09-25: qty 10

## 2018-09-25 MED ORDER — PROMETHAZINE HCL 25 MG/ML IJ SOLN: 6.2500 mg | INTRAMUSCULAR | Status: DC | PRN

## 2018-09-25 MED ORDER — LACTATED RINGERS IV SOLN
INTRAVENOUS | Status: DC | PRN
  Administered 2018-09-25: 09:00:00 via INTRAVENOUS

## 2018-09-25 MED ORDER — LIDOCAINE 2% (20 MG/ML) 5 ML SYRINGE
INTRAMUSCULAR | Status: DC | PRN
  Administered 2018-09-25: 100 mg via INTRAVENOUS

## 2018-09-25 MED ORDER — 0.9 % SODIUM CHLORIDE (POUR BTL) OPTIME
TOPICAL | Status: DC | PRN
  Administered 2018-09-25: 1000 mL

## 2018-09-25 MED ORDER — ONDANSETRON HCL 4 MG/2ML IJ SOLN
INTRAMUSCULAR | Status: DC | PRN
  Administered 2018-09-25: 4 mg via INTRAVENOUS

## 2018-09-25 MED ORDER — SUGAMMADEX SODIUM 200 MG/2ML IV SOLN
INTRAVENOUS | Status: DC | PRN
  Administered 2018-09-25: 200 mg via INTRAVENOUS

## 2018-09-25 MED ORDER — FENTANYL CITRATE (PF) 100 MCG/2ML IJ SOLN: 25.0000 ug | INTRAMUSCULAR | Status: DC | PRN

## 2018-09-25 SURGICAL SUPPLY — 33 items
BLADE CLIPPER SURG (BLADE) IMPLANT
BLADE SURG 15 STRL LF DISP TIS (BLADE) IMPLANT
BLADE SURG 15 STRL SS (BLADE)
CANISTER SUCT 3000ML PPV (MISCELLANEOUS) ×3 IMPLANT
CLEANER TIP ELECTROSURG 2X2 (MISCELLANEOUS) ×3 IMPLANT
COVER SURGICAL LIGHT HANDLE (MISCELLANEOUS) ×3 IMPLANT
COVER WAND RF STERILE (DRAPES) ×3 IMPLANT
DECANTER SPIKE VIAL GLASS SM (MISCELLANEOUS) IMPLANT
DRAPE HALF SHEET 40X57 (DRAPES) IMPLANT
ELECT COATED BLADE 2.86 ST (ELECTRODE) ×3 IMPLANT
ELECT REM PT RETURN 9FT ADLT (ELECTROSURGICAL) ×3
ELECTRODE REM PT RTRN 9FT ADLT (ELECTROSURGICAL) ×1 IMPLANT
GAUZE 4X4 16PLY RFD (DISPOSABLE) ×3 IMPLANT
GLOVE ECLIPSE 7.5 STRL STRAW (GLOVE) ×3 IMPLANT
GOWN STRL REUS W/ TWL LRG LVL3 (GOWN DISPOSABLE) ×2 IMPLANT
GOWN STRL REUS W/TWL LRG LVL3 (GOWN DISPOSABLE) ×4
KIT BASIN OR (CUSTOM PROCEDURE TRAY) ×3 IMPLANT
KIT TURNOVER KIT B (KITS) ×3 IMPLANT
NEEDLE PRECISIONGLIDE 27X1.5 (NEEDLE) IMPLANT
NS IRRIG 1000ML POUR BTL (IV SOLUTION) ×3 IMPLANT
PAD ARMBOARD 7.5X6 YLW CONV (MISCELLANEOUS) ×6 IMPLANT
PENCIL FOOT CONTROL (ELECTRODE) ×3 IMPLANT
SUCTION FRAZIER HANDLE 10FR (MISCELLANEOUS) ×2
SUCTION TUBE FRAZIER 10FR DISP (MISCELLANEOUS) ×1 IMPLANT
SUT CHROMIC 2 0 SH (SUTURE) IMPLANT
SUT CHROMIC 3 0 SH 27 (SUTURE) IMPLANT
SUT ETHILON 3 0 PS 1 (SUTURE) IMPLANT
SUT SILK 4 0 TIES 17X18 (SUTURE) ×3 IMPLANT
SYR 20CC LL (SYRINGE) ×3 IMPLANT
SYR CONTROL 10ML LL (SYRINGE) IMPLANT
TOWEL NATURAL 6PK STERILE (DISPOSABLE) ×3 IMPLANT
TRAY ENT MC OR (CUSTOM PROCEDURE TRAY) ×3 IMPLANT
WATER STERILE IRR 1000ML POUR (IV SOLUTION) ×3 IMPLANT

## 2018-09-25 NOTE — Interval H&P Note (Signed)
History and Physical Interval Note:  09/25/2018 8:53 AM  Jonathan [name]  has presented today for surgery, with the diagnosis of Laryngeal Cancer.  The various methods of treatment have been discussed with the patient and family. After consideration of risks, benefits and other options for treatment, the patient has consented to  Procedure(s): TRACHEAL ESOPHAGEAL PROSTHESIS (TEP) CHANGE (N/A) as a surgical intervention.  The patient's history has been reviewed, patient examined, no change in status, stable for surgery.  I have reviewed the patient's chart and labs.  Questions were answered to the patient's satisfaction.     Jonathan [name]

## 2018-09-25 NOTE — Transfer of Care (Signed)
Immediate Anesthesia Transfer of Care Note  Patient: Jonathan [name]  Procedure(s) Performed: TRACHEAL ESOPHAGEAL PROSTHESIS (TEP) CHANGE (N/A Mouth)  Patient Location: PACU  Anesthesia Type:General  Level of Consciousness: awake and alert   Airway & Oxygen Therapy: Patient Spontanous Breathing and Patient connected to nasal cannula oxygen  Post-op Assessment: Report given to RN and Post -op Vital signs reviewed and stable  Post vital signs: Reviewed and stable  Last Vitals:  Vitals Value Taken Time  BP 186/127 09/25/18 1012  Temp    Pulse 94 09/25/18 1020  Resp 32 09/25/18 1020  SpO2 98 % 09/25/18 1020  Vitals shown include unvalidated device data.  Last Pain:  Vitals:   09/25/18 0703  PainSc: 0-No pain      Patients Stated Pain Goal: 2 (56/38/93 7342)  Complications: No apparent anesthesia complications

## 2018-09-25 NOTE — Anesthesia Postprocedure Evaluation (Signed)
Anesthesia Post Note  Patient: Jonathan [name]  Procedure(s) Performed: TRACHEAL ESOPHAGEAL PROSTHESIS (TEP) CHANGE (N/A Mouth)     Patient location during evaluation: PACU Anesthesia Type: General Level of consciousness: awake and alert Pain management: pain level controlled Vital Signs Assessment: post-procedure vital signs reviewed and stable Respiratory status: spontaneous breathing, nonlabored ventilation, respiratory function stable and patient connected to nasal cannula oxygen Cardiovascular status: blood pressure returned to baseline and stable Postop Assessment: no apparent nausea or vomiting Anesthetic complications: no    Last Vitals:  Vitals:   09/25/18 1115 09/25/18 1127  BP: (!) 173/116 (!) 178/114  Pulse: 85 86  Resp: (!) 25 (!) 24  Temp: 36.7 C 36.7 C  SpO2: 97% 99%    Last Pain:  Vitals:   09/25/18 1127  PainSc: 2                  Geza Beranek S

## 2018-09-25 NOTE — Discharge Instructions (Signed)
Call if you have any problems.   Post Anesthesia Home Care Instructions  Activity: Get plenty of rest for the remainder of the day. A responsible individual must stay with you for 24 hours following the procedure.  For the next 24 hours, DO NOT: -Drive a car -Paediatric nurse -Drink alcoholic beverages -Take any medication unless instructed by your physician -Make any legal decisions or sign important papers.  Meals: Start with liquid foods such as gelatin or soup. Progress to regular foods as tolerated. Avoid greasy, spicy, heavy foods. If nausea and/or vomiting occur, drink only clear liquids until the nausea and/or vomiting subsides. Call your physician if vomiting continues.  Special Instructions/Symptoms: Your throat may feel dry or sore from the anesthesia or the breathing tube placed in your throat during surgery. If this causes discomfort, gargle with warm salt water. The discomfort should disappear within 24 hours.  If you had a scopolamine patch placed behind your ear for the management of post- operative nausea and/or vomiting:  1. The medication in the patch is effective for 72 hours, after which it should be removed.  Wrap patch in a tissue and discard in the trash. Wash hands thoroughly with soap and water. 2. You may remove the patch earlier than 72 hours if you experience unpleasant side effects which may include dry mouth, dizziness or visual disturbances. 3. Avoid touching the patch. Wash your hands with soap and water after contact with the patch.

## 2018-09-25 NOTE — Op Note (Signed)
OPERATIVE REPORT  DATE OF SURGERY: 09/25/2018  PATIENT:  Jonathan [name],  58 y.o. male  PRE-OPERATIVE DIAGNOSIS: History of laryngeal Cancer, status post total laryngectomy, aphonia  POST-OPERATIVE DIAGNOSIS:  History of laryngeal Cancer, status post total laryngectomy, aphonia  PROCEDURE:  Procedure(s): TRACHEAL ESOPHAGEAL PROSTHESIS (TEP) placement  SURGEON:  Beckie Salts, MD  ASSISTANTS: None  ANESTHESIA:   General   EBL: Less than 5 ml  DRAINS: None  LOCAL MEDICATIONS USED:  None  SPECIMEN:  none  COUNTS:  Correct  PROCEDURE DETAILS: The patient was taken to the operating room and placed on the operating table in the supine position. Following induction of general endotracheal anesthesia, via the laryngectomy stoma, the table was turned 90 degrees and the patient was draped in a standard fashion.  A rigid cervical esophagoscope was passed into the oral cavity through the pharynx and into the esophagus.  The bevel was turned up anteriorly towards the stoma and the light was identified within the superior aspect of the stoma.  The ATOS [employer] Vega introducer was placed approximately 5 mm from the mucosal cutaneous junction at the midline.  The guidewire was then passed through and retrieved from the oral cavity.  The endoscope was removed.  The prosthetic placement device was then attached and then passed through the cavity and withdrawn from the puncture site and seated properly.  The tab was cut off.  Suction was used to clear out secretions from the lumen of the prosthesis.  Everything was well seated.  There is no bleeding or swelling.  Patient was then awakened extubated and transferred to recovery in stable condition.Marland Kitchen    PATIENT DISPOSITION:  To PACU, stable

## 2018-09-25 NOTE — Anesthesia Procedure Notes (Signed)
Procedure Name: Intubation Date/Time: 09/25/2018 9:27 AM Performed by: Inda Coke, CRNA Pre-anesthesia Checklist: Patient identified, Emergency Drugs available, Suction available and Patient being monitored Patient Re-evaluated:Patient Re-evaluated prior to induction Oxygen Delivery Method: Circle System Utilized Preoxygenation: Pre-oxygenation with 100% oxygen Induction Type: IV induction Ventilation: Mask ventilation without difficulty Tube type: tracheal stoma. Tube size: 6.5 mm Number of attempts: 1 Placement Confirmation: positive ETCO2 and breath sounds checked- equal and bilateral ETT to lip (cm): taped to shoulder. Tube secured with: Tape Dental Injury: Teeth and Oropharynx as per pre-operative assessment  Comments: Oral ETT inserted through stoma

## 2018-09-25 NOTE — Progress Notes (Signed)
Internal Medicine Clinic Attending  Case discussed with Dr. Melvin  at the time of the visit.  We reviewed the resident's history and exam and pertinent patient test results.  I agree with the assessment, diagnosis, and plan of care documented in the resident's note.  

## 2018-09-26 ENCOUNTER — Encounter (HOSPITAL_COMMUNITY): Payer: Self-pay | Admitting: Otolaryngology

## 2018-10-03 DIAGNOSIS — Z930 Tracheostomy status: Secondary | ICD-10-CM | POA: Diagnosis not present

## 2018-10-03 DIAGNOSIS — C329 Malignant neoplasm of larynx, unspecified: Secondary | ICD-10-CM | POA: Diagnosis not present

## 2018-10-03 DIAGNOSIS — Z9002 Acquired absence of larynx: Secondary | ICD-10-CM | POA: Diagnosis not present

## 2018-10-03 DIAGNOSIS — Z923 Personal history of irradiation: Secondary | ICD-10-CM | POA: Diagnosis not present

## 2018-10-10 NOTE — Progress Notes (Signed)
Internal Medicine Clinic Attending  Case discussed with Dr. Dorna Mai at the time of the visit.  We reviewed the resident's history and exam and pertinent patient test results.  I agree with the assessment, diagnosis, and plan of care documented in the resident's note.

## 2018-10-23 ENCOUNTER — Other Ambulatory Visit: Payer: Self-pay | Admitting: *Deleted

## 2018-10-23 MED ORDER — LEVOTHYROXINE SODIUM 150 MCG PO TABS: 150.0000 ug | ORAL_TABLET | Freq: Every day | ORAL | 1 refills | Status: DC

## 2018-10-23 MED FILL — LEVOTHYROXINE 150 MCG TAB: 150 | 30 days supply | Qty: 30 | Fill #0

## 2018-10-28 DIAGNOSIS — Z9002 Acquired absence of larynx: Secondary | ICD-10-CM | POA: Diagnosis not present

## 2018-10-28 DIAGNOSIS — Z9221 Personal history of antineoplastic chemotherapy: Secondary | ICD-10-CM | POA: Diagnosis not present

## 2018-10-28 DIAGNOSIS — R490 Dysphonia: Secondary | ICD-10-CM | POA: Diagnosis not present

## 2018-10-28 DIAGNOSIS — Z930 Tracheostomy status: Secondary | ICD-10-CM | POA: Diagnosis not present

## 2018-10-28 DIAGNOSIS — C329 Malignant neoplasm of larynx, unspecified: Secondary | ICD-10-CM | POA: Diagnosis not present

## 2018-10-30 DIAGNOSIS — Z430 Encounter for attention to tracheostomy: Secondary | ICD-10-CM | POA: Diagnosis not present

## 2018-10-31 IMAGING — CT CT NECK W/ CM
3 of 5 series · 12 of 33 positions shown, 14 images · IV contrast (Omni 300)
Comparison: Chest radiographs 01/09/2014.

ADDENDUM:
Study discussed by telephone with Dr. Cristofer Alejandro on 04/18/2016 at 9554
hours.
CLINICAL DATA: 56-year-old male has been hoarse since June 2015.
Left neck soft tissue swelling. Former smoker, quit in [REDACTED].
Initial encounter.

EXAM:
CT NECK WITH CONTRAST
TECHNIQUE: Multidetector CT imaging of the neck was performed using the
standard protocol following the bolus administration of intravenous
contrast.
CONTRAST:  75mL 8N7YQL-9PP IOPAMIDOL (8N7YQL-9PP) INJECTION 61%

[Series 3: neck 2.0 st · axial · 0.46mm/px · z∈[-220,-72]mm · 4 of 124 slices shown, 5 images (1 of 3)]
[im 25/124  soft-tissue]
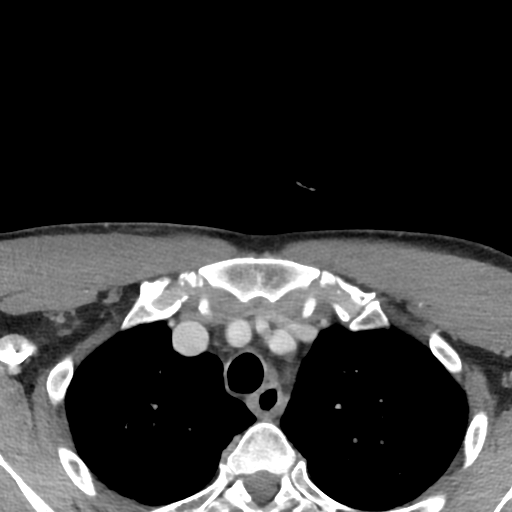
[im 25/124  bone]
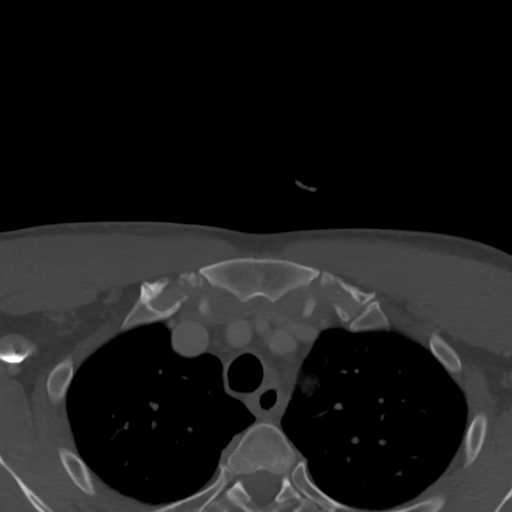
[im 50/124  bone]
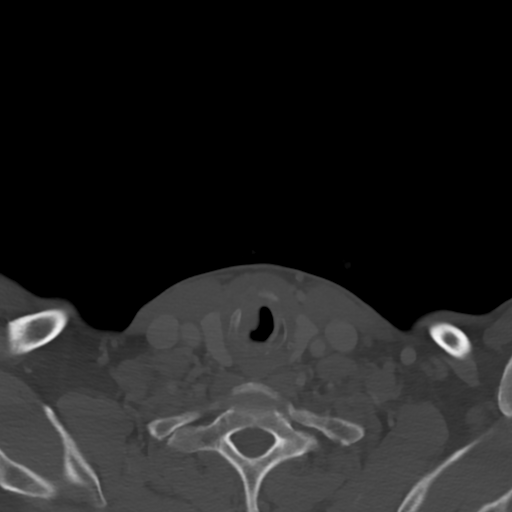
[im 74/124  bone]
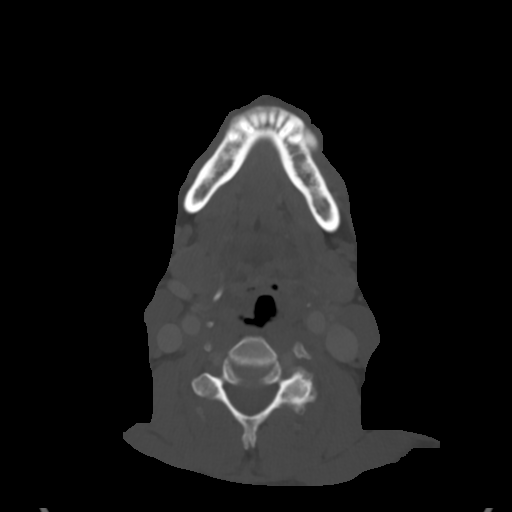
[im 99/124  bone]
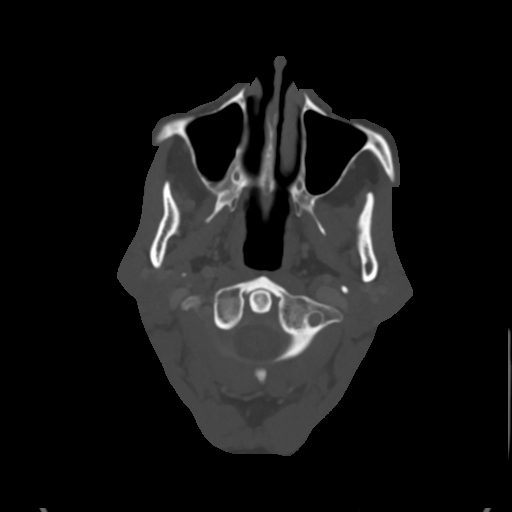

[Series 5: neck 2.0 st · sagittal · 0.43mm/px · 5 of 68 slices shown, 6 images (2 of 3)]
[im 23/68  bone]
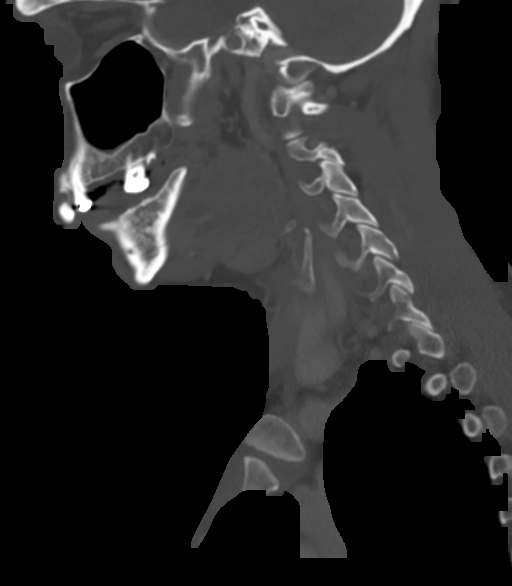
[im 28/68  bone]
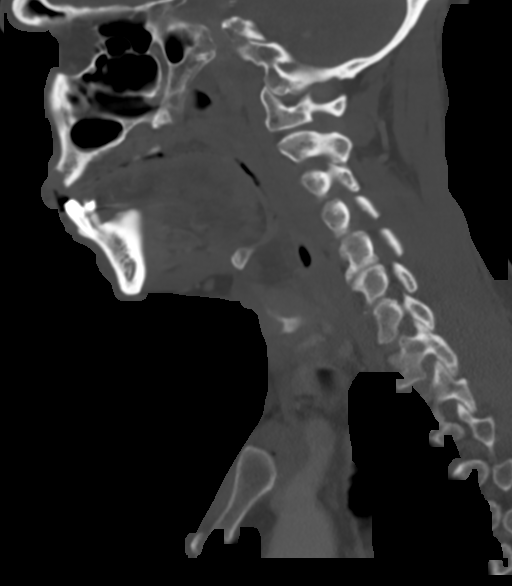
[im 34/68  soft-tissue]
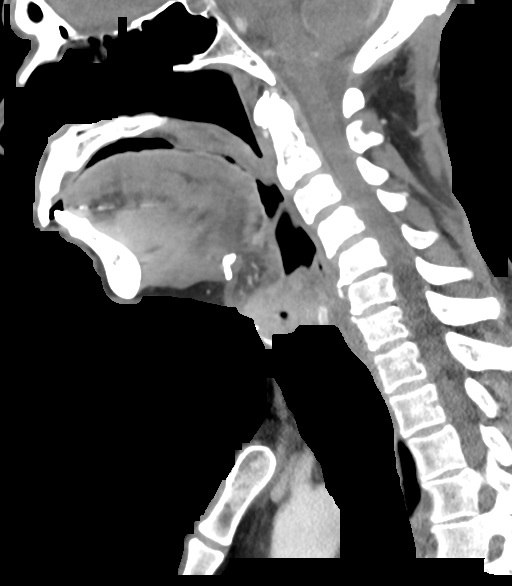
[im 34/68  bone]
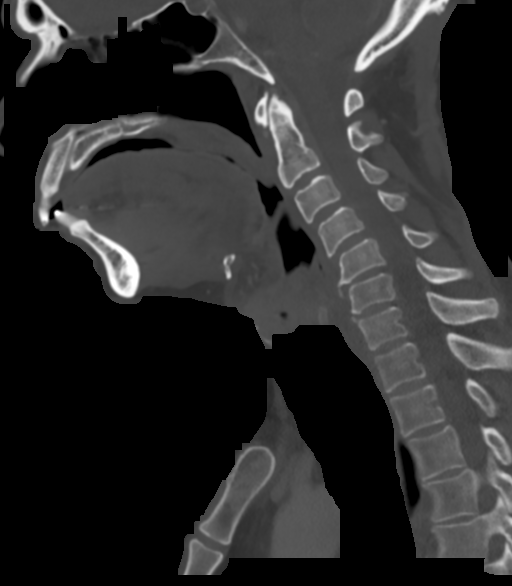
[im 40/68  bone]
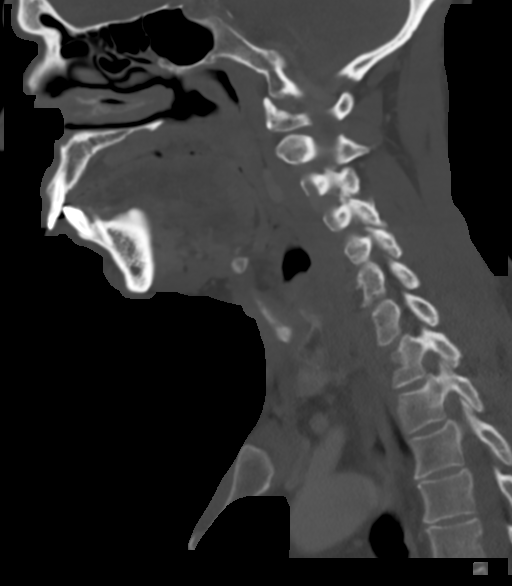
[im 45/68  bone]
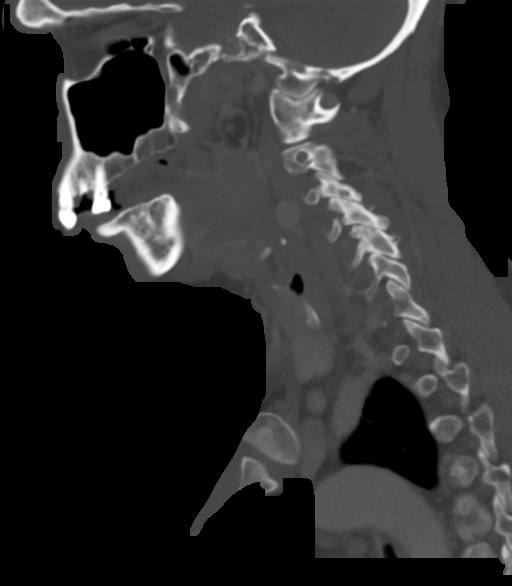

[Series 6: neck 2.0 st · coronal · 0.32mm/px · 3 of 99 slices shown (3 of 3)]
[im 30/99  bone]
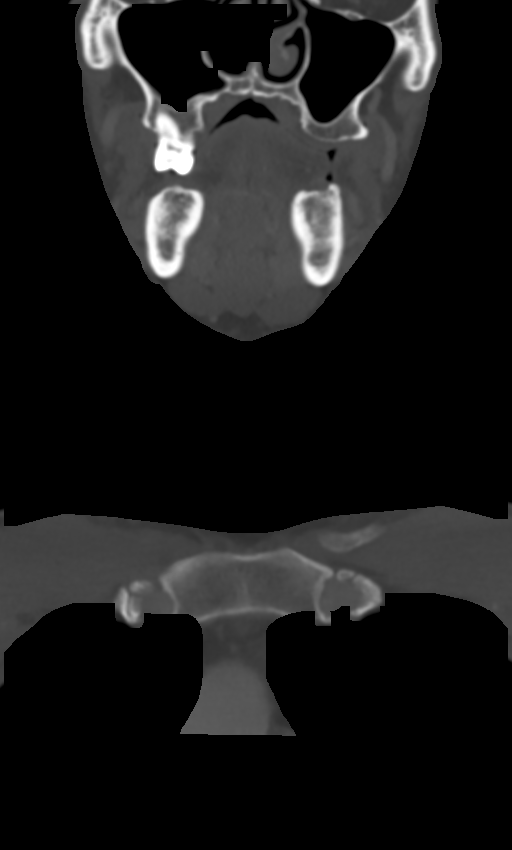
[im 43/99  bone]
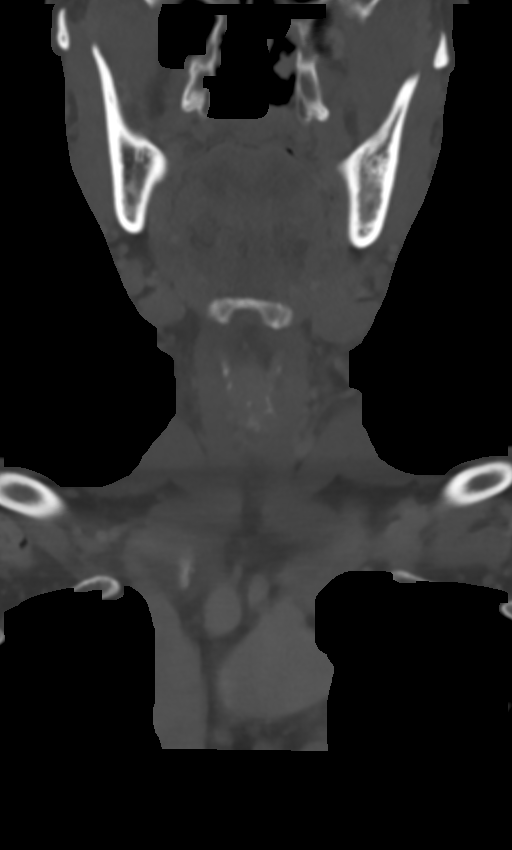
[im 56/99  bone]
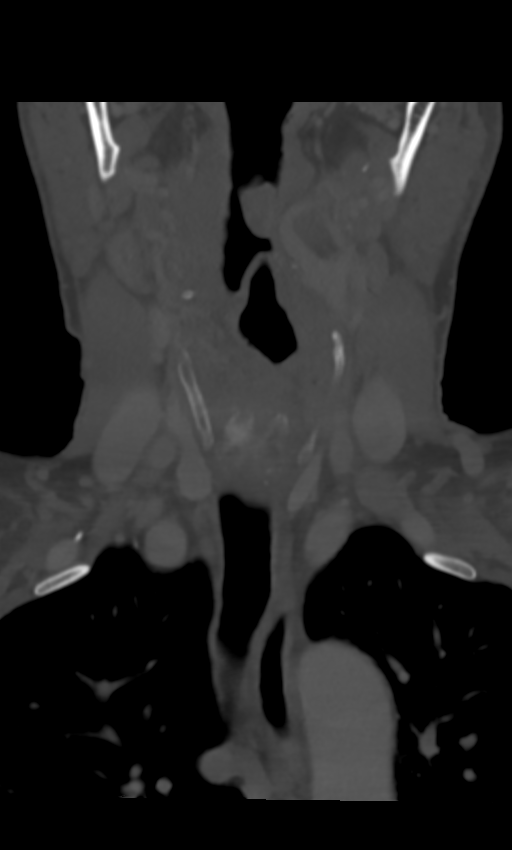

[12 of 33 positions shown; findings below may reference images not displayed]

FINDINGS: Pharynx and larynx: There is extensive soft tissue mass at the
larynx with indistinct margins. The epicenter appears to be the
right false cord, however, tumor appears to extend across midline
both at the anterior and posterior commissure (series 3, image 66).
There is tumor extension through the right anterior and possibly
also into the left anterior thyroid cartilage (series 3, image 70).
The right arytenoid cartilage is probably affected (image 67), and
the right cricoid cartilage might also be affected. The laryngeal
airway is completely effaced. Tumor does appear to marginally
involved the right piriform sinus. The left piriform sinus appears
spared. Overall tumor size is estimated at 35 x 31 x 31 mm (AP by
transverse by CC).

The lower right aryepiglottic fold is likely affected. The left 80
fold may be spared. The epiglottis is mildly thickened but felt to
be spared.

The hypopharynx otherwise is normal. The oropharynx and nasopharynx
are within normal limits. Negative parapharyngeal spaces. Negative
retropharyngeal space except for a partially retropharyngeal course
of the left internal carotid artery.

Salivary glands: Sublingual space, sublingual glands, submandibular
glands and parotid glands are within normal limits.

Thyroid: Negative.

Lymph nodes: There is a mildly enlarged and hyperenhancing right
level 2 lymph node measuring up to 11 mm short axis by 23 mm long
axis. See series 3, image 41 and sagittal image 15. There are small
but asymmetrically conspicuous and hyperenhancing right level 2 B
lymph nodes (sagittal image 12).

Level 1 lymph nodes are within normal limits. The contralateral left
level 2 nodes are small but there is a conspicuous 9 mm left level
IIa node on series 3, image 43. The palpable area of concern on the
left was marked near the left submandibular gland which appears
normal.

No overtly necrotic nodes.

Vascular: Major vascular structures in the neck and at the skullbase
are patent. Note a retropharyngeal course of the left carotid.

Limited intracranial: Negative.

Visualized orbits: Negative.

Mastoids and visualized paranasal sinuses: Clear.

Skeleton: Scattered poor dentition. Mild degenerative changes in the
cervical spine. No acute or suspicious osseous lesion identified.

Upper chest: No superior mediastinal lymphadenopathy. Negative lung
apices.
IMPRESSION: 1. Relatively large approximately 3.5 cm laryngeal mass most
compatible with laryngeal carcinoma. Epicenter at the glottis on the
right. Extension across midline as well as involvement of laryngeal
cartilages (possibly also bilateral).
2. Relatively small but malignant appearing right level 2
lymphadenopathy. Suspicious contralateral left level 2 lymph nodes.

## 2018-12-03 MED FILL — LEVOTHYROXINE 150 MCG TAB: 150 | 30 days supply | Qty: 30 | Fill #1

## 2018-12-30 DIAGNOSIS — Z923 Personal history of irradiation: Secondary | ICD-10-CM | POA: Diagnosis not present

## 2018-12-30 DIAGNOSIS — Z930 Tracheostomy status: Secondary | ICD-10-CM | POA: Diagnosis not present

## 2018-12-30 DIAGNOSIS — Z9002 Acquired absence of larynx: Secondary | ICD-10-CM | POA: Diagnosis not present

## 2018-12-30 DIAGNOSIS — C329 Malignant neoplasm of larynx, unspecified: Secondary | ICD-10-CM | POA: Diagnosis not present

## 2019-01-01 ENCOUNTER — Other Ambulatory Visit: Payer: Self-pay

## 2019-01-01 ENCOUNTER — Ambulatory Visit: Payer: Medicaid Other | Admitting: Internal Medicine

## 2019-01-01 ENCOUNTER — Encounter (INDEPENDENT_AMBULATORY_CARE_PROVIDER_SITE_OTHER): Payer: Self-pay

## 2019-01-01 ENCOUNTER — Encounter: Payer: Self-pay | Admitting: Internal Medicine

## 2019-01-01 VITALS — BP 167/118 | HR 94 | Temp 99.1°F | Ht 65.0 in | Wt 140.0 lb

## 2019-01-01 DIAGNOSIS — Z79899 Other long term (current) drug therapy: Secondary | ICD-10-CM | POA: Diagnosis not present

## 2019-01-01 DIAGNOSIS — Z23 Encounter for immunization: Secondary | ICD-10-CM | POA: Diagnosis not present

## 2019-01-01 DIAGNOSIS — G8929 Other chronic pain: Secondary | ICD-10-CM

## 2019-01-01 DIAGNOSIS — I10 Essential (primary) hypertension: Secondary | ICD-10-CM

## 2019-01-01 DIAGNOSIS — M170 Bilateral primary osteoarthritis of knee: Secondary | ICD-10-CM | POA: Diagnosis not present

## 2019-01-01 DIAGNOSIS — Z87891 Personal history of nicotine dependence: Secondary | ICD-10-CM

## 2019-01-01 DIAGNOSIS — E032 Hypothyroidism due to medicaments and other exogenous substances: Secondary | ICD-10-CM | POA: Diagnosis not present

## 2019-01-01 DIAGNOSIS — M25562 Pain in left knee: Secondary | ICD-10-CM

## 2019-01-01 DIAGNOSIS — E039 Hypothyroidism, unspecified: Secondary | ICD-10-CM | POA: Diagnosis not present

## 2019-01-01 DIAGNOSIS — Z299 Encounter for prophylactic measures, unspecified: Secondary | ICD-10-CM

## 2019-01-01 MED ORDER — AMLODIPINE BESYLATE 5 MG PO TABS: 5.0000 mg | ORAL_TABLET | Freq: Every day | ORAL | 2 refills | Status: DC

## 2019-01-01 MED FILL — AMLODIPINE BESYLATE 5 MG TA: 5 | 30 days supply | Qty: 30 | Fill #0

## 2019-01-01 NOTE — Assessment & Plan Note (Signed)
Patient has a prior diagnosis of whitecoat hypertension that has been extensively documented with home ambulatory blood pressure monitoring on multiple occasions.  Today his blood pressure is again elevated, 167/118.  I called patient's wife to discuss home blood pressure readings with patient's permission.  Wife checks his blood pressure almost daily and reports that it is also been slightly elevated at home, ~ 130-135/ 90s since his last visit. Patient has previously been resistant to starting medication. I discussed with patient my recommendations for starting antihypertensive therapy now that his home BP readings are also elevated. He is agreeable. -Start amlodipine 5 mg daily -Continue home blood pressure monitoring -Follow-up 3 months

## 2019-01-01 NOTE — Assessment & Plan Note (Addendum)
Patient is here for follow-up of bilateral knee pain secondary to osteoarthritis.  His symptoms are well controlled with intermittent intra-articular steroid injections.  His last injection was over 3 months ago.  This provided him with symptom relief that lasted up until couple weeks ago.  Requesting repeat injections today.  Procedure Note: Bilateral Knee injections  Indication:Bilateral Knee Osteoarthritis  The patient was provided with risks, benefits, and alternatives to intraarticular injection. He consented to intraarticular knee injectionsfor bilateral knee pain due to osteoarthritis. After a time out was preformed, the knee was prepped in a sterile fashion. A mixture of 1 cc 1% lidocaine and 40 mg of Kenalog was injected into the knee using a 22gauge 1 1/2 inch needle. The needle was inserted at the lateral inferior patellar aspect of the knee whilepatient wasin aseated position.Bothknee spaces wereenteredwithoutdifficulty, entered successfully after 1attempt. The patient tolerated the procedures well without complication.

## 2019-01-01 NOTE — Progress Notes (Signed)
Subjective:   Patient ID: Jonathan [name] male   DOB: May 11, 1960 58 y.o.   MRN: [id number]  HPI: [name] is a 58 y.o. male with past medical history outlined below here for knee pain follow up. For the details of today's visit, please refer to the assessment and plan.   Past Medical History:  Diagnosis Date  . Allergic rhinitis   . Arthritis    Bilateral knee arthritis for about 20 years. Not taking any medications, believes in nature processes  . Cancer (HCC)    larynx  . Colon polyp   . Dental caries    prechemoradiation therapy, periodontitis  . Elevated blood pressure reading    White Coat Hypertension  . GERD (gastroesophageal reflux disease)   . Headache   . History of radiation therapy 09/11/16- 10/30/16   Larynx 70 Gy in 35 fractions  . HLD (hyperlipidemia)   . Hypothyroidism    radiation induced hypothyroidism  . Pneumonia    as a child   Current Outpatient Medications  Medication Sig Dispense Refill  . amLODipine (NORVASC) 5 MG tablet Take 1 tablet (5 mg total) by mouth daily. 30 tablet 2  . levothyroxine (SYNTHROID) 150 MCG tablet Take 1 tablet (150 mcg total) by mouth daily before breakfast. 30 tablet 1   No current facility-administered medications for this visit.    Family History  Problem Relation Age of Onset  . Prostate cancer Maternal Uncle 66       On radiation therapy now, diagnosed in around June 2012  . Colon cancer Maternal Uncle   . Heart attack Paternal Grandfather        Diet due to MI  . Lung cancer Paternal Aunt   . Brain cancer Paternal Aunt   . Kidney disease Maternal Uncle   . Hypertension Mother   . CAD Father   . Colon polyps Neg Hx   . Diabetes Neg Hx   . Gallbladder disease Neg Hx   . Esophageal cancer Neg Hx    Social History   Socioeconomic History  . Marital status: Single    Spouse name: Not on file  . Number of children: 4  . Years of education: Not on file  . Highest education level: Not on file  Occupational  History  . 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 Financial resource strain: Not on file  . Food insecurity    Worry: Not on file    Inability: Not on file  . Transportation needs    Medical: Not on file    Non-medical: Not on file  Tobacco Use  . Smoking status: Former Smoker    Packs/day: 0.50    Years: 30.00    Pack years: 15.00    Types: Cigarettes    Quit date: 12/31/2015    Years since quitting: 3.0  . Smokeless tobacco: Never Used  . Tobacco comment: not everyday-working on stopping completely   Substance and Sexual Activity  . Alcohol use: No    Alcohol/week: 2.0 standard drinks    Types: 2 Cans of beer per week  . Drug use: No    Frequency: 2.0 times per week    Comment: denies  . Sexual activity: Yes    Partners: Female  Lifestyle  . Physical activity    Days per week: Not on file    Minutes per session: Not on file  . Stress: Not on file  Relationships  . Social connections  Talks on phone: Not on file    Gets together: Not on file    Attends religious service: Not on file    Active member of club or organization: Not on file    Attends meetings of clubs or organizations: Not on file    Relationship status: Not on file  Other Topics Concern  . Not on file  Social History Narrative   Lives in [place] with his fiance.   Moved to [place] 11 years before. Used to live in [place] for 20 years before that.   Has 4 healthy kids from previous wife- separated now.   Is not working full-time since last 2 and half years- since early 2010.   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    Review of Systems: Review of Systems  Respiratory: Negative for shortness of breath.   Cardiovascular: Negative for chest pain and palpitations.     Objective:  Physical Exam:  Vitals:   01/01/19 0956  BP: (!) 167/118  Pulse: 94  Temp: 99.1 F (37.3 C)  TempSrc: Oral  SpO2: 97%  Weight: 140 lb (63.5  kg)  Height: 5\' 5"  (1.651 m)    Constitutional: NAD, appears comfortable HEENT:Stoma with tracheostomy plug, no erythema or drainage Cardiovascular: Tachycardic but regular, no m/r/g Pulmonary/Chest: CTAB, no wheezes, rales, or rhonchi.  Extremities: Warm and well perfused. No edema.  Psychiatric: Normal mood and affect  Assessment & Plan:

## 2019-01-01 NOTE — Assessment & Plan Note (Addendum)
Repeat TSH today.  ADDENDUM: TSH within normal limits. Will repeat again in 6 months then yearly if remains within the normal range.

## 2019-01-01 NOTE — Assessment & Plan Note (Addendum)
Referral placed for colonoscopy per patient request.

## 2019-01-01 NOTE — Patient Instructions (Signed)
Jonathan [name],  It was a pleasure to see you today. I am glad you are doing well!  I have started a medication for your blood pressure. Please take amlodipine once a day. Follow up with me again in 3 months.  If your thyroid labs are abnormal I will call you with the results. Otherwise if you don't hear from me, please continue to take your synthroid as previously prescribed.  If you have any questions or concerns, call our clinic at [phone] or after hours call [phone] and ask for the internal medicine resident on call. Thank you!  Dr. Philipp Ovens

## 2019-01-02 LAB — TSH: TSH: 1.1 u[IU]/mL (ref 0.450–4.500)

## 2019-01-09 DIAGNOSIS — Z9002 Acquired absence of larynx: Secondary | ICD-10-CM | POA: Diagnosis not present

## 2019-01-09 DIAGNOSIS — Z923 Personal history of irradiation: Secondary | ICD-10-CM | POA: Diagnosis not present

## 2019-01-09 DIAGNOSIS — R490 Dysphonia: Secondary | ICD-10-CM | POA: Diagnosis not present

## 2019-01-21 ENCOUNTER — Other Ambulatory Visit: Payer: Self-pay | Admitting: Internal Medicine

## 2019-01-21 MED FILL — LEVOTHYROXINE 150 MCG TAB: 150 | 30 days supply | Qty: 30 | Fill #0

## 2019-01-23 DIAGNOSIS — R490 Dysphonia: Secondary | ICD-10-CM | POA: Diagnosis not present

## 2019-01-23 DIAGNOSIS — C329 Malignant neoplasm of larynx, unspecified: Secondary | ICD-10-CM | POA: Diagnosis not present

## 2019-01-23 DIAGNOSIS — Z923 Personal history of irradiation: Secondary | ICD-10-CM | POA: Diagnosis not present

## 2019-01-23 DIAGNOSIS — K219 Gastro-esophageal reflux disease without esophagitis: Secondary | ICD-10-CM | POA: Diagnosis not present

## 2019-01-23 DIAGNOSIS — Z87891 Personal history of nicotine dependence: Secondary | ICD-10-CM | POA: Diagnosis not present

## 2019-01-23 MED FILL — OMEPRAZOLE 20 MG CAP: 20 | 30 days supply | Qty: 30 | Fill #0

## 2019-01-27 MED FILL — AMLODIPINE BESYLATE 5 MG TA: 5 | 30 days supply | Qty: 30 | Fill #1

## 2019-02-24 ENCOUNTER — Encounter: Payer: Self-pay | Admitting: *Deleted

## 2019-02-25 ENCOUNTER — Encounter: Payer: Self-pay | Admitting: Internal Medicine

## 2019-02-25 ENCOUNTER — Ambulatory Visit: Payer: Medicaid Other | Admitting: Internal Medicine

## 2019-02-25 ENCOUNTER — Other Ambulatory Visit: Payer: Self-pay

## 2019-02-25 VITALS — BP 110/76 | HR 93 | Temp 97.8°F | Ht 65.0 in | Wt 137.0 lb

## 2019-02-25 DIAGNOSIS — Z8521 Personal history of malignant neoplasm of larynx: Secondary | ICD-10-CM

## 2019-02-25 DIAGNOSIS — Z1159 Encounter for screening for other viral diseases: Secondary | ICD-10-CM

## 2019-02-25 DIAGNOSIS — Z86010 Personal history of colonic polyps: Secondary | ICD-10-CM | POA: Diagnosis not present

## 2019-02-25 DIAGNOSIS — K219 Gastro-esophageal reflux disease without esophagitis: Secondary | ICD-10-CM | POA: Diagnosis not present

## 2019-02-25 MED ORDER — SUPREP BOWEL PREP KIT 17.5-3.13-1.6 GM/177ML PO SOLN: 1.0000 | ORAL | 0 refills | Status: DC

## 2019-02-25 NOTE — Patient Instructions (Signed)
You have been scheduled for a colonoscopy. Please follow written instructions given to you at your visit today.  Please pick up your prep supplies at the pharmacy within the next 1-3 days. If you use inhalers (even only as needed), please bring them with you on the day of your procedure. Your physician has requested that you go to www.startemmi.com and enter the access code given to you at your visit today. This web site gives a general overview about your procedure. However, you should still follow specific instructions given to you by our office regarding your preparation for the procedure.  If you are age 60 or older, your body mass index should be between 23-30. Your Body mass index is 22.8 kg/m. If this is out of the aforementioned range listed, please consider follow up with your Primary Care Provider.  If you are age 25 or younger, your body mass index should be between 19-25. Your Body mass index is 22.8 kg/m. If this is out of the aformentioned range listed, please consider follow up with your Primary Care Provider.

## 2019-02-25 NOTE — Progress Notes (Signed)
Patient ID: Jonathan [name], male   DOB: November 29, 1960, 58 y.o.   MRN: [id number] HPI: Jonathan [name] is a 58 year old male with a history of adenomatous colon polyps, history of laryngeal cancer status post tracheostomy with history of chemotherapy and radiation followed by total laryngectomy in May 2019, hypothyroidism, GERD was seen to discuss polyp surveillance.  He is here with his wife today.  He had a colonoscopy which I performed in December 2013.  He had 3 polyps removed ranging from 3 to 20 mm in size.  There was sigmoid diverticulosis and moderate-sized internal hemorrhoids.  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  He reports he is feeling well.  His bowels are working daily without diarrhea or constipation.  No abdominal or rectal pain.  No rectal bleeding or melena.  He was having some heartburn and Dr. Constance Holster, his ENT provider placed him on low-dose omeprazole 20 mg daily.  His heartburn has completely resolved.  No dysphagia or odynophagia.  No nausea or vomiting.  No chest pain or shortness of breath  Past Medical History:  Diagnosis Date  . Allergic rhinitis   . Arthritis    Bilateral knee arthritis for about 20 years. Not taking any medications, believes in nature processes  . Colon polyp   . Dental caries    prechemoradiation therapy, periodontitis  . Elevated blood pressure reading    White Coat Hypertension  . GERD (gastroesophageal reflux disease)   . Headache   . History of radiation therapy 09/11/16- 10/30/16   Larynx 70 Gy in 35 fractions  . HLD (hyperlipidemia)   . Hypothyroidism    radiation induced hypothyroidism  . Internal hemorrhoids   . Laryngeal cancer ([place])   . Pneumonia    as a child  . Tubular adenoma of colon     Past Surgical History:  Procedure Laterality Date  . COLONOSCOPY  03/18/2012  . DIRECT LARYNGOSCOPY N/A 07/06/2017   Procedure: DIRECT LARYNGOSCOPY;  Surgeon: Izora Gala, MD;  Location: [facility];   Service: ENT;  Laterality: N/A;  . ESOPHAGOSCOPY  04/24/2016   Procedure: ESOPHAGOSCOPY;  Surgeon: Izora Gala, MD;  Location: [facility];  Service: ENT;;  . IR GENERIC HISTORICAL  06/02/2016   IR US GUIDE VASC ACCESS RIGHT 06/02/2016 Sandi Mariscal, MD [facility]  . IR GENERIC HISTORICAL  06/02/2016   IR FLUORO GUIDE PORT INSERTION RIGHT 06/02/2016 Sandi Mariscal, MD [facility]  . IR REMOVAL TUN ACCESS W/ PORT W/O FL MOD SED  04/16/2018  . LARYNGETOMY N/A 08/10/2017   Procedure: TOTAL LARYNGECTOMY;  Surgeon: Izora Gala, MD;  Location: [facility];  Service: ENT;  Laterality: N/A;  . LARYNGOSCOPY  04/24/2016   Procedure: LARYNGOSCOPY;  Surgeon: Izora Gala, MD;  Location: [facility];  Service: ENT;;  . MULTIPLE EXTRACTIONS WITH ALVEOLOPLASTY N/A 05/26/2016   Procedure: Extraction of tooth #'s 2,5,6,8-13, 21-28 with alveoloplasty, bilateral mandibular tori reductions and buccal exostoses reductions of maxillary left, maxillary right, and mandibular left quadrants.;  Surgeon: Lenn Cal, DDS;  Location: [facility];  Service: Oral Surgery;  Laterality: N/A;  . STOMAPLASTY N/A 06/05/2018   Procedure: Tracheal STOMAPLASTY;  Surgeon: Izora Gala, MD;  Location: [facility];  Service: ENT;  Laterality: N/A;  . TRACHEAL ESOPHAGEAL PROSTHESIS (TEP) CHANGE N/A 09/25/2018   Procedure: TRACHEAL ESOPHAGEAL PROSTHESIS (TEP) CHANGE;  Surgeon: Izora Gala, MD;  Location: [facility];  Service: ENT;  Laterality: N/A;  . TRACHEOSTOMY TUBE PLACEMENT N/A 04/24/2016  Procedure: TRACHEOSTOMY;  Surgeon: Izora Gala, MD;  Location: [facility];  Service: ENT;  Laterality: N/A;    Outpatient Medications Prior to Visit  Medication Sig Dispense Refill  . amLODipine (NORVASC) 5 MG tablet Take 1 tablet (5 mg total) by mouth daily. 30 tablet 2  . levothyroxine (SYNTHROID) 150 MCG tablet TAKE 1 TABLET (150 MCG TOTAL) BY MOUTH DAILY BEFORE BREAKFAST. 30 tablet 1  . omeprazole (PRILOSEC) 20 MG capsule Take 20 mg by mouth daily.     No facility-administered  medications prior to visit.     No Known Allergies  Family History  Problem Relation Age of Onset  . Prostate cancer Maternal Uncle 66       On radiation therapy now, diagnosed in around June 2012  . Colon cancer Maternal Uncle   . Heart attack Paternal Grandfather        Diet due to MI  . Lung cancer Paternal Aunt   . Brain cancer Paternal Aunt   . Kidney disease Maternal Uncle   . Hypertension Mother   . CAD Father   . Colon polyps Neg Hx   . Diabetes Neg Hx   . Gallbladder disease Neg Hx   . Esophageal cancer Neg Hx     Social History   Tobacco Use  . Smoking status: Former Smoker    Packs/day: 0.50    Years: 30.00    Pack years: 15.00    Types: Cigarettes    Quit date: 12/31/2015    Years since quitting: 3.1  . Smokeless tobacco: Never Used  . Tobacco comment: not everyday-working on stopping completely   Substance Use Topics  . Alcohol use: No    Alcohol/week: 2.0 standard drinks    Types: 2 Cans of beer per week  . Drug use: No    Frequency: 2.0 times per week    Comment: denies    ROS: As per history of present illness, otherwise negative  BP 110/76   Pulse 93   Temp 97.8 F (36.6 C)   Ht 5\' 5"  (1.651 m)   Wt 137 lb (62.1 kg)   BMI 22.80 kg/m  Constitutional: Well-developed and well-nourished. No distress. HEENT: Normocephalic and atraumatic.Conjunctivae are normal.  No scleral icterus.  Neck: Stoma intact and healthy-appearing, TEP in place Cardiovascular: Normal rate, regular rhythm and intact distal pulses. No M/R/G Pulmonary/chest: Effort normal and breath sounds normal. No wheezing, rales or rhonchi. Abdominal: Soft, nontender, nondistended. Bowel sounds active throughout. There are no masses palpable. No hepatosplenomegaly. Extremities: no clubbing, cyanosis, or edema Neurological: Alert and oriented to person place and time. Skin: Skin is warm and dry.  Psychiatric: Normal mood and affect. Behavior is normal.  RELEVANT LABS AND  IMAGING: CBC    Component Value Date/Time   WBC 6.6 09/17/2018 1350   RBC 4.92 09/17/2018 1350   HGB 14.8 09/17/2018 1350   HGB 11.9 (L) 01/04/2017 1432   HCT 45.7 09/17/2018 1350   HCT 35.7 (L) 01/04/2017 1432   PLT 304 09/17/2018 1350   PLT 265 01/04/2017 1432   MCV 92.9 09/17/2018 1350   MCV 90.5 01/04/2017 1432   MCH 30.1 09/17/2018 1350   MCHC 32.4 09/17/2018 1350   RDW 15.8 (H) 09/17/2018 1350   RDW 16.1 (H) 01/04/2017 1432   LYMPHSABS 0.7 04/16/2018 0830   LYMPHSABS 0.5 (L) 01/04/2017 1432   MONOABS 0.7 04/16/2018 0830   MONOABS 0.5 01/04/2017 1432   EOSABS 0.1 04/16/2018 0830   EOSABS 0.4 01/04/2017 1432  BASOSABS 0.1 04/16/2018 0830   BASOSABS 0.0 01/04/2017 1432    CMP     Component Value Date/Time   NA 140 09/17/2018 1350   NA 141 01/04/2017 1432   K 3.7 09/17/2018 1350   K 4.1 01/04/2017 1432   CL 105 09/17/2018 1350   CO2 24 09/17/2018 1350   CO2 27 01/04/2017 1432   GLUCOSE 96 09/17/2018 1350   GLUCOSE 93 01/04/2017 1432   BUN 5 (L) 09/17/2018 1350   BUN 12.2 01/04/2017 1432   CREATININE 0.97 09/17/2018 1350   CREATININE 0.8 01/04/2017 1432   CALCIUM 9.5 09/17/2018 1350   CALCIUM 9.8 01/04/2017 1432   PROT 7.7 04/12/2017 1405   PROT 6.9 01/04/2017 1432   ALBUMIN 4.2 04/12/2017 1405   ALBUMIN 3.8 01/04/2017 1432   AST 16 04/12/2017 1405   AST 18 01/04/2017 1432   ALT 11 04/12/2017 1405   ALT 12 01/04/2017 1432   ALKPHOS 81 04/12/2017 1405   ALKPHOS 67 01/04/2017 1432   BILITOT 0.4 04/12/2017 1405   BILITOT 0.23 01/04/2017 1432   GFRNONAA >60 09/17/2018 1350   GFRNONAA 82 03/06/2011 0853   GFRAA >60 09/17/2018 1350   GFRAA >89 03/06/2011 0853    ASSESSMENT/PLAN: 58 year old male with a history of adenomatous colon polyps, history of laryngeal cancer status post tracheostomy with history of chemotherapy and radiation followed by total laryngectomy in May 2019, hypothyroidism, GERD was seen to discuss polyp surveillance.  1.  History of  adenomatous colon polyps --he is due surveillance colonoscopy at this time.  We discussed the risk, benefits and alternatives and he is agreeable and wishes to proceed.  He looks very well and the procedure will likely take place in our endoscopy center but I will check with anesthesia to ensure they are comfortable proceeding.  Should there be an issue, we would change his procedure to the outpatient hospital setting.   2. GERD --no alarm symptoms on low-dose omeprazole 20 mg.  This was started recently with total resolution of heartburn.  He will continue omeprazole 20 mg daily for now     [id number], [address], [place] [address],  [place] [address]

## 2019-03-05 MED FILL — OMEPRAZOLE 20 MG CAP: 20 | 30 days supply | Qty: 30 | Fill #1

## 2019-03-05 MED FILL — AMLODIPINE BESYLATE 5 MG TA: 5 | 30 days supply | Qty: 30 | Fill #2

## 2019-03-10 ENCOUNTER — Other Ambulatory Visit: Payer: Self-pay | Admitting: Internal Medicine

## 2019-03-10 ENCOUNTER — Ambulatory Visit (INDEPENDENT_AMBULATORY_CARE_PROVIDER_SITE_OTHER): Payer: Medicaid Other

## 2019-03-10 ENCOUNTER — Other Ambulatory Visit: Payer: Self-pay | Admitting: *Deleted

## 2019-03-10 DIAGNOSIS — Z1159 Encounter for screening for other viral diseases: Secondary | ICD-10-CM

## 2019-03-10 MED FILL — LEVOTHYROXINE 150 MCG TAB: 150 | 14 days supply | Qty: 14 | Fill #1

## 2019-03-10 NOTE — Telephone Encounter (Signed)
Pharm is switching from sandoz levothyroxine to mylan brand, are you ok with this?

## 2019-03-11 LAB — SARS CORONAVIRUS 2 (TAT 6-24 HRS): SARS Coronavirus 2: NEGATIVE

## 2019-03-11 MED ORDER — LEVOTHYROXINE SODIUM 150 MCG PO TABS: 150.0000 ug | ORAL_TABLET | Freq: Every day | ORAL | 2 refills | Status: DC

## 2019-03-11 NOTE — Telephone Encounter (Signed)
Yes, that is fine.  Thank you.

## 2019-03-13 ENCOUNTER — Other Ambulatory Visit: Payer: Self-pay

## 2019-03-13 ENCOUNTER — Encounter: Payer: Self-pay | Admitting: Internal Medicine

## 2019-03-13 ENCOUNTER — Ambulatory Visit (AMBULATORY_SURGERY_CENTER): Payer: Medicaid Other | Admitting: Internal Medicine

## 2019-03-13 VITALS — BP 126/90 | HR 88 | Temp 99.0°F | Resp 18 | Ht 65.0 in | Wt 137.0 lb

## 2019-03-13 DIAGNOSIS — D123 Benign neoplasm of transverse colon: Secondary | ICD-10-CM

## 2019-03-13 DIAGNOSIS — K620 Anal polyp: Secondary | ICD-10-CM

## 2019-03-13 DIAGNOSIS — K6282 Dysplasia of anus: Secondary | ICD-10-CM

## 2019-03-13 DIAGNOSIS — Z86010 Personal history of colonic polyps: Secondary | ICD-10-CM | POA: Diagnosis not present

## 2019-03-13 DIAGNOSIS — Z1211 Encounter for screening for malignant neoplasm of colon: Secondary | ICD-10-CM | POA: Diagnosis not present

## 2019-03-13 MED ORDER — SODIUM CHLORIDE 0.9 % IV SOLN: 500.0000 mL | Freq: Once | INTRAVENOUS | Status: DC

## 2019-03-13 NOTE — Progress Notes (Signed)
PT taken to PACU. Monitors in place. VSS. Report given to RN. 

## 2019-03-13 NOTE — Progress Notes (Signed)
Temp taken by JB VS taken by DT 

## 2019-03-13 NOTE — Patient Instructions (Signed)
USE TUCKS PADS OTC. 1 POLYP REMOVED AND BIOPSIES TAKEN FROM RECTUM. DR PYRTLE WILL MAIL YOU A LETTER ABOUT PATHOLOGY IN 1-3 WEEKS.      YOU HAD AN ENDOSCOPIC PROCEDURE TODAY AT [facility]:   Refer to the procedure report that was given to you for any specific questions about what was found during the examination.  If the procedure report does not answer your questions, please call your gastroenterologist to clarify.  If you requested that your care partner not be given the details of your procedure findings, then the procedure report has been included in a sealed envelope for you to review at your convenience later.  YOU SHOULD EXPECT: Some feelings of bloating in the abdomen. Passage of more gas than usual.  Walking can help get rid of the air that was put into your GI tract during the procedure and reduce the bloating. If you had a lower endoscopy (such as a colonoscopy or flexible sigmoidoscopy) you may notice spotting of blood in your stool or on the toilet paper. If you underwent a bowel prep for your procedure, you may not have a normal bowel movement for a few days.  Please Note:  You might notice some irritation and congestion in your nose or some drainage.  This is from the oxygen used during your procedure.  There is no need for concern and it should clear up in a day or so.  SYMPTOMS TO REPORT IMMEDIATELY:   Following lower endoscopy (colonoscopy or flexible sigmoidoscopy):  Excessive amounts of blood in the stool  Significant tenderness or worsening of abdominal pains  Swelling of the abdomen that is new, acute  Fever of 100F or higher   For urgent or emergent issues, a gastroenterologist can be reached at any hour by calling [phone].   DIET:  We do recommend a small meal at first, but then you may proceed to your regular diet.  Drink plenty of fluids but you should avoid alcoholic beverages for 24 hours.  ACTIVITY:  You should plan to take it easy  for the rest of today and you should NOT DRIVE or use heavy machinery until tomorrow (because of the sedation medicines used during the test).    FOLLOW UP: Our staff will call the number listed on your records 48-72 hours following your procedure to check on you and address any questions or concerns that you may have regarding the information given to you following your procedure. If we do not reach you, we will leave a message.  We will attempt to reach you two times.  During this call, we will ask if you have developed any symptoms of COVID 19. If you develop any symptoms (ie: fever, flu-like symptoms, shortness of breath, cough etc.) before then, please call [phone].  If you test positive for Covid 19 in the 2 weeks post procedure, please call and report this information to [place].    If any biopsies were taken you will be contacted by phone or by letter within the next 1-3 weeks.  Please call us at [phone] if you have not heard about the biopsies in 3 weeks.    SIGNATURES/CONFIDENTIALITY: You and/or your care partner have signed paperwork which will be entered into your electronic medical record.  These signatures attest to the fact that that the information above on your After Visit Summary has been reviewed and is understood.  Full responsibility of the confidentiality of this discharge information lies with you and/or  your care-partner. 

## 2019-03-13 NOTE — Op Note (Signed)
[facility] Patient Name: Jonathan [name] Procedure Date: 03/13/2019 3:10 PM MRN: [id number] Endoscopist: Jerene Bears , MD Age: 58 Referring MD:  Date of Birth: 1960-05-31 Gender: Male Account #: [id number] Procedure:                Colonoscopy Indications:              High risk colon cancer surveillance: Personal                            history of non-advanced adenoma, Last colonoscopy:                            2013 Medicines:                Monitored Anesthesia Care Procedure:                Pre-Anesthesia Assessment:                           - Prior to the procedure, a History and Physical                            was performed, and patient medications and                            allergies were reviewed. The patient's tolerance of                            previous anesthesia was also reviewed. The risks                            and benefits of the procedure and the sedation                            options and risks were discussed with the patient.                            All questions were answered, and informed consent                            was obtained. Prior Anticoagulants: The patient has                            taken no previous anticoagulant or antiplatelet                            agents. ASA Grade Assessment: III - A patient with                            severe systemic disease. After reviewing the risks                            and benefits, the patient was deemed in  satisfactory condition to undergo the procedure.                           After obtaining informed consent, the colonoscope                            was passed under direct vision. Throughout the                            procedure, the patient's blood pressure, pulse, and                            oxygen saturations were monitored continuously. The                            Colonoscope was introduced through the anus and                 advanced to the cecum, identified by appendiceal                            orifice and ileocecal valve. The colonoscopy was                            performed without difficulty. The patient tolerated                            the procedure well. The quality of the bowel                            preparation was good. The ileocecal valve,                            appendiceal orifice, and rectum were photographed. Scope In: 3:20:58 PM Scope Out: 3:36:38 PM Scope Withdrawal Time: 0 hours 12 minutes 19 seconds  Total Procedure Duration: 0 hours 15 minutes 40 seconds  Findings:                 The digital rectal exam was normal.                           A 6 mm polyp was found in the hepatic flexure. The                            polyp was sessile. The polyp was removed with a                            cold snare. Resection and retrieval were complete.                           Multiple small-mouthed diverticula were found in                            the sigmoid colon.  Internal hemorrhoids were found during retroflexion.                           A small polyp was found in the anal canal at the                            dentate line. The polyp was sessile. Biopsies were                            taken with a cold forceps for histology to exclude                            AIN. Complications:            No immediate complications. Estimated Blood Loss:     Estimated blood loss was minimal. Impression:               - One 6 mm polyp at the hepatic flexure, removed                            with a cold snare. Resected and retrieved.                           - Diverticulosis in the sigmoid colon.                           - Internal hemorrhoids.                           - Small polyp at the anus. Biopsied. Recommendation:           - Patient has a contact number available for                            emergencies. The signs and symptoms of  potential                            delayed complications were discussed with the                            patient. Return to normal activities tomorrow.                            Written discharge instructions were provided to the                            patient.                           - Resume previous diet.                           - Continue present medications.                           - Await pathology results.                           -  Repeat colonoscopy is recommended for                            surveillance. The colonoscopy date will be                            determined after pathology results from today's                            exam become available for review. Jerene Bears, MD 03/13/2019 3:40:13 PM This report has been signed electronically.

## 2019-03-17 ENCOUNTER — Telehealth: Payer: Self-pay

## 2019-03-17 NOTE — Telephone Encounter (Signed)
No answer, left message to call back later today, B.Lennan Malone RN. 

## 2019-03-17 NOTE — Telephone Encounter (Signed)
  Follow up Call-  Call back number 03/13/2019  Post procedure Call Back phone  # [phone]  Permission to leave phone message Yes  Some recent data might be hidden     Left message

## 2019-03-19 ENCOUNTER — Encounter: Payer: Self-pay | Admitting: Internal Medicine

## 2019-03-20 MED FILL — LEVOTHYROXINE SODIUM 150 MC: 150 | 30 days supply | Qty: 30 | Fill #0

## 2019-04-07 ENCOUNTER — Other Ambulatory Visit: Payer: Self-pay | Admitting: Internal Medicine

## 2019-04-07 DIAGNOSIS — I10 Essential (primary) hypertension: Secondary | ICD-10-CM

## 2019-04-07 MED FILL — AMLODIPINE BESYLATE 5 MG TA: 5 | 30 days supply | Qty: 30 | Fill #0

## 2019-04-07 MED FILL — OMEPRAZOLE 20 MG CAP: 20 | 30 days supply | Qty: 30 | Fill #2

## 2019-04-12 MED FILL — LEVOTHYROXINE SODIUM 150 MC: 150 | 30 days supply | Qty: 30 | Fill #1

## 2019-04-14 DIAGNOSIS — Z449 Encounter for fitting and adjustment of unspecified external prosthetic device: Secondary | ICD-10-CM | POA: Diagnosis not present

## 2019-04-14 DIAGNOSIS — R490 Dysphonia: Secondary | ICD-10-CM | POA: Diagnosis not present

## 2019-04-23 DIAGNOSIS — Z430 Encounter for attention to tracheostomy: Secondary | ICD-10-CM | POA: Diagnosis not present

## 2019-04-29 ENCOUNTER — Ambulatory Visit: Payer: Self-pay | Admitting: Surgery

## 2019-04-29 DIAGNOSIS — R85613 High grade squamous intraepithelial lesion on cytologic smear of anus (HGSIL): Secondary | ICD-10-CM | POA: Diagnosis not present

## 2019-04-29 DIAGNOSIS — K620 Anal polyp: Secondary | ICD-10-CM | POA: Diagnosis not present

## 2019-04-29 NOTE — H&P (Addendum)
CC: Referred by Dr. Hilarie [name] for anal polyp with dysplasia  HPI: Jonathan [name] is a very pleasant 22yoM with hx of HTN, GERD, larygneal ca (s/p cXRT, laryngectomy with voice box), tobacco use (still smoking) who presents the office following a colonoscopy at Dr. Hilarie [name] for personal history of polyps. He was found have a small polyp and anal canal biopsy returned with HGSIL. He was referred to [place] for further evaluation and management of this anal polyp. He denies any symptoms related to this - occasional/rare bright red blood when he wipes. He denies any anal pain. He denies any known history of HPV. He denies any history of warts on his body.  PMH: HTN (well-controlled on oral antihypertensive), GERD (well-controlled PPI), larygneal ca (s/p cXRT, laryngectomy with voice box), tobacco use (uncontrolled, still smoking)  PSH: Laryngectomy; he denies any prior anorectal procedures or surgeries  FHx: Denies FHx of malignancy  Social: +tobacco use; denies use of EtOH/drugs;  ROS: A comprehensive 10 system review of systems was completed with the patient and pertinent findings as noted above.  The patient is a 59 year old male.   Past Surgical History Jonathan [name], Jonathan [name]; 04/29/2019 10:03 AM) Colon Polyp Removal - Colonoscopy   Diagnostic Studies History Jonathan [name], CMA; 04/29/2019 10:03 AM) Colonoscopy  within last year  Allergies Jonathan [name], CMA; 04/29/2019 10:03 AM) No Known Drug Allergies [04/29/2019]: Allergies Reconciled   Medication History Jonathan [name], CMA; 04/29/2019 10:04 AM) amLODIPine Besylate (5MG  Tablet, Oral) Active. Omeprazole (20MG  Capsule ER, Oral) Active. Levothyroxine Sodium (150MCG Tablet, Oral) Active. Medications Reconciled  Social History Jonathan [name], CMA; 04/29/2019 10:03 AM) Alcohol use  Occasional alcohol use. Caffeine use  Coffee, Tea. Illicit drug use  Remotely quit drug use. Tobacco use  Former smoker.  Family History Jonathan [name],  [place]; 04/29/2019 10:03 AM) Arthritis  Mother. Colon Polyps  Family Members In General. Hypertension  Brother, Father, Mother. Migraine Headache  Sister.  Other Problems Jonathan [name], CMA; 04/29/2019 10:03 AM) Arthritis  Diverticulosis  Gastroesophageal Reflux Disease  Hemorrhoids  High blood pressure  Thyroid Disease     Review of Systems Jonathan Gave M. Yuette Putnam MD; 04/29/2019 10:12 AM) General Not Present- Appetite Loss, Chills, Fatigue, Fever, Night Sweats, Weight Gain and Weight Loss. Skin Not Present- Change in Wart/Mole, Dryness, Hives, Jaundice, New Lesions, Non-Healing Wounds, Rash and Ulcer. HEENT Present- Seasonal Allergies and Sinus Pain. Not Present- Earache, Hearing Loss, Hoarseness, Nose Bleed, Oral Ulcers, Ringing in the Ears, Sore Throat, Visual Disturbances, Wears glasses/contact lenses and Yellow Eyes. Respiratory Not Present- Bloody sputum, Chronic Cough, Difficulty Breathing, Snoring and Wheezing. Breast Not Present- Breast Mass, Breast Pain, Nipple Discharge and Skin Changes. Cardiovascular Not Present- Chest Pain, Difficulty Breathing Lying Down, Leg Cramps, Palpitations, Rapid Heart Rate, Shortness of Breath and Swelling of Extremities. Gastrointestinal Present- Hemorrhoids. Not Present- Abdominal Pain, Bloating, Bloody Stool, Change in Bowel Habits, Chronic diarrhea, Constipation, Difficulty Swallowing, Excessive gas, Gets full quickly at meals, Indigestion, Nausea, Rectal Pain and Vomiting. Male Genitourinary Not Present- Blood in Urine, Change in Urinary Stream, Frequency, Impotence, Nocturia, Painful Urination, Urgency and Urine Leakage. Neurological Not Present- Decreased Memory and Difficulty Speaking. Endocrine Not Present- Appetite Changes and Cold Intolerance. Hematology Not Present- Abnormal Bleeding, Blood Clots and Blood Thinners.  Vitals Jonathan [name] CMA; 04/29/2019 10:05 AM) 04/29/2019 10:04 AM Weight: 134.6 lb Height: 65in Body Surface  Area: 1.67 m Body Mass Index: 22.4 kg/m  Temp.: 98.66F(Tympanic)  Pulse: 66 (Regular)  BP: 130/82 (Sitting, Left Arm, Standard)  Physical Exam Jonathan Gave M. Tymeka Privette MD; 04/29/2019 10:31 AM) The physical exam findings are as follows: Note:Constitutional: No acute distress; conversant; no deformities; wearing mask Eyes: Moist conjunctiva; no lid lag; anicteric sclerae; pupils equal and round Neck: Trachea midline; no palpable thyromegaly; tracheal os with cap in place Lungs: Normal respiratory effort; no tactile fremitus CV: Regular rate and rhythm; no palpable thrill; no pitting edema GI: Abdomen soft, nontender, nondistended; no palpable hepatosplenomegaly Anorectal: No external perianal skin changes; DRE - normal tone, no palpable masses Anoscopy: Circumferential anoscopy demonstrates polypoid lesion in anal canal left posterior, possible one at left lateral as well; no fissures or large internal hemorrhoids; no masses or ulcerations MSK: Normal gait; no clubbing/cyanosis Psychiatric: Appropriate affect; alert and oriented 3 Lymphatic: No palpable inguinal or axillary lymphadenopathy **A chaperone, [employer], was present for this encounter    Assessment & Plan Jonathan Gave M. 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 The pathophysiology of anal polyps and HGSIL was discussed at length with associated pictures and illustrations. We discussed that this is a disease caused by the human papilloma virus. We discussed this is a sexually transmitted infection and risk of spread to sexual partners. We discussed importance of safe sex practices as well. We discussed long-term  risks of anal cancer and importance of surveillance moving forward. We discussed again the lesion that was seen and biopsied that had these findings, proceeding with anorectal examination anesthesia and excision of anal canal polypoid lesion. We discussed removing anything else that we see at the time of surgery that looks abnormal in the anal canal as well. -The planned procedure, material risks (including, but not limited to, pain, bleeding, infection, scarring, need for blood transfusion, damage to anal sphincter, need for additional procedures, recurrence of lesion, long-term developement of anal cancer despite this lesion being removed at this surgery, pneumonia, heart attack, stroke, death) benefits and alternatives to surgery were discussed at length. The patient's questions were answered to his satisfaction, he voiced understanding and elected to proceed with surgery. Additionally, we discussed typical postoperative expectations and the recovery process. 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

## 2019-04-29 NOTE — H&P (View-Only) (Signed)
CC: Referred by Dr. Hilarie Fredrickson for anal polyp with dysplasia  HPI: Jonathan [name] is a very pleasant 59yoM with hx of HTN, GERD, larygneal ca (s/p cXRT, laryngectomy with voice box), tobacco use (still smoking) who presents the office following a colonoscopy at Dr. Hilarie Fredrickson for personal history of polyps. He was found have a small polyp and anal canal biopsy returned with HGSIL. He was referred to [place] for further evaluation and management of this anal polyp. He denies any symptoms related to this - occasional/rare bright red blood when he wipes. He denies any anal pain. He denies any known history of HPV. He denies any history of warts on his body.  PMH: HTN (well-controlled on oral antihypertensive), GERD (well-controlled PPI), larygneal ca (s/p cXRT, laryngectomy with voice box), tobacco use (uncontrolled, still smoking)  PSH: Laryngectomy; he denies any prior anorectal procedures or surgeries  FHx: Denies FHx of malignancy  Social: +tobacco use; denies use of EtOH/drugs;  ROS: A comprehensive 10 system review of systems was completed with the patient and pertinent findings as noted above.  The patient is a 59 year old male.   Past Surgical History Jonathan [name], [place]; 04/29/2019 10:03 AM) Colon Polyp Removal - Colonoscopy   Diagnostic Studies History Jonathan [name], CMA; 04/29/2019 10:03 AM) Colonoscopy  within last year  Allergies Jonathan [name], CMA; 04/29/2019 10:03 AM) No Known Drug Allergies [04/29/2019]: Allergies Reconciled   Medication History Jonathan [name], CMA; 04/29/2019 10:04 AM) amLODIPine Besylate (5MG  Tablet, Oral) Active. Omeprazole (20MG  Capsule ER, Oral) Active. Levothyroxine Sodium (150MCG Tablet, Oral) Active. Medications Reconciled  Social History Jonathan [name], CMA; 04/29/2019 10:03 AM) Alcohol use  Occasional alcohol use. Caffeine use  Coffee, Tea. Illicit drug use  Remotely quit drug use. Tobacco use  Former smoker.  Family History Jonathan [name],  [place]; 04/29/2019 10:03 AM) Arthritis  Mother. Colon Polyps  Family Members In General. Hypertension  Brother, Father, Mother. Migraine Headache  Sister.  Other Problems Jonathan [name], CMA; 04/29/2019 10:03 AM) Arthritis  Diverticulosis  Gastroesophageal Reflux Disease  Hemorrhoids  High blood pressure  Thyroid Disease     Review of Systems Jonathan Gave M. Tydarius Yawn MD; 04/29/2019 10:12 AM) General Not Present- Appetite Loss, Chills, Fatigue, Fever, Night Sweats, Weight Gain and Weight Loss. Skin Not Present- Change in Wart/Mole, Dryness, Hives, Jaundice, New Lesions, Non-Healing Wounds, Rash and Ulcer. HEENT Present- Seasonal Allergies and Sinus Pain. Not Present- Earache, Hearing Loss, Hoarseness, Nose Bleed, Oral Ulcers, Ringing in the Ears, Sore Throat, Visual Disturbances, Wears glasses/contact lenses and Yellow Eyes. Respiratory Not Present- Bloody sputum, Chronic Cough, Difficulty Breathing, Snoring and Wheezing. Breast Not Present- Breast Mass, Breast Pain, Nipple Discharge and Skin Changes. Cardiovascular Not Present- Chest Pain, Difficulty Breathing Lying Down, Leg Cramps, Palpitations, Rapid Heart Rate, Shortness of Breath and Swelling of Extremities. Gastrointestinal Present- Hemorrhoids. Not Present- Abdominal Pain, Bloating, Bloody Stool, Change in Bowel Habits, Chronic diarrhea, Constipation, Difficulty Swallowing, Excessive gas, Gets full quickly at meals, Indigestion, Nausea, Rectal Pain and Vomiting. Male Genitourinary Not Present- Blood in Urine, Change in Urinary Stream, Frequency, Impotence, Nocturia, Painful Urination, Urgency and Urine Leakage. Neurological Not Present- Decreased Memory and Difficulty Speaking. Endocrine Not Present- Appetite Changes and Cold Intolerance. Hematology Not Present- Abnormal Bleeding, Blood Clots and Blood Thinners.  Vitals Jonathan [name] CMA; 04/29/2019 10:05 AM) 04/29/2019 10:04 AM Weight: 134.6 lb Height: 65in Body Surface  Area: 1.67 m Body Mass Index: 22.4 kg/m  Temp.: 98.90F(Tympanic)  Pulse: 66 (Regular)  BP: 130/82 (Sitting, Left Arm, Standard)  Physical Exam Jonathan Gave M. Haik Mahoney MD; 04/29/2019 10:31 AM) The physical exam findings are as follows: Note:Constitutional: No acute distress; conversant; no deformities; wearing mask Eyes: Moist conjunctiva; no lid lag; anicteric sclerae; pupils equal and round Neck: Trachea midline; no palpable thyromegaly; tracheal os with cap in place Lungs: Normal respiratory effort; no tactile fremitus CV: Regular rate and rhythm; no palpable thrill; no pitting edema GI: Abdomen soft, nontender, nondistended; no palpable hepatosplenomegaly Anorectal: No external perianal skin changes; DRE - normal tone, no palpable masses Anoscopy: Circumferential anoscopy demonstrates polypoid lesion in anal canal left posterior, possible one at left lateral as well; no fissures or large internal hemorrhoids; no masses or ulcerations MSK: Normal gait; no clubbing/cyanosis Psychiatric: Appropriate affect; alert and oriented 3 Lymphatic: No palpable inguinal or axillary lymphadenopathy **A chaperone, [employer], was present for this encounter    Assessment & Plan Jonathan Gave M. Reeta Kuk MD; 04/29/2019 10:32 AM) ANAL POLYP (K62.0) Story: 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 The pathophysiology of anal polyps and HGSIL was discussed at length with associated pictures and illustrations. We discussed that this is a disease caused by the human papilloma virus. We discussed this is a sexually transmitted infection and risk of spread to sexual partners. We discussed importance of safe sex practices as well. We discussed long-term  risks of anal cancer and importance of surveillance moving forward. We discussed again the lesion that was seen and biopsied that had these findings, proceeding with anorectal examination anesthesia and excision of anal canal polypoid lesion. We discussed removing anything else that we see at the time of surgery that looks abnormal in the anal canal as well. -The planned procedure, material risks (including, but not limited to, pain, bleeding, infection, scarring, need for blood transfusion, damage to anal sphincter, need for additional procedures, recurrence of lesion, long-term developement of anal cancer despite this lesion being removed at this surgery, pneumonia, heart attack, stroke, death) benefits and alternatives to surgery were discussed at length. The patient's questions were answered to his satisfaction, he voiced understanding and elected to proceed with surgery. Additionally, we discussed typical postoperative expectations and the recovery process. 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

## 2019-04-30 ENCOUNTER — Encounter: Payer: Self-pay | Admitting: Internal Medicine

## 2019-04-30 ENCOUNTER — Ambulatory Visit: Payer: Medicaid Other | Admitting: Internal Medicine

## 2019-04-30 ENCOUNTER — Other Ambulatory Visit: Payer: Self-pay

## 2019-04-30 VITALS — BP 154/101 | HR 108 | Temp 98.5°F | Ht 65.0 in | Wt 132.6 lb

## 2019-04-30 DIAGNOSIS — M170 Bilateral primary osteoarthritis of knee: Secondary | ICD-10-CM

## 2019-04-30 DIAGNOSIS — Z79890 Hormone replacement therapy (postmenopausal): Secondary | ICD-10-CM

## 2019-04-30 DIAGNOSIS — Z79899 Other long term (current) drug therapy: Secondary | ICD-10-CM | POA: Diagnosis not present

## 2019-04-30 DIAGNOSIS — G8929 Other chronic pain: Secondary | ICD-10-CM

## 2019-04-30 DIAGNOSIS — I10 Essential (primary) hypertension: Secondary | ICD-10-CM | POA: Diagnosis not present

## 2019-04-30 DIAGNOSIS — Z87891 Personal history of nicotine dependence: Secondary | ICD-10-CM | POA: Diagnosis not present

## 2019-04-30 DIAGNOSIS — M25561 Pain in right knee: Secondary | ICD-10-CM

## 2019-04-30 DIAGNOSIS — E039 Hypothyroidism, unspecified: Secondary | ICD-10-CM

## 2019-04-30 MED ORDER — LEVOTHYROXINE SODIUM 150 MCG PO TABS: 150.0000 ug | ORAL_TABLET | Freq: Every day | ORAL | 5 refills | Status: DC

## 2019-04-30 NOTE — Progress Notes (Signed)
Subjective:   Patient ID: SIMAO SAPUTO male   DOB: June 29, 1960 59 y.o.   MRN: [id number]  HPI: Mr.Wassim E Mignano is a 59 y.o. male with past medical history outlined below here for bilateral knee pain. For the details of today's visit, please refer to the assessment and plan.   Past Medical History:  Diagnosis Date  . Allergic rhinitis   . Arthritis    Bilateral knee arthritis for about 20 years. Not taking any medications, believes in nature processes  . Colon polyp   . Dental caries    prechemoradiation therapy, periodontitis  . Elevated blood pressure reading    White Coat Hypertension  . GERD (gastroesophageal reflux disease)   . Headache   . History of radiation therapy 09/11/16- 10/30/16   Larynx 70 Gy in 35 fractions  . HLD (hyperlipidemia)   . Hypothyroidism    radiation induced hypothyroidism  . Internal hemorrhoids   . Laryngeal cancer ([place])   . Pneumonia    as a child  . Tubular adenoma of colon    Current Outpatient Medications  Medication Sig Dispense Refill  . amLODipine (NORVASC) 5 MG tablet TAKE 1 TABLET BY MOUTH ONCE DAILY 30 tablet 2  . levothyroxine (SYNTHROID) 150 MCG tablet Take 1 tablet (150 mcg total) by mouth daily before breakfast. 30 tablet 2  . omeprazole (PRILOSEC) 20 MG capsule Take 20 mg by mouth daily.     No current facility-administered medications for this visit.   Family History  Problem Relation Age of Onset  . Prostate cancer Maternal Uncle 66       On radiation therapy now, diagnosed in around June 2012  . Colon cancer Maternal Uncle   . Heart attack Paternal Grandfather        Diet due to MI  . Lung cancer Paternal Aunt   . Brain cancer Paternal Aunt   . Kidney disease Maternal Uncle   . Hypertension Mother   . CAD Father   . Colon polyps Neg Hx   . Diabetes Neg Hx   . Gallbladder disease Neg Hx   . Esophageal cancer Neg Hx    Social History   Socioeconomic History  . Marital status: Single    Spouse name: Not on file  .  Number of children: 4  . Years of education: Not on file  . Highest education level: Not on file  Occupational History  . 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 Smoking status: Former Smoker    Packs/day: 0.50    Years: 30.00    Pack years: 15.00    Types: Cigarettes    Quit date: 12/31/2015    Years since quitting: 3.3  . Smokeless tobacco: Never Used  . Tobacco comment: not everyday-working on stopping completely   Substance and Sexual Activity  . Alcohol use: No    Alcohol/week: 2.0 standard drinks    Types: 2 Cans of beer per week  . Drug use: No    Frequency: 2.0 times per week    Comment: denies  . Sexual activity: Yes    Partners: Female  Other Topics Concern  . Not on file  Social History Narrative   Lives in [place] with his fiance.   Moved to [place] 11 years before. Used to live in [place] for 20 years before that.   Has 4 healthy kids from previous wife- separated now.   Is not working full-time since last 2 and half years- since early 2010.  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   Social Determinants of Health   Financial Resource Strain:   . Difficulty of Paying Living Expenses: Not on file  Food Insecurity:   . Worried About Charity fundraiser in the Last Year: Not on file  . Ran Out of Food in the Last Year: Not on file  Transportation Needs:   . Lack of Transportation (Medical): Not on file  . Lack of Transportation (Non-Medical): Not on file  Physical Activity:   . Days of Exercise per Week: Not on file  . Minutes of Exercise per Session: Not on file  Stress:   . Feeling of Stress : Not on file  Social Connections:   . Frequency of Communication with Friends and Family: Not on file  . Frequency of Social Gatherings with Friends and Family: Not on file  . Attends Religious Services: Not on file  . Active Member of Clubs or Organizations: Not on file  . Attends Theatre manager Meetings: Not on file  . Marital Status: Not on file    Review of Systems: Review of Systems  Cardiovascular: Negative for chest pain.  Musculoskeletal: Positive for joint pain.     Objective:  Physical Exam:  Vitals:   04/30/19 0953  BP: (!) 165/97  Pulse: (!) 108  Temp: 98.5 F (36.9 C)  TempSrc: Oral  SpO2: 98%  Weight: 132 lb 9.6 oz (60.1 kg)    Constitutional: NAD, appears comfortable HEENT:Stoma with tracheostomy plug, no erythema or drainage Cardiovascular:Tachycardic but regular, no m/r/g Pulmonary/Chest: CTAB, no wheezes, rales, or rhonchi.  Extremities: Warm and well perfused. No edema.  Psychiatric: Normal mood and affect  Assessment & Plan:   See Encounters Tab for problem based charting.

## 2019-04-30 NOTE — Patient Instructions (Signed)
Jonathan [name],   It was a pleasure to meet you. I am glad you are doing well! Please continue to take your medicine as previously prescribed. Follow up with me again in 4 months.   If you have any questions or concerns, call our clinic at [phone] or after hours call [phone] and ask for the internal medicine resident on call. Thank you!  Dr. Philipp Ovens

## 2019-04-30 NOTE — Assessment & Plan Note (Signed)
Blood pressure is uncontrolled today. He is taking amlodipine 5 mg daily. He was previously controlled on this regimen. Will monitor for now. If persistently elevated at follow up with increase to 10 mg.

## 2019-04-30 NOTE — Assessment & Plan Note (Signed)
Patient is here for follow-up of bilateral knee pain secondary to osteoarthritis.  His symptoms are well controlled with intermittent intra-articular steroid injections.  His last injection was over 3 months ago.  This provided him with symptom relief that lasted up until couple weeks ago.  Requesting repeat injections today.  Procedure Note: Bilateral Knee injections  Indication:Bilateral Knee Osteoarthritis  The patient was provided with risks, benefits, and alternatives to intraarticular injection. He consented to intraarticular knee injectionsfor bilateral knee pain due to osteoarthritis. After a time out was preformed, the knee was prepped in a sterile fashion. A mixture of 1 cc 1% lidocaine and 40 mg of Kenalog was injected into the knee using a 25gauge1 1/2 inchneedle. The needle was inserted at the lateral inferior patellar aspect of the knee whilepatient wasin aseated position.The left knee space was enteredwithoutdifficulty, entered successfully after 1stattempt. The right knee space was entered successfully on the 2nd attempt. The patient tolerated the procedures well without complication.

## 2019-04-30 NOTE — Assessment & Plan Note (Signed)
Refilled synthroid 150 mcg. Repeat TSH at follow up.

## 2019-05-02 DIAGNOSIS — Z430 Encounter for attention to tracheostomy: Secondary | ICD-10-CM | POA: Diagnosis not present

## 2019-05-06 NOTE — Patient Instructions (Addendum)
DUE TO COVID-19 ONLY ONE VISITOR IS ALLOWED TO COME WITH YOU AND STAY IN THE WAITING ROOM ONLY DURING PRE OP AND PROCEDURE DAY OF SURGERY. THE 1 VISITOR MAY VISIT WITH YOU AFTER SURGERY IN YOUR PRIVATE ROOM DURING VISITING HOURS ONLY!  YOU NEED TO HAVE A COVID 19 TEST ON__2/8/21_____ @_9 :15______, THIS TEST MUST BE DONE BEFORE SURGERY, COME  [address] , [address].  ([place]) ONCE YOUR COVID TEST IS COMPLETED, PLEASE BEGIN THE QUARANTINE INSTRUCTIONS AS OUTLINED IN YOUR HANDOUT.                [name]    Your procedure is scheduled on: 05/15/19   Report to [facility] Main  Entrance   Report to admitting at  8:00 AM     Call this number if you have problems the morning of surgery [phone]    Remember: Do not eat food or drink liquids :After Midnight.   BRUSH YOUR TEETH MORNING OF SURGERY AND RINSE YOUR MOUTH OUT, NO CHEWING GUM CANDY OR MINTS.   Follow any bowel prep instructions from the Dr's office.   Take these medicines the morning of surgery with A SIP OF WATER:  Amlodipine, Synthroid, Omeprizole                                  You may not have any metal on your body including               piercings  Do not wear jewelry,  lotions, powders or  deodorant                        Men may shave face and neck.   Do not bring valuables to the hospital. [place].  Contacts, dentures or bridgework may not be worn into surgery.      Patients discharged the day of surgery will not be allowed to drive home.  IF YOU ARE HAVING SURGERY AND GOING HOME THE SAME DAY, YOU MUST HAVE AN ADULT TO DRIVE YOU HOME AND BE WITH YOU FOR 24 HOURS.  YOU MAY GO HOME BY TAXI OR UBER OR ORTHERWISE, BUT AN ADULT MUST ACCOMPANY YOU HOME AND STAY WITH YOU FOR 24 HOURS.  Name and phone number of your driver:  Special Instructions: N/A              Please read over the following fact sheets you were  given: _____________________________________________________________________             [facility] - Preparing for Surgery  Before surgery, you can play an important role.   Because skin is not sterile, your skin needs to be as free of germs as possible.   You can reduce the number of germs on your skin by washing with CHG (chlorahexidine gluconate) soap before surgery.   CHG is an antiseptic cleaner which kills germs and bonds with the skin to continue killing germs even after washing. Please DO NOT use if you have an allergy to CHG or antibacterial soaps.   If your skin becomes reddened/irritated stop using the CHG and inform your nurse when you arrive at Short Stay. .  You may shave your face/neck.  Please follow these instructions carefully:  1.  Shower with CHG Soap the night before surgery and the  morning of Surgery.  2.  If you choose to wash your hair, wash your hair first as usual with your  normal  shampoo.  3.  After you shampoo, rinse your hair and body thoroughly to remove the  shampoo.                                        4.  Use CHG as you would any other liquid soap.  You can apply chg directly  to the skin and wash                       Gently with a scrungie or clean washcloth.  5.  Apply the CHG Soap to your body ONLY FROM THE NECK DOWN.   Do not use on face/ open                           Wound or open sores. Avoid contact with eyes, ears mouth and genitals (private parts).                       Wash face,  Genitals (private parts) with your normal soap.             6.  Wash thoroughly, paying special attention to the area where your surgery  will be performed.  7.  Thoroughly rinse your body with warm water from the neck down.  8.  DO NOT shower/wash with your normal soap after using and rinsing off  the CHG Soap.             9.  Pat yourself dry with a clean towel.            10.  Wear clean pajamas.            11.  Place clean sheets on your bed the night of  your first shower and do not  sleep with pets. Day of Surgery : Do not apply any lotions/deodorants the morning of surgery.  Please wear clean clothes to the hospital/surgery center.  FAILURE TO FOLLOW THESE INSTRUCTIONS MAY RESULT IN THE CANCELLATION OF YOUR SURGERY PATIENT SIGNATURE_________________________________  NURSE SIGNATURE__________________________________  ________________________________________________________________________

## 2019-05-07 ENCOUNTER — Encounter (HOSPITAL_COMMUNITY): Payer: Self-pay

## 2019-05-07 ENCOUNTER — Other Ambulatory Visit: Payer: Self-pay

## 2019-05-07 ENCOUNTER — Encounter (HOSPITAL_COMMUNITY)
Admission: RE | Admit: 2019-05-07 | Discharge: 2019-05-07 | Disposition: A | Discharge status: Home / Self Care | Payer: Medicaid Other | Source: Ambulatory Visit | Attending: Surgery | Admitting: Surgery

## 2019-05-07 NOTE — Progress Notes (Signed)
PCP - Dr. Rosine Door Cardiologist - none  Chest x-ray - 2015 EKG - 09/17/18 Stress Test - no ECHO - no Cardiac Cath - no  Sleep Study - no CPAP -   Fasting Blood Sugar - Pt is diet controlled diabetic Checks Blood Sugar _____ times a day  Blood Thinner Instructions:NA Aspirin Instructions: Last Dose:  Anesthesia review:   Patient denies shortness of breath, fever, cough and chest pain at PAT appointment  yes Patient verbalized understanding of instructions that were given to them at the PAT appointment. Patient was also instructed that they will need to review over the PAT instructions again at home before surgery. 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

## 2019-05-08 ENCOUNTER — Encounter (HOSPITAL_COMMUNITY)
Admission: RE | Admit: 2019-05-08 | Discharge: 2019-05-08 | Disposition: A | Discharge status: Home / Self Care | Payer: Medicaid Other | Source: Ambulatory Visit | Attending: Surgery | Admitting: Surgery

## 2019-05-08 DIAGNOSIS — Z01812 Encounter for preprocedural laboratory examination: Secondary | ICD-10-CM | POA: Diagnosis not present

## 2019-05-08 DIAGNOSIS — R490 Dysphonia: Secondary | ICD-10-CM | POA: Diagnosis not present

## 2019-05-08 LAB — CBC WITH DIFFERENTIAL/PLATELET
Abs Immature Granulocytes: 0.05 10*3/uL (ref 0.00–0.07)
Basophils Absolute: 0 10*3/uL (ref 0.0–0.1)
Basophils Relative: 0 %
Eosinophils Absolute: 0.1 10*3/uL (ref 0.0–0.5)
Eosinophils Relative: 1 %
HCT: 44.6 % (ref 39.0–52.0)
Hemoglobin: 14.5 g/dL (ref 13.0–17.0)
Immature Granulocytes: 1 %
Lymphocytes Relative: 14 %
Lymphs Abs: 1.1 10*3/uL (ref 0.7–4.0)
MCH: 30.1 pg (ref 26.0–34.0)
MCHC: 32.5 g/dL (ref 30.0–36.0)
MCV: 92.7 fL (ref 80.0–100.0)
Monocytes Absolute: 0.9 10*3/uL (ref 0.1–1.0)
Monocytes Relative: 12 %
Neutro Abs: 5.9 10*3/uL (ref 1.7–7.7)
Neutrophils Relative %: 72 %
Platelets: 321 10*3/uL (ref 150–400)
RBC: 4.81 MIL/uL (ref 4.22–5.81)
RDW: 15.3 % (ref 11.5–15.5)
WBC: 8.1 10*3/uL (ref 4.0–10.5)
nRBC: 0 % (ref 0.0–0.2)

## 2019-05-08 LAB — COMPREHENSIVE METABOLIC PANEL
ALT: 15 U/L (ref 0–44)
AST: 18 U/L (ref 15–41)
Albumin: 4.5 g/dL (ref 3.5–5.0)
Alkaline Phosphatase: 58 U/L (ref 38–126)
Anion gap: 11 (ref 5–15)
BUN: 12 mg/dL (ref 8–23)
CO2: 26 mmol/L (ref 22–32)
Calcium: 9.4 mg/dL (ref 8.9–10.3)
Chloride: 101 mmol/L (ref 98–111)
Creatinine, Ser: 0.84 mg/dL (ref 0.61–1.24)
GFR calc Af Amer: >60 mL/min (ref >60)
GFR calc non Af Amer: >60 mL/min (ref >60)
Glucose, Bld: 107 mg/dL — ABNORMAL HIGH (ref 70–99)
Potassium: 3.6 mmol/L (ref 3.5–5.1)
Sodium: 138 mmol/L (ref 135–145)
Total Bilirubin: 0.5 mg/dL (ref 0.3–1.2)
Total Protein: 7.7 g/dL (ref 6.5–8.1)

## 2019-05-08 LAB — PROTIME-INR
INR: 0.9 (ref 0.8–1.2)
Prothrombin Time: 12.5 seconds (ref 11.4–15.2)

## 2019-05-08 LAB — APTT: aPTT: 32 seconds (ref 24–36)

## 2019-05-09 DIAGNOSIS — Z430 Encounter for attention to tracheostomy: Secondary | ICD-10-CM | POA: Diagnosis not present

## 2019-05-12 ENCOUNTER — Other Ambulatory Visit (HOSPITAL_COMMUNITY)
Admission: RE | Admit: 2019-05-12 | Discharge: 2019-05-12 | Disposition: A | Discharge status: Home / Self Care | Payer: Medicaid Other | Source: Ambulatory Visit | Attending: Surgery | Admitting: Surgery

## 2019-05-12 DIAGNOSIS — Z20822 Contact with and (suspected) exposure to covid-19: Secondary | ICD-10-CM | POA: Insufficient documentation

## 2019-05-12 DIAGNOSIS — Z01812 Encounter for preprocedural laboratory examination: Secondary | ICD-10-CM | POA: Insufficient documentation

## 2019-05-12 LAB — SARS CORONAVIRUS 2 (TAT 6-24 HRS): SARS Coronavirus 2: NEGATIVE

## 2019-05-13 NOTE — Progress Notes (Addendum)
Anesthesia Chart Review   Case: [id number] Date/Time: 05/15/19 0945   Procedure: EXAM UNDER ANESTHESIA WITH EXCISION OF ANAL CANAL POLYP (N/A )   Anesthesia type: General   Pre-op diagnosis: ANAL POLYP-HIGH GRADE SQUAMOUS INTRAEPITHELIAL NEOPLASM   Location: [facility] ROOM 01 / [facility]   Surgeons: Ileana Roup, MD      DISCUSSION:59 y.o. former smoker (15 pack years, quit 12/31/15) with h/o HLD, GERD, hypothyroidism, laryngeal cancer s/p cXRT, laryngectomy with voice box (TEP change 09/25/2018), HTN, anal polyp scheduled for above procedure 05/15/19 with Dr. Nadeen Landau.   Pt seen by PCP, Dr. Velna Ochs, 04/30/19.  Elevated BP at this visit.  Per OV note Dr. Philipp Ovens monitoring and will consider increasing amlodipine if needed, recently started on 5mg  Amlodipine.  Elevated BP at PAT visit, 158/113.  Pt advised to contact PCP regarding BP.  Per previous PCP note, "Patient has a prior diagnosis of whitecoat hypertension that has been extensively documented with home ambulatory blood pressure monitoring on multiple occasions.  Today his blood pressure is again elevated, 167/118.  I called patient's wife to discuss home blood pressure readings with patient's permission.  Wife checks his blood pressure almost daily and reports that it is also been slightly elevated at home, ~ 130-135/ 90s since his last visit".  Will evaluate DOS.   S/p TEP change 09/25/2018 due to leakage.  Seen by Dr. Constance Holster 01/23/2019, doing well with no evidence of leakage. 6 month follow up recommended.   Pt continues with speech therapy.  Per last note 05/08/19.    Current TEP Type: Provox Vega 20 [place] Current TEP size: 29mm  Discussed with Dr. Roanna Banning.  Anticipate pt can proceed with planned procedure barring acute status change.   VS: BP (!) 158/113 (BP Location: Left Arm)   Pulse 100   Temp 37.6 C (Oral)   Resp 18   Ht 5\' 5"  (1.651 m)   Wt 60 kg   SpO2 99%   BMI 22.03 kg/m   PROVIDERS: Velna Ochs,  MD is PCP   Izora Gala, MD Otolaryngologist  LABS: Labs reviewed: Acceptable for surgery. (all labs ordered are listed, but only abnormal results are displayed)  Labs Reviewed  COMPREHENSIVE METABOLIC PANEL - Abnormal; Notable for the following components:      Result Value   Glucose, Bld 107 (*)    All other components within normal limits  APTT  CBC WITH DIFFERENTIAL/PLATELET  PROTIME-INR     IMAGES:   EKG: 09/17/18 Rate 93 bpm Normal sinus rhythm   CV:  Past Medical History:  Diagnosis Date  . Allergic rhinitis   . Arthritis    Bilateral knee arthritis for about 20 years. Not taking any medications, believes in nature processes  . Colon polyp   . Dental caries    prechemoradiation therapy, periodontitis  . Elevated blood pressure reading    White Coat Hypertension  . GERD (gastroesophageal reflux disease)   . Headache   . History of radiation therapy 09/11/16- 10/30/16   Larynx 70 Gy in 35 fractions  . HLD (hyperlipidemia)   . Hypothyroidism    radiation induced hypothyroidism  . Internal hemorrhoids   . Laryngeal cancer ([place])   . Pneumonia    as a child  . Tubular adenoma of colon     Past Surgical History:  Procedure Laterality Date  . COLONOSCOPY  03/18/2012  . DIRECT LARYNGOSCOPY N/A 07/06/2017   Procedure: DIRECT LARYNGOSCOPY;  Surgeon: Izora Gala, MD;  Location: [facility];  Service: ENT;  Laterality: N/A;  . ESOPHAGOSCOPY  04/24/2016   Procedure: ESOPHAGOSCOPY;  Surgeon: Izora Gala, MD;  Location: [facility];  Service: ENT;;  . Everlean Alstrom GENERIC HISTORICAL  06/02/2016   IR US GUIDE VASC ACCESS RIGHT 06/02/2016 Sandi Mariscal, MD [facility]  . IR GENERIC HISTORICAL  06/02/2016   IR FLUORO GUIDE PORT INSERTION RIGHT 06/02/2016 Sandi Mariscal, MD [facility]  . IR REMOVAL TUN ACCESS W/ PORT W/O FL MOD SED  04/16/2018  . LARYNGETOMY N/A 08/10/2017   Procedure: TOTAL LARYNGECTOMY;  Surgeon: Izora Gala, MD;  Location: [facility];  Service: ENT;  Laterality: N/A;  . LARYNGOSCOPY   04/24/2016   Procedure: LARYNGOSCOPY;  Surgeon: Izora Gala, MD;  Location: [facility];  Service: ENT;;  . MULTIPLE EXTRACTIONS WITH ALVEOLOPLASTY N/A 05/26/2016   Procedure: Extraction of tooth #'s 2,5,6,8-13, 21-28 with alveoloplasty, bilateral mandibular tori reductions and buccal exostoses reductions of maxillary left, maxillary right, and mandibular left quadrants.;  Surgeon: Lenn Cal, DDS;  Location: [facility];  Service: Oral Surgery;  Laterality: N/A;  . STOMAPLASTY N/A 06/05/2018   Procedure: Tracheal STOMAPLASTY;  Surgeon: Izora Gala, MD;  Location: [facility];  Service: ENT;  Laterality: N/A;  . TRACHEAL ESOPHAGEAL PROSTHESIS (TEP) CHANGE N/A 09/25/2018   Procedure: TRACHEAL ESOPHAGEAL PROSTHESIS (TEP) CHANGE;  Surgeon: Izora Gala, MD;  Location: [facility];  Service: ENT;  Laterality: N/A;  . TRACHEOSTOMY TUBE PLACEMENT N/A 04/24/2016   Procedure: TRACHEOSTOMY;  Surgeon: Izora Gala, MD;  Location: [facility];  Service: ENT;  Laterality: N/A;    MEDICATIONS: . acetaminophen (TYLENOL) 500 MG tablet  . amLODipine (NORVASC) 5 MG tablet  . Chlorpheniramine-PSE-Ibuprofen (ADVIL ALLERGY SINUS) 2-30-200 MG TABS  . ibuprofen (ADVIL) 200 MG tablet  . levothyroxine (SYNTHROID) 150 MCG tablet  . omeprazole (PRILOSEC) 20 MG capsule  . Wheat Dextrin (BENEFIBER) POWD   No current facility-administered medications for this encounter.   Konrad Felix, PA-C [facility]-Surgical Testing [phone] 05/13/19  2:00 PM

## 2019-05-14 MED ORDER — BUPIVACAINE LIPOSOME 1.3 % IJ SUSP
20.0000 mL | INTRAMUSCULAR | Status: DC
  Filled 2019-05-14: qty 20

## 2019-05-14 NOTE — Anesthesia Preprocedure Evaluation (Addendum)
Anesthesia Evaluation  Patient identified by MRN, date of birth, ID band Patient awake    Reviewed: Allergy & Precautions, H&P , NPO status , Patient's Chart, lab work & pertinent test results, reviewed documented beta blocker date and time   Airway Mallampati: II  TM Distance: >3 FB Neck ROM: full    Dental no notable dental hx.    Pulmonary neg pulmonary ROS, former smoker,    Pulmonary exam normal breath sounds clear to auscultation       Cardiovascular Exercise Tolerance: Good hypertension,  Rhythm:regular Rate:Normal     Neuro/Psych  Headaches, negative psych ROS   GI/Hepatic Neg liver ROS, GERD  Medicated,  Endo/Other  Hypothyroidism   Renal/GU negative Renal ROS  negative genitourinary   Musculoskeletal  (+) Arthritis , Osteoarthritis,    Abdominal   Peds  Hematology negative hematology ROS (+)   Anesthesia Other Findings   Reproductive/Obstetrics negative OB ROS                                                             Anesthesia Evaluation  Patient identified by MRN, date of birth, ID band Patient awake    Reviewed: Allergy & Precautions, NPO status , Patient's Chart, lab work & pertinent test results  Airway Mallampati: Trach   Neck ROM: Full    Dental   Pulmonary former smoker,    Pulmonary exam normal        Cardiovascular Normal cardiovascular exam     Neuro/Psych    GI/Hepatic GERD  Medicated and Controlled,  Endo/Other  Hypothyroidism   Renal/GU      Musculoskeletal   Abdominal   Peds  Hematology   Anesthesia Other Findings   Reproductive/Obstetrics                            Anesthesia Physical Anesthesia Plan  ASA: III  Anesthesia Plan: General   Post-op Pain Management:    Induction: Intravenous  PONV Risk Score and Plan: 2 and Ondansetron, Treatment may vary due to age or medical condition and  Midazolam  Airway Management Planned: Tracheostomy  Additional Equipment:   Intra-op Plan:   Post-operative Plan: Extubation in OR  Informed Consent: I have reviewed the patients History and Physical, chart, labs and discussed the procedure including the risks, benefits and alternatives for the proposed anesthesia with the patient or authorized representative who has indicated his/her understanding and acceptance.       Plan Discussed with: CRNA and Surgeon  Anesthesia Plan Comments: (PAT note written 06/04/2018 by Myra Gianotti, PA-C. )       Anesthesia Quick Evaluation  Anesthesia Physical Anesthesia Plan  ASA: III  Anesthesia Plan: General   Post-op Pain Management:    Induction:   PONV Risk Score and Plan:   Airway Management Planned: Tracheostomy  Additional Equipment:   Intra-op Plan:   Post-operative Plan:   Informed Consent: I have reviewed the patients History and Physical, chart, labs and discussed the procedure including the risks, benefits and alternatives for the proposed anesthesia with the patient or authorized representative who has indicated his/her understanding and acceptance.     Dental Advisory Given  Plan Discussed with: CRNA and Anesthesiologist  Anesthesia Plan Comments: (See PAT note 05/08/2019,  Konrad Felix, PA-C)       Anesthesia Quick Evaluation

## 2019-05-15 ENCOUNTER — Encounter (HOSPITAL_COMMUNITY)
Admission: RE | Disposition: A | Discharge status: Home / Self Care | Payer: Self-pay | Source: Other Acute Inpatient Hospital | Attending: Surgery

## 2019-05-15 ENCOUNTER — Encounter (HOSPITAL_COMMUNITY): Payer: Self-pay | Admitting: Surgery

## 2019-05-15 ENCOUNTER — Ambulatory Visit (HOSPITAL_COMMUNITY): Payer: Medicaid Other | Admitting: Anesthesiology

## 2019-05-15 ENCOUNTER — Ambulatory Visit (HOSPITAL_COMMUNITY)
Admission: RE | Admit: 2019-05-15 | Discharge: 2019-05-15 | Disposition: A | Discharge status: Home / Self Care | Payer: Medicaid Other | Source: Other Acute Inpatient Hospital | Attending: Surgery | Admitting: Surgery

## 2019-05-15 ENCOUNTER — Ambulatory Visit (HOSPITAL_COMMUNITY): Payer: Medicaid Other | Admitting: Physician Assistant

## 2019-05-15 DIAGNOSIS — Z8521 Personal history of malignant neoplasm of larynx: Secondary | ICD-10-CM | POA: Insufficient documentation

## 2019-05-15 DIAGNOSIS — F17200 Nicotine dependence, unspecified, uncomplicated: Secondary | ICD-10-CM | POA: Diagnosis not present

## 2019-05-15 DIAGNOSIS — I10 Essential (primary) hypertension: Secondary | ICD-10-CM | POA: Diagnosis not present

## 2019-05-15 DIAGNOSIS — E039 Hypothyroidism, unspecified: Secondary | ICD-10-CM | POA: Diagnosis not present

## 2019-05-15 DIAGNOSIS — Z963 Presence of artificial larynx: Secondary | ICD-10-CM | POA: Insufficient documentation

## 2019-05-15 DIAGNOSIS — Z923 Personal history of irradiation: Secondary | ICD-10-CM | POA: Diagnosis not present

## 2019-05-15 DIAGNOSIS — K219 Gastro-esophageal reflux disease without esophagitis: Secondary | ICD-10-CM | POA: Diagnosis not present

## 2019-05-15 DIAGNOSIS — Z79890 Hormone replacement therapy (postmenopausal): Secondary | ICD-10-CM | POA: Insufficient documentation

## 2019-05-15 DIAGNOSIS — Z86010 Personal history of colonic polyps: Secondary | ICD-10-CM | POA: Insufficient documentation

## 2019-05-15 DIAGNOSIS — K6282 Dysplasia of anus: Secondary | ICD-10-CM | POA: Diagnosis present

## 2019-05-15 DIAGNOSIS — Z79899 Other long term (current) drug therapy: Secondary | ICD-10-CM | POA: Insufficient documentation

## 2019-05-15 DIAGNOSIS — M1990 Unspecified osteoarthritis, unspecified site: Secondary | ICD-10-CM | POA: Insufficient documentation

## 2019-05-15 DIAGNOSIS — A630 Anogenital (venereal) warts: Secondary | ICD-10-CM | POA: Diagnosis not present

## 2019-05-15 DIAGNOSIS — R85613 High grade squamous intraepithelial lesion on cytologic smear of anus (HGSIL): Secondary | ICD-10-CM | POA: Diagnosis not present

## 2019-05-15 DIAGNOSIS — E440 Moderate protein-calorie malnutrition: Secondary | ICD-10-CM | POA: Diagnosis not present

## 2019-05-15 DIAGNOSIS — K648 Other hemorrhoids: Secondary | ICD-10-CM | POA: Diagnosis not present

## 2019-05-15 HISTORY — PX: EVALUATION UNDER ANESTHESIA WITH HEMORRHOIDECTOMY: SHX5624

## 2019-05-15 SURGERY — EXAM UNDER ANESTHESIA WITH HEMORRHOIDECTOMY
Anesthesia: General

## 2019-05-15 MED ORDER — OXYCODONE HCL 5 MG/5ML PO SOLN: 5.0000 mg | Freq: Once | ORAL | Status: DC | PRN

## 2019-05-15 MED ORDER — ROCURONIUM BROMIDE 10 MG/ML (PF) SYRINGE
PREFILLED_SYRINGE | INTRAVENOUS | Status: DC | PRN
  Administered 2019-05-15: 40 mg via INTRAVENOUS

## 2019-05-15 MED ORDER — PROPOFOL 10 MG/ML IV BOLUS
INTRAVENOUS | Status: AC
  Filled 2019-05-15: qty 20

## 2019-05-15 MED ORDER — MIDAZOLAM HCL 2 MG/2ML IJ SOLN
INTRAMUSCULAR | Status: AC
  Filled 2019-05-15: qty 2

## 2019-05-15 MED ORDER — ONDANSETRON HCL 4 MG/2ML IJ SOLN
INTRAMUSCULAR | Status: DC | PRN
  Administered 2019-05-15: 4 mg via INTRAVENOUS

## 2019-05-15 MED ORDER — BUPIVACAINE HCL 0.25 % IJ SOLN
INTRAMUSCULAR | Status: AC
  Filled 2019-05-15: qty 1

## 2019-05-15 MED ORDER — TRAMADOL HCL 50 MG PO TABS: 50.0000 mg | ORAL_TABLET | Freq: Four times a day (QID) | ORAL | 0 refills | Status: AC | PRN

## 2019-05-15 MED ORDER — DEXAMETHASONE SODIUM PHOSPHATE 10 MG/ML IJ SOLN
INTRAMUSCULAR | Status: DC | PRN
  Administered 2019-05-15: 8 mg via INTRAVENOUS

## 2019-05-15 MED ORDER — OXYCODONE HCL 5 MG PO TABS: 5.0000 mg | ORAL_TABLET | Freq: Once | ORAL | Status: DC | PRN

## 2019-05-15 MED ORDER — LIDOCAINE 2% (20 MG/ML) 5 ML SYRINGE
INTRAMUSCULAR | Status: AC
  Filled 2019-05-15: qty 5

## 2019-05-15 MED ORDER — BUPIVACAINE LIPOSOME 1.3 % IJ SUSP
INTRAMUSCULAR | Status: DC | PRN
  Administered 2019-05-15: 20 mL

## 2019-05-15 MED ORDER — BUPIVACAINE HCL (PF) 0.25 % IJ SOLN
INTRAMUSCULAR | Status: DC | PRN
  Administered 2019-05-15: 30 mL

## 2019-05-15 MED ORDER — ACETAMINOPHEN 325 MG PO TABS: 325.0000 mg | ORAL_TABLET | ORAL | Status: DC | PRN

## 2019-05-15 MED ORDER — PHENYLEPHRINE 40 MCG/ML (10ML) SYRINGE FOR IV PUSH (FOR BLOOD PRESSURE SUPPORT)
PREFILLED_SYRINGE | INTRAVENOUS | Status: AC
  Filled 2019-05-15: qty 10

## 2019-05-15 MED ORDER — ONDANSETRON HCL 4 MG/2ML IJ SOLN: 4.0000 mg | Freq: Once | INTRAMUSCULAR | Status: DC | PRN

## 2019-05-15 MED ORDER — ONDANSETRON HCL 4 MG/2ML IJ SOLN
INTRAMUSCULAR | Status: AC
  Filled 2019-05-15: qty 2

## 2019-05-15 MED ORDER — MIDAZOLAM HCL 5 MG/5ML IJ SOLN
INTRAMUSCULAR | Status: DC | PRN
  Administered 2019-05-15: 2 mg via INTRAVENOUS

## 2019-05-15 MED ORDER — SUGAMMADEX SODIUM 200 MG/2ML IV SOLN
INTRAVENOUS | Status: DC | PRN
  Administered 2019-05-15: 250 mg via INTRAVENOUS

## 2019-05-15 MED ORDER — PROPOFOL 10 MG/ML IV BOLUS
INTRAVENOUS | Status: DC | PRN
  Administered 2019-05-15: 100 mg via INTRAVENOUS

## 2019-05-15 MED ORDER — DIBUCAINE (PERIANAL) 1 % EX OINT
TOPICAL_OINTMENT | CUTANEOUS | Status: AC
  Filled 2019-05-15: qty 28

## 2019-05-15 MED ORDER — ROCURONIUM BROMIDE 10 MG/ML (PF) SYRINGE
PREFILLED_SYRINGE | INTRAVENOUS | Status: AC
  Filled 2019-05-15: qty 10

## 2019-05-15 MED ORDER — CHLORHEXIDINE GLUCONATE CLOTH 2 % EX PADS: 6.0000 | MEDICATED_PAD | Freq: Once | CUTANEOUS | Status: DC

## 2019-05-15 MED ORDER — FENTANYL CITRATE (PF) 100 MCG/2ML IJ SOLN: 25.0000 ug | INTRAMUSCULAR | Status: DC | PRN

## 2019-05-15 MED ORDER — MEPERIDINE HCL 50 MG/ML IJ SOLN: 6.2500 mg | INTRAMUSCULAR | Status: DC | PRN

## 2019-05-15 MED ORDER — LACTATED RINGERS IV SOLN: INTRAVENOUS | Status: DC

## 2019-05-15 MED ORDER — ACETAMINOPHEN 160 MG/5ML PO SOLN: 325.0000 mg | ORAL | Status: DC | PRN

## 2019-05-15 MED ORDER — DEXAMETHASONE SODIUM PHOSPHATE 10 MG/ML IJ SOLN
INTRAMUSCULAR | Status: AC
  Filled 2019-05-15: qty 1

## 2019-05-15 MED ORDER — ACETAMINOPHEN 500 MG PO TABS: 1000.0000 mg | ORAL_TABLET | ORAL | Status: AC

## 2019-05-15 MED ORDER — PHENYLEPHRINE 40 MCG/ML (10ML) SYRINGE FOR IV PUSH (FOR BLOOD PRESSURE SUPPORT)
PREFILLED_SYRINGE | INTRAVENOUS | Status: DC | PRN
  Administered 2019-05-15: 80 ug via INTRAVENOUS

## 2019-05-15 MED ORDER — FENTANYL CITRATE (PF) 250 MCG/5ML IJ SOLN
INTRAMUSCULAR | Status: AC
  Filled 2019-05-15: qty 5

## 2019-05-15 MED ORDER — ACETAMINOPHEN 500 MG PO TABS
ORAL_TABLET | ORAL | Status: AC
  Administered 2019-05-15: 1000 mg via ORAL
  Filled 2019-05-15: qty 2

## 2019-05-15 MED ORDER — LACTATED RINGERS IV SOLN: INTRAVENOUS | Status: DC | PRN

## 2019-05-15 MED ORDER — FENTANYL CITRATE (PF) 250 MCG/5ML IJ SOLN
INTRAMUSCULAR | Status: DC | PRN
  Administered 2019-05-15: 50 ug via INTRAVENOUS
  Administered 2019-05-15: 100 ug via INTRAVENOUS
  Administered 2019-05-15: 50 ug via INTRAVENOUS

## 2019-05-15 MED ORDER — 0.9 % SODIUM CHLORIDE (POUR BTL) OPTIME
TOPICAL | Status: DC | PRN
  Administered 2019-05-15: 10:00:00 1000 mL

## 2019-05-15 MED ORDER — DIBUCAINE (PERIANAL) 1 % EX OINT
TOPICAL_OINTMENT | CUTANEOUS | Status: DC | PRN
  Administered 2019-05-15: 1 via RECTAL

## 2019-05-15 SURGICAL SUPPLY — 27 items
BRIEF STRETCH FOR OB PAD LRG (UNDERPADS AND DIAPERS) ×3 IMPLANT
COVER WAND RF STERILE (DRAPES) IMPLANT
DECANTER SPIKE VIAL GLASS SM (MISCELLANEOUS) ×3 IMPLANT
DRSG PAD ABDOMINAL 8X10 ST (GAUZE/BANDAGES/DRESSINGS) ×2 IMPLANT
ELECT REM PT RETURN 15FT ADLT (MISCELLANEOUS) ×3 IMPLANT
GAUZE SPONGE 4X4 12PLY STRL (GAUZE/BANDAGES/DRESSINGS) ×2 IMPLANT
GLOVE BIO SURGEON STRL SZ7.5 (GLOVE) ×3 IMPLANT
GLOVE INDICATOR 8.0 STRL GRN (GLOVE) ×3 IMPLANT
GOWN STRL REUS W/TWL XL LVL3 (GOWN DISPOSABLE) ×6 IMPLANT
KIT BASIN OR (CUSTOM PROCEDURE TRAY) ×3 IMPLANT
KIT TURNOVER KIT A (KITS) IMPLANT
NEEDLE HYPO 22GX1.5 SAFETY (NEEDLE) ×3 IMPLANT
PACK GENERAL/GYN (CUSTOM PROCEDURE TRAY) ×3 IMPLANT
PENCIL SMOKE EVACUATOR (MISCELLANEOUS) IMPLANT
SHEARS HARMONIC 9CM CVD (BLADE) IMPLANT
SPONGE SURGIFOAM ABS GEL 100 (HEMOSTASIS) IMPLANT
SURGILUBE 2OZ TUBE FLIPTOP (MISCELLANEOUS) ×3 IMPLANT
SUT CHROMIC 2 0 SH (SUTURE) IMPLANT
SUT CHROMIC 3 0 SH 27 (SUTURE) IMPLANT
SUT MNCRL AB 4-0 PS2 18 (SUTURE) ×3 IMPLANT
SUT VIC AB 2-0 SH 27 (SUTURE) ×2
SUT VIC AB 2-0 SH 27X BRD (SUTURE) IMPLANT
SUT VIC AB 3-0 SH 27 (SUTURE) ×2
SUT VIC AB 3-0 SH 27X BRD (SUTURE) ×1 IMPLANT
SYR 20ML LL LF (SYRINGE) ×3 IMPLANT
TOWEL OR 17X26 10 PK STRL BLUE (TOWEL DISPOSABLE) ×3 IMPLANT
TOWEL OR NON WOVEN STRL DISP B (DISPOSABLE) ×3 IMPLANT

## 2019-05-15 NOTE — Discharge Instructions (Addendum)
ANORECTAL SURGERY: POST OP INSTRUCTIONS  1. DIET: Follow a light bland diet the first 24 hours after arrival home, such as soup, liquids, crackers, etc.  Be sure to include lots of fluids daily.  Avoid fast food or heavy meals as your are more likely to get nauseated.  Eat a low fat diet the next few days after surgery.    2. Take your usually prescribed home medications unless otherwise directed.  3. PAIN CONTROL: a. It is helpful to take an over-the-counter pain medication regularly for the first few days/weeks.  Choose from the following that works best for you: i. Ibuprofen (Advil, etc) Three 228m tabs every 6 hours as needed. ii. Acetaminophen (Tylenol, etc) 500-6585mevery 6 hours as needed iii. NOTE: You may take both of these medications together - most patients find it most helpful when alternating between the two (i.e. Ibuprofen at 6am, tylenol at 9am, ibuprofen at 12pm ...) b. A  prescription for pain medication may have been prescribed for you at discharge.  Take your pain medication as prescribed.  i. If you are having problems/concerns with the prescription medicine, please call us[place]or further advice.  4. Avoid getting constipated.  Between the surgery and the pain medications, it is common to experience some constipation.  Increasing fluid intake (64oz of water per day) and taking a fiber supplement (such as Metamucil, Citrucel, FiberCon) 1-2 times a day regularly will usually help prevent this problem from occurring.  Take Miralax (over the counter) 1-2x/day while taking a narcotic pain medication. If no bowel movement after 48hours, you may additionally take a laxative like a bottle of Milk of Magnesia which can be purchased over the counter. Avoid enemas if possible as these are often painful.   5. Watch out for diarrhea.  If you have many loose bowel movements, simplify your diet to bland foods.  Stop any stool softeners and decrease your fiber supplement. If this worsens or does  not improve, please call us[place] 6. Wash / shower every day.  If you were discharged with a dressing, you may remove this the day after your surgery. You may shower normally, getting soap/water on your wound, particularly after bowel movements.  7. Soaking in a warm bath filled a couple inches ("Sitz bath") is a great way to clean the area after a bowel movement and many patients find it is a way to soothe the area.  8. ACTIVITIES as tolerated:   a. You may resume regular (light) daily activities beginning the next day--such as daily self-care, walking, climbing stairs--gradually increasing activities as tolerated.  If you can walk 30 minutes without difficulty, it is safe to try more intense activity such as jogging, treadmill, bicycling, low-impact aerobics, etc. b. Refrain from any heavy lifting or straining for the first 2 weeks after your procedure, particularly if your surgery was for hemorrhoids. c. Avoid activities that make your pain worse d. You may drive when you are no longer taking prescription pain medication, you can comfortably wear a seatbelt, and you can safely maneuver your car and apply brakes.  9. FOLLOW UP in our office a. Please call [facility] at [phone] to set up an appointment to see your surgeon in the office for a follow-up appointment approximately 2 weeks after your surgery. b. Make sure that you call for this appointment the day you arrive home to insure a convenient appointment time.  9. If you have disability or family leave forms that need to be completed,  you may have them completed by your primary care physician's office; for return to work instructions, please ask our office staff and they will be happy to assist you in obtaining this documentation   When to call us [phone]: 1. Poor pain control 2. Reactions / problems with new medications (rash/itching, etc)  3. Fever over 101.5 F (38.5 C) 4. Inability to urinate 5. Nausea/vomiting 6. Worsening  swelling or bruising 7. Continued bleeding from incision. 8. Increased pain, redness, or drainage from the incision  The clinic staff is available to answer your questions during regular business hours (8:30am-5pm).  Please don't hesitate to call and ask to speak to one of our nurses for clinical concerns.   A surgeon from [facility] is always on call at the hospitals   If you have a medical emergency, go to the nearest emergency room or call 911.   [facility] [address], [address], [place], [place]  [address] ? MAIN: [phone] FAX [phone] [email or web address] 

## 2019-05-15 NOTE — Anesthesia Postprocedure Evaluation (Signed)
Anesthesia Post Note  Patient: Jonathan [name]  Procedure(s) Performed: ANORECTAL EXAM UNDER ANESTHESIA WITH EXCISION; HEMORRHOIDECTOMY, FULGURATION OF FLAT CONDYLOMA (N/A )     Patient location during evaluation: PACU Anesthesia Type: General Level of consciousness: awake and alert Pain management: pain level controlled Vital Signs Assessment: post-procedure vital signs reviewed and stable Respiratory status: spontaneous breathing, nonlabored ventilation, respiratory function stable and patient connected to nasal cannula oxygen Cardiovascular status: blood pressure returned to baseline and stable Postop Assessment: no apparent nausea or vomiting Anesthetic complications: no    Last Vitals:  Vitals:   05/15/19 1100 05/15/19 1115  BP: (!) 144/98 (!) 153/104  Pulse: 96 93  Resp: 16 (!) 25  Temp:  (!) 36.3 C  SpO2: 99% 96%    Last Pain:  Vitals:   05/15/19 1115  TempSrc:   PainSc: 0-No pain                 Kara Melching

## 2019-05-15 NOTE — Transfer of Care (Signed)
Immediate Anesthesia Transfer of Care Note  Patient: BREX HARDISTER  Procedure(s) Performed: ANORECTAL EXAM UNDER ANESTHESIA WITH EXCISION; HEMORRHOIDECTOMY, FULGURATION OF FLAT CONDYLOMA (N/A )  Patient Location: PACU  Anesthesia Type:General  Level of Consciousness: awake, alert , oriented and patient cooperative  Airway & Oxygen Therapy: Patient Spontanous Breathing and Patient connected to tracheostomy mask oxygen  Post-op Assessment: Report given to RN, Post -op Vital signs reviewed and stable and Patient moving all extremities  Post vital signs: Reviewed and stable  Last Vitals:  Vitals Value Taken Time  BP 119/87 05/15/19 1045  Temp    Pulse 89 05/15/19 1047  Resp 20 05/15/19 1047  SpO2 90 % 05/15/19 1047  Vitals shown include unvalidated device data.  Last Pain:  Vitals:   05/15/19 1043  TempSrc:   PainSc: (P) 0-No pain         Complications: No apparent anesthesia complications

## 2019-05-15 NOTE — Anesthesia Procedure Notes (Signed)
Procedure Name: Intubation Date/Time: 05/15/2019 9:50 AM Performed by: Mitzie Na, CRNA Pre-anesthesia Checklist: Patient identified, Emergency Drugs available, Suction available and Patient being monitored Patient Re-evaluated:Patient Re-evaluated prior to induction Oxygen Delivery Method: Circle system utilized Preoxygenation: Pre-oxygenation with 100% oxygen Induction Type: IV induction Tube type: Reinforced Tube size: 6.0 mm Number of attempts: 1 Placement Confirmation: positive ETCO2 and breath sounds checked- equal and bilateral Secured at: 13 cm Tube secured with: Tape Dental Injury: Teeth and Oropharynx as per pre-operative assessment

## 2019-05-15 NOTE — Op Note (Signed)
05/15/2019  10:26 AM  PATIENT:  Jonathan [name]  59 y.o. male  Patient Care Team: Velna Ochs, MD as PCP - General Eppie Gibson, MD as Attending Physician (Radiation Oncology) Karie Mainland, RD as Dietitian (Nutrition)  PRE-OPERATIVE DIAGNOSIS:  Anal canal HGSIL  POST-OPERATIVE DIAGNOSIS:  Same  PROCEDURE:   1. Left lateral hemorrhoidectomy (abnormal appearing tissue overlying, HGSIL) 2. Fulguration of anal canal condyloma 3. Anorectal exam under anesthesia  SURGEON:  Surgeon(s): Ileana Roup, MD  ANESTHESIA:   local and general  SPECIMEN:  Left lateral hemorrhoid column - evaluate for dysplasia  DISPOSITION OF SPECIMEN:  PATHOLOGY  COUNTS:  Sponge, needle, and instrument counts were reported correct x2 at conclusion.  EBL: 5 mL  Drains: None  PLAN OF CARE: Discharge to home after PACU  PATIENT DISPOSITION:  PACU - hemodynamically stable.  INDICATION: 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 Hilarie Fredrickson for personal history of colon polyps.  He was found to have a distal rectal/anal canal polypoid lesion that was biopsied and returned HGSIL.  He was referred to me for further evaluation and management.  He had no significant symptoms related to this.  He denies any known history of HPV.  He denies history of warts elsewhere in his body.  He was seen and examined and found to have a polypoid lesion in the anal canal left lateral/posterior lateral.  No other concerning findings were identified in the office.  We discussed options moving forward and I recommended excision of this lesion.  He opted to pursue surgery.  Please refer to notes elsewhere for details regarding this discussion.  OR FINDINGS: No palpable masses.  Circumferential anoscopy demonstrates left lateral/posterior lateral abnormal appearing tissue consistent with  dysplasia as found on biopsy overlying a left hemorrhoid.  Therefore, a left hemorrhoidectomy was carried out and all abnormal appearing tissue removed.  The margins of this were then treated with fulguration.  There was also 2 small 2 mm flat appearing lesions in the right posterior lateral anal canal that were treated with fulguration.  A piece of Surgifoam coated and dibucaine was placed in the anal canal at conclusion.  DESCRIPTION: The patient was identified in the preoperative holding area and taken to the OR where he was placed on the operating room table. SCDs were placed.  General endotracheal anesthesia via his tracheal stoma was induced without difficulty. The patient was then positioned in rolled onto the operating table in the prone position.  The buttocks were coated with benzoin and gently taped apart. He was then prepped and draped in usual sterile fashion.  A surgical timeout was performed indicating the correct patient, procedure, positioning.  A perianal block was performed using dilute mixture of Exparel with 0.25% Marcaine.  A well lubricated digital rectal exam was performed which demonstrated no palpable masses.  A Hill-Ferguson anoscope was into the anal canal and circumferential inspection demonstrated area of concern in the left lateral/posterior lateral anal canal overlying a left lateral hemorrhoid bundle.  The tissue had changes overlying it consistent with biopsy-proven dysplasia.  Circumferential evaluation also demonstrates a area with 2 small foci approximately 2 mm in size of flat appearing condyloma in the right posterior lateral anal canal.  These were treated with application of electrocautery for fulguration.   Attention was then turned to the left lateral hemorrhoid bundle with the abnormal appearing tissue.  This was  elevated with a DeBakey forcep.  Areas of planned excision were marked with cautery.  This was then excised fully including all abnormal appearing tissue  with the specimen.  The apex of the hemorrhoid was first ligated with a 2-0 Vicryl figure-of-eight suture.  There is no active bleeding after this maneuver.  The specimen was then passed off. The edges of the excision were then treated with fulguration to ensure no abnormal cells remain.  The remaining anal tissue in the left lateral position was then approximated using a 3-0 Vicryl running locking suture.  The anal canal was reinspected and no additional or concerning findings were identified.  Additional local anesthetic was infiltrated.  The anal canal was then irrigated and hemostasis verified.  A piece of Surgifoam coated and dibucaine ointment was then placed in the anal canal.  The tape was then released.  A dressing consisting of 4 x 4 gauze, ABD, and mesh underwear was placed.  He was then taken out of the prone position, awakened from anesthesia, extubated, and transferred to recovery in satisfactory condition.  DISPOSITION: PACU in satisfactory condition.

## 2019-05-15 NOTE — Interval H&P Note (Signed)
History and Physical Interval Note:  05/15/2019 9:31 AM  Jonathan [name]  has presented today for surgery, with the diagnosis of ANAL POLYP-HIGH GRADE SQUAMOUS INTRAEPITHELIAL NEOPLASM.  The various methods of treatment have been discussed with the patient and family. After consideration of risks, benefits and other options for treatment, the patient has consented to  Procedure(s): ANORECTAL EXAM UNDER ANESTHESIA WITH EXCISION OF ANAL CANAL POLYP (N/A) as a surgical intervention.  The patient's history has been reviewed, patient examined, no change in status, stable for surgery.  I have reviewed the patient's chart and labs.  Questions were answered to the patient's satisfaction.    Nadeen Landau, M.D. [facility]

## 2019-05-16 DIAGNOSIS — Z430 Encounter for attention to tracheostomy: Secondary | ICD-10-CM | POA: Diagnosis not present

## 2019-05-16 LAB — SURGICAL PATHOLOGY

## 2019-05-29 DIAGNOSIS — Z86010 Personal history of colonic polyps: Secondary | ICD-10-CM | POA: Diagnosis not present

## 2019-05-29 DIAGNOSIS — D62 Acute posthemorrhagic anemia: Secondary | ICD-10-CM | POA: Diagnosis not present

## 2019-05-29 DIAGNOSIS — R079 Chest pain, unspecified: Secondary | ICD-10-CM | POA: Diagnosis not present

## 2019-05-29 DIAGNOSIS — Z9889 Other specified postprocedural states: Secondary | ICD-10-CM | POA: Diagnosis not present

## 2019-05-29 DIAGNOSIS — K922 Gastrointestinal hemorrhage, unspecified: Secondary | ICD-10-CM | POA: Diagnosis not present

## 2019-05-29 DIAGNOSIS — K625 Hemorrhage of anus and rectum: Secondary | ICD-10-CM | POA: Diagnosis not present

## 2019-05-29 DIAGNOSIS — Z20822 Contact with and (suspected) exposure to covid-19: Secondary | ICD-10-CM | POA: Diagnosis not present

## 2019-05-30 ENCOUNTER — Telehealth: Payer: Self-pay | Admitting: Internal Medicine

## 2019-05-30 DIAGNOSIS — K625 Hemorrhage of anus and rectum: Secondary | ICD-10-CM | POA: Diagnosis not present

## 2019-05-30 DIAGNOSIS — D62 Acute posthemorrhagic anemia: Secondary | ICD-10-CM | POA: Diagnosis not present

## 2019-05-30 DIAGNOSIS — K921 Melena: Secondary | ICD-10-CM | POA: Diagnosis not present

## 2019-05-30 DIAGNOSIS — K922 Gastrointestinal hemorrhage, unspecified: Secondary | ICD-10-CM | POA: Diagnosis not present

## 2019-05-30 DIAGNOSIS — K626 Ulcer of anus and rectum: Secondary | ICD-10-CM | POA: Diagnosis not present

## 2019-05-30 DIAGNOSIS — T185XXA Foreign body in anus and rectum, initial encounter: Secondary | ICD-10-CM | POA: Diagnosis not present

## 2019-05-30 DIAGNOSIS — K644 Residual hemorrhoidal skin tags: Secondary | ICD-10-CM | POA: Diagnosis not present

## 2019-05-30 DIAGNOSIS — R079 Chest pain, unspecified: Secondary | ICD-10-CM | POA: Diagnosis not present

## 2019-05-30 NOTE — Telephone Encounter (Signed)
PER [place] F/U FOR LOWER GI BLEED; PT APPT 03/04 1045AM

## 2019-05-31 DIAGNOSIS — D62 Acute posthemorrhagic anemia: Secondary | ICD-10-CM | POA: Diagnosis not present

## 2019-05-31 DIAGNOSIS — R079 Chest pain, unspecified: Secondary | ICD-10-CM | POA: Diagnosis not present

## 2019-05-31 DIAGNOSIS — K625 Hemorrhage of anus and rectum: Secondary | ICD-10-CM | POA: Diagnosis not present

## 2019-05-31 DIAGNOSIS — K922 Gastrointestinal hemorrhage, unspecified: Secondary | ICD-10-CM | POA: Diagnosis not present

## 2019-06-01 DIAGNOSIS — K922 Gastrointestinal hemorrhage, unspecified: Secondary | ICD-10-CM | POA: Diagnosis not present

## 2019-06-01 DIAGNOSIS — R079 Chest pain, unspecified: Secondary | ICD-10-CM | POA: Diagnosis not present

## 2019-06-01 DIAGNOSIS — D62 Acute posthemorrhagic anemia: Secondary | ICD-10-CM | POA: Diagnosis not present

## 2019-06-01 DIAGNOSIS — K625 Hemorrhage of anus and rectum: Secondary | ICD-10-CM | POA: Diagnosis not present

## 2019-06-02 MED ORDER — LIDOCAINE HCL 1 % IJ SOLN
0.50 | INTRAMUSCULAR | Status: DC
Start: ? — End: 2019-06-02

## 2019-06-02 MED ORDER — MELATONIN 3 MG PO TABS
3.00 | ORAL_TABLET | ORAL | Status: DC
Start: ? — End: 2019-06-02

## 2019-06-02 MED ORDER — ACETAMINOPHEN 325 MG PO TABS
650.00 | ORAL_TABLET | ORAL | Status: DC
Start: ? — End: 2019-06-02

## 2019-06-02 MED ORDER — GENERIC EXTERNAL MEDICATION
Status: DC
Start: ? — End: 2019-06-02

## 2019-06-02 MED ORDER — LEVOTHYROXINE SODIUM 75 MCG PO TABS
150.00 | ORAL_TABLET | ORAL | Status: DC
Start: 2019-06-02 — End: 2019-06-02

## 2019-06-02 MED ORDER — DSS 100 MG PO CAPS
100.00 | ORAL_CAPSULE | ORAL | Status: DC
Start: 2019-06-01 — End: 2019-06-02

## 2019-06-02 MED ORDER — PANTOPRAZOLE SODIUM 40 MG PO TBEC
40.00 | DELAYED_RELEASE_TABLET | ORAL | Status: DC
Start: 2019-06-02 — End: 2019-06-02

## 2019-06-02 MED ORDER — POLYETHYLENE GLYCOL 3350 17 GM/SCOOP PO POWD
17.00 | ORAL | Status: DC
Start: 2019-06-02 — End: 2019-06-02

## 2019-06-03 NOTE — Telephone Encounter (Signed)
Called pt for HFU call, since he was at [place], did not go into depth about care. He states he has everything he needs, he was pleased with his care at [facility], has his medicines. He informs nurse before she ask that he has f/u appt 3/4 and will be to clinic that day. Again he states he is doing well, needs are met. He is encouraged to call for any assistance that may arise and he is agreeable.

## 2019-06-05 ENCOUNTER — Ambulatory Visit: Payer: Medicaid Other | Admitting: Internal Medicine

## 2019-06-05 ENCOUNTER — Encounter: Payer: Self-pay | Admitting: Internal Medicine

## 2019-06-05 ENCOUNTER — Other Ambulatory Visit: Payer: Self-pay

## 2019-06-05 VITALS — BP 143/94 | HR 88 | Temp 98.4°F | Ht 65.0 in | Wt 134.7 lb

## 2019-06-05 DIAGNOSIS — D649 Anemia, unspecified: Secondary | ICD-10-CM

## 2019-06-05 DIAGNOSIS — Z79890 Hormone replacement therapy (postmenopausal): Secondary | ICD-10-CM | POA: Diagnosis not present

## 2019-06-05 DIAGNOSIS — Z79899 Other long term (current) drug therapy: Secondary | ICD-10-CM | POA: Diagnosis not present

## 2019-06-05 DIAGNOSIS — Z8719 Personal history of other diseases of the digestive system: Secondary | ICD-10-CM | POA: Diagnosis not present

## 2019-06-05 DIAGNOSIS — Z923 Personal history of irradiation: Secondary | ICD-10-CM | POA: Diagnosis not present

## 2019-06-05 DIAGNOSIS — K625 Hemorrhage of anus and rectum: Secondary | ICD-10-CM | POA: Insufficient documentation

## 2019-06-05 DIAGNOSIS — Z8521 Personal history of malignant neoplasm of larynx: Secondary | ICD-10-CM | POA: Diagnosis not present

## 2019-06-05 DIAGNOSIS — E039 Hypothyroidism, unspecified: Secondary | ICD-10-CM

## 2019-06-05 DIAGNOSIS — Z87891 Personal history of nicotine dependence: Secondary | ICD-10-CM

## 2019-06-05 DIAGNOSIS — Z9221 Personal history of antineoplastic chemotherapy: Secondary | ICD-10-CM

## 2019-06-05 DIAGNOSIS — I10 Essential (primary) hypertension: Secondary | ICD-10-CM

## 2019-06-05 DIAGNOSIS — Z86010 Personal history of colonic polyps: Secondary | ICD-10-CM

## 2019-06-05 MED ORDER — LEVOTHYROXINE SODIUM 125 MCG PO TABS: 125.0000 ug | ORAL_TABLET | Freq: Every day | ORAL | 5 refills | Status: DC

## 2019-06-05 NOTE — Assessment & Plan Note (Signed)
Blood pressure 143/94 today.  Hypertension is asymptomatic.  Amlodipine was recently stopped due to anemia secondary to rectal bleeding.  Plan: *Restart amlodipine 5 mg daily

## 2019-06-05 NOTE — Assessment & Plan Note (Addendum)
During hospitalization at [facility], TSH of 0.02 and T4 1.45 (05/30/2019).  Synthroid was decreased to 125 mcg daily at discharge.  Plan: * Continue synthroid 125 mcg daily * Follow-up in 6-8 weeks for repeat TSH given recent dose change of synthroid

## 2019-06-05 NOTE — Patient Instructions (Addendum)
You were seen in the clinic for follow-up from your hospitalization.  Here are my recommendations:  1. Restart your amlodipine 5 mg daily 2. Restart your omeprazole 20 mg daily 3. Continue taking your levothyroxine 125 mcg daily 4. Take miralax as needed to soften your stools. You can increase the number of times you take this if once a day is not enough 5. We will check your blood counts today. I will call you with the results  Thank you for allowing [place] to be part of your medical care!

## 2019-06-05 NOTE — Assessment & Plan Note (Addendum)
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  He had noted to 3 days of large-volume rectal bleeding.    Flexible sigmoidoscopy was conducted during admission showing large clot in rectum, perianal skin tags.  Suture material was present in the anal canal.  Fresh blood was present in the rectum, in the rectosigmoid canal, In the sigmoid colon, and in the descending colon.  A single clean-based ulcer in the anal canal near the suture was present. Patient received total 3 units PRBCs during hospitalization.  Bleeding stopped and patient had normal bowel movement before discharge.  It was thought that patient likely bled from the polypectomy/hemorrhoidectomy site 2/2 to constipation. They advised for high-fiber diet and stool softeners to avoid constipation.  Hemoglobin was 7.9 on day of discharge.  SUBJECTIVE: Patient reports that he had fatigue, lightheadedness prior to hospitalization.  Patient denies any recurrence of the symptoms since discharge.  Patient reports continued normal, loose bowel movements, reports taking stool softeners.  Patient denies chest pain, shortness of breath, stomach pain, nausea, or vomiting.  Patient reports that he was instructed to stop his amlodipine, stop his omeprazole, and decrease his Synthroid to 125 mcg  ASSESSMENT/PLAN: Patient without signs of recurrent rectal bleeding.  Hemoglobin was 7.9 on day of discharge (06/01/19), and patient has baseline hemoglobin of 14.5 (05/08/19).  *Recheck CBC to ensure hemoglobin is stable *Continue MiraLAX as needed for soft stools to prevent recurrence *Restart amlodipine 5 mg *Restart omeprazole *Continue Synthroid 125 mcg

## 2019-06-05 NOTE — Progress Notes (Signed)
   CC: Follow-up after hospitalization for rectal bleeding  HPI: Patient is a 59 year old male with past medical history as below who presents for follow-up for recent hospitalization at [facility] for rectal bleeding.  [name] is a 59 y.o.   Past Medical History:  Diagnosis Date  . Allergic rhinitis   . Arthritis    Bilateral knee arthritis for about 20 years. Not taking any medications, believes in nature processes  . Colon polyp   . Dental caries    prechemoradiation therapy, periodontitis  . Elevated blood pressure reading    White Coat Hypertension  . GERD (gastroesophageal reflux disease)   . Headache   . History of radiation therapy 09/11/16- 10/30/16   Larynx 70 Gy in 35 fractions  . HLD (hyperlipidemia)   . Hypothyroidism    radiation induced hypothyroidism  . Internal hemorrhoids   . Laryngeal cancer ([place])   . Pneumonia    as a child  . Tubular adenoma of colon    Review of Systems:   Review of Systems  Respiratory: Negative for shortness of breath.   Cardiovascular: Negative for chest pain.  Gastrointestinal: Negative for abdominal pain, nausea and vomiting.  All other systems reviewed and are negative.   Physical Exam:  Vitals:   06/05/19 1024  BP: (!) 143/94  Pulse: 88  Temp: 98.4 F (36.9 C)  TempSrc: Oral  SpO2: 100%  Weight: 134 lb 11.2 oz (61.1 kg)  Height: 5\' 5"  (1.651 m)   Physical Exam  Constitutional: He is well-developed, well-nourished, and in no distress.  HENT:  Head: Normocephalic and atraumatic.  Eyes: EOM are normal. Right eye exhibits no discharge. Left eye exhibits no discharge.  Neck: No tracheal deviation present.  Cardiovascular: Normal rate and regular rhythm. Exam reveals no gallop and no friction rub.  No murmur heard. Pulmonary/Chest: Effort normal and breath sounds normal. No respiratory distress. He has no wheezes. He has no rales.  Abdominal: Soft. He exhibits no distension. There is no abdominal tenderness. There is  no rebound and no guarding.  Musculoskeletal:        General: No tenderness, deformity or edema. Normal range of motion.     Cervical back: Normal range of motion.  Neurological: He is alert. Coordination normal.  Skin: Skin is warm and dry. No rash noted. He is not diaphoretic. No erythema.  Psychiatric: Memory and judgment normal.     Assessment & Plan:   See Encounters Tab for problem based charting.  Patient discussed with Dr. Dareen Piano

## 2019-06-06 LAB — CBC
Hematocrit: 28.1 % — ABNORMAL LOW (ref 37.5–51.0)
Hemoglobin: 9.6 g/dL — ABNORMAL LOW (ref 13.0–17.7)
MCH: 32.2 pg (ref 26.6–33.0)
MCHC: 34.2 g/dL (ref 31.5–35.7)
MCV: 94 fL (ref 79–97)
Platelets: 394 10*3/uL (ref 150–450)
RBC: 2.98 x10E6/uL — ABNORMAL LOW (ref 4.14–5.80)
RDW: 14 % (ref 11.6–15.4)
WBC: 6.5 10*3/uL (ref 3.4–10.8)

## 2019-06-07 DIAGNOSIS — Z23 Encounter for immunization: Secondary | ICD-10-CM | POA: Diagnosis not present

## 2019-06-09 NOTE — Progress Notes (Signed)
Internal Medicine Clinic Attending  Case discussed with Dr. MacLean at the time of the visit.  We reviewed the resident's history and exam and pertinent patient test results.  I agree with the assessment, diagnosis, and plan of care documented in the resident's note.    

## 2019-07-22 DIAGNOSIS — Z430 Encounter for attention to tracheostomy: Secondary | ICD-10-CM | POA: Diagnosis not present

## 2019-07-30 ENCOUNTER — Encounter: Payer: Medicaid Other | Admitting: Internal Medicine

## 2019-08-05 DIAGNOSIS — Z430 Encounter for attention to tracheostomy: Secondary | ICD-10-CM | POA: Diagnosis not present

## 2019-09-02 ENCOUNTER — Encounter: Payer: Self-pay | Admitting: Hematology and Oncology

## 2019-09-02 ENCOUNTER — Telehealth: Payer: Self-pay | Admitting: Hematology and Oncology

## 2019-09-02 ENCOUNTER — Other Ambulatory Visit: Payer: Self-pay

## 2019-09-02 ENCOUNTER — Inpatient Hospital Stay: Payer: Medicaid Other | Attending: Hematology and Oncology | Admitting: Hematology and Oncology

## 2019-09-02 DIAGNOSIS — E039 Hypothyroidism, unspecified: Secondary | ICD-10-CM | POA: Insufficient documentation

## 2019-09-02 DIAGNOSIS — Z8521 Personal history of malignant neoplasm of larynx: Secondary | ICD-10-CM | POA: Insufficient documentation

## 2019-09-02 DIAGNOSIS — I10 Essential (primary) hypertension: Secondary | ICD-10-CM | POA: Diagnosis not present

## 2019-09-02 DIAGNOSIS — C321 Malignant neoplasm of supraglottis: Secondary | ICD-10-CM

## 2019-09-02 NOTE — Telephone Encounter (Signed)
Scheduled appts per 6/1 sch msg. Left voicemail with appt date and time.

## 2019-09-02 NOTE — Progress Notes (Signed)
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  HTN (hypertension) His blood pressure is elevated but could be due to anxiety I will monitor closely He will continue his antihypertensives and follow-up with primary care doctor for medical management   No orders of the defined types were placed in this encounter.   All questions were answered. The patient knows to call the clinic with any problems, questions or concerns. The total time spent in the appointment was 20 minutes encounter with patients including review of chart and various tests results, discussions about plan of care and coordination of care plan   Heath Lark, MD 09/02/2019 11:16 AM  INTERVAL HISTORY: Please see below for problem oriented charting. He returns with his wife for further follow-up They are both concerned about his reduced sex drive He denies smoking or drinking No oral lesions Denies swallowing difficulties He continues close follow-up with ENT every 3 to 6 months for tracheostomy care He is taking his medications as prescribed No recent infection, fever or chills He has reduced stamina when he tries to stay active/exercise  SUMMARY OF ONCOLOGIC HISTORY: Oncology History  Malignant neoplasm of supraglottis ([place])  04/17/2016 Imaging   Ct neck showed relatively large approximately 3.5 cm laryngeal mass most compatible with  laryngeal carcinoma. Epicenter at the glottis on the right. Extension across midline as well as involvement of laryngeal cartilages (possibly also bilateral). 2. Relatively small but malignant appearing right level 2 lymphadenopathy. Suspicious contralateral left level 2 lymph nodes.   04/21/2016 Initial Diagnosis   He saw Dr. Constance Holster in the clinic for chronic hoarseness. In the office, it was noted that the posterior soft palate, uvula, tongue base and vallecula were visualized and appeared healthy without mucosal masses or lesions. There is a large mass involving the right side of the larynx, the right cord is fixed. There may be involvement of the post cricoid area it is difficult to say for sure. 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 Clinically this is at least staged as T3. The airway is significantly impaired. He is at risk for impending airway obstruction. ENT recommended him for awake tracheostomy with direct laryngoscopy and biopsy and esophagoscopy under anesthesia immediately following that.    04/24/2016 Pathology Results   Larynx, biopsy, Right Side - POSITIVE FOR INVASIVE SQUAMOUS CELL CARCINOMA. - SEE COMMENT. Microscopic Comment As sampled, the squamous cell carcinoma appears well differentiated. A fragment of benign cartilage is also present in the specimen.   04/24/2016 Surgery   She underwent direct laryngoscopy with biopsy. An anterior commissure scope was used to evaluate the larynx. A large mass and, using the right side of the larynx and the anterior commissure area was identified. Multiple biopsies were taken. The left cord was swollen but seemed to be uninvolved. Piriforms appeared to be clear. The post cricoid area appeared clear. Esophagoscopy was difficult and ENT surgeon was unable to pass the scope  into the esophagus. He had tracheostomy   04/24/2016 Pathology Results   Larynx, biopsy, Right Side - POSITIVE FOR INVASIVE  SQUAMOUS CELL CARCINOMA. - SEE COMMENT. Microscopic Comment As sampled, the squamous cell carcinoma appears well differentiated. A fragment of benign cartilage is also present in the specimen.    05/15/2016 PET scan   Right eccentric glottic mass, appears to cross the anterior commissure with destructive findings of the thyroid cartilage. There is an upper normal size right station 2A lymph node with maximum SUV of 4.2 which is only very faintly elevated over background, but which could possibly represent a small amount of local metastatic spread. 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 2. Asymmetric calcification along the head of the right caudate nucleus and adjacent lentiform nucleus. Well this can sometimes be physiologic, there is some accompanying reduction in PET activity, and the asymmetry is an unusual feature. Accordingly I recommend MRI of the brain with and without contrast for further characterization. 3. A AP window lymph node is minimally enlarged at 1.1 cm, but with a maximum SUV below background mediastinal levels, likely benign.   05/23/2016 Imaging   MRI brain: Atrophy with chronic microvascular ischemic change. No intracranial metastatic disease. 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    05/26/2016 Surgery   Multiple extraction of tooth numbers 2, 5, 6, 8-13, and  21-28. 2. 4 Quadrants of alveoloplasty. 3. Bilateral mandibular lingual tori reductions 4. Maxillary right, maxillary left, and mandibular left buccal exostoses reductions   06/02/2016 Procedure   Successful placement of a right internal jugular approach power injectable Port-A-Cath. The catheter is ready for immediate use   06/09/2016 - 08/22/2016 Chemotherapy   He received induction chemotherapy  with taxotere, cisplatin and 5 FU x 4 cycles   07/31/2016 PET scan   Mild asymmetric soft tissue prominence along the right supraglottic larynx, without hypermetabolism, likely corresponding to treated tumor. No findings suspicious for metastatic disease. Small mediastinal and left axillary nodes, without associated hypermetabolism.   09/11/2016 - 10/30/2016 Radiation Therapy   He received radiation Radiation treatment dates:   09/11/16-10/30/16  Site/dose:   Larynx and b/l neck, 70 Gy in 35 fractions  Beams/energy:   IMRT, 6X    01/01/2017 Imaging   CT neck: Asymmetric soft tissue prominence in the right supraglottic region extending down to the glottis on the right. This appears similar to the previous PET scan at which time there was not any hypermetabolism in the region. Therefore, this could be chronic post treatment change. However, there does appear to be some enhancing tissue and I cannot exclude the possibility of residual or recurrent tumor on the basis of this exam. There is no evidence of any nodal disease.   05/04/2017 Procedure   Trachestomy was removed   06/19/2017 Imaging   MRI brain 1. Abnormal enhancement versus intrinsic density RIGHT basal ganglia, subacute infarct or metastasis possible. 2. More conspicuous 8 x 10 mm nodular enhancement RIGHT supraglottic fat at site of primary tumor associated with vocal cord paralysis. Recurrent tumor suspected. Recommend direct inspection. 3. Post radiation changes of the neck including sialoadenitis. Worsening supraglottic edema. 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  4. Interval tracheostomy tube removal.   06/21/2017 Imaging   MRI brain 1. No evidence of intracranial metastases or acute abnormality. 2.  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 Basal ganglia mineralization accounts for the density on CT.   06/22/2017 PET scan   1. There is  continued soft tissue prominence along the right glottic and supraglottic region but without significant accentuated hypermetabolic activity to further suggest recurrence. No hypermetabolic adenopathy or other lesion to suggest active malignancy at this time. 2. Stable appearance of hyperdensity along the right basal ganglia attributed to underlying developmental venous anomaly based on prior MRI workup.  3. Other imaging findings of potential clinical significance: Aortic Atherosclerosis (ICD10-I70.0). Coronary atherosclerosis.   07/06/2017 Surgery   He had direct laryngoscopy and biopsy   07/11/2017 Pathology Results   Biopsy confirmed invasive squamous cell carcinoma   08/10/2017 Surgery   PROCEDURE:  Procedure(s): Dr. Constance Holster performed TOTAL LARYNGECTOMY     08/10/2017 Pathology Results   1. Pharynx, biopsy, right margin - BENIGN FIBROADIPOSE TISSUE. - THERE IS NO EVIDENCE OF MALIGNANCY. 2. Larynx, laryngectomy, Total - INVASIVE SQUAMOUS CELL CARCINOMA, WELL DIFFERENTIATED, HORIZONTALLY SPANNING 1.0 CM. - CARCINOMA INVADES 0.1 CM. - THE SURGICAL RESECTIONS ARE NEGATIVE FOR CARCINOMA. - SEE ONCOLOGY TABLE BELOW. Microscopic Comment 2. LARYNX (SUPRAGLOTTIS, GLOTTIS, SUBGLOTTIS): Procedure: Laryngectomy. Tumor Site: Right true cord with extension to midline. Tumor Laterality: Right. Tumor Focality: Unifocal. Tumor Size: 1.0 cm horizontally (gross measurement), invades 0.1 cm. Histologic Type: Squamous cell carcinoma. Histologic Grade: Well differentiated. Margins: Negative, closest margin is right lateral mucosal margin at 3.0 cm (gross measurement). Lymphovascular Invasion: Not identified. Perineural Invasion: Not identified. Regional Lymph Nodes: None examined. Pathologic Stage Classification (pTNM, AJCC 8th Edition): pT1a, pNX. Ancillary Studies: Can be performed upon clinician request. Representative Tumor Block: 2H.   04/16/2018 Procedure   Removal of implanted Port-A-Cath  utilizing sharp and blunt dissection. The procedure was uncomplicated     REVIEW OF SYSTEMS:   Constitutional: Denies fevers, chills or abnormal weight loss Eyes: Denies blurriness of vision Ears, nose, mouth, throat, and face: Denies mucositis or sore throat Respiratory: Denies cough, dyspnea or wheezes Cardiovascular: Denies palpitation, chest discomfort or lower extremity swelling Gastrointestinal:  Denies nausea, heartburn or change in bowel habits Skin: Denies abnormal skin rashes Lymphatics: Denies new lymphadenopathy or easy bruising Neurological:Denies numbness, tingling or new weaknesses Behavioral/Psych: Mood is stable, no new changes  All other systems were reviewed with the patient and are negative.  I have reviewed the past medical history, past surgical history, social history and family history with the patient and they are unchanged from previous note.  ALLERGIES:  has No Known Allergies.  MEDICATIONS:  Current Outpatient Medications  Medication Sig Dispense Refill  . acetaminophen (TYLENOL) 500 MG tablet Take 1,000 mg by mouth every 6 (six) hours as needed for moderate pain or headache.    Marland Kitchen amLODipine (NORVASC) 5 MG tablet TAKE 1 TABLET BY MOUTH ONCE DAILY (Patient taking differently: Take 5 mg by mouth daily. ) 30 tablet 2  . Chlorpheniramine-PSE-Ibuprofen (ADVIL ALLERGY SINUS) 2-30-200 MG TABS Take 1 tablet by mouth daily as needed (allergies).    Marland Kitchen ibuprofen (ADVIL) 200 MG tablet Take 400 mg by mouth every 6 (six) hours as needed for headache or moderate pain.    Marland Kitchen levothyroxine (SYNTHROID) 125 MCG tablet Take 1 tablet (125 mcg total) by mouth daily before breakfast. 30 tablet 5  . omeprazole (PRILOSEC) 20 MG capsule Take 20 mg by mouth daily.    . Wheat Dextrin (BENEFIBER) POWD Take 1 Dose by mouth daily.     No current facility-administered  medications for this visit.    PHYSICAL EXAMINATION: ECOG PERFORMANCE STATUS: 1 - Symptomatic but completely  ambulatory  Vitals:   09/02/19 0956  BP: (!) 150/94  Pulse: 97  Resp: 18  Temp: 98.4 F (36.9 C)  SpO2: 100%   Filed Weights   09/02/19 0956  Weight: 141 lb (64 kg)    GENERAL:alert, no distress and comfortable SKIN: skin color, texture, turgor are normal, no rashes or significant lesions EYES: normal, Conjunctiva are pink and non-injected, sclera clear OROPHARYNX:no exudate, no erythema and lips, buccal mucosa, and tongue normal  NECK: Tracheostomy in situ.  No palpable abnormalities LYMPH:  no palpable lymphadenopathy in the cervical, axillary or inguinal LUNGS: clear to auscultation and percussion with normal breathing effort HEART: regular rate & rhythm and no murmurs and no lower extremity edema ABDOMEN:abdomen soft, non-tender and normal bowel sounds Musculoskeletal:no cyanosis of digits and no clubbing  NEURO: alert & oriented x 3 with fluent speech, no focal motor/sensory deficits  LABORATORY DATA:  I have reviewed the data as listed    Component Value Date/Time   NA 138 05/08/2019 1018   NA 141 01/04/2017 1432   K 3.6 05/08/2019 1018   K 4.1 01/04/2017 1432   CL 101 05/08/2019 1018   CO2 26 05/08/2019 1018   CO2 27 01/04/2017 1432   GLUCOSE 107 (H) 05/08/2019 1018   GLUCOSE 93 01/04/2017 1432   BUN 12 05/08/2019 1018   BUN 12.2 01/04/2017 1432   CREATININE 0.84 05/08/2019 1018   CREATININE 0.8 01/04/2017 1432   CALCIUM 9.4 05/08/2019 1018   CALCIUM 9.8 01/04/2017 1432   PROT 7.7 05/08/2019 1018   PROT 6.9 01/04/2017 1432   ALBUMIN 4.5 05/08/2019 1018   ALBUMIN 3.8 01/04/2017 1432   AST 18 05/08/2019 1018   AST 18 01/04/2017 1432   ALT 15 05/08/2019 1018   ALT 12 01/04/2017 1432   ALKPHOS 58 05/08/2019 1018   ALKPHOS 67 01/04/2017 1432   BILITOT 0.5 05/08/2019 1018   BILITOT 0.23 01/04/2017 1432   GFRNONAA >60 05/08/2019 1018   GFRNONAA 82 03/06/2011 0853   GFRAA >60 05/08/2019 1018   GFRAA >89 03/06/2011 0853    No results found for: SPEP,  UPEP  Lab Results  Component Value Date   WBC 6.5 06/05/2019   NEUTROABS 5.9 05/08/2019   HGB 9.6 (L) 06/05/2019   HCT 28.1 (L) 06/05/2019   MCV 94 06/05/2019   PLT 394 06/05/2019      Chemistry      Component Value Date/Time   NA 138 05/08/2019 1018   NA 141 01/04/2017 1432   K 3.6 05/08/2019 1018   K 4.1 01/04/2017 1432   CL 101 05/08/2019 1018   CO2 26 05/08/2019 1018   CO2 27 01/04/2017 1432   BUN 12 05/08/2019 1018   BUN 12.2 01/04/2017 1432   CREATININE 0.84 05/08/2019 1018   CREATININE 0.8 01/04/2017 1432      Component Value Date/Time   CALCIUM 9.4 05/08/2019 1018   CALCIUM 9.8 01/04/2017 1432   ALKPHOS 58 05/08/2019 1018   ALKPHOS 67 01/04/2017 1432   AST 18 05/08/2019 1018   AST 18 01/04/2017 1432   ALT 15 05/08/2019 1018   ALT 12 01/04/2017 1432   BILITOT 0.5 05/08/2019 1018   BILITOT 0.23 01/04/2017 1432

## 2019-09-02 NOTE — Assessment & Plan Note (Signed)
Clinically, he has no signs of cancer recurrence The patient has quit smoking permanently He will continue follow-up with ENT I will see him back in 1 year for further follow-up

## 2019-09-02 NOTE — Assessment & Plan Note (Signed)
His blood pressure is elevated but could be due to anxiety I will monitor closely He will continue his antihypertensives and follow-up with primary care doctor for medical management

## 2019-09-02 NOTE — Assessment & Plan Note (Signed)
He has acquired hypothyroidism status post laryngectomy and radiation He will continue primary care follow-up and have his TSH monitor and medication adjusted.

## 2019-09-03 ENCOUNTER — Ambulatory Visit: Payer: Medicaid Other | Admitting: Internal Medicine

## 2019-09-17 ENCOUNTER — Encounter: Payer: Medicaid Other | Admitting: Internal Medicine

## 2019-10-29 ENCOUNTER — Other Ambulatory Visit: Payer: Self-pay | Admitting: Internal Medicine

## 2019-10-29 DIAGNOSIS — I10 Essential (primary) hypertension: Secondary | ICD-10-CM

## 2019-11-12 ENCOUNTER — Encounter: Payer: Self-pay | Admitting: Internal Medicine

## 2019-11-12 ENCOUNTER — Ambulatory Visit (INDEPENDENT_AMBULATORY_CARE_PROVIDER_SITE_OTHER): Payer: Medicaid Other | Admitting: Internal Medicine

## 2019-11-12 ENCOUNTER — Other Ambulatory Visit: Payer: Self-pay

## 2019-11-12 VITALS — BP 185/106 | HR 76 | Temp 98.2°F | Ht 65.0 in | Wt 136.4 lb

## 2019-11-12 DIAGNOSIS — M25562 Pain in left knee: Secondary | ICD-10-CM | POA: Diagnosis not present

## 2019-11-12 DIAGNOSIS — M25561 Pain in right knee: Secondary | ICD-10-CM

## 2019-11-12 DIAGNOSIS — G8929 Other chronic pain: Secondary | ICD-10-CM | POA: Diagnosis not present

## 2019-11-12 DIAGNOSIS — I10 Essential (primary) hypertension: Secondary | ICD-10-CM

## 2019-11-12 DIAGNOSIS — E032 Hypothyroidism due to medicaments and other exogenous substances: Secondary | ICD-10-CM | POA: Diagnosis not present

## 2019-11-12 DIAGNOSIS — R519 Headache, unspecified: Secondary | ICD-10-CM

## 2019-11-12 DIAGNOSIS — M542 Cervicalgia: Secondary | ICD-10-CM

## 2019-11-12 DIAGNOSIS — C321 Malignant neoplasm of supraglottis: Secondary | ICD-10-CM

## 2019-11-12 MED ORDER — AMLODIPINE BESYLATE 10 MG PO TABS: 10.0000 mg | ORAL_TABLET | Freq: Every day | ORAL | 2 refills | Status: DC

## 2019-11-12 NOTE — Progress Notes (Signed)
Subjective:   Patient ID: Jonathan [name] male   DOB: Dec 23, 1960 59 y.o.   MRN: [id number]  HPI: Jonathan [name] is a 59 y.o. male with past medical history outlined below here for hypothyroidism follow up. For the details of today's visit, please refer to the assessment and plan.   Past Medical History:  Diagnosis Date  . Allergic rhinitis   . Arthritis    Bilateral knee arthritis for about 20 years. Not taking any medications, believes in nature processes  . Colon polyp   . Dental caries    prechemoradiation therapy, periodontitis  . Elevated blood pressure reading    White Coat Hypertension  . GERD (gastroesophageal reflux disease)   . Headache   . History of radiation therapy 09/11/16- 10/30/16   Larynx 70 Gy in 35 fractions  . HLD (hyperlipidemia)   . Hypothyroidism    radiation induced hypothyroidism  . Internal hemorrhoids   . Laryngeal cancer ([place])   . Pneumonia    as a child  . Tubular adenoma of colon    Current Outpatient Medications  Medication Sig Dispense Refill  . acetaminophen (TYLENOL) 500 MG tablet Take 1,000 mg by mouth every 6 (six) hours as needed for moderate pain or headache.    Marland Kitchen amLODipine (NORVASC) 5 MG tablet TAKE 1 TABLET BY MOUTH ONCE EVERY DAY 30 tablet 5  . Chlorpheniramine-PSE-Ibuprofen (ADVIL ALLERGY SINUS) 2-30-200 MG TABS Take 1 tablet by mouth daily as needed (allergies).    Marland Kitchen ibuprofen (ADVIL) 200 MG tablet Take 400 mg by mouth every 6 (six) hours as needed for headache or moderate pain.    Marland Kitchen levothyroxine (SYNTHROID) 125 MCG tablet Take 1 tablet (125 mcg total) by mouth daily before breakfast. 30 tablet 5  . omeprazole (PRILOSEC) 20 MG capsule Take 20 mg by mouth daily.    . Wheat Dextrin (BENEFIBER) POWD Take 1 Dose by mouth daily.     No current facility-administered medications for this visit.   Family History  Problem Relation Age of Onset  . Prostate cancer Maternal Uncle 66       On radiation therapy now, diagnosed in around June  2012  . Colon cancer Maternal Uncle   . Heart attack Paternal Grandfather        Diet due to MI  . Lung cancer Paternal Aunt   . Brain cancer Paternal Aunt   . Kidney disease Maternal Uncle   . Hypertension Mother   . CAD Father   . Colon polyps Neg Hx   . Diabetes Neg Hx   . Gallbladder disease Neg Hx   . Esophageal cancer Neg Hx    Social History   Socioeconomic History  . Marital status: Single    Spouse name: Not on file  . Number of children: 4  . Years of education: Not on file  . Highest education level: Not on file  Occupational History  . 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 Smoking status: Former Smoker    Packs/day: 0.50    Years: 30.00    Pack years: 15.00    Types: Cigarettes    Quit date: 12/31/2015    Years since quitting: 3.8  . Smokeless tobacco: Never Used  . Tobacco comment: not everyday-working on stopping completely   Vaping Use  . Vaping Use: Never used  Substance and Sexual Activity  . Alcohol use: No    Alcohol/week: 2.0 standard drinks    Types: 2 Cans of beer per  week  . Drug use: No    Frequency: 2.0 times per week    Comment: denies  . Sexual activity: Yes    Partners: Female  Other Topics Concern  . Not on file  Social History Narrative   Lives in [place] with his fiance.   Moved to [place] 11 years before. Used to live in [place] for 20 years before that.   Has 4 healthy kids from previous wife- separated now.   Is not working full-time since last 2 and half years- since early 2010.   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   Social Determinants of Health   Financial Resource Strain:   . Difficulty of Paying Living Expenses:   Food Insecurity:   . Worried About Charity fundraiser in the Last Year:   . Arboriculturist in the Last Year:   Transportation Needs:   . Film/video editor (Medical):   Marland Kitchen Lack of Transportation (Non-Medical):   Physical  Activity:   . Days of Exercise per Week:   . Minutes of Exercise per Session:   Stress:   . Feeling of Stress :   Social Connections:   . Frequency of Communication with Friends and Family:   . Frequency of Social Gatherings with Friends and Family:   . Attends Religious Services:   . Active Member of Clubs or Organizations:   . Attends Archivist Meetings:   Marland Kitchen Marital Status:     Review of Systems: Review of Systems  HENT:       Neck pain  Respiratory: Negative for cough.   Cardiovascular: Negative for chest pain.  Neurological: Positive for headaches.     Objective:  Physical Exam:  Vitals:   11/12/19 0941  BP: (!) 173/102  Pulse: 78  Temp: 98.2 F (36.8 C)  TempSrc: Oral  SpO2: 100%  Weight: 136 lb 6.4 oz (61.9 kg)  Height: 5\' 5"  (1.651 m)    Physical Exam Constitutional:      Appearance: Normal appearance.  HENT:     Head:     Comments: Tracheostomy speaking valve  Cardiovascular:     Rate and Rhythm: Normal rate and regular rhythm.     Heart sounds: Normal heart sounds.  Pulmonary:     Effort: Pulmonary effort is normal.     Breath sounds: Normal breath sounds.  Musculoskeletal:     Right lower leg: No edema.     Left lower leg: No edema.  Skin:    General: Skin is warm and dry.  Neurological:     Mental Status: He is alert.       Assessment & Plan:   See Encounters Tab for problem based charting.

## 2019-11-12 NOTE — Patient Instructions (Addendum)
Mr. Seymour,  It was a pleasure to see you today. I am sorry to hear about your neck and head pain. I have ordered a CT scan, you will be called to schedule this. I will call you with the results of your lab work. If everything looks ok, you can start taking ibuprofen for additional pain relief.    Your blood pressure is severely elevated. I have increased your amlodipine to 10 mg. You may take 2 pills of your current dose then start your new prescription for the 10 mg tablets. Please check your blood pressure at least 3 times a week and record the numbers. If you are persistently above 253 systolic at home, please check daily give me a call before your next appointment.    Continue taking your synthroid as prescribed. I will call you if we need to make any dose adjustments based on your lab results.   Follow up with me again in 3 months for your blood pressure.   If you have any questions or concerns, call our clinic at [phone] or after hours call [phone] and ask for the internal medicine resident on call.  Best,  Dr. Philipp Ovens

## 2019-11-13 ENCOUNTER — Encounter: Payer: Self-pay | Admitting: Internal Medicine

## 2019-11-13 LAB — CBC
Hematocrit: 44.5 % (ref 37.5–51.0)
Hemoglobin: 14.2 g/dL (ref 13.0–17.7)
MCH: 27.2 pg (ref 26.6–33.0)
MCHC: 31.9 g/dL (ref 31.5–35.7)
MCV: 85 fL (ref 79–97)
Platelets: 364 10*3/uL (ref 150–450)
RBC: 5.23 x10E6/uL (ref 4.14–5.80)
RDW: 19.6 % — ABNORMAL HIGH (ref 11.6–15.4)
WBC: 4.9 10*3/uL (ref 3.4–10.8)

## 2019-11-13 LAB — BMP8+ANION GAP
Anion Gap: 15 mmol/L (ref 10.0–18.0)
BUN/Creatinine Ratio: 12 (ref 10–24)
BUN: 13 mg/dL (ref 8–27)
CO2: 25 mmol/L (ref 20–29)
Calcium: 9.6 mg/dL (ref 8.6–10.2)
Chloride: 98 mmol/L (ref 96–106)
Creatinine, Ser: 1.05 mg/dL (ref 0.76–1.27)
GFR calc Af Amer: 88 mL/min/{1.73_m2} (ref >59)
GFR calc non Af Amer: 76 mL/min/{1.73_m2} (ref >59)
Glucose: 98 mg/dL (ref 65–99)
Potassium: 4 mmol/L (ref 3.5–5.2)
Sodium: 138 mmol/L (ref 134–144)

## 2019-11-13 LAB — TSH: TSH: 27.7 u[IU]/mL — ABNORMAL HIGH (ref 0.450–4.500)

## 2019-11-13 NOTE — Assessment & Plan Note (Signed)
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 He has previously been controlled on synthroid 125 mcg daily. TSH today is significantly elevated at 27. Called and discussed results with his significant other Jonathan [name]. She does not believe he is taking his synthroid daily. She will help him work on compliance. Reiterated that the medication should be taken in the morning on an empty stomach with water, 30 minutes before eating or taking any other medication. She expressed understanding. Will recheck TSH at his follow up in 3 months.

## 2019-11-13 NOTE — Assessment & Plan Note (Signed)
Patient has chronic knee pain 2/2 to bilateral osteoarthritis. He usually receives intra articular steroid injections every 3-6 months which provide significant pain relief. He is doing well today. His last injection was over 6 months ago. He currently has no pain. Will continue to monitor. Continue knee injections as needed.

## 2019-11-13 NOTE — Assessment & Plan Note (Signed)
Blood pressure uncontrolled, 173/102 and 185/106 on recheck. He reports compliance with amlodipine. He does have a component of white coat hypertension, we have done multiple ambulatory BP monitoring logs. However given his degree of HTN, I have recommended we increase amlodipine to 10 mg daily. He is asymptomatic. Labs today are reassuring, normal BMP. He has agreed to start checking his blood pressure again at home, at least 3 x weekly. He will call if he is persistently elevated at home, in which case I would recommended adding a second agent. Otherwise will have him follow up in 3 months.

## 2019-11-13 NOTE — Assessment & Plan Note (Signed)
Patient has a history of invasive squamous cell carcinoma of his supraglottisdiagnosed in 2018 requiringtracheostomy due to severe airway compromise. Patient completed chemotherapy (Cisplatin, Fluorouracil, and Docetaxel) and radiationtherapy, unfortunately developed recurrence in April of 2019 now s/p laryngectomy with a speaking valve. He follows with oncology and ENT and is considered to be in remission. His last surveillance imaging was in March of 2019. He saw Dr. Alvy Bimler with oncology 2 months ago and was doing well at the time. Since then, he has developed neck pain along right aspect of his neck (11 o'clock position from his tracheostomy speaking valve). The pain radiates up his neck with associated occipital headache. The pain is daily and constant. Persistent now x 1 month. Given his history of malignancy, we discussed the need for repeat imaging to rule out recurrence. Patient is agreeable. He has been taking tylenol only for the pain. CBC and BMP today are WNL, advised NSAIDs prn for additional relief.  -- CT head and CT soft tissue neck [place] contrast

## 2019-11-25 DIAGNOSIS — H538 Other visual disturbances: Secondary | ICD-10-CM | POA: Diagnosis not present

## 2019-11-25 DIAGNOSIS — R519 Headache, unspecified: Secondary | ICD-10-CM | POA: Diagnosis not present

## 2019-11-25 DIAGNOSIS — H539 Unspecified visual disturbance: Secondary | ICD-10-CM | POA: Diagnosis not present

## 2019-11-25 DIAGNOSIS — G44229 Chronic tension-type headache, not intractable: Secondary | ICD-10-CM | POA: Diagnosis not present

## 2020-02-03 ENCOUNTER — Encounter: Payer: Medicaid Other | Admitting: Internal Medicine

## 2020-02-23 DIAGNOSIS — Z430 Encounter for attention to tracheostomy: Secondary | ICD-10-CM | POA: Diagnosis not present

## 2020-03-02 IMAGING — CR DG ABDOMEN 1V
1 series · 1 of 1 positions shown · non-contrast
Comparison: None.

CLINICAL DATA: Assess feeding tube.

EXAM:
ABDOMEN - 1 VIEW

[w abdomen upright]
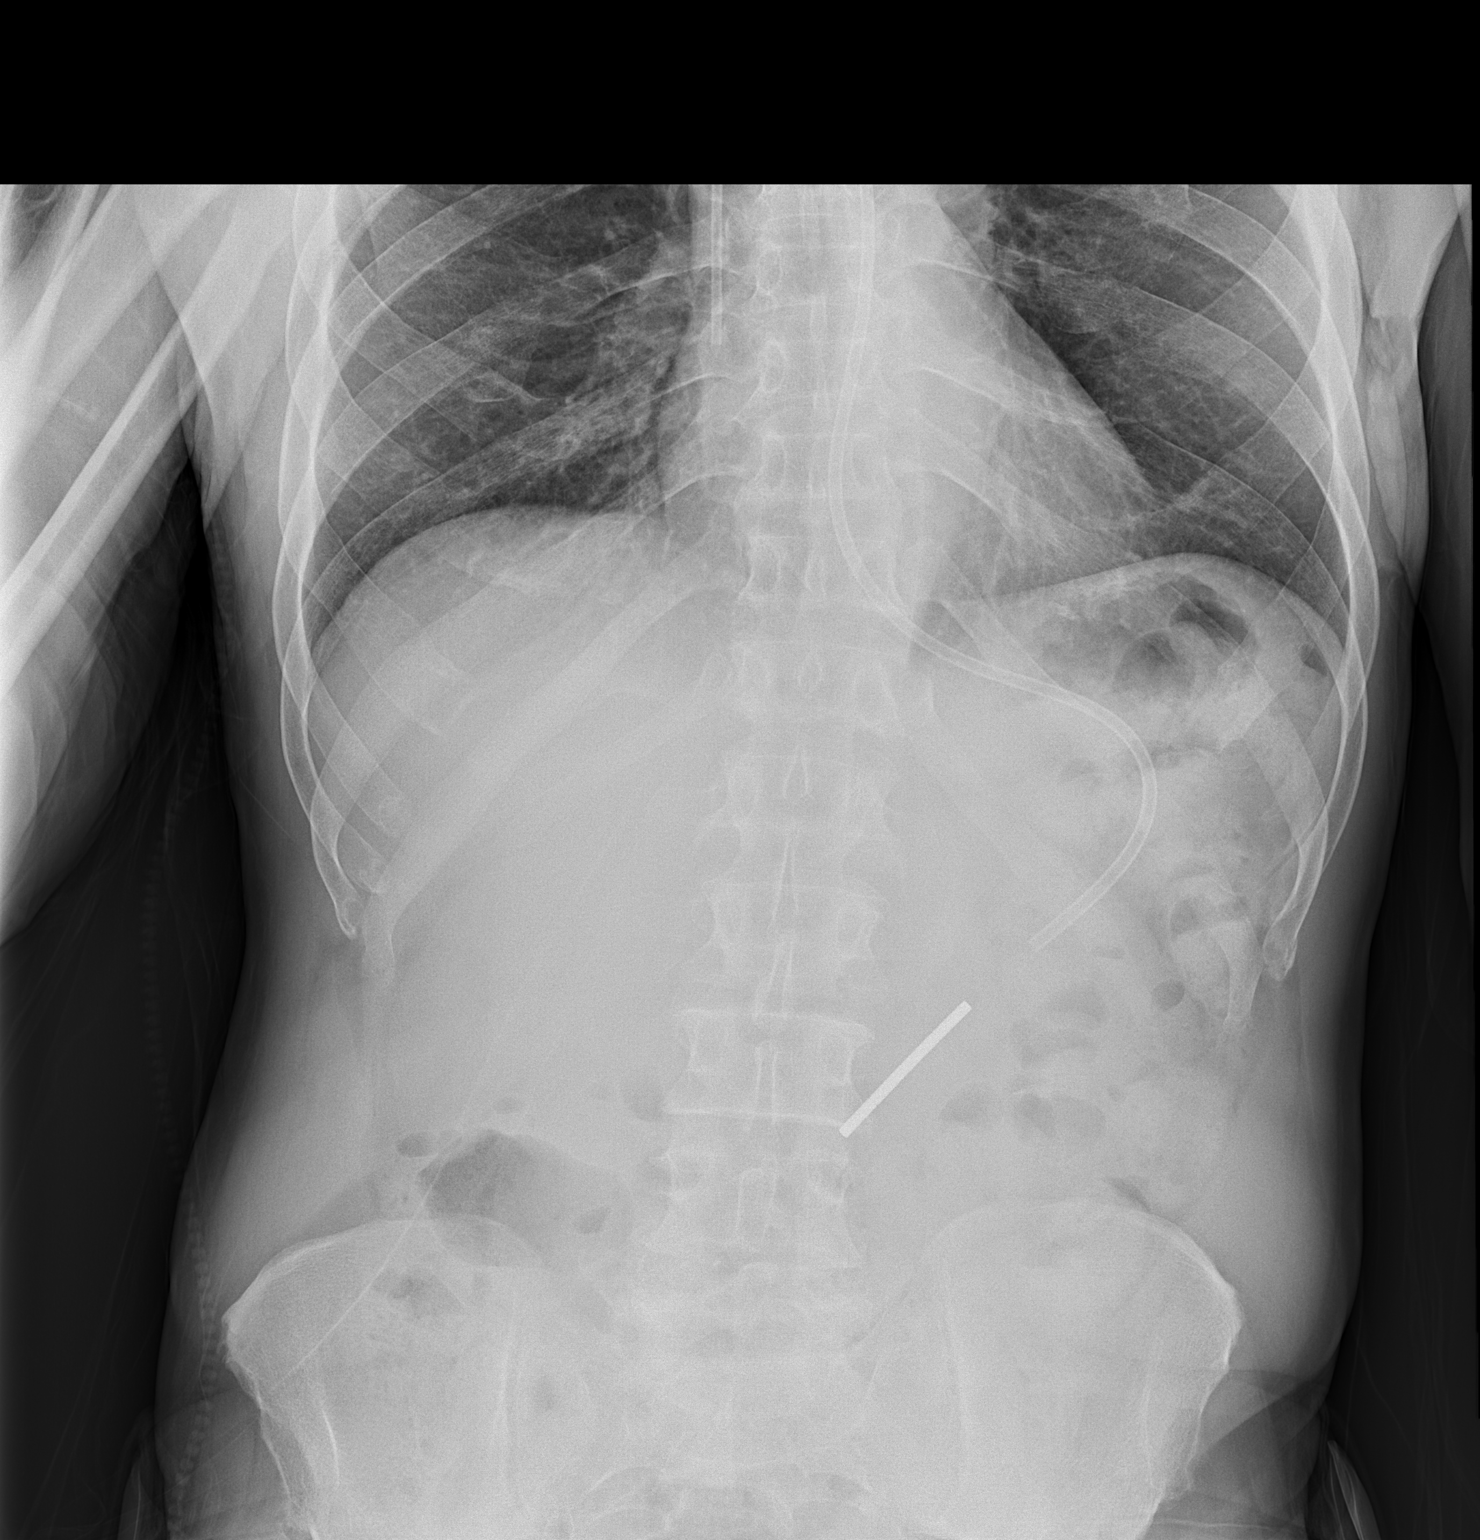

[1 of 1 positions shown; findings below may reference images not displayed]

FINDINGS: Examination demonstrates enteric tube with tip over the left mid
abdomen likely over the distal stomach. There is a central venous
catheter with tip over the cavoatrial junction. Bowel gas pattern is
nonobstructive. Remainder the exam is unremarkable.
IMPRESSION: No acute findings.

Tubes and lines as described.

## 2020-03-30 DIAGNOSIS — Z430 Encounter for attention to tracheostomy: Secondary | ICD-10-CM | POA: Diagnosis not present

## 2020-04-29 ENCOUNTER — Other Ambulatory Visit: Payer: Self-pay

## 2020-04-29 DIAGNOSIS — E039 Hypothyroidism, unspecified: Secondary | ICD-10-CM

## 2020-04-29 DIAGNOSIS — I10 Essential (primary) hypertension: Secondary | ICD-10-CM

## 2020-04-29 MED ORDER — LEVOTHYROXINE SODIUM 125 MCG PO TABS: 125.0000 ug | ORAL_TABLET | Freq: Every day | ORAL | 2 refills | Status: DC

## 2020-04-29 MED ORDER — AMLODIPINE BESYLATE 10 MG PO TABS: 10.0000 mg | ORAL_TABLET | Freq: Every day | ORAL | 2 refills | Status: DC

## 2020-04-29 NOTE — Telephone Encounter (Signed)
Approved 3 month refill of synthroid and amlodipine. Please have patient schedule follow up with me.

## 2020-04-29 NOTE — Telephone Encounter (Signed)
Front office - schedule pt an appt with Dr Philipp Ovens. Thanks

## 2020-04-29 NOTE — Telephone Encounter (Signed)
  levothyroxine (SYNTHROID) 125 MCG tablet,  amLODipine (NORVASC) 10 MG tablet, refill request @  [facility] [phone] - [place], [place] - [address] AT [address] Phone:  [phone]  Fax:  [phone]

## 2020-05-03 ENCOUNTER — Encounter: Payer: Self-pay | Admitting: Internal Medicine

## 2020-05-05 ENCOUNTER — Telehealth: Payer: Self-pay

## 2020-05-05 ENCOUNTER — Encounter: Payer: Medicaid Other | Admitting: Student

## 2020-05-05 NOTE — Telephone Encounter (Signed)
Pt would like to know what kind of shot he get for his knee, please call pt back.

## 2020-05-05 NOTE — Telephone Encounter (Signed)
Return pt's call - "call cannot be completed at this time"; unable to leave a message.

## 2020-06-30 ENCOUNTER — Encounter: Payer: Self-pay | Admitting: Internal Medicine

## 2020-06-30 ENCOUNTER — Ambulatory Visit (INDEPENDENT_AMBULATORY_CARE_PROVIDER_SITE_OTHER): Payer: Medicaid Other | Admitting: Internal Medicine

## 2020-06-30 ENCOUNTER — Other Ambulatory Visit: Payer: Self-pay

## 2020-06-30 VITALS — BP 154/90 | HR 99 | Temp 98.2°F | Ht 65.0 in | Wt 138.8 lb

## 2020-06-30 DIAGNOSIS — I10 Essential (primary) hypertension: Secondary | ICD-10-CM | POA: Diagnosis not present

## 2020-06-30 DIAGNOSIS — C321 Malignant neoplasm of supraglottis: Secondary | ICD-10-CM

## 2020-06-30 DIAGNOSIS — M170 Bilateral primary osteoarthritis of knee: Secondary | ICD-10-CM

## 2020-06-30 DIAGNOSIS — E785 Hyperlipidemia, unspecified: Secondary | ICD-10-CM

## 2020-06-30 DIAGNOSIS — E039 Hypothyroidism, unspecified: Secondary | ICD-10-CM

## 2020-06-30 DIAGNOSIS — E032 Hypothyroidism due to medicaments and other exogenous substances: Secondary | ICD-10-CM

## 2020-06-30 NOTE — Patient Instructions (Addendum)
Mr. Ala,  It was a pleasure to see you today. I will call you with the results of your blood work. Continue to take your medicines as prescribed.  For you knees, today we injected 1 mL of kenalog (40 mg) and 2 mL of 1% lidocaine in each knee.    Follow up with me again in 6 months or sooner if needed.   If you have any questions or concerns, call our clinic at [phone] or after hours call [phone] and ask for the internal medicine resident on call. Thank you!  Dr. Alesia Morin

## 2020-06-30 NOTE — Assessment & Plan Note (Signed)
Patient has chronic knee pain 2/2 to bilateral osteoarthritis. He receives intra articular steroid injections every 3-6 months. This provides significant pain relief for him. His last injection was over one year ago. Requesting repeat injections today.   Procedure Note  Indication:  Bilateral knee osteoarthritis, pain relief   Operators: Dr. Philipp Ovens   The patient was provided with risks, benefits, and alternatives to intraarticular injection. He consented to intraarticular knee injection for bilateral knee pain.  After a time out was preformed, the knee was prepped in a sterile fashion. Cold spray was applied to the skin over the insertion site (lateral inferior patellar aspect of the knee while in the seated position). A mixture of 2 cc 1% lidocaine and 40 mg of Kenalog was injected into the knee using a 25 gauge 1 and 1/2 inch needle. Both knee spaces were entered without difficulty, entered successfully after the first attempt. The patient tolerated the procedures well without immediate complication.

## 2020-06-30 NOTE — Progress Notes (Signed)
Subjective:   Patient ID: Jonathan [name] male   DOB: Oct 24, 1960 60 y.o.   MRN: [id number]  HPI: Jonathan [name] is a 60 y.o. male with past medical history outlined below here for HTN and Hypothyroidism follow up. For the details of today's visit, please refer to the assessment and plan.   Past Medical History:  Diagnosis Date  . Allergic rhinitis   . Arthritis    Bilateral knee arthritis for about 20 years. Not taking any medications, believes in nature processes  . Colon polyp   . Dental caries    prechemoradiation therapy, periodontitis  . Elevated blood pressure reading    White Coat Hypertension  . GERD (gastroesophageal reflux disease)   . Headache   . History of radiation therapy 09/11/16- 10/30/16   Larynx 70 Gy in 35 fractions  . HLD (hyperlipidemia)   . Hypothyroidism    radiation induced hypothyroidism  . Internal hemorrhoids   . Laryngeal cancer ([place])   . Pneumonia    as a child  . Tubular adenoma of colon    Current Outpatient Medications  Medication Sig Dispense Refill  . acetaminophen (TYLENOL) 500 MG tablet Take 1,000 mg by mouth every 6 (six) hours as needed for moderate pain or headache.    Marland Kitchen amLODipine (NORVASC) 10 MG tablet Take 1 tablet (10 mg total) by mouth daily. 30 tablet 2  . Chlorpheniramine-PSE-Ibuprofen (ADVIL ALLERGY SINUS) 2-30-200 MG TABS Take 1 tablet by mouth daily as needed (allergies).    Marland Kitchen ibuprofen (ADVIL) 200 MG tablet Take 400 mg by mouth every 6 (six) hours as needed for headache or moderate pain.    Marland Kitchen levothyroxine (SYNTHROID) 125 MCG tablet Take 1 tablet (125 mcg total) by mouth daily before breakfast. 30 tablet 2  . omeprazole (PRILOSEC) 20 MG capsule Take 20 mg by mouth daily.    . Wheat Dextrin (BENEFIBER) POWD Take 1 Dose by mouth daily.     No current facility-administered medications for this visit.   Family History  Problem Relation Age of Onset  . Prostate cancer Maternal Uncle 66       On radiation therapy now, diagnosed  in around June 2012  . Colon cancer Maternal Uncle   . Heart attack Paternal Grandfather        Diet due to MI  . Lung cancer Paternal Aunt   . Brain cancer Paternal Aunt   . Kidney disease Maternal Uncle   . Hypertension Mother   . CAD Father   . Colon polyps Neg Hx   . Diabetes Neg Hx   . Gallbladder disease Neg Hx   . Esophageal cancer Neg Hx    Social History   Socioeconomic History  . Marital status: Single    Spouse name: Not on file  . Number of children: 4  . Years of education: Not on file  . Highest education level: Not on file  Occupational History  . 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 Smoking status: Former Smoker    Packs/day: 0.50    Years: 30.00    Pack years: 15.00    Types: Cigarettes    Quit date: 12/31/2015    Years since quitting: 4.5  . Smokeless tobacco: Never Used  . Tobacco comment: not everyday-working on stopping completely   Vaping Use  . Vaping Use: Never used  Substance and Sexual Activity  . Alcohol use: No    Alcohol/week: 2.0 standard drinks    Types: 2 Cans  of beer per week  . Drug use: No    Frequency: 2.0 times per week    Comment: denies  . Sexual activity: Yes    Partners: Female  Other Topics Concern  . Not on file  Social History Narrative   Lives in [place] with his fiance.   Moved to [place] 11 years before. Used to live in [place] for 20 years before that.   Has 4 healthy kids from previous wife- separated now.   Is not working full-time since last 2 and half years- since early 2010.   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   Social Determinants of Health   Financial Resource Strain: Not on file  Food Insecurity: Not on file  Transportation Needs: Not on file  Physical Activity: Not on file  Stress: Not on file  Social Connections: Not on file    Review of Systems: Review of Systems  Respiratory: Negative for shortness of breath.    Cardiovascular: Negative for chest pain.     Objective:  Physical Exam:  Vitals:   06/30/20 0921  BP: (!) 143/89  Pulse: 97  Temp: 98.2 F (36.8 C)  TempSrc: Oral  SpO2: 98%  Weight: 138 lb 12.8 oz (63 kg)  Height: 5\' 5"  (1.651 m)    Physical Exam Constitutional:      Appearance: Normal appearance.  HENT:     Mouth/Throat:     Comments: Tracheostomy speaking valve  Cardiovascular:     Rate and Rhythm: Normal rate and regular rhythm.     Heart sounds: Normal heart sounds.  Pulmonary:     Effort: Pulmonary effort is normal. No respiratory distress.     Breath sounds: Normal breath sounds.  Musculoskeletal:     Right lower leg: No edema.     Left lower leg: No edema.  Skin:    General: Skin is warm and dry.  Neurological:     Mental Status: He is alert and oriented to person, place, and time.      Assessment & Plan:   See Encounters Tab for problem based charting.

## 2020-06-30 NOTE — Assessment & Plan Note (Signed)
BP still elevated but improved from last visit. He has a history of known white coat hypertension, we have done multiple ambulatory BP logs. He checks his BP twice a week at home and reports he is consistently in the low 834P systolic. He will continue to check his BP and call if it becomes elevated. Continue amlodipine 10 mg daily for now.

## 2020-06-30 NOTE — Assessment & Plan Note (Addendum)
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 Previously controlled on 125 mcg daily but TSH was elevated at his last visit. He admitted to non compliance, was taking synthroid inconsistently. He has been taking it daily since that visit. Rechecking TSH today, if still elevated will plan to adjust dose. Otherwise continue 125 mcg daily.   ADDENDUM: TSH is low, 0.136. I suspect he is supra therapeutic now that he is taking his medication appropriately (and daily). Called patient, he is asymptomatic. Plan to decrease synthroid to 112 mcg. He will follow up in 3 months for repeat TSH.

## 2020-07-01 LAB — BMP8+ANION GAP
Anion Gap: 23 mmol/L — ABNORMAL HIGH (ref 10.0–18.0)
BUN/Creatinine Ratio: 13 (ref 10–24)
BUN: 12 mg/dL (ref 8–27)
CO2: 19 mmol/L — ABNORMAL LOW (ref 20–29)
Calcium: 10.2 mg/dL (ref 8.6–10.2)
Chloride: 99 mmol/L (ref 96–106)
Creatinine, Ser: 0.94 mg/dL (ref 0.76–1.27)
Glucose: 102 mg/dL — ABNORMAL HIGH (ref 65–99)
Potassium: 4.1 mmol/L (ref 3.5–5.2)
Sodium: 141 mmol/L (ref 134–144)
eGFR: 92 mL/min/{1.73_m2} (ref >59)

## 2020-07-01 LAB — LIPID PANEL
Chol/HDL Ratio: 6.7 ratio — ABNORMAL HIGH (ref 0.0–5.0)
Cholesterol, Total: 317 mg/dL — ABNORMAL HIGH (ref 100–199)
HDL: 47 mg/dL (ref >39)
LDL Chol Calc (NIH): 246 mg/dL — ABNORMAL HIGH (ref 0–99)
Triglycerides: 129 mg/dL (ref 0–149)
VLDL Cholesterol Cal: 24 mg/dL (ref 5–40)

## 2020-07-01 LAB — TSH: TSH: 0.136 u[IU]/mL — ABNORMAL LOW (ref 0.450–4.500)

## 2020-07-01 LAB — CBC
Hematocrit: 49 % (ref 37.5–51.0)
Hemoglobin: 16 g/dL (ref 13.0–17.7)
MCH: 28.3 pg (ref 26.6–33.0)
MCHC: 32.7 g/dL (ref 31.5–35.7)
MCV: 87 fL (ref 79–97)
Platelets: 374 10*3/uL (ref 150–450)
RBC: 5.66 x10E6/uL (ref 4.14–5.80)
RDW: 15.4 % (ref 11.6–15.4)
WBC: 5.1 10*3/uL (ref 3.4–10.8)

## 2020-07-01 MED ORDER — LEVOTHYROXINE SODIUM 112 MCG PO TABS: 112.0000 ug | ORAL_TABLET | Freq: Every day | ORAL | 2 refills | Status: DC

## 2020-07-01 MED ORDER — ROSUVASTATIN CALCIUM 20 MG PO TABS: 20.0000 mg | ORAL_TABLET | Freq: Every day | ORAL | 11 refills | Status: DC

## 2020-07-01 NOTE — Addendum Note (Signed)
Addended by: Jodean Lima on: 07/01/2020 02:46 PM   Modules accepted: Orders

## 2020-07-01 NOTE — Assessment & Plan Note (Signed)
Lipid panel checked this visit with significantly elevated LDL of 246. 10 year ASCVD risk is 32.4% based on today's visit. Called patient and discussed results. Will plan to start crestor 20 mg daily. I suspect he will need additional add on therapy like ezetimibe or PSK

## 2020-08-04 ENCOUNTER — Other Ambulatory Visit: Payer: Self-pay | Admitting: Internal Medicine

## 2020-08-04 ENCOUNTER — Encounter: Payer: Medicaid Other | Admitting: Internal Medicine

## 2020-08-04 DIAGNOSIS — I10 Essential (primary) hypertension: Secondary | ICD-10-CM

## 2020-08-04 DIAGNOSIS — E039 Hypothyroidism, unspecified: Secondary | ICD-10-CM

## 2020-08-04 NOTE — Telephone Encounter (Signed)
Approved amlodipine and synthroid refill requests. Synthroid was decreased to 112 mcg at his last visit, prescription was changed to reflex this.

## 2020-08-27 DIAGNOSIS — Z430 Encounter for attention to tracheostomy: Secondary | ICD-10-CM | POA: Diagnosis not present

## 2020-09-01 ENCOUNTER — Other Ambulatory Visit: Payer: Self-pay | Admitting: Internal Medicine

## 2020-09-01 ENCOUNTER — Telehealth: Payer: Self-pay | Admitting: Hematology and Oncology

## 2020-09-01 DIAGNOSIS — E039 Hypothyroidism, unspecified: Secondary | ICD-10-CM

## 2020-09-01 NOTE — Telephone Encounter (Signed)
R/s appts per 5/31 sch msg. Called pt, no answer. Left msg with updated appt date and time.

## 2020-09-02 ENCOUNTER — Inpatient Hospital Stay: Payer: Medicaid Other | Admitting: Hematology and Oncology

## 2020-09-02 NOTE — Telephone Encounter (Signed)
Schedule pt a f/u appt with Dr Philipp Ovens. Thanks

## 2020-09-02 NOTE — Telephone Encounter (Signed)
Approved short term refill of synthroid. Please have patient schedule follow up with me.

## 2020-09-14 ENCOUNTER — Inpatient Hospital Stay: Payer: Medicaid Other | Attending: Hematology and Oncology | Admitting: Hematology and Oncology

## 2020-10-13 DIAGNOSIS — Z430 Encounter for attention to tracheostomy: Secondary | ICD-10-CM | POA: Diagnosis not present

## 2020-10-20 ENCOUNTER — Other Ambulatory Visit: Payer: Self-pay

## 2020-10-20 ENCOUNTER — Encounter: Payer: Self-pay | Admitting: Internal Medicine

## 2020-10-20 ENCOUNTER — Ambulatory Visit (INDEPENDENT_AMBULATORY_CARE_PROVIDER_SITE_OTHER): Payer: Medicaid Other | Admitting: Internal Medicine

## 2020-10-20 VITALS — BP 166/108 | HR 93 | Temp 98.5°F | Ht 65.0 in | Wt 132.5 lb

## 2020-10-20 DIAGNOSIS — Z23 Encounter for immunization: Secondary | ICD-10-CM | POA: Diagnosis not present

## 2020-10-20 DIAGNOSIS — I10 Essential (primary) hypertension: Secondary | ICD-10-CM

## 2020-10-20 DIAGNOSIS — E7841 Elevated Lipoprotein(a): Secondary | ICD-10-CM

## 2020-10-20 DIAGNOSIS — E032 Hypothyroidism due to medicaments and other exogenous substances: Secondary | ICD-10-CM

## 2020-10-20 MED ORDER — ZOSTER VAC RECOMB ADJUVANTED 50 MCG/0.5ML IM SUSR: 0.5000 mL | Freq: Once | INTRAMUSCULAR | 0 refills | Status: AC

## 2020-10-20 MED ORDER — AMLODIPINE-OLMESARTAN 10-20 MG PO TABS: 1.0000 | ORAL_TABLET | Freq: Every day | ORAL | 2 refills | Status: DC

## 2020-10-20 NOTE — Assessment & Plan Note (Signed)
Blood pressure is uncontrolled.  Persistently elevated on repeat check.  Currently taking amlodipine 10 mg daily.  Labs checked at his last visit with normal renal function.  Changing to combination amlodipine-losartan-20 mg daily.  Follow-up in 1 month for blood pressure recheck and BMP.

## 2020-10-20 NOTE — Assessment & Plan Note (Signed)
Synthroid was decreased at his last visit from 125 to 112 mcg daily.  He has been taking his Synthroid first thing in the morning on an empty stomach, however he has been taking it with his omeprazole.  Advised patient not to take these medications together.  Repeating TSH today.

## 2020-10-20 NOTE — Progress Notes (Signed)
Subjective:   Patient ID: Jonathan [name] male   DOB: Aug 17, 1960 60 y.o.   MRN: [id number]  HPI: [name] is a 60 y.o. male with past medical history outlined below here for HTN and hypothyroidism follow up. For the details of today's visit, please refer to the assessment and plan.   Past Medical History:  Diagnosis Date   Allergic rhinitis    Arthritis    Bilateral knee arthritis for about 20 years. Not taking any medications, believes in nature processes   Colon polyp    Dental caries    prechemoradiation therapy, periodontitis   Elevated blood pressure reading    White Coat Hypertension   GERD (gastroesophageal reflux disease)    Headache    History of radiation therapy 09/11/16- 10/30/16   Larynx 70 Gy in 35 fractions   HLD (hyperlipidemia)    Hypothyroidism    radiation induced hypothyroidism   Internal hemorrhoids    Laryngeal cancer ([place])    Pneumonia    as a child   Tubular adenoma of colon    Current Outpatient Medications  Medication Sig Dispense Refill   amlodipine-olmesartan (AZOR) 10-20 MG tablet Take 1 tablet by mouth daily. 30 tablet 2   Zoster Vaccine Adjuvanted [facility]) injection Inject 0.5 mLs into the muscle once for 1 dose. 0.5 mL 0   acetaminophen (TYLENOL) 500 MG tablet Take 1,000 mg by mouth every 6 (six) hours as needed for moderate pain or headache.     Chlorpheniramine-PSE-Ibuprofen (ADVIL ALLERGY SINUS) 2-30-200 MG TABS Take 1 tablet by mouth daily as needed (allergies).     ibuprofen (ADVIL) 200 MG tablet Take 400 mg by mouth every 6 (six) hours as needed for headache or moderate pain.     levothyroxine (SYNTHROID) 112 MCG tablet Take 1 tablet (112 mcg total) by mouth daily before breakfast. 30 tablet 2   levothyroxine (SYNTHROID) 112 MCG tablet TAKE 1 TABLET(112 MCG) BY MOUTH DAILY BEFORE BREAKFAST 90 tablet 0   omeprazole (PRILOSEC) 20 MG capsule Take 20 mg by mouth daily.     rosuvastatin (CRESTOR) 20 MG tablet Take 1 tablet (20 mg total)  by mouth daily. 30 tablet 11   Wheat Dextrin (BENEFIBER) POWD Take 1 Dose by mouth daily.     No current facility-administered medications for this visit.   Family History  Problem Relation Age of Onset   Prostate cancer Maternal Uncle 66       On radiation therapy now, diagnosed in around June 2012   Colon cancer Maternal Uncle    Heart attack Paternal Grandfather        Diet due to MI   Lung cancer Paternal Aunt    Brain cancer Paternal Aunt    Kidney disease Maternal Uncle    Hypertension Mother    CAD Father    Colon polyps Neg Hx    Diabetes Neg Hx    Gallbladder disease Neg Hx    Esophageal cancer Neg Hx    Social History   Socioeconomic History   Marital status: Single    Spouse name: Not on file   Number of children: 4   Years of education: Not on file   Highest education level: Not on file  Occupational History   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   Drug use: No    Frequency: 2.0 times per week    Comment: denies   Sexual activity: Yes    Partners: Female  Other Topics Concern   Not on file  Social History Narrative   Lives in [place] with his fiance.   Moved to [place] 11 years before. Used to live in [place] for 20 years before that.   Has 4 healthy kids from previous wife- separated now.   Is not working full-time since last 2 and half years- since early 2010.   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   Social Determinants of Health   Financial Resource Strain: Not on file  Food Insecurity: Not on file  Transportation Needs: Not on file  Physical Activity: Not on file   Stress: Not on file  Social Connections: Not on file    Review of Systems: Review of Systems  Respiratory:  Negative for shortness of breath.   Cardiovascular:  Negative for chest pain.    Objective:  Physical Exam:  Vitals:   10/20/20 1012 10/20/20 1042 10/20/20 1047  BP: (!) 148/98 (!) 163/100 (!) 166/108  Pulse: 90 90 93  Temp: 98.5 F (36.9 C)    TempSrc: Oral    SpO2: 99%    Weight: 132 lb 8 oz (60.1 kg)    Height: 5\' 5"  (1.651 m)      Physical Exam Constitutional:      Appearance: Normal appearance.  HENT:     Head:     Comments: Tracheostomy speaking valve  Cardiovascular:     Rate and Rhythm: Normal rate and regular rhythm.  Pulmonary:     Effort: Pulmonary effort is normal.     Breath sounds: Normal breath sounds.  Musculoskeletal:     Right lower leg: No edema.     Left lower leg: No edema.  Skin:    General: Skin is warm and dry.  Neurological:     Mental Status: He is alert.  Psychiatric:        Mood and Affect: Mood normal.        Behavior: Behavior normal.     Assessment & Plan:   See Encounters Tab for problem based charting.

## 2020-10-20 NOTE — Assessment & Plan Note (Signed)
Patient was started on Crestor 20 mg daily at his last visit due to hyperlipidemia with LDL of 246 and elevated 10-year ASCVD risk of 32%.  He reports compliance with Crestor and denies any side effects.  Rechecking lipid panel today.  Plan to increase to crestor 40 mg if not at LDL goal of <100.

## 2020-10-20 NOTE — Patient Instructions (Addendum)
[name],  It was a pleasure to see you today.   I have changed your blood pressure medication to a combination pill including two medications, amlodipine and olmesartan.   Please take your synthroid (aka levothyroxine) first thing in the morning when you wake up on an empty stomach with water. Wait at least 30 minutes before eating or taking your other medications. You can take your blood pressure medicine, Prilosec (aka omeprazole), and Crestor (rosuvastatin) together before breakfast.   Follow up with me again in 1 month to have your blood pressure rechecked. If you are doing well at that visit we can space our your appointments to 6 months. f you have any questions or concerns, call our clinic at [phone] or after hours call [phone] and ask for the internal medicine resident on call.   Thank you!  Dr. Darnell Level

## 2020-10-21 LAB — LIPID PANEL
Chol/HDL Ratio: 3.4 ratio (ref 0.0–5.0)
Cholesterol, Total: 196 mg/dL (ref 100–199)
HDL: 57 mg/dL (ref >39)
LDL Chol Calc (NIH): 108 mg/dL — ABNORMAL HIGH (ref 0–99)
Triglycerides: 181 mg/dL — ABNORMAL HIGH (ref 0–149)
VLDL Cholesterol Cal: 31 mg/dL (ref 5–40)

## 2020-10-21 LAB — TSH: TSH: 5.2 u[IU]/mL — ABNORMAL HIGH (ref 0.450–4.500)

## 2020-10-22 ENCOUNTER — Other Ambulatory Visit: Payer: Self-pay | Admitting: Internal Medicine

## 2020-10-22 DIAGNOSIS — E039 Hypothyroidism, unspecified: Secondary | ICD-10-CM

## 2020-10-22 DIAGNOSIS — E785 Hyperlipidemia, unspecified: Secondary | ICD-10-CM

## 2020-10-22 MED ORDER — LEVOTHYROXINE SODIUM 112 MCG PO TABS: 112.0000 ug | ORAL_TABLET | Freq: Every day | ORAL | 3 refills | Status: DC

## 2020-10-22 MED ORDER — ROSUVASTATIN CALCIUM 40 MG PO TABS: 40.0000 mg | ORAL_TABLET | Freq: Every day | ORAL | 11 refills | Status: DC

## 2020-11-10 ENCOUNTER — Encounter: Payer: Medicaid Other | Admitting: Internal Medicine

## 2020-11-15 ENCOUNTER — Other Ambulatory Visit: Payer: Self-pay | Admitting: Internal Medicine

## 2020-11-15 DIAGNOSIS — I10 Essential (primary) hypertension: Secondary | ICD-10-CM

## 2020-12-12 DIAGNOSIS — U071 COVID-19: Secondary | ICD-10-CM | POA: Diagnosis not present

## 2020-12-12 DIAGNOSIS — Z20822 Contact with and (suspected) exposure to covid-19: Secondary | ICD-10-CM | POA: Diagnosis not present

## 2020-12-22 ENCOUNTER — Other Ambulatory Visit: Payer: Self-pay | Admitting: Internal Medicine

## 2020-12-22 DIAGNOSIS — I10 Essential (primary) hypertension: Secondary | ICD-10-CM

## 2020-12-24 NOTE — Telephone Encounter (Signed)
Approved amlodipine-olmesartan refill request. Please have patient schedule follow up with me. Thank you!

## 2021-01-11 DIAGNOSIS — Z430 Encounter for attention to tracheostomy: Secondary | ICD-10-CM | POA: Diagnosis not present

## 2021-01-26 ENCOUNTER — Other Ambulatory Visit: Payer: Self-pay

## 2021-01-26 ENCOUNTER — Ambulatory Visit: Payer: Medicaid Other | Admitting: Internal Medicine

## 2021-01-26 ENCOUNTER — Encounter: Payer: Self-pay | Admitting: Internal Medicine

## 2021-01-26 VITALS — BP 145/95 | HR 87 | Temp 98.1°F | Ht 65.0 in | Wt 134.4 lb

## 2021-01-26 DIAGNOSIS — E7841 Elevated Lipoprotein(a): Secondary | ICD-10-CM | POA: Diagnosis not present

## 2021-01-26 DIAGNOSIS — I10 Essential (primary) hypertension: Secondary | ICD-10-CM

## 2021-01-26 DIAGNOSIS — Z23 Encounter for immunization: Secondary | ICD-10-CM | POA: Diagnosis not present

## 2021-01-26 DIAGNOSIS — E032 Hypothyroidism due to medicaments and other exogenous substances: Secondary | ICD-10-CM

## 2021-01-26 NOTE — Patient Instructions (Signed)
Mr. Fina,  It was a pleasure to see you today. Please continue to take all of your medications as previously prescribed. I will call you with the results of your blood work. Follow up with me again in 6 months. Please give me a call if you start having dizziness again.   If you have any questions or concerns, call our clinic at [phone] or after hours call [phone] and ask for the internal medicine resident on call.   Thank you!  Dr. Darnell Level

## 2021-01-26 NOTE — Progress Notes (Signed)
Subjective:   Patient ID: Jonathan [name] male   DOB: [birth year] 60 y.o.   MRN: [id number]  HPI: Jonathan [name] is a 60 y.o. male with past medical history outlined below here for follow up of HTN and hypothyroidism. For the details of today's visit, please refer to the assessment and plan.   Past Medical History:  Diagnosis Date   Allergic rhinitis    Arthritis    Bilateral knee arthritis for about 20 years. Not taking any medications, believes in nature processes   Colon polyp    Dental caries    prechemoradiation therapy, periodontitis   Elevated blood pressure reading    White Coat Hypertension   GERD (gastroesophageal reflux disease)    Headache    History of radiation therapy 09/11/16- 10/30/16   Larynx 70 Gy in 35 fractions   HLD (hyperlipidemia)    Hypothyroidism    radiation induced hypothyroidism   Internal hemorrhoids    Laryngeal cancer ([place])    Pneumonia    as a child   Tubular adenoma of colon    Current Outpatient Medications  Medication Sig Dispense Refill   acetaminophen (TYLENOL) 500 MG tablet Take 1,000 mg by mouth every 6 (six) hours as needed for moderate pain or headache.     amlodipine-olmesartan (AZOR) 10-20 MG tablet TAKE 1 TABLET BY MOUTH DAILY 30 tablet 2   Chlorpheniramine-PSE-Ibuprofen (ADVIL ALLERGY SINUS) 2-30-200 MG TABS Take 1 tablet by mouth daily as needed (allergies).     ibuprofen (ADVIL) 200 MG tablet Take 400 mg by mouth every 6 (six) hours as needed for headache or moderate pain.     levothyroxine (SYNTHROID) 112 MCG tablet Take 1 tablet (112 mcg total) by mouth daily before breakfast. 30 tablet 2   levothyroxine (SYNTHROID) 112 MCG tablet Take 1 tablet (112 mcg total) by mouth daily before breakfast. 90 tablet 3   omeprazole (PRILOSEC) 20 MG capsule Take 20 mg by mouth daily.     rosuvastatin (CRESTOR) 40 MG tablet Take 1 tablet (40 mg total) by mouth daily. 30 tablet 11   Wheat Dextrin (BENEFIBER) POWD Take 1 Dose by mouth daily.      No current facility-administered medications for this visit.   Family History  Problem Relation Age of Onset   Prostate cancer Maternal Uncle 66       On radiation therapy now, diagnosed in around June 2012   Colon cancer Maternal Uncle    Heart attack Paternal Grandfather        Diet due to MI   Lung cancer Paternal Aunt    Brain cancer Paternal Aunt    Kidney disease Maternal Uncle    Hypertension Mother    CAD Father    Colon polyps Neg Hx    Diabetes Neg Hx    Gallbladder disease Neg Hx    Esophageal cancer Neg Hx    Social History   Socioeconomic History   Marital status: Single    Spouse name: Not on file   Number of children: 4   Years of education: Not on file   Highest education level: Not on file  Occupational History   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   Drug use: No    Frequency: 2.0 times per week    Comment: denies   Sexual activity: Yes    Partners: Female  Other Topics Concern   Not on file  Social History Narrative   Lives in [place] with his fiance.   Moved to [place] 11 years before. Used to live in [place] for 20 years before that.   Has 4 healthy kids from previous wife- separated now.   Is not working full-time since last 2 and half years- since early 2010.   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   Social Determinants of Health   Financial Resource Strain: Not on file  Food Insecurity: Not on file  Transportation Needs: Not on file  Physical Activity: Not on file  Stress: Not on file  Social Connections: Not on file    Review of Systems: Review of Systems   Respiratory:  Negative for shortness of breath.   Cardiovascular:  Negative for chest pain.    Objective:  Physical Exam:  Vitals:   01/26/21 0913  BP: (!) 145/95  Pulse: 87  Temp: 98.1 F (36.7 C)  TempSrc: Oral  SpO2: 99%  Weight: 134 lb 6.4 oz (61 kg)  Height: 5\' 5"  (1.651 m)    Physical Exam Constitutional:      Appearance: Normal appearance.  Cardiovascular:     Rate and Rhythm: Normal rate and regular rhythm.  Pulmonary:     Effort: Pulmonary effort is normal.     Breath sounds: Normal breath sounds.     Comments: Tracheostomy speaking valve  Neurological:     Mental Status: He is alert.     Assessment & Plan:   See Encounters Tab for problem based charting.

## 2021-01-27 LAB — LIPID PANEL
Chol/HDL Ratio: 3.9 ratio (ref 0.0–5.0)
Cholesterol, Total: 256 mg/dL — ABNORMAL HIGH (ref 100–199)
HDL: 65 mg/dL (ref >39)
LDL Chol Calc (NIH): 172 mg/dL — ABNORMAL HIGH (ref 0–99)
Triglycerides: 109 mg/dL (ref 0–149)
VLDL Cholesterol Cal: 19 mg/dL (ref 5–40)

## 2021-01-27 LAB — BMP8+ANION GAP
Anion Gap: 15 mmol/L (ref 10.0–18.0)
BUN/Creatinine Ratio: 12 (ref 10–24)
BUN: 12 mg/dL (ref 8–27)
CO2: 24 mmol/L (ref 20–29)
Calcium: 9.8 mg/dL (ref 8.6–10.2)
Chloride: 103 mmol/L (ref 96–106)
Creatinine, Ser: 1 mg/dL (ref 0.76–1.27)
Glucose: 88 mg/dL (ref 70–99)
Potassium: 4.3 mmol/L (ref 3.5–5.2)
Sodium: 142 mmol/L (ref 134–144)
eGFR: 85 mL/min/{1.73_m2} (ref >59)

## 2021-01-27 LAB — TSH: TSH: 12.8 u[IU]/mL — ABNORMAL HIGH (ref 0.450–4.500)

## 2021-01-29 ENCOUNTER — Encounter: Payer: Self-pay | Admitting: Internal Medicine

## 2021-01-29 NOTE — Assessment & Plan Note (Signed)
Repeating TSH today.

## 2021-01-29 NOTE — Assessment & Plan Note (Signed)
Crestor was increased at his last visit for LDL above goal. He is prescribed this for primary ASCVD prevention. Repeating lipid panel today.

## 2021-01-29 NOTE — Assessment & Plan Note (Addendum)
Blood pressure is elevated today in clinic but he again reports normal pressures at home. He has a BP cuff and checks his BP regularly. He has a know history of white coat HTN that we have documented with ambulatory BP monitoring. He is tolerating amlodipine-olmesartan well. However did experience a few days of transient dizziness and orthostasis while sick with COVID-19. He has not had an episode of dizziness in the past two weeks and orthostatic vitals today in clinic are normal. Plan to continue current regiment for now. Repeat BMP today. Patient instructed to check BP and call if he experiences more dizziness. Continue monitoring home BP.

## 2021-01-31 ENCOUNTER — Telehealth: Payer: Self-pay | Admitting: Hematology and Oncology

## 2021-01-31 NOTE — Telephone Encounter (Signed)
Scheduled per 10/31 pt request, pt has been called and confirmed appt

## 2021-02-07 ENCOUNTER — Telehealth: Payer: Self-pay | Admitting: Hematology and Oncology

## 2021-02-07 NOTE — Telephone Encounter (Signed)
Scheduled appointment per provider. Patient aware.  

## 2021-02-08 ENCOUNTER — Inpatient Hospital Stay: Payer: Medicaid Other | Admitting: Hematology and Oncology

## 2021-02-10 ENCOUNTER — Encounter: Payer: Self-pay | Admitting: Hematology and Oncology

## 2021-02-10 ENCOUNTER — Other Ambulatory Visit: Payer: Self-pay

## 2021-02-10 ENCOUNTER — Inpatient Hospital Stay: Payer: Medicaid Other | Attending: Hematology and Oncology | Admitting: Hematology and Oncology

## 2021-02-10 DIAGNOSIS — E032 Hypothyroidism due to medicaments and other exogenous substances: Secondary | ICD-10-CM | POA: Diagnosis not present

## 2021-02-10 DIAGNOSIS — C321 Malignant neoplasm of supraglottis: Secondary | ICD-10-CM | POA: Insufficient documentation

## 2021-02-10 DIAGNOSIS — E039 Hypothyroidism, unspecified: Secondary | ICD-10-CM | POA: Diagnosis not present

## 2021-02-10 MED ORDER — LEVOTHYROXINE SODIUM 125 MCG PO TABS: 125.0000 ug | ORAL_TABLET | Freq: Every day | ORAL | 1 refills | Status: DC

## 2021-02-10 NOTE — Progress Notes (Signed)
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   All questions were answered. The patient knows to call the clinic with any problems, questions or concerns. The total time spent in the appointment was 20 minutes encounter with patients including review of chart and various tests results, discussions about plan of care and coordination of care plan   Jonathan Lark, MD 02/10/2021 4:40 PM  INTERVAL HISTORY: Please see below for problem oriented charting. Jonathan [name] returns for follow-up for history of head and neck cancer Jonathan [name] is doing very well Jonathan [name] had recent procedure done at [facility] and is able to regain his ability to talk Jonathan [name] have no swallowing difficulties Jonathan [name] has no teeth but that does not bother him Jonathan [name] is able to eat enough to maintain his weight No recent alcohol or tobacco use No new lymphadenopathy in his neck No range of motion difficulties  REVIEW OF SYSTEMS:   Constitutional: Denies fevers, chills or abnormal weight loss Eyes: Denies blurriness of vision Ears, nose, mouth, throat, and face: Denies mucositis or sore throat Respiratory: Denies cough, dyspnea or wheezes Cardiovascular: Denies palpitation, chest discomfort or lower extremity  swelling Gastrointestinal:  Denies nausea, heartburn or change in bowel habits Skin: Denies abnormal skin rashes Lymphatics: Denies new lymphadenopathy or easy bruising Neurological:Denies numbness, tingling or new weaknesses Behavioral/Psych: Mood is stable, no new changes  All other systems were reviewed with the patient and are negative.  I have reviewed the past medical history, past surgical history, social history and family history with the patient and they are unchanged from previous note.  ALLERGIES:  is allergic to other.  MEDICATIONS:  Current Outpatient Medications  Medication Sig Dispense Refill   levothyroxine (SYNTHROID) 125 MCG tablet Take 1 tablet (125 mcg total) by mouth daily before breakfast. 90 tablet 1   acetaminophen (TYLENOL) 500 MG tablet Take 1,000 mg by mouth every 6 (six) hours as needed for moderate pain or headache.     amlodipine-olmesartan (AZOR) 10-20 MG tablet TAKE 1 TABLET BY MOUTH DAILY 30 tablet 2   Chlorpheniramine-PSE-Ibuprofen (ADVIL ALLERGY SINUS) 2-30-200 MG TABS Take 1 tablet by mouth daily as needed (allergies).     ibuprofen (ADVIL) 200 MG tablet Take 400 mg by mouth every 6 (six) hours as needed for headache or moderate pain.     omeprazole (PRILOSEC) 20 MG capsule Take 20 mg by mouth daily.     rosuvastatin (CRESTOR) 40 MG tablet Take 1 tablet (40 mg total) by mouth daily. 30 tablet 11   Wheat Dextrin (BENEFIBER) POWD Take 1 Dose by mouth daily.     No current facility-administered medications for this visit.    SUMMARY OF ONCOLOGIC HISTORY: Oncology History  Malignant neoplasm of supraglottis ([place])  04/17/2016 Imaging   Ct neck showed relatively large approximately 3.5 cm laryngeal mass  most compatible with laryngeal carcinoma. Epicenter at the glottis on the right. Extension across midline as well as involvement of laryngeal cartilages (possibly also bilateral). 2. Relatively small but malignant appearing right level 2 lymphadenopathy.  Suspicious contralateral left level 2 lymph nodes.   04/21/2016 Initial Diagnosis   Jonathan [name] saw Dr. Constance Holster in the clinic for chronic hoarseness. In the office, it was noted that the posterior soft palate, uvula, tongue base and vallecula were visualized and appeared healthy without mucosal masses or lesions. There is a large mass involving the right side of the larynx, the right cord is fixed. There may be involvement of the post cricoid area it is difficult to say for sure. 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 Clinically this is at least staged as T3. The airway is significantly impaired. Jonathan [name] is at risk for impending airway obstruction. ENT recommended him for awake tracheostomy with direct laryngoscopy and biopsy and esophagoscopy under anesthesia immediately following that.    04/24/2016 Pathology Results   Larynx, biopsy, Right Side - POSITIVE FOR INVASIVE SQUAMOUS CELL CARCINOMA. - SEE COMMENT. Microscopic Comment As sampled, the squamous cell carcinoma appears well differentiated. A fragment of benign cartilage is also present in the specimen.   04/24/2016 Surgery   She underwent direct laryngoscopy with biopsy. An anterior commissure scope was used to evaluate the larynx. A large mass and, using the right side of the larynx and the anterior commissure area was identified. Multiple biopsies were taken. The left cord was swollen but seemed to be uninvolved. Piriforms appeared to be clear. The post cricoid area appeared clear. Esophagoscopy was difficult and ENT surgeon was unable to pass the scope into the esophagus. Jonathan [name] had tracheostomy   04/24/2016 Pathology Results   Larynx, biopsy, Right Side - POSITIVE FOR INVASIVE SQUAMOUS CELL CARCINOMA. - SEE COMMENT. Microscopic Comment As sampled, the squamous cell carcinoma appears well differentiated. A fragment of benign cartilage is also present in the specimen.    05/15/2016 PET scan   Right eccentric  glottic mass, appears to cross the anterior commissure with destructive findings of the thyroid cartilage. There is an upper normal size right station 2A lymph node with maximum SUV of 4.2 which is only very faintly elevated over background, but which could possibly represent a small amount of local metastatic spread. 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 2. Asymmetric calcification along the head of the right caudate nucleus and adjacent lentiform nucleus. Well this can sometimes be physiologic, there is some accompanying reduction in PET activity, and the asymmetry is an unusual feature. Accordingly I recommend MRI of the brain with and without contrast for further characterization. 3. A AP window lymph node is minimally enlarged at 1.1 cm, but with a maximum SUV below background mediastinal levels, likely benign.   05/23/2016 Imaging   MRI brain: Atrophy with chronic microvascular ischemic change. No intracranial metastatic disease. 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     05/26/2016 Surgery   Multiple extraction of tooth numbers 2, 5, 6, 8-13, and  21-28. 2. 4 Quadrants of alveoloplasty. 3. Bilateral mandibular lingual tori reductions 4. Maxillary right, maxillary left, and mandibular left buccal exostoses reductions   06/02/2016 Procedure   Successful placement of a right internal jugular approach power injectable Port-A-Cath.  The catheter is ready for immediate use   06/09/2016 - 08/22/2016 Chemotherapy   Jonathan [name] received induction chemotherapy with taxotere, cisplatin and 5 FU x 4 cycles   07/31/2016 PET scan   Mild asymmetric soft tissue prominence along the right supraglottic larynx, without hypermetabolism, likely corresponding to treated tumor. No findings suspicious for  metastatic disease. Small mediastinal and left axillary nodes, without associated hypermetabolism.   09/11/2016 - 10/30/2016 Radiation Therapy   Jonathan [name] received radiation Radiation treatment dates:   09/11/16-10/30/16   Site/dose:   Larynx and b/l neck, 70 Gy in 35 fractions   Beams/energy:   IMRT, 6X     01/01/2017 Imaging   CT neck: Asymmetric soft tissue prominence in the right supraglottic region extending down to the glottis on the right. This appears similar to the previous PET scan at which time there was not any hypermetabolism in the region. Therefore, this could be chronic post treatment change. However, there does appear to be some enhancing tissue and I cannot exclude the possibility of residual or recurrent tumor on the basis of this exam. There is no evidence of any nodal disease.   05/04/2017 Procedure   Trachestomy was removed   06/19/2017 Imaging   MRI brain 1. Abnormal enhancement versus intrinsic density RIGHT basal ganglia, subacute infarct or metastasis possible. 2. More conspicuous 8 x 10 mm nodular enhancement RIGHT supraglottic fat at site of primary tumor associated with vocal cord paralysis. Recurrent tumor suspected. Recommend direct inspection. 3. Post radiation changes of the neck including sialoadenitis. Worsening supraglottic edema. 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  4. Interval tracheostomy tube removal.   06/21/2017 Imaging   MRI brain 1. No evidence of intracranial metastases or acute abnormality. 2. 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 Basal ganglia mineralization accounts for the density on CT.   06/22/2017 PET scan   1. There is continued soft tissue prominence along the right glottic and supraglottic region but without significant accentuated hypermetabolic activity to further suggest recurrence. No hypermetabolic adenopathy or other lesion to suggest active  malignancy at this time. 2. Stable appearance of hyperdensity along the right basal ganglia attributed to underlying developmental venous anomaly based on prior MRI workup.  3. Other imaging findings of potential clinical significance: Aortic Atherosclerosis (ICD10-I70.0). Coronary atherosclerosis.   07/06/2017 Surgery   Jonathan [name] had direct laryngoscopy and biopsy   07/11/2017 Pathology Results   Biopsy confirmed invasive squamous cell carcinoma   08/10/2017 Surgery   PROCEDURE:  Procedure(s): Dr. Constance Holster performed TOTAL LARYNGECTOMY     08/10/2017 Pathology Results   1. Pharynx, biopsy, right margin - BENIGN FIBROADIPOSE TISSUE. - THERE IS NO EVIDENCE OF MALIGNANCY. 2. Larynx, laryngectomy, Total - INVASIVE SQUAMOUS CELL CARCINOMA, WELL DIFFERENTIATED, HORIZONTALLY SPANNING 1.0 CM. - CARCINOMA INVADES 0.1 CM. - THE SURGICAL RESECTIONS ARE NEGATIVE FOR CARCINOMA. - SEE ONCOLOGY TABLE BELOW. Microscopic Comment 2. LARYNX (SUPRAGLOTTIS, GLOTTIS, SUBGLOTTIS): Procedure: Laryngectomy. Tumor Site: Right true cord with extension to midline. Tumor Laterality: Right. Tumor Focality: Unifocal. Tumor Size: 1.0 cm horizontally (gross measurement), invades 0.1 cm. Histologic Type: Squamous cell carcinoma. Histologic Grade: Well differentiated. Margins: Negative, closest margin is right lateral mucosal margin at 3.0 cm (gross measurement). Lymphovascular Invasion: Not identified. Perineural Invasion: Not identified. Regional Lymph Nodes: None examined. Pathologic Stage Classification (pTNM, AJCC 8th Edition): pT1a, pNX. Ancillary Studies: Can be performed upon clinician request. Representative Tumor Block: 2H.   04/16/2018 Procedure   Removal of implanted Port-A-Cath utilizing sharp  and blunt dissection. The procedure was uncomplicated     PHYSICAL EXAMINATION: ECOG PERFORMANCE STATUS: 1 - Symptomatic but completely ambulatory  Vitals:   02/10/21 1227  BP: 125/80  Pulse: 93  Resp: 18   Temp: 98.4 F (36.9 C)  SpO2: 100%   Filed Weights   02/10/21 1227  Weight: 139 lb 12.8 oz (63.4 kg)    GENERAL:alert, no distress and comfortable SKIN: skin color, texture, turgor are normal, no rashes or significant lesions EYES: normal, Conjunctiva are pink and non-injected, sclera clear OROPHARYNX:no exudate, no erythema and lips, buccal mucosa, and tongue normal  NECK: Tracheostomy in situ.  Slight neck fibrosis from prior radiation LYMPH:  no palpable lymphadenopathy in the cervical, axillary or inguinal LUNGS: clear to auscultation and percussion with normal breathing effort HEART: regular rate & rhythm and no murmurs and no lower extremity edema ABDOMEN:abdomen soft, non-tender and normal bowel sounds Musculoskeletal:no cyanosis of digits and no clubbing  NEURO: alert & oriented x 3 with fluent speech, no focal motor/sensory deficits  LABORATORY DATA:  I have reviewed the data as listed    Component Value Date/Time   NA 142 01/26/2021 1025   NA 141 01/04/2017 1432   K 4.3 01/26/2021 1025   K 4.1 01/04/2017 1432   CL 103 01/26/2021 1025   CO2 24 01/26/2021 1025   CO2 27 01/04/2017 1432   GLUCOSE 88 01/26/2021 1025   GLUCOSE 107 (H) 05/08/2019 1018   GLUCOSE 93 01/04/2017 1432   BUN 12 01/26/2021 1025   BUN 12.2 01/04/2017 1432   CREATININE 1.00 01/26/2021 1025   CREATININE 0.8 01/04/2017 1432   CALCIUM 9.8 01/26/2021 1025   CALCIUM 9.8 01/04/2017 1432   PROT 7.7 05/08/2019 1018   PROT 6.9 01/04/2017 1432   ALBUMIN 4.5 05/08/2019 1018   ALBUMIN 3.8 01/04/2017 1432   AST 18 05/08/2019 1018   AST 18 01/04/2017 1432   ALT 15 05/08/2019 1018   ALT 12 01/04/2017 1432   ALKPHOS 58 05/08/2019 1018   ALKPHOS 67 01/04/2017 1432   BILITOT 0.5 05/08/2019 1018   BILITOT 0.23 01/04/2017 1432   GFRNONAA 76 11/12/2019 1025   GFRNONAA 82 03/06/2011 0853   GFRAA 88 11/12/2019 1025   GFRAA >89 03/06/2011 0853    No results found for: SPEP, UPEP  Lab Results   Component Value Date   WBC 5.1 06/30/2020   NEUTROABS 5.9 05/08/2019   HGB 16.0 06/30/2020   HCT 49.0 06/30/2020   MCV 87 06/30/2020   PLT 374 06/30/2020      Chemistry      Component Value Date/Time   NA 142 01/26/2021 1025   NA 141 01/04/2017 1432   K 4.3 01/26/2021 1025   K 4.1 01/04/2017 1432   CL 103 01/26/2021 1025   CO2 24 01/26/2021 1025   CO2 27 01/04/2017 1432   BUN 12 01/26/2021 1025   BUN 12.2 01/04/2017 1432   CREATININE 1.00 01/26/2021 1025   CREATININE 0.8 01/04/2017 1432      Component Value Date/Time   CALCIUM 9.8 01/26/2021 1025   CALCIUM 9.8 01/04/2017 1432   ALKPHOS 58 05/08/2019 1018   ALKPHOS 67 01/04/2017 1432   AST 18 05/08/2019 1018   AST 18 01/04/2017 1432   ALT 15 05/08/2019 1018   ALT 12 01/04/2017 1432   BILITOT 0.5 05/08/2019 1018   BILITOT 0.23 01/04/2017 1432

## 2021-02-10 NOTE — Assessment & Plan Note (Signed)
Clinically, he has no signs of cancer recurrence The patient has quit smoking permanently He will continue follow-up with ENT I will see him back in 1 year for further follow-up

## 2021-02-10 NOTE — Assessment & Plan Note (Signed)
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

## 2021-03-03 DIAGNOSIS — Z430 Encounter for attention to tracheostomy: Secondary | ICD-10-CM | POA: Diagnosis not present

## 2021-04-21 DIAGNOSIS — C329 Malignant neoplasm of larynx, unspecified: Secondary | ICD-10-CM | POA: Diagnosis not present

## 2021-04-21 DIAGNOSIS — Z87891 Personal history of nicotine dependence: Secondary | ICD-10-CM | POA: Diagnosis not present

## 2021-04-21 DIAGNOSIS — L989 Disorder of the skin and subcutaneous tissue, unspecified: Secondary | ICD-10-CM | POA: Diagnosis not present

## 2021-04-29 DIAGNOSIS — Z430 Encounter for attention to tracheostomy: Secondary | ICD-10-CM | POA: Diagnosis not present

## 2021-05-05 DIAGNOSIS — Z430 Encounter for attention to tracheostomy: Secondary | ICD-10-CM | POA: Diagnosis not present

## 2021-05-08 ENCOUNTER — Other Ambulatory Visit: Payer: Self-pay | Admitting: Hematology and Oncology

## 2021-05-18 ENCOUNTER — Encounter: Payer: Self-pay | Admitting: Internal Medicine

## 2021-05-18 ENCOUNTER — Ambulatory Visit: Payer: Medicaid Other | Admitting: Internal Medicine

## 2021-05-18 ENCOUNTER — Other Ambulatory Visit: Payer: Self-pay

## 2021-05-18 VITALS — BP 133/83 | HR 92 | Temp 98.0°F | Ht 65.0 in | Wt 147.6 lb

## 2021-05-18 DIAGNOSIS — E032 Hypothyroidism due to medicaments and other exogenous substances: Secondary | ICD-10-CM | POA: Diagnosis not present

## 2021-05-18 DIAGNOSIS — E7841 Elevated Lipoprotein(a): Secondary | ICD-10-CM | POA: Diagnosis not present

## 2021-05-18 DIAGNOSIS — M25511 Pain in right shoulder: Secondary | ICD-10-CM | POA: Insufficient documentation

## 2021-05-18 DIAGNOSIS — T148XXA Other injury of unspecified body region, initial encounter: Secondary | ICD-10-CM

## 2021-05-18 DIAGNOSIS — I10 Essential (primary) hypertension: Secondary | ICD-10-CM | POA: Diagnosis not present

## 2021-05-18 MED ORDER — NAPROXEN 500 MG PO TABS: 500.0000 mg | ORAL_TABLET | Freq: Two times a day (BID) | ORAL | 0 refills | Status: DC

## 2021-05-18 MED ORDER — CYCLOBENZAPRINE HCL 5 MG PO TABS: 5.0000 mg | ORAL_TABLET | Freq: Three times a day (TID) | ORAL | 1 refills | Status: DC | PRN

## 2021-05-18 NOTE — Progress Notes (Signed)
Subjective:   Patient ID: Jonathan [name] male   DOB: 24-Mar-1961 61 y.o.   MRN: [id number]  HPI: [name] is a 61 y.o. male with past medical history outlined below here for follow up of HTN and hypothyroidism. For the details of today's visit, please refer to the assessment and plan.   Past Medical History:  Diagnosis Date   Allergic rhinitis    Arthritis    Bilateral knee arthritis for about 20 years. Not taking any medications, believes in nature processes   Colon polyp    Dental caries    prechemoradiation therapy, periodontitis   Elevated blood pressure reading    White Coat Hypertension   GERD (gastroesophageal reflux disease)    Headache    History of radiation therapy 09/11/16- 10/30/16   Larynx 70 Gy in 35 fractions   HLD (hyperlipidemia)    Hypothyroidism    radiation induced hypothyroidism   Internal hemorrhoids    Laryngeal cancer ([place])    Pneumonia    as a child   Tubular adenoma of colon    Current Outpatient Medications  Medication Sig Dispense Refill   acetaminophen (TYLENOL) 500 MG tablet Take 1,000 mg by mouth every 6 (six) hours as needed for moderate pain or headache.     amlodipine-olmesartan (AZOR) 10-20 MG tablet TAKE 1 TABLET BY MOUTH DAILY 30 tablet 2   Chlorpheniramine-PSE-Ibuprofen (ADVIL ALLERGY SINUS) 2-30-200 MG TABS Take 1 tablet by mouth daily as needed (allergies).     ibuprofen (ADVIL) 200 MG tablet Take 400 mg by mouth every 6 (six) hours as needed for headache or moderate pain.     levothyroxine (SYNTHROID) 125 MCG tablet TAKE 1 TABLET(125 MCG) BY MOUTH DAILY BEFORE AND BREAKFAST 90 tablet 1   omeprazole (PRILOSEC) 20 MG capsule Take 20 mg by mouth daily.     rosuvastatin (CRESTOR) 40 MG tablet Take 1 tablet (40 mg total) by mouth daily. 30 tablet 11   Wheat Dextrin (BENEFIBER) POWD Take 1 Dose by mouth daily.     No current facility-administered medications for this visit.   Family History  Problem Relation Age of Onset   Prostate  cancer Maternal Uncle 66       On radiation therapy now, diagnosed in around June 2012   Colon cancer Maternal Uncle    Heart attack Paternal Grandfather        Diet due to MI   Lung cancer Paternal Aunt    Brain cancer Paternal Aunt    Kidney disease Maternal Uncle    Hypertension Mother    CAD Father    Colon polyps Neg Hx    Diabetes Neg Hx    Gallbladder disease Neg Hx    Esophageal cancer Neg Hx    Social History   Socioeconomic History   Marital status: Single    Spouse name: Not on file   Number of children: 4   Years of education: Not on file   Highest education level: Not on file  Occupational History   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   Drug  use: No    Frequency: 2.0 times per week    Comment: denies   Sexual activity: Yes    Partners: Female  Other Topics Concern   Not on file  Social History Narrative   Lives in [place] with his fiance.   Moved to [place] 11 years before. Used to live in [place] for 20 years before that.   Has 4 healthy kids from previous wife- separated now.   Is not working full-time since last 2 and half years- since early 2010.   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   Social Determinants of Health   Financial Resource Strain: Not on file  Food Insecurity: Not on file  Transportation Needs: Not on file  Physical Activity: Not on file  Stress: Not on file  Social Connections: Not on file    Objective:  Physical Exam:  Vitals:   05/18/21 1059  BP: 133/83  Pulse: 92  Temp: 98 F (36.7 C)  TempSrc: Oral  SpO2: 98%  Weight: 147 lb 9.6 oz (67 kg)   Height: 5\' 5"  (1.651 m)    Constitutional: NAD Cardiovascular: RRR Pulmonary/Chest: Clear bilaterally, normal effort MSK: Pain to palpation over his right trapezius muscle.  No pain with manipulation of his right shoulder and normal range of motion.  No pain over his cervical spine  Assessment & Plan:   HLD (hyperlipidemia) LDL was above goal at his last visit however he reports inconsistent compliance with his Crestor at that time.  He has since been taking it daily and reports no side effects.  Rechecking lipid panel today.   HTN (hypertension) Chronic and well-controlled on amlodipine-olmesartan 10-20 mg daily.  He has been monitoring his blood pressure at home and reports consistent systolic readings in the 182X.  Denies any episodes of hypotension.  Labs checked at his last visit showed normal renal function.  Plan to continue current regimen.   Hypothyroidism, iatrogenic Synthroid was recently increased to 125 mcg at a recent visit with his oncologist.  He reports some weight gain which he is happy about but denies any other symptoms of hyper or hypothyroidism.  Rechecking TSH today.  Muscle strain Patient reports 2-3 months of right shoulder and upper back pain, worse at night while sleeping.  On exam he has no pain with manipulation of his shoulder joint and normal range of motion.  He has pain to palpation over his right trapezius muscle.  No pain over his cervical spine and he denies radicular symptoms. Suspect this is trapezius muscle strain. He has been taking ibuprofen but only once a day. Recommended naproxen 500 mg BID with meals x 2 weeks and low dose flexeril PRN. If symptoms do not improve, consider physical therapy or sports medicine referral.

## 2021-05-18 NOTE — Assessment & Plan Note (Signed)
Patient reports 2-3 months of right shoulder and upper back pain, worse at night while sleeping.  On exam he has no pain with manipulation of his shoulder joint and normal range of motion.  He has pain to palpation over his right trapezius muscle.  No pain over his cervical spine and he denies radicular symptoms. Suspect this is trapezius muscle strain. He has been taking ibuprofen but only once a day. Recommended naproxen 500 mg BID with meals x 2 weeks and low dose flexeril PRN. If symptoms do not improve, consider physical therapy or sports medicine referral.

## 2021-05-18 NOTE — Assessment & Plan Note (Signed)
Chronic and well-controlled on amlodipine-olmesartan 10-20 mg daily.  He has been monitoring his blood pressure at home and reports consistent systolic readings in the 212Y.  Denies any episodes of hypotension.  Labs checked at his last visit showed normal renal function.  Plan to continue current regimen.

## 2021-05-18 NOTE — Patient Instructions (Signed)
[name],  It was a pleasure to see you today. I will call you with the result of your blood work.   If you have any questions or concerns, call our clinic at [phone] or after hours call [phone] and ask for the internal medicine resident on call.   Thank you!  Dr. Darnell Level

## 2021-05-18 NOTE — Assessment & Plan Note (Signed)
LDL was above goal at his last visit however he reports inconsistent compliance with his Crestor at that time.  He has since been taking it daily and reports no side effects.  Rechecking lipid panel today.

## 2021-05-18 NOTE — Assessment & Plan Note (Signed)
Synthroid was recently increased to 125 mcg at a recent visit with his oncologist.  He reports some weight gain which he is happy about but denies any other symptoms of hyper or hypothyroidism.  Rechecking TSH today.

## 2021-05-19 LAB — LIPID PANEL
Chol/HDL Ratio: 3 ratio (ref 0.0–5.0)
Cholesterol, Total: 140 mg/dL (ref 100–199)
HDL: 46 mg/dL (ref >39)
LDL Chol Calc (NIH): 52 mg/dL (ref 0–99)
Triglycerides: 269 mg/dL — ABNORMAL HIGH (ref 0–149)
VLDL Cholesterol Cal: 42 mg/dL — ABNORMAL HIGH (ref 5–40)

## 2021-05-19 LAB — TSH: TSH: 0.056 u[IU]/mL — ABNORMAL LOW (ref 0.450–4.500)

## 2021-05-20 MED ORDER — LEVOTHYROXINE SODIUM 112 MCG PO TABS: 112.0000 ug | ORAL_TABLET | Freq: Every day | ORAL | 0 refills | Status: DC

## 2021-05-20 NOTE — Addendum Note (Signed)
Addended by: Jodean Lima on: 05/20/2021 12:40 PM   Modules accepted: Orders

## 2021-05-31 ENCOUNTER — Other Ambulatory Visit: Payer: Self-pay | Admitting: Internal Medicine

## 2021-05-31 DIAGNOSIS — E032 Hypothyroidism due to medicaments and other exogenous substances: Secondary | ICD-10-CM

## 2021-06-01 ENCOUNTER — Other Ambulatory Visit: Payer: Self-pay | Admitting: Internal Medicine

## 2021-06-01 DIAGNOSIS — T148XXA Other injury of unspecified body region, initial encounter: Secondary | ICD-10-CM

## 2021-06-14 ENCOUNTER — Other Ambulatory Visit: Payer: Self-pay | Admitting: Internal Medicine

## 2021-06-14 DIAGNOSIS — T148XXA Other injury of unspecified body region, initial encounter: Secondary | ICD-10-CM

## 2021-07-14 ENCOUNTER — Other Ambulatory Visit: Payer: Self-pay | Admitting: Internal Medicine

## 2021-07-14 DIAGNOSIS — T148XXA Other injury of unspecified body region, initial encounter: Secondary | ICD-10-CM

## 2021-07-14 DIAGNOSIS — I10 Essential (primary) hypertension: Secondary | ICD-10-CM

## 2021-07-19 ENCOUNTER — Other Ambulatory Visit: Payer: Self-pay | Admitting: Internal Medicine

## 2021-07-19 DIAGNOSIS — I10 Essential (primary) hypertension: Secondary | ICD-10-CM

## 2021-08-02 DIAGNOSIS — Z430 Encounter for attention to tracheostomy: Secondary | ICD-10-CM | POA: Diagnosis not present

## 2021-08-17 ENCOUNTER — Encounter: Payer: Self-pay | Admitting: Internal Medicine

## 2021-08-17 ENCOUNTER — Other Ambulatory Visit: Payer: Self-pay

## 2021-08-17 ENCOUNTER — Ambulatory Visit: Payer: Medicaid Other | Admitting: Internal Medicine

## 2021-08-17 ENCOUNTER — Ambulatory Visit (HOSPITAL_COMMUNITY)
Admission: RE | Admit: 2021-08-17 | Discharge: 2021-08-17 | Disposition: A | Discharge status: Home / Self Care | Payer: Medicaid Other | Source: Ambulatory Visit | Attending: Internal Medicine | Admitting: Internal Medicine

## 2021-08-17 VITALS — BP 142/87 | HR 88 | Temp 98.8°F | Ht 65.0 in | Wt 145.4 lb

## 2021-08-17 DIAGNOSIS — Z87891 Personal history of nicotine dependence: Secondary | ICD-10-CM

## 2021-08-17 DIAGNOSIS — M25511 Pain in right shoulder: Secondary | ICD-10-CM | POA: Diagnosis not present

## 2021-08-17 DIAGNOSIS — I10 Essential (primary) hypertension: Secondary | ICD-10-CM

## 2021-08-17 DIAGNOSIS — M2578 Osteophyte, vertebrae: Secondary | ICD-10-CM | POA: Diagnosis not present

## 2021-08-17 DIAGNOSIS — M5412 Radiculopathy, cervical region: Secondary | ICD-10-CM | POA: Insufficient documentation

## 2021-08-17 DIAGNOSIS — G8929 Other chronic pain: Secondary | ICD-10-CM | POA: Insufficient documentation

## 2021-08-17 DIAGNOSIS — M170 Bilateral primary osteoarthritis of knee: Secondary | ICD-10-CM

## 2021-08-17 DIAGNOSIS — E032 Hypothyroidism due to medicaments and other exogenous substances: Secondary | ICD-10-CM | POA: Diagnosis not present

## 2021-08-17 DIAGNOSIS — M47812 Spondylosis without myelopathy or radiculopathy, cervical region: Secondary | ICD-10-CM | POA: Diagnosis not present

## 2021-08-17 MED ORDER — METHOCARBAMOL 750 MG PO TABS: 1500.0000 mg | ORAL_TABLET | Freq: Three times a day (TID) | ORAL | 1 refills | Status: DC | PRN

## 2021-08-17 NOTE — Assessment & Plan Note (Signed)
Patient has a history of hypertension well-controlled on amlodipine-olmesartan 10-20 mg daily.  Pressure is mildly elevated today however he has known whitecoat hypertension and regularly checks his blood pressure at home.  Reports blood pressure has been at goal.  No changes today.  Continue to monitor. ?

## 2021-08-17 NOTE — Assessment & Plan Note (Signed)
Patient has chronic knee pain secondary to bilateral osteoarthritis.  Symptoms are well controlled with intermittent intra-articular steroid injections.  His last injection was over 1 year ago.  He reports no pain in his right knee but left knee pain has returned and he would like to repeat injection.  We discussed the natural progression of OA and that he may eventually require knee replacement surgery.  However currently doing very well with intermittent steroid injections.  We will continue this. ? ?Procedure Note ?  ?Indication:  Left knee osteoarthritis   ?  ?Operators: Drs. Verdell Kincannon ?  ?The patient was provided with risks, benefits, and alternatives to intraarticular injection. He consented to intraarticular knee injection for left knee pain 2/2 to OA.  After a time out was preformed, the knee was prepped in a sterile fashion. Cold spray was applied to the skin over the insertion site (lateral inferior patellar aspect of the knee while in the seated position). A mixture of 2 cc 1% lidocaine and 40 mg of Kenalog was injected into the knee using a 25 gauge 1 and 1/2 inch needle.  The left knee space was entered without difficulty, entered successfully after the first attempt. The patient tolerated the procedures well without complication.  ? ? ? ?

## 2021-08-17 NOTE — Progress Notes (Signed)
?Subjective:  ? ?Patient ID: Jonathan [name] male   DOB: Dec 21, 1960 61 y.o.   MRN: [id number] ? ?HPI: ?[name] is a 61 y.o. male with past medical history outlined below here for follow up of HTN and hypothyroidism. For the details of today's visit, please refer to the assessment and plan. ? ? ?Past Medical History:  ?Diagnosis Date  ? Allergic rhinitis   ? Arthritis   ? Bilateral knee arthritis for about 20 years. Not taking any medications, believes in nature processes  ? Colon polyp   ? Dental caries   ? prechemoradiation therapy, periodontitis  ? Elevated blood pressure reading   ? White Coat Hypertension  ? GERD (gastroesophageal reflux disease)   ? Headache   ? History of radiation therapy 09/11/16- 10/30/16  ? Larynx 70 Gy in 35 fractions  ? HLD (hyperlipidemia)   ? Hypothyroidism   ? radiation induced hypothyroidism  ? Internal hemorrhoids   ? Laryngeal cancer ([place])   ? Pneumonia   ? as a child  ? Tubular adenoma of colon   ? ?Current Outpatient Medications  ?Medication Sig Dispense Refill  ? methocarbamol (ROBAXIN) 750 MG tablet Take 2 tablets (1,500 mg total) by mouth every 8 (eight) hours as needed for muscle spasms. 60 tablet 1  ? acetaminophen (TYLENOL) 500 MG tablet Take 1,000 mg by mouth every 6 (six) hours as needed for moderate pain or headache.    ? amlodipine-olmesartan (AZOR) 10-20 MG tablet TAKE 1 TABLET BY MOUTH DAILY 90 tablet 3  ? Chlorpheniramine-PSE-Ibuprofen (ADVIL ALLERGY SINUS) 2-30-200 MG TABS Take 1 tablet by mouth daily as needed (allergies).    ? ibuprofen (ADVIL) 200 MG tablet Take 400 mg by mouth every 6 (six) hours as needed for headache or moderate pain.    ? levothyroxine (SYNTHROID) 112 MCG tablet Take 1 tablet (112 mcg total) by mouth daily. 90 tablet 0  ? naproxen (NAPROSYN) 500 MG tablet TAKE 1 TABLET(500 MG) BY MOUTH TWICE DAILY WITH A MEAL 30 tablet 2  ? omeprazole (PRILOSEC) 20 MG capsule Take 20 mg by mouth daily.    ? rosuvastatin (CRESTOR) 40 MG tablet Take 1 tablet  (40 mg total) by mouth daily. 30 tablet 11  ? Wheat Dextrin (BENEFIBER) POWD Take 1 Dose by mouth daily.    ? ?No current facility-administered medications for this visit.  ? ?Family History  ?Problem Relation Age of Onset  ? Prostate cancer Maternal Uncle 66  ?     On radiation therapy now, diagnosed in around June 2012  ? Colon cancer Maternal Uncle   ? Heart attack Paternal Grandfather   ?     Diet due to MI  ? Lung cancer Paternal Aunt   ? Brain cancer Paternal Aunt   ? Kidney disease Maternal Uncle   ? Hypertension Mother   ? CAD Father   ? Colon polyps Neg Hx   ? Diabetes Neg Hx   ? Gallbladder disease Neg Hx   ? Esophageal cancer Neg Hx   ? ?Social History  ? ?Socioeconomic History  ? Marital status: Single  ?  Spouse name: Not on file  ? Number of children: 4  ? Years of education: Not on file  ? Highest education level: Not on file  ?Occupational History  ? 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 Smoking status: Former  ?  Packs/day: 0.50  ?  Years: 30.00  ?  Pack years: 15.00  ?  Types:  Cigarettes  ?  Quit date: 12/31/2015  ?  Years since quitting: 5.6  ? Smokeless tobacco: Never  ?Vaping Use  ? Vaping Use: Never used  ?Substance and Sexual Activity  ? Alcohol use: Yes  ?  Comment: Beer sometimes.  ? Drug use: No  ?  Frequency: 2.0 times per week  ?  Comment: denies  ? Sexual activity: Yes  ?  Partners: Female  ?Other Topics Concern  ? Not on file  ?Social History Narrative  ? Lives in [place] with his fianc?e.  ? Moved to [place] 11 years before. Used to live in [place] for 20 years before that.  ? Has 4 healthy kids from previous wife- separated now.  ? Is not working full-time since last 2 and half years- since early 2010.  ? Manages with some odd jobs currently.  ? 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  ? ?Social Determinants of Health  ? ?Financial Resource Strain: Not on file  ?Food Insecurity: Not on file  ?Transportation Needs: Not on file  ?Physical Activity: Not on file   ?Stress: Not on file  ?Social Connections: Not on file  ? ? ?Objective:  ?Physical Exam: ? ?Vitals:  ? 08/17/21 0903  ?BP: (!) 142/87  ?Pulse: 88  ?Temp: 98.8 ?F (37.1 ?C)  ?TempSrc: Oral  ?SpO2: 99%  ?Weight: 145 lb 6.4 oz (66 kg)  ?Height: '5\' 5"'$  (1.651 m)  ? ? ?Constitutional: NAD ?Cardiovascular: RRR ?Pulmonary/Chest: Clear bilaterally, normal effort ?MSK: Right shoulder ROM normal; +empty can test. No cervical spine tenderness. Bilateral upper extremity strength 5/5. Normal grip strength.  ? ?Assessment & Plan:  ? ?HTN (hypertension) ?Patient has a history of hypertension well-controlled on amlodipine-olmesartan 10-20 mg daily.  Pressure is mildly elevated today however he has known whitecoat hypertension and regularly checks his blood pressure at home.  Reports blood pressure has been at goal.  No changes today.  Continue to monitor. ? ?Hypothyroidism, iatrogenic ?Rechecking TSH.  Reports he is doing well on Synthroid 112 mcg daily. ? ?Cervical radiculopathy ?Patient has had persistent right shoulder/neck pain with radiculopathy now for 6 months.  He has failed conservative management with NSAIDs and Flexeril.  Reports some relief with these medications however pain always returns.  He reports pain over his right scapula and right shoulder with radicular symptoms.  He denies weakness and distal and proximal strength is intact today on exam.  Shoulder range of motion is intact but he does have pain elicited with the empty can test.  Given persistence of symptoms, will proceed with plain films.  I suspect this is mostly cervical radiculopathy but given his lack of neck pain patient is concerned about his shoulder.  We will also obtain right shoulder imaging.  If there is evidence of OA, could consider consider glenohumeral joint injection.  ?-- Stop Flexeril, start trial of Robaxin 1500 mg every 8 hours as needed ?- Continue naproxen PRN ?- Follow-up cervical spine and right shoulder x-rays ? ?Bilateral primary  osteoarthritis of knee ?Patient has chronic knee pain secondary to bilateral osteoarthritis.  Symptoms are well controlled with intermittent intra-articular steroid injections.  His last injection was over 1 year ago.  He reports no pain in his right knee but left knee pain has returned and he would like to repeat injection.  We discussed the natural progression of OA and that he may eventually require knee replacement surgery.  However currently doing very well with intermittent steroid injections.  We will continue this. ? ?  Procedure Note ?  ?Indication:  Left knee osteoarthritis   ?  ?Operators: Drs. Travonte Byard ?  ?The patient was provided with risks, benefits, and alternatives to intraarticular injection. He consented to intraarticular knee injection for left knee pain 2/2 to OA.  After a time out was preformed, the knee was prepped in a sterile fashion. Cold spray was applied to the skin over the insertion site (lateral inferior patellar aspect of the knee while in the seated position). A mixture of 2 cc 1% lidocaine and 40 mg of Kenalog was injected into the knee using a 25 gauge 1 and 1/2 inch needle.  The left knee space was entered without difficulty, entered successfully after the first attempt. The patient tolerated the procedures well without complication.  ? ? ?  ?

## 2021-08-17 NOTE — Patient Instructions (Addendum)
Mr. Muska,  ? ?It was a pleasure to see you. I will call you with the results of your blood work and xrays.  ? ?Dr. Darnell Level  ?

## 2021-08-17 NOTE — Assessment & Plan Note (Signed)
Rechecking TSH.  Reports he is doing well on Synthroid 112 mcg daily. ?

## 2021-08-17 NOTE — Assessment & Plan Note (Signed)
Patient has had persistent right shoulder/neck pain with radiculopathy now for 6 months.  He has failed conservative management with NSAIDs and Flexeril.  Reports some relief with these medications however pain always returns.  He reports pain over his right scapula and right shoulder with radicular symptoms.  He denies weakness and distal and proximal strength is intact today on exam.  Shoulder range of motion is intact but he does have pain elicited with the empty can test.  Given persistence of symptoms, will proceed with plain films.  I suspect this is mostly cervical radiculopathy but given his lack of neck pain patient is concerned about his shoulder.  We will also obtain right shoulder imaging.  If there is evidence of OA, could consider consider glenohumeral joint injection.  ?-- Stop Flexeril, start trial of Robaxin 1500 mg every 8 hours as needed ?- Continue naproxen PRN ?- Follow-up cervical spine and right shoulder x-rays ?

## 2021-08-18 ENCOUNTER — Other Ambulatory Visit: Payer: Self-pay | Admitting: Internal Medicine

## 2021-08-18 DIAGNOSIS — M5412 Radiculopathy, cervical region: Secondary | ICD-10-CM

## 2021-08-18 LAB — TSH: TSH: 0.495 u[IU]/mL (ref 0.450–4.500)

## 2021-09-15 DIAGNOSIS — Z430 Encounter for attention to tracheostomy: Secondary | ICD-10-CM | POA: Diagnosis not present

## 2021-10-18 ENCOUNTER — Other Ambulatory Visit: Payer: Self-pay | Admitting: Internal Medicine

## 2021-10-18 DIAGNOSIS — E785 Hyperlipidemia, unspecified: Secondary | ICD-10-CM

## 2021-10-19 ENCOUNTER — Ambulatory Visit (HOSPITAL_COMMUNITY): Admission: RE | Admit: 2021-10-19 | Payer: Medicaid Other | Source: Ambulatory Visit

## 2021-10-28 DIAGNOSIS — Z430 Encounter for attention to tracheostomy: Secondary | ICD-10-CM | POA: Diagnosis not present

## 2021-11-16 DIAGNOSIS — R633 Feeding difficulties, unspecified: Secondary | ICD-10-CM | POA: Diagnosis not present

## 2021-11-16 DIAGNOSIS — R1314 Dysphagia, pharyngoesophageal phase: Secondary | ICD-10-CM | POA: Diagnosis not present

## 2021-12-12 DIAGNOSIS — Z430 Encounter for attention to tracheostomy: Secondary | ICD-10-CM | POA: Diagnosis not present

## 2022-01-20 DIAGNOSIS — Z430 Encounter for attention to tracheostomy: Secondary | ICD-10-CM | POA: Diagnosis not present

## 2022-02-09 ENCOUNTER — Other Ambulatory Visit: Payer: Medicaid Other

## 2022-02-09 ENCOUNTER — Ambulatory Visit: Payer: Medicaid Other | Admitting: Hematology and Oncology

## 2022-02-09 ENCOUNTER — Other Ambulatory Visit: Payer: Self-pay | Admitting: Hematology and Oncology

## 2022-02-09 DIAGNOSIS — E032 Hypothyroidism due to medicaments and other exogenous substances: Secondary | ICD-10-CM

## 2022-02-09 DIAGNOSIS — C321 Malignant neoplasm of supraglottis: Secondary | ICD-10-CM

## 2022-03-23 DIAGNOSIS — Z430 Encounter for attention to tracheostomy: Secondary | ICD-10-CM | POA: Diagnosis not present

## 2022-04-14 ENCOUNTER — Other Ambulatory Visit: Payer: Self-pay | Admitting: Internal Medicine

## 2022-04-14 DIAGNOSIS — E032 Hypothyroidism due to medicaments and other exogenous substances: Secondary | ICD-10-CM

## 2022-04-21 DIAGNOSIS — Z430 Encounter for attention to tracheostomy: Secondary | ICD-10-CM | POA: Diagnosis not present

## 2022-05-02 DIAGNOSIS — Z430 Encounter for attention to tracheostomy: Secondary | ICD-10-CM | POA: Diagnosis not present

## 2022-05-24 ENCOUNTER — Ambulatory Visit (INDEPENDENT_AMBULATORY_CARE_PROVIDER_SITE_OTHER): Payer: Medicaid Other | Admitting: Internal Medicine

## 2022-05-24 ENCOUNTER — Other Ambulatory Visit: Payer: Self-pay

## 2022-05-24 ENCOUNTER — Encounter: Payer: Self-pay | Admitting: Internal Medicine

## 2022-05-24 VITALS — BP 151/91 | HR 93 | Temp 98.0°F | Ht 65.0 in | Wt 133.2 lb

## 2022-05-24 DIAGNOSIS — M170 Bilateral primary osteoarthritis of knee: Secondary | ICD-10-CM | POA: Diagnosis not present

## 2022-05-24 DIAGNOSIS — Z87891 Personal history of nicotine dependence: Secondary | ICD-10-CM

## 2022-05-24 DIAGNOSIS — I10 Essential (primary) hypertension: Secondary | ICD-10-CM | POA: Diagnosis not present

## 2022-05-24 DIAGNOSIS — E032 Hypothyroidism due to medicaments and other exogenous substances: Secondary | ICD-10-CM | POA: Diagnosis not present

## 2022-05-24 DIAGNOSIS — C321 Malignant neoplasm of supraglottis: Secondary | ICD-10-CM

## 2022-05-24 NOTE — Patient Instructions (Signed)
Jonathan [name],  It was a pleasure to see you as always. I will call you with your lab results.  Take care!  Dr. Darnell Level

## 2022-05-24 NOTE — Assessment & Plan Note (Signed)
Currently doing well with very minimal pain. Has an excellent response to intra articular knee injections which we do 1-2 times a year. His last injection was in the left knee 9 months ago. Follow up as needed.

## 2022-05-24 NOTE — Assessment & Plan Note (Signed)
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 Currently taking synthroid 112 mcg daily. Last TSH WNL. Rechecking today.

## 2022-05-24 NOTE — Progress Notes (Signed)
Subjective:   Patient ID: Jonathan [name] male   DOB: July 07, 1960 62 y.o.   MRN: [id number]  HPI: [name] is a 62 y.o. male with past medical history outlined below here for follow up of HTN and hypothyroidism. For further details of today's visit, please refer to the assessment and plan below.   Past Medical History:  Diagnosis Date   Allergic rhinitis    Arthritis    Bilateral knee arthritis for about 20 years. Not taking any medications, believes in nature processes   Colon polyp    Dental caries    prechemoradiation therapy, periodontitis   Elevated blood pressure reading    White Coat Hypertension   GERD (gastroesophageal reflux disease)    Headache    History of radiation therapy 09/11/16- 10/30/16   Larynx 70 Gy in 35 fractions   HLD (hyperlipidemia)    Hypothyroidism    radiation induced hypothyroidism   Internal hemorrhoids    Laryngeal cancer ([place])    Pneumonia    as a child   Tubular adenoma of colon    Current Outpatient Medications  Medication Sig Dispense Refill   acetaminophen (TYLENOL) 500 MG tablet Take 1,000 mg by mouth every 6 (six) hours as needed for moderate pain or headache.     amlodipine-olmesartan (AZOR) 10-20 MG tablet TAKE 1 TABLET BY MOUTH DAILY 90 tablet 3   Chlorpheniramine-PSE-Ibuprofen (ADVIL ALLERGY SINUS) 2-30-200 MG TABS Take 1 tablet by mouth daily as needed (allergies).     ibuprofen (ADVIL) 200 MG tablet Take 400 mg by mouth every 6 (six) hours as needed for headache or moderate pain.     levothyroxine (SYNTHROID) 112 MCG tablet TAKE 1 TABLET(112 MCG) BY MOUTH DAILY 90 tablet 1   methocarbamol (ROBAXIN) 750 MG tablet Take 2 tablets (1,500 mg total) by mouth every 8 (eight) hours as needed for muscle spasms. 60 tablet 1   naproxen (NAPROSYN) 500 MG tablet TAKE 1 TABLET(500 MG) BY MOUTH TWICE DAILY WITH A MEAL 30 tablet 2   omeprazole (PRILOSEC) 20 MG capsule Take 20 mg by mouth daily.     rosuvastatin (CRESTOR) 40 MG tablet TAKE 1  TABLET(40 MG) BY MOUTH DAILY 30 tablet 11   Wheat Dextrin (BENEFIBER) POWD Take 1 Dose by mouth daily.     No current facility-administered medications for this visit.   Family History  Problem Relation Age of Onset   Prostate cancer Maternal Uncle 66       On radiation therapy now, diagnosed in around June 2012   Colon cancer Maternal Uncle    Heart attack Paternal Grandfather        Diet due to MI   Lung cancer Paternal Aunt    Brain cancer Paternal Aunt    Kidney disease Maternal Uncle    Hypertension Mother    CAD Father    Colon polyps Neg Hx    Diabetes Neg Hx    Gallbladder disease Neg Hx    Esophageal cancer Neg Hx    Social History   Socioeconomic History   Marital status: Single    Spouse name: Not on file   Number of children: 4   Years of education: Not on file   Highest education level: Not on file  Occupational History   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   Drug use: No    Frequency: 2.0 times per week    Comment: denies   Sexual activity: Yes    Partners: Female  Other Topics Concern   Not on file  Social History Narrative   Lives in [place] with his fiance.   Moved to [place] 11 years before. Used to live in [place] for 20 years before that.   Has 4 healthy kids from previous wife- separated now.   Is not working full-time since last 2 and half years- since early 2010.   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   Social Determinants of Health   Financial Resource Strain: Not on file  Food Insecurity: Not on file  Transportation Needs: Not on file  Physical Activity: Not on  file  Stress: Not on file  Social Connections: Not on file     Objective:  Physical Exam:  Vitals:   05/24/22 0933 05/24/22 1003  BP: (!) 165/91 (!) 151/91  Pulse: (!) 102 93  Temp: 98 F (36.7 C)   TempSrc: Oral   SpO2: 98%   Weight: 133 lb 3.2 oz (60.4 kg)   Height: 5' 5"$  (1.651 m)     Constitutional: Appears well, NAD HEENT: Tracheostomy speaking valve, stoma is clean  Cardiovascular: RRR, no m/r/g Pulmonary/Chest: Clear bilaterally, normal effort Extremities: Warm, no edema    Assessment & Plan:   White coat syndrome with diagnosis of hypertension Patient has both primary hypertension with a degree of white coat hypertension. He checks his BP daily at home and reports readings consistently < 120. He is elevated here today however I do believe he is adequately controlled at home. He is taking amlodipine-olmesartan 10-20 mg daily. Checking BMP today.   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 Currently taking synthroid 112 mcg daily. Last TSH WNL. Rechecking today.   Bilateral primary osteoarthritis of knee Currently doing well with very minimal pain. Has an excellent response to intra articular knee injections which we do 1-2 times a year. His last injection was in the left knee 9 months ago. Follow up as needed.

## 2022-05-24 NOTE — Assessment & Plan Note (Signed)
Patient has both primary hypertension with a degree of white coat hypertension. He checks his BP daily at home and reports readings consistently < 120. He is elevated here today however I do believe he is adequately controlled at home. He is taking amlodipine-olmesartan 10-20 mg daily. Checking BMP today.

## 2022-05-25 LAB — CMP14 + ANION GAP
ALT: 13 IU/L (ref 0–44)
AST: 29 IU/L (ref 0–40)
Albumin/Globulin Ratio: 1.7 (ref 1.2–2.2)
Albumin: 5 g/dL — ABNORMAL HIGH (ref 3.9–4.9)
Alkaline Phosphatase: 86 IU/L (ref 44–121)
Anion Gap: 20 mmol/L — ABNORMAL HIGH (ref 10.0–18.0)
BUN/Creatinine Ratio: 11 (ref 10–24)
BUN: 11 mg/dL (ref 8–27)
Bilirubin Total: 0.3 mg/dL (ref 0.0–1.2)
CO2: 20 mmol/L (ref 20–29)
Calcium: 9.7 mg/dL (ref 8.6–10.2)
Chloride: 105 mmol/L (ref 96–106)
Creatinine, Ser: 1.02 mg/dL (ref 0.76–1.27)
Globulin, Total: 2.9 g/dL (ref 1.5–4.5)
Glucose: 81 mg/dL (ref 70–99)
Potassium: 4.5 mmol/L (ref 3.5–5.2)
Sodium: 145 mmol/L — ABNORMAL HIGH (ref 134–144)
Total Protein: 7.9 g/dL (ref 6.0–8.5)
eGFR: 82 mL/min/{1.73_m2} (ref >59)

## 2022-05-25 LAB — CBC WITH DIFFERENTIAL/PLATELET
Basophils Absolute: 0.1 10*3/uL (ref 0.0–0.2)
Basos: 2 %
EOS (ABSOLUTE): 0.1 10*3/uL (ref 0.0–0.4)
Eos: 2 %
Hematocrit: 43.7 % (ref 37.5–51.0)
Hemoglobin: 15 g/dL (ref 13.0–17.7)
Immature Grans (Abs): 0 10*3/uL (ref 0.0–0.1)
Immature Granulocytes: 0 %
Lymphocytes Absolute: 1 10*3/uL (ref 0.7–3.1)
Lymphs: 22 %
MCH: 31.4 pg (ref 26.6–33.0)
MCHC: 34.3 g/dL (ref 31.5–35.7)
MCV: 91 fL (ref 79–97)
Monocytes Absolute: 0.5 10*3/uL (ref 0.1–0.9)
Monocytes: 12 %
Neutrophils Absolute: 2.7 10*3/uL (ref 1.4–7.0)
Neutrophils: 62 %
Platelets: 331 10*3/uL (ref 150–450)
RBC: 4.78 x10E6/uL (ref 4.14–5.80)
RDW: 14.4 % (ref 11.6–15.4)
WBC: 4.3 10*3/uL (ref 3.4–10.8)

## 2022-05-25 LAB — TSH: TSH: 0.654 u[IU]/mL (ref 0.450–4.500)

## 2022-06-21 DIAGNOSIS — Z430 Encounter for attention to tracheostomy: Secondary | ICD-10-CM | POA: Diagnosis not present

## 2022-07-16 ENCOUNTER — Other Ambulatory Visit: Payer: Self-pay | Admitting: Internal Medicine

## 2022-07-16 DIAGNOSIS — I10 Essential (primary) hypertension: Secondary | ICD-10-CM

## 2022-07-24 DIAGNOSIS — Z430 Encounter for attention to tracheostomy: Secondary | ICD-10-CM | POA: Diagnosis not present

## 2022-07-27 ENCOUNTER — Other Ambulatory Visit: Payer: Self-pay | Admitting: Internal Medicine

## 2022-07-27 DIAGNOSIS — E032 Hypothyroidism due to medicaments and other exogenous substances: Secondary | ICD-10-CM

## 2022-09-12 DIAGNOSIS — Z430 Encounter for attention to tracheostomy: Secondary | ICD-10-CM | POA: Diagnosis not present

## 2022-10-18 DIAGNOSIS — Z430 Encounter for attention to tracheostomy: Secondary | ICD-10-CM | POA: Diagnosis not present

## 2022-11-08 ENCOUNTER — Other Ambulatory Visit: Payer: Self-pay

## 2022-11-08 ENCOUNTER — Ambulatory Visit: Payer: Medicaid Other | Admitting: Internal Medicine

## 2022-11-08 ENCOUNTER — Encounter: Payer: Self-pay | Admitting: Internal Medicine

## 2022-11-08 VITALS — BP 149/96 | HR 103 | Temp 98.5°F | Ht 65.0 in | Wt 129.0 lb

## 2022-11-08 DIAGNOSIS — E032 Hypothyroidism due to medicaments and other exogenous substances: Secondary | ICD-10-CM | POA: Diagnosis not present

## 2022-11-08 DIAGNOSIS — E7841 Elevated Lipoprotein(a): Secondary | ICD-10-CM | POA: Diagnosis not present

## 2022-11-08 DIAGNOSIS — M170 Bilateral primary osteoarthritis of knee: Secondary | ICD-10-CM

## 2022-11-08 DIAGNOSIS — I10 Essential (primary) hypertension: Secondary | ICD-10-CM | POA: Diagnosis not present

## 2022-11-08 DIAGNOSIS — R490 Dysphonia: Secondary | ICD-10-CM | POA: Insufficient documentation

## 2022-11-08 DIAGNOSIS — Z87891 Personal history of nicotine dependence: Secondary | ICD-10-CM

## 2022-11-08 DIAGNOSIS — K219 Gastro-esophageal reflux disease without esophagitis: Secondary | ICD-10-CM

## 2022-11-08 MED ORDER — OMEPRAZOLE 20 MG PO CPDR
20.0000 mg | DELAYED_RELEASE_CAPSULE | Freq: Every day | ORAL | 1 refills | Status: DC
Start: 2022-11-08 — End: 2023-02-15

## 2022-11-08 NOTE — Assessment & Plan Note (Signed)
Refilled omeprazole 20 mg daily  

## 2022-11-08 NOTE — Assessment & Plan Note (Signed)
Continues to be well controlled on home ambulatory monitoring. Elevated today but with known white coat HTN. Continue amlodipine-olmesartan. Recent lab work with normal renal function. Will recheck again at follow up.

## 2022-11-08 NOTE — Progress Notes (Signed)
Subjective:   Patient ID: Jonathan [name] male   DOB: [birth year] 62 y.o.   MRN: [id number]  HPI: [name] is a 62 y.o. male with past medical history outlined below here for follow up of HTN and hypothyroidism. For further details of today's visit, please refer to the assessment and plan below.   Past Medical History:  Diagnosis Date   Allergic rhinitis    Arthritis    Bilateral knee arthritis for about 20 years. Not taking any medications, believes in nature processes   Colon polyp    Dental caries    prechemoradiation therapy, periodontitis   Elevated blood pressure reading    White Coat Hypertension   GERD (gastroesophageal reflux disease)    Headache    History of radiation therapy 09/11/16- 10/30/16   Larynx 70 Gy in 35 fractions   HLD (hyperlipidemia)    Hypothyroidism    radiation induced hypothyroidism   Internal hemorrhoids    Laryngeal cancer ([facility])    Pneumonia    as a child   Tubular adenoma of colon    Current Outpatient Medications  Medication Sig Dispense Refill   acetaminophen (TYLENOL) 500 MG tablet Take 1,000 mg by mouth every 6 (six) hours as needed for moderate pain or headache.     amlodipine-olmesartan (AZOR) 10-20 MG tablet TAKE 1 TABLET BY MOUTH DAILY 90 tablet 3   Chlorpheniramine-PSE-Ibuprofen (ADVIL ALLERGY SINUS) 2-30-200 MG TABS Take 1 tablet by mouth daily as needed (allergies).     ibuprofen (ADVIL) 200 MG tablet Take 400 mg by mouth every 6 (six) hours as needed for headache or moderate pain.     levothyroxine (SYNTHROID) 112 MCG tablet TAKE 1 TABLET(112 MCG) BY MOUTH DAILY 90 tablet 1   methocarbamol (ROBAXIN) 750 MG tablet Take 2 tablets (1,500 mg total) by mouth every 8 (eight) hours as needed for muscle spasms. 60 tablet 1   omeprazole (PRILOSEC) 20 MG capsule Take 1 capsule (20 mg total) by mouth daily. 90 capsule 1   rosuvastatin (CRESTOR) 40 MG tablet TAKE 1 TABLET(40 MG) BY MOUTH DAILY 30 tablet 11   Wheat Dextrin (BENEFIBER) POWD  Take 1 Dose by mouth daily.     No current facility-administered medications for this visit.   Family History  Problem Relation Age of Onset   Prostate cancer Maternal Uncle 66       On radiation therapy now, diagnosed in around June 2012   Colon cancer Maternal Uncle    Heart attack Paternal Grandfather        Diet due to MI   Lung cancer Paternal Aunt    Brain cancer Paternal Aunt    Kidney disease Maternal Uncle    Hypertension Mother    CAD Father    Colon polyps Neg Hx    Diabetes Neg Hx    Gallbladder disease Neg Hx    Esophageal cancer Neg Hx    Social History   Socioeconomic History   Marital status: Single    Spouse name: Not on file   Number of children: 4   Years of education: Not on file   Highest education level: Not on file  Occupational History   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   Drug use: No    Frequency: 2.0 times per week    Comment: denies   Sexual activity: Yes    Partners: Female  Other Topics Concern   Not on file  Social History Narrative   Lives in [place] with his fiance.   Moved to [place] 11 years before. Used to live in [place] for 20 years before that.   Has 4 healthy kids from previous wife- separated now.   Is not working full-time since last 2 and half years- since early 2010.   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   Social Determinants of Health   Financial Resource Strain: Low Risk  (05/24/2022)   Overall Financial Resource Strain (CARDIA)    Difficulty of Paying Living Expenses: Not very  hard  Food Insecurity: No Food Insecurity (05/24/2022)   Hunger Vital Sign    Worried About Running Out of Food in the Last Year: Never true    Ran Out of Food in the Last Year: Never true  Transportation Needs: No Transportation Needs (05/24/2022)   PRAPARE - Administrator, Civil Service (Medical): No    Lack of Transportation (Non-Medical): No  Physical Activity: Sufficiently Active (05/24/2022)   Exercise Vital Sign    Days of Exercise per Week: 7 days    Minutes of Exercise per Session: 40 min  Stress: No Stress Concern Present (05/24/2022)   [employer] of Occupational Health - Occupational Stress Questionnaire    Feeling of Stress : Only a little  Social Connections: Moderately Integrated (05/24/2022)   Social Connection and Isolation Panel [NHANES]    Frequency of Communication with Friends and Family: More than three times a week    Frequency of Social Gatherings with Friends and Family: More than three times a week    Attends Religious Services: More than 4 times per year    Active Member of [employer] or Organizations: No    Attends Banker Meetings: Never    Marital Status: Living with partner     Objective:  Physical Exam:  Vitals:   11/08/22 1024  BP: (!) 149/96  Pulse: (!) 103  Temp: 98.5 F (36.9 C)  TempSrc: Oral  SpO2: 93%  Weight: 129 lb (58.5 kg)  Height: 5\' 5"  (1.651 m)    Constitutional: Appears well, NAD HEENT: Tracheostomy speaking valve, stoma is clean  Cardiovascular: RRR, no m/r/g Pulmonary/Chest: Clear bilaterally, normal effort Extremities: Warm, no edema   Assessment & Plan:   White coat syndrome with diagnosis of hypertension Continues to be well controlled on home ambulatory monitoring. Elevated today but with known white coat HTN. Continue amlodipine-olmesartan. Recent lab work with normal renal function. Will recheck again at follow up.    Acid reflux Refilled omeprazole 20 mg daily.   Hypothyroidism,  iatrogenic Last TSH within normal limits. No symptoms on synthroid 112 mcg daily. Rechecking TSH today. If still WNL, will space out TSH checks to yearly.   Bilateral primary osteoarthritis of knee Symptoms are currently well controlled. Last steroid injection was over a year ago. Can continue PRN as this seems to provide good long lasting relief.

## 2022-11-08 NOTE — Assessment & Plan Note (Signed)
Symptoms are currently well controlled. Last steroid injection was over a year ago. Can continue PRN as this seems to provide good long lasting relief.

## 2022-11-08 NOTE — Assessment & Plan Note (Signed)
Last TSH within normal limits. No symptoms on synthroid 112 mcg daily. Rechecking TSH today. If still WNL, will space out TSH checks to yearly.

## 2022-11-08 NOTE — Patient Instructions (Signed)
Mr. Tippens,  It was a pleasure to see you as always. I will call you with your lab results in a day or two. Continue taking your medicines as prescribed.   If you have any questions or concerns, call our clinic at [phone] or after hours call [phone] and ask for the internal medicine resident on call.   Thank you!  Dr. Reece Agar

## 2022-12-01 ENCOUNTER — Emergency Department (HOSPITAL_BASED_OUTPATIENT_CLINIC_OR_DEPARTMENT_OTHER): Payer: Medicaid Other

## 2022-12-01 ENCOUNTER — Observation Stay (HOSPITAL_BASED_OUTPATIENT_CLINIC_OR_DEPARTMENT_OTHER)
Admission: EM | Admit: 2022-12-01 | Discharge: 2022-12-02 | Disposition: A | Discharge status: Home / Self Care | Payer: Medicaid Other | Source: Home / Self Care | Attending: Emergency Medicine | Admitting: Emergency Medicine

## 2022-12-01 ENCOUNTER — Other Ambulatory Visit: Payer: Self-pay

## 2022-12-01 ENCOUNTER — Encounter (HOSPITAL_BASED_OUTPATIENT_CLINIC_OR_DEPARTMENT_OTHER): Payer: Self-pay

## 2022-12-01 DIAGNOSIS — Z79899 Other long term (current) drug therapy: Secondary | ICD-10-CM | POA: Diagnosis not present

## 2022-12-01 DIAGNOSIS — Z87891 Personal history of nicotine dependence: Secondary | ICD-10-CM | POA: Insufficient documentation

## 2022-12-01 DIAGNOSIS — G453 Amaurosis fugax: Secondary | ICD-10-CM | POA: Diagnosis not present

## 2022-12-01 DIAGNOSIS — E039 Hypothyroidism, unspecified: Secondary | ICD-10-CM | POA: Diagnosis not present

## 2022-12-01 DIAGNOSIS — H538 Other visual disturbances: Secondary | ICD-10-CM | POA: Diagnosis present

## 2022-12-01 DIAGNOSIS — H53121 Transient visual loss, right eye: Secondary | ICD-10-CM | POA: Diagnosis not present

## 2022-12-01 DIAGNOSIS — I10 Essential (primary) hypertension: Secondary | ICD-10-CM | POA: Insufficient documentation

## 2022-12-01 DIAGNOSIS — Z8521 Personal history of malignant neoplasm of larynx: Secondary | ICD-10-CM | POA: Diagnosis not present

## 2022-12-01 LAB — COMPREHENSIVE METABOLIC PANEL
ALT: 14 U/L (ref 0–44)
AST: 21 U/L (ref 15–41)
Albumin: 4.4 g/dL (ref 3.5–5.0)
Alkaline Phosphatase: 51 U/L (ref 38–126)
Anion gap: 9 (ref 5–15)
BUN: 14 mg/dL (ref 8–23)
CO2: 25 mmol/L (ref 22–32)
Calcium: 9 mg/dL (ref 8.9–10.3)
Chloride: 104 mmol/L (ref 98–111)
Creatinine, Ser: 0.98 mg/dL (ref 0.61–1.24)
GFR, Estimated: >60 mL/min (ref >60)
Glucose, Bld: 110 mg/dL — ABNORMAL HIGH (ref 70–99)
Potassium: 3.9 mmol/L (ref 3.5–5.1)
Sodium: 138 mmol/L (ref 135–145)
Total Bilirubin: 0.5 mg/dL (ref 0.3–1.2)
Total Protein: 7.3 g/dL (ref 6.5–8.1)

## 2022-12-01 LAB — CBC WITH DIFFERENTIAL/PLATELET
Abs Immature Granulocytes: 0.01 10*3/uL (ref 0.00–0.07)
Basophils Absolute: 0.1 10*3/uL (ref 0.0–0.1)
Basophils Relative: 2 %
Eosinophils Absolute: 0 10*3/uL (ref 0.0–0.5)
Eosinophils Relative: 1 %
HCT: 40.4 % (ref 39.0–52.0)
Hemoglobin: 13.3 g/dL (ref 13.0–17.0)
Immature Granulocytes: 0 %
Lymphocytes Relative: 15 %
Lymphs Abs: 0.8 10*3/uL (ref 0.7–4.0)
MCH: 30.4 pg (ref 26.0–34.0)
MCHC: 32.9 g/dL (ref 30.0–36.0)
MCV: 92.4 fL (ref 80.0–100.0)
Monocytes Absolute: 0.6 10*3/uL (ref 0.1–1.0)
Monocytes Relative: 12 %
Neutro Abs: 3.8 10*3/uL (ref 1.7–7.7)
Neutrophils Relative %: 70 %
Platelets: 270 10*3/uL (ref 150–400)
RBC: 4.37 MIL/uL (ref 4.22–5.81)
RDW: 14.8 % (ref 11.5–15.5)
WBC: 5.3 10*3/uL (ref 4.0–10.5)
nRBC: 0 % (ref 0.0–0.2)

## 2022-12-01 LAB — CBG MONITORING, ED: Glucose-Capillary: 124 mg/dL — ABNORMAL HIGH (ref 70–99)

## 2022-12-01 MED ORDER — ACETAMINOPHEN 500 MG PO TABS: 1000.0000 mg | ORAL_TABLET | Freq: Four times a day (QID) | ORAL | Status: DC | PRN

## 2022-12-01 MED ORDER — IOHEXOL 350 MG/ML SOLN
100.0000 mL | Freq: Once | INTRAVENOUS | Status: AC | PRN
  Administered 2022-12-01: 75 mL via INTRAVENOUS

## 2022-12-01 MED ORDER — FLUORESCEIN SODIUM 1 MG OP STRP
1.0000 | ORAL_STRIP | Freq: Once | OPHTHALMIC | Status: DC
  Filled 2022-12-01: qty 1

## 2022-12-01 MED ORDER — POLYETHYLENE GLYCOL 3350 17 G PO PACK: 17.0000 g | PACK | Freq: Every day | ORAL | Status: DC | PRN

## 2022-12-01 MED ORDER — LEVOTHYROXINE SODIUM 112 MCG PO TABS
112.0000 ug | ORAL_TABLET | Freq: Every day | ORAL | Status: DC
  Administered 2022-12-02: 112 ug via ORAL
  Filled 2022-12-01: qty 1

## 2022-12-01 MED ORDER — RIVAROXABAN 10 MG PO TABS
10.0000 mg | ORAL_TABLET | Freq: Every day | ORAL | Status: DC
  Administered 2022-12-02: 10 mg via ORAL
  Filled 2022-12-01: qty 1

## 2022-12-01 MED ORDER — ROSUVASTATIN CALCIUM 20 MG PO TABS
40.0000 mg | ORAL_TABLET | Freq: Every day | ORAL | Status: DC
  Administered 2022-12-02: 40 mg via ORAL
  Filled 2022-12-01: qty 2

## 2022-12-01 MED ORDER — PANTOPRAZOLE SODIUM 40 MG PO TBEC
40.0000 mg | DELAYED_RELEASE_TABLET | Freq: Every day | ORAL | Status: DC
  Administered 2022-12-02: 40 mg via ORAL
  Filled 2022-12-01: qty 1

## 2022-12-01 MED ORDER — TETRACAINE HCL 0.5 % OP SOLN
2.0000 [drp] | Freq: Once | OPHTHALMIC | Status: DC
  Filled 2022-12-01: qty 4

## 2022-12-01 NOTE — Hospital Course (Signed)
Last time that he got his medicatioin Never seen by  Only wears reading glasses Never had problems or this No Temporal tenderness NO ]Jaw pain  No recent vitral inections  Patient was looking up   No eye infections or eye procedure No pain with eye movement  How lonmgg was wiuhtout pain 11AM  ED at [facility], still having pain in the eye but the vision was ggoinjg from gray to  Allergies Any headache Symptoms with straining    Medications

## 2022-12-01 NOTE — ED Notes (Signed)
Discussed with provider and she is going to see the patient and placed orders as needed.

## 2022-12-01 NOTE — ED Notes (Signed)
Patient returned from Radiology. 

## 2022-12-01 NOTE — ED Provider Notes (Signed)
[place] [facility] AT [facility] Provider Note   CSN: [id number] Arrival date & time: 12/01/22  1206     History  Chief Complaint  Patient presents with   Eye Problem    Jonathan [name] is a 62 y.o. male.  HPI     62yo male with history of laryngeal cancer, hypothyroidism, hyperlipidemia, hypertension who presents with transient monocular vision loss of the right eye.  Reports he was in his normal state of health until about 1 hour ago when he was working on something and suddenly his right eye vision went gray.  Reports that it is improving significantly on arrival to the emergency department, and only has a tiny sliver of gray at the bottom of his vision.  He reports some sensation of pressure around his eye, but no eye pain.  No numbness, weakness, difficulty walking or talking, facial droop, dizziness or other concerns.  Denies any fevers.  Past Medical History:  Diagnosis Date   Allergic rhinitis    Arthritis    Bilateral knee arthritis for about 20 years. Not taking any medications, believes in nature processes   Colon polyp    Dental caries    prechemoradiation therapy, periodontitis   Elevated blood pressure reading    White Coat Hypertension   GERD (gastroesophageal reflux disease)    Headache    History of radiation therapy 09/11/16- 10/30/16   Larynx 70 Gy in 35 fractions   HLD (hyperlipidemia)    Hypothyroidism    radiation induced hypothyroidism   Internal hemorrhoids    Laryngeal cancer (HCC)    Pneumonia    as a child   Tubular adenoma of colon      Home Medications Prior to Admission medications   Medication Sig Start Date End Date Taking? Authorizing Provider  acetaminophen (TYLENOL) 500 MG tablet Take 1,000 mg by mouth every 6 (six) hours as needed for moderate pain or headache.    [provider]  amlodipine-olmesartan (AZOR) 10-20 MG tablet TAKE 1 TABLET BY MOUTH DAILY 07/17/22   [name], MD   Chlorpheniramine-PSE-Ibuprofen (ADVIL ALLERGY SINUS) 2-30-200 MG TABS Take 1 tablet by mouth daily as needed (allergies).    [provider]  ibuprofen (ADVIL) 200 MG tablet Take 400 mg by mouth every 6 (six) hours as needed for headache or moderate pain.    [provider]  levothyroxine (SYNTHROID) 112 MCG tablet TAKE 1 TABLET(112 MCG) BY MOUTH DAILY 04/18/22   [name], MD  methocarbamol (ROBAXIN) 750 MG tablet Take 2 tablets (1,500 mg total) by mouth every 8 (eight) hours as needed for muscle spasms. 08/17/21   [name], MD  omeprazole (PRILOSEC) 20 MG capsule Take 1 capsule (20 mg total) by mouth daily. 11/08/22   [name], MD  rosuvastatin (CRESTOR) 40 MG tablet TAKE 1 TABLET(40 MG) BY MOUTH DAILY 10/18/21   [name], MD  Wheat Dextrin (BENEFIBER) POWD Take 1 Dose by mouth daily.    [provider]      Allergies    Other    Review of Systems   Review of Systems  Physical Exam Updated Vital Signs BP (!) 173/105   Pulse 87   Temp 98.4 F (36.9 C) (Oral)   Resp 17   Ht 5\' 5"  (1.651 m)   Wt 59.4 kg   SpO2 99%   BMI 21.79 kg/m  Physical Exam Vitals and nursing note reviewed.  Constitutional:      General: He is  not in acute distress.    Appearance: Normal appearance. He is well-developed. He is not ill-appearing or diaphoretic.  HENT:     Head: Normocephalic and atraumatic.  Eyes:     General: No visual field deficit.    Extraocular Movements: Extraocular movements intact.     Conjunctiva/sclera: Conjunctivae normal.     Pupils: Pupils are equal, round, and reactive to light.  Cardiovascular:     Rate and Rhythm: Normal rate and regular rhythm.     Pulses: Normal pulses.     Heart sounds: Normal heart sounds. No murmur heard.    No friction rub. No gallop.  Pulmonary:     Effort: Pulmonary effort is normal. No respiratory distress.     Breath sounds: Normal breath sounds. No wheezing or rales.  Abdominal:      General: There is no distension.     Palpations: Abdomen is soft.     Tenderness: There is no abdominal tenderness. There is no guarding.  Musculoskeletal:        General: No swelling or tenderness.     Cervical back: Normal range of motion.  Skin:    General: Skin is warm and dry.     Findings: No erythema or rash.  Neurological:     General: No focal deficit present.     Mental Status: He is alert and oriented to person, place, and time.     GCS: GCS eye subscore is 4. GCS verbal subscore is 5. GCS motor subscore is 6.     Cranial Nerves: No cranial nerve deficit, dysarthria or facial asymmetry.     Sensory: No sensory deficit.     Motor: No weakness or tremor.     Coordination: Coordination normal. Finger-Nose-Finger Test normal.     Gait: Gait normal.     ED Results / Procedures / Treatments   Labs (all labs ordered are listed, but only abnormal results are displayed) Labs Reviewed  COMPREHENSIVE METABOLIC PANEL - Abnormal; Notable for the following components:      Result Value   Glucose, Bld 110 (*)    All other components within normal limits  CBG MONITORING, ED - Abnormal; Notable for the following components:   Glucose-Capillary 124 (*)    All other components within normal limits  CBC WITH DIFFERENTIAL/PLATELET    EKG EKG Interpretation Date/Time:  Friday December 01 2022 13:01:41 EDT Ventricular Rate:  81 PR Interval:  178 QRS Duration:  77 QT Interval:  364 QTC Calculation: 423 R Axis:   -15  Text Interpretation: Sinus rhythm Borderline left axis deviation RSR' in V1 or V2, right VCD or RVH Borderline ST elevation, anterior leads Suspect early repolarization, more exaggerated than previous however similar to 07/2017 Confirmed by Alvira Monday ([id number]) on 12/01/2022 1:32:09 PM  Radiology CT ANGIO HEAD NECK W WO CM  Result Date: 12/01/2022 CLINICAL DATA:  Right eye vision loss for 45 minutes EXAM: CT ANGIOGRAPHY HEAD AND NECK WITH AND WITHOUT CONTRAST  TECHNIQUE: Multidetector CT imaging of the head and neck was performed using the standard protocol during bolus administration of intravenous contrast. Multiplanar CT image reconstructions and MIPs were obtained to evaluate the vascular anatomy. Carotid stenosis measurements (when applicable) are obtained utilizing NASCET criteria, using the distal internal carotid diameter as the denominator. RADIATION DOSE REDUCTION: This exam was performed according to the departmental dose-optimization program which includes automated exposure control, adjustment of the mA and/or kV according to patient size and/or use of iterative reconstruction technique. CONTRAST:  75mL OMNIPAQUE IOHEXOL 350 MG/ML SOLN COMPARISON:  Brain MRI 06/21/2017 FINDINGS: CT HEAD FINDINGS Brain: There is no acute intracranial hemorrhage, extra-axial fluid collection, or acute infarct Parenchymal volume is normal. The ventricles are normal in size. Gray-white differentiation is preserved. There is mineralization in the right caudate and lentiform nucleus, unchanged. The pituitary and suprasellar region are normal. There is no mass lesion. There is no mass effect or midline shift. Vascular: See below. Skull: Normal. Negative for fracture or focal lesion. Sinuses/Orbits: The paranasal sinuses are clear. The globes and orbits are unremarkable. Other: The mastoid air cells and middle ear cavities are clear. Review of the MIP images confirms the above findings CTA NECK FINDINGS Aortic arch: The imaged aortic arch is normal. The origins of the major branch vessels are patent. The subclavian arteries are patent to the level imaged. Right carotid system: The right common carotid artery is patent. The internal carotid artery is occluded from the bifurcation throughout the remainder of the course in the neck. External carotid artery is patent. Left carotid system: The left common carotid artery is patent with mild irregularity. There is mild plaque at the  bifurcation but no hemodynamically significant stenosis or occlusion. There is no evidence of dissection or aneurysm. Vertebral arteries: The vertebral arteries are patent, without hemodynamically significant stenosis or occlusion. There is no evidence of dissection or aneurysm. Skeleton: There is no acute osseous abnormality or suspicious osseous lesion. There is no visible canal hematoma. There is disc space narrowing and degenerative endplate change at C6-C7. Other neck: The patient is status post laryngectomy. A TEP device is noted. Upper chest: The imaged lung apices are clear. Review of the MIP images confirms the above findings CTA HEAD FINDINGS Anterior circulation: There is diminutive reconstitution of flow in the petrous segment of the intracranial ICA with thready enhancement. There is reconstitution of more normal caliber flow at the communicating segment likely via collateral flow across the anterior and posterior communicating arteries. The right MCA and ACA are patent. The left intracranial ICA is patent. The left MCA and ACAS are patent, without proximal stenosis or occlusion. The anterior communicating artery is normal. There is no aneurysm or AVM. Posterior circulation: The bilateral V4 segments are patent. The basilar artery is patent. The major cerebellar arteries appear patent. The bilateral PCAs are patent, without proximal stenosis or occlusion. A right posterior communicating artery is identified. There is no aneurysm or AVM. Venous sinuses: Patent. Anatomic variants: As above. Review of the MIP images confirms the above findings IMPRESSION: 1. No acute intracranial pathology on initial noncontrast head CT. 2. Unchanged mineralization in the right basal ganglia. 3. 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 Patent anterior and cerebral arteries. This is age-indeterminate but new since 2019. 4. Otherwise patent vasculature in the head and neck with mild plaque at the left carotid bifurcation but no other hemodynamically significant stenosis or occlusion. Findings discussed with Dr. Selina Cooley at 2:11 pm. Electronically Signed   By: Lesia Hausen M.D.   On: 12/01/2022 14:21    Procedures Procedures    Medications Ordered in ED Medications  iohexol (OMNIPAQUE) 350 MG/ML injection 100 mL (75 mLs Intravenous Contrast Given 12/01/22 1348)    ED Course/ Medical Decision Making/ A&P  62yo male with history of laryngeal cancer, hypothyroidism, hyperlipidemia, hypertension who presents with transient monocular vision loss of the right eye.  Transient painless monocular vision loss is not consistent with orbital cellulitis, acute angle-closure glaucoma, corneal abrasion, retinal detachment. Concern for CRAO with improvement.  CTA completed and evaluated by me and radiology.  Hyperdensity seen is unchanged when compared to previous and is an unchanged area of mineralization of the right basal ganglia, without signs of acute bleed or other acute intracranial abnormality.  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  He has no acute left-sided symptoms, or acute neurologic changes with the exception of his transient monocular vision loss.  He reports his symptoms have resolved completely at this time.  Labs completed and personally about interpreted by me show no anemia, no leukocytosis, no clinically significant electrolyte abnormalities.   EKG within normal sinus rhythm.  Discussed with Dr. Selina Cooley of neurology, who agrees with admission for TIA workup.  Discussed with [name] of  ophthalmology, who agrees with TIA workup and recommends follow-up with ophthalmology in 1 week.  Admitted for further care.        Final Clinical Impression(s) / ED Diagnoses Final diagnoses:  Amaurosis fugax of right eye    Rx / DC Orders ED Discharge Orders     None         Alvira Monday, MD 12/01/22 [phone]

## 2022-12-01 NOTE — ED Triage Notes (Addendum)
The patient stated his right eye went back at 11:00 and it is throbbing. He stated his vision is started to coming back now. No head injury. No headache.

## 2022-12-01 NOTE — ED Notes (Signed)
[facility] at bedside 

## 2022-12-01 NOTE — ED Notes (Signed)
Patient transported to Radiology at this time.  

## 2022-12-01 NOTE — Plan of Care (Signed)
Transfer from [facility].  [facility] patient please call their service once the patient arrives.  Case discussed with Dr. Dalene Seltzer. Mr. Grossberg is a 62 year old male with pmh hypertension, hyperlipidemia, hypothyroidism, laryngeal cancer, colon polyps, history of GI bleed, and GERD who presented with transient right eye loss. No head injury reported.  Labs were relatively unremarkable.  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 Case discussed with Neurology who felt occlusion was likely chronic.  Plan was to discuss case with ophthalmology as well to be seen in her note.  Most the patient has recurrent except for a small area. Current diagnosis is amaurosis fugax of right eye.

## 2022-12-01 NOTE — ED Notes (Signed)
Attempted report x1, no answer by receiving RN .

## 2022-12-01 NOTE — ED Notes (Signed)
Pt provided microwaveable meal, coffee per his request. EDP Schlossman aware and agreeable.

## 2022-12-01 NOTE — H&P (Signed)
Date: 12/01/2022               Patient Name:  Jonathan [name] MRN: [id number]  DOB: 02/18/1961 Age / Sex: 62 y.o., male   PCP: [name], MD         Medical Service: Internal Medicine Teaching Service         Attending Physician: Dr. Clydie Braun, MD      First Contact: {[id number]}    Second Contact: Dr. Rocky Morel, DO Pager [phone]         After Hours (After 5p/  First Contact Pager: [phone]  weekends / holidays): Second Contact Pager: [phone]   SUBJECTIVE   Chief Complaint: Painful transient right vision loss  History of Present Illness: Jonathan [name] is a 62 year old male with a past medical history of   ED Course:  Meds:  Amlodipine-olmesartan 10-20 Synthroid Prilosec 20mg   Crestor 40mg   Tylenol prn    Past Medical History Hypothyroidism Hypertension GERD Laryngeal cancer  Hyperlipidemia GI bleed    Past Surgical History:  Procedure Laterality Date   COLONOSCOPY  03/18/2012   DIRECT LARYNGOSCOPY N/A 07/06/2017   Procedure: DIRECT LARYNGOSCOPY;  Surgeon: Serena Colonel, MD;  Location: [facility] OR;  Service: ENT;  Laterality: N/A;   ESOPHAGOSCOPY  04/24/2016   Procedure: ESOPHAGOSCOPY;  Surgeon: Serena Colonel, MD;  Location: [facility] OR;  Service: ENT;;   EVALUATION UNDER ANESTHESIA WITH HEMORRHOIDECTOMY N/A 05/15/2019   Procedure: ANORECTAL EXAM UNDER ANESTHESIA WITH EXCISION; HEMORRHOIDECTOMY, FULGURATION OF FLAT CONDYLOMA;  Surgeon: Andria Meuse, MD;  Location: [facility] ORS;  Service: General;  Laterality: N/A;   IR GENERIC HISTORICAL  06/02/2016   IR US GUIDE VASC ACCESS RIGHT 06/02/2016 Simonne Come, MD [facility]   IR GENERIC HISTORICAL  06/02/2016   IR FLUORO GUIDE PORT INSERTION RIGHT 06/02/2016 Simonne Come, MD [facility]   IR REMOVAL TUN ACCESS W/ PORT W/O FL MOD SED  04/16/2018   LARYNGETOMY N/A 08/10/2017   Procedure: TOTAL LARYNGECTOMY;  Surgeon: Serena Colonel, MD;  Location: [facility] OR;  Service: ENT;  Laterality: N/A;   LARYNGOSCOPY   04/24/2016   Procedure: LARYNGOSCOPY;  Surgeon: Serena Colonel, MD;  Location: [facility] OR;  Service: ENT;;   MULTIPLE EXTRACTIONS WITH ALVEOLOPLASTY N/A 05/26/2016   Procedure: Extraction of tooth #'s 2,5,6,8-13, 21-28 with alveoloplasty, bilateral mandibular tori reductions and buccal exostoses reductions of maxillary left, maxillary right, and mandibular left quadrants.;  Surgeon: Charlynne Pander, DDS;  Location: [facility] OR;  Service: Oral Surgery;  Laterality: N/A;   STOMAPLASTY N/A 06/05/2018   Procedure: Tracheal STOMAPLASTY;  Surgeon: Serena Colonel, MD;  Location: [facility] OR;  Service: ENT;  Laterality: N/A;   TRACHEAL ESOPHAGEAL PROSTHESIS (TEP) CHANGE N/A 09/25/2018   Procedure: TRACHEAL ESOPHAGEAL PROSTHESIS (TEP) CHANGE;  Surgeon: Serena Colonel, MD;  Location: [facility] OR;  Service: ENT;  Laterality: N/A;   TRACHEOSTOMY TUBE PLACEMENT N/A 04/24/2016   Procedure: TRACHEOSTOMY;  Surgeon: Serena Colonel, MD;  Location: [facility] OR;  Service: ENT;  Laterality: N/A;    Social:  Lives With:significant other Support:significant other Level of Function: independent ADL and iADL PCP: Dr. Antony Contras MD Substances: former smoker with 15 pack year history, rarely uses alcohol, denies illicit substance use   Family History: Denies history of CVA in parents or siblings   Allergies: Allergies as of 12/01/2022 - Review Complete 12/01/2022  Allergen Reaction Noted   Other Rash 11/12/2019    Review of Systems: A complete ROS was negative except as per  HPI.   OBJECTIVE:   Physical Exam: Blood pressure (!) 186/104, pulse 91, temperature 98.4 F (36.9 C), temperature source Oral, resp. rate 18, height 5\' 5"  (1.651 m), weight 59.4 kg, SpO2 99%.  Constitutional: well-appearing, comfortably sitting in bed, in no acute distress HEENT: normocephalic atraumatic, mucous membranes moist, tracheal prosthetic present  -Eyes: PERRLA, bilateral eye exam wearing reading glasses: 20/20, EOM intact, Nonpainful EOM,  Cardiovascular: regular rate  and rhythm, no m/r/g, no carotid bruits auscultated  Pulmonary/Chest: normal work of breathing on room air, lungs clear to auscultation bilaterally. No crackles  Abdominal: soft, non-tender, non-distended, bowel sounds present Neurological: alert & oriented x 3, CN 2-12 intact, strength 5/5 upper and lower extremities, sensation equally present in upper and lower extremities MSK: no gross abnormalities. No pitting edema, radial pulses 2/4, DP pulses 1/4 Skin: warm and dry Psych: Normal mood and affect  Labs: CBC    Component Value Date/Time   WBC 5.3 12/01/2022 1254   RBC 4.37 12/01/2022 1254   HGB 13.3 12/01/2022 1254   HGB 15.0 05/24/2022 1018   HGB 11.9 (L) 01/04/2017 1432   HCT 40.4 12/01/2022 1254   HCT 43.7 05/24/2022 1018   HCT 35.7 (L) 01/04/2017 1432   PLT 270 12/01/2022 1254   PLT 331 05/24/2022 1018   MCV 92.4 12/01/2022 1254   MCV 91 05/24/2022 1018   MCV 90.5 01/04/2017 1432   MCH 30.4 12/01/2022 1254   MCHC 32.9 12/01/2022 1254   RDW 14.8 12/01/2022 1254   RDW 14.4 05/24/2022 1018   RDW 16.1 (H) 01/04/2017 1432   LYMPHSABS 0.8 12/01/2022 1254   LYMPHSABS 1.0 05/24/2022 1018   LYMPHSABS 0.5 (L) 01/04/2017 1432   MONOABS 0.6 12/01/2022 1254   MONOABS 0.5 01/04/2017 1432   EOSABS 0.0 12/01/2022 1254   EOSABS 0.1 05/24/2022 1018   BASOSABS 0.1 12/01/2022 1254   BASOSABS 0.1 05/24/2022 1018   BASOSABS 0.0 01/04/2017 1432     CMP     Component Value Date/Time   NA 138 12/01/2022 1254   NA 145 (H) 05/24/2022 1018   NA 141 01/04/2017 1432   K 3.9 12/01/2022 1254   K 4.1 01/04/2017 1432   CL 104 12/01/2022 1254   CO2 25 12/01/2022 1254   CO2 27 01/04/2017 1432   GLUCOSE 110 (H) 12/01/2022 1254   GLUCOSE 93 01/04/2017 1432   BUN 14 12/01/2022 1254   BUN 11 05/24/2022 1018   BUN 12.2 01/04/2017 1432   CREATININE 0.98 12/01/2022 1254   CREATININE 0.8 01/04/2017 1432   CALCIUM 9.0 12/01/2022 1254   CALCIUM 9.8 01/04/2017 1432   PROT 7.3 12/01/2022 1254    PROT 7.9 05/24/2022 1018   PROT 6.9 01/04/2017 1432   ALBUMIN 4.4 12/01/2022 1254   ALBUMIN 5.0 (H) 05/24/2022 1018   ALBUMIN 3.8 01/04/2017 1432   AST 21 12/01/2022 1254   AST 18 01/04/2017 1432   ALT 14 12/01/2022 1254   ALT 12 01/04/2017 1432   ALKPHOS 51 12/01/2022 1254   ALKPHOS 67 01/04/2017 1432   BILITOT 0.5 12/01/2022 1254   BILITOT 0.3 05/24/2022 1018   BILITOT 0.23 01/04/2017 1432   GFRNONAA >60 12/01/2022 1254   GFRNONAA 82 03/06/2011 0853   GFRAA 88 11/12/2019 1025   GFRAA >89 03/06/2011 0853    Imaging: CT ANGIO HEAD NECK W WO CM  Result Date: 12/01/2022 CLINICAL DATA:  Right eye vision loss for 45 minutes EXAM: CT ANGIOGRAPHY HEAD AND NECK WITH AND WITHOUT CONTRAST TECHNIQUE:  Multidetector CT imaging of the head and neck was performed using the standard protocol during bolus administration of intravenous contrast. Multiplanar CT image reconstructions and MIPs were obtained to evaluate the vascular anatomy. Carotid stenosis measurements (when applicable) are obtained utilizing NASCET criteria, using the distal internal carotid diameter as the denominator. RADIATION DOSE REDUCTION: This exam was performed according to the departmental dose-optimization program which includes automated exposure control, adjustment of the mA and/or kV according to patient size and/or use of iterative reconstruction technique. CONTRAST:  75mL OMNIPAQUE IOHEXOL 350 MG/ML SOLN COMPARISON:  Brain MRI 06/21/2017 FINDINGS: CT HEAD FINDINGS Brain: There is no acute intracranial hemorrhage, extra-axial fluid collection, or acute infarct Parenchymal volume is normal. The ventricles are normal in size. Gray-white differentiation is preserved. There is mineralization in the right caudate and lentiform nucleus, unchanged. The pituitary and suprasellar region are normal. There is no mass lesion. There is no mass effect or midline shift. Vascular: See below. Skull: Normal. Negative for fracture or focal  lesion. Sinuses/Orbits: The paranasal sinuses are clear. The globes and orbits are unremarkable. Other: The mastoid air cells and middle ear cavities are clear. Review of the MIP images confirms the above findings CTA NECK FINDINGS Aortic arch: The imaged aortic arch is normal. The origins of the major branch vessels are patent. The subclavian arteries are patent to the level imaged. Right carotid system: The right common carotid artery is patent. The internal carotid artery is occluded from the bifurcation throughout the remainder of the course in the neck. External carotid artery is patent. Left carotid system: The left common carotid artery is patent with mild irregularity. There is mild plaque at the bifurcation but no hemodynamically significant stenosis or occlusion. There is no evidence of dissection or aneurysm. Vertebral arteries: The vertebral arteries are patent, without hemodynamically significant stenosis or occlusion. There is no evidence of dissection or aneurysm. Skeleton: There is no acute osseous abnormality or suspicious osseous lesion. There is no visible canal hematoma. There is disc space narrowing and degenerative endplate change at C6-C7. Other neck: The patient is status post laryngectomy. A TEP device is noted. Upper chest: The imaged lung apices are clear. Review of the MIP images confirms the above findings CTA HEAD FINDINGS Anterior circulation: There is diminutive reconstitution of flow in the petrous segment of the intracranial ICA with thready enhancement. There is reconstitution of more normal caliber flow at the communicating segment likely via collateral flow across the anterior and posterior communicating arteries. The right MCA and ACA are patent. The left intracranial ICA is patent. The left MCA and ACAS are patent, without proximal stenosis or occlusion. The anterior communicating artery is normal. There is no aneurysm or AVM. Posterior circulation: The bilateral V4 segments  are patent. The basilar artery is patent. The major cerebellar arteries appear patent. The bilateral PCAs are patent, without proximal stenosis or occlusion. A right posterior communicating artery is identified. There is no aneurysm or AVM. Venous sinuses: Patent. Anatomic variants: As above. Review of the MIP images confirms the above findings IMPRESSION: 1. No acute intracranial pathology on initial noncontrast head CT. 2. Unchanged mineralization in the right basal ganglia. 3. 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 Patent anterior and cerebral arteries. This is age-indeterminate but new since 2019. 4. Otherwise  patent vasculature in the head and neck with mild plaque at the left carotid bifurcation but no other hemodynamically significant stenosis or occlusion. Findings discussed with Dr. Selina Cooley at 2:11 pm. Electronically Signed   By: Lesia Hausen M.D.   On: 12/01/2022 14:21      EKG: personally reviewed my interpretation is NSR, with Qtc 423, borderline ST elevations in lead V1 And V2. Prior EKG in 2020 shows NSR, EKG in 2019 shows ST elevation in leads V1 and V2.  ASSESSMENT & PLAN:   Assessment & Plan by Problem: Principal Problem:   Amaurosis fugax of right eye   Jonathan [name] is a 62 y.o. person living with a history of hypertension, hyperlipidemia, hypothyroidism, laryngeal cancer, and GERD who presented with transient painful vision loss and admitted for amaurosis fugax on hospital day 0  #Transient right vision loss, painful #Amaurosis Fugax vs Glaucoma  Patient presents with mixed symptoms of amaurosis fugax and glaucoma.  Amaurosis fugax is possible, supported by CTA findings of right internal carotid artery occlusion  and from the history of positional changes with neck that precipitated the symptoms. But amaurosis fugax does not typically present with painful vision loss. Glaucoma is also considered due to the pain associated with the vision loss.  Other etiologies considered for painful vision loss include GCA, orbital migraines, orbital cellulitis. Unlikely orbital cellulitis due to the lack of recent infection and intact painless EOM. GCA will be further evaluated with ESR and CRP. Plan: -Neurology consulted, appreciate recommendations -Ophthalmology consulted, appreciate recommendations  -Lipid panel 8/7 revealed total cholesterol 235, LDL 147 -Allow for permissive HTN, will hold HTN meds -A1c pending -ASA 81mg   -telemetry -echocardiogram   #HTN Patient is on a home regimen of amlodipine-olmesartan  -Hold   #HLD: continue home Crestor 40mg   #Hypothyroidism: Continue Home synthroid  #GERD: Continue omeprazole 20mg    #Laryngeal cancer       Diet: Normal VTE: NOAC Code: Full  Prior to Admission Living Arrangement: Home, living with significant other Anticipated Discharge Location: Home Barriers to Discharge: TIA work up  Dispo: Admit patient to Observation with expected length of stay less than 2 midnights.  Signed:   Faith Rogue  Internal Medicine Resident PGY-1 12/01/2022, 10:04 PM   Please contact the on call pager after 5 pm and on weekends at [phone].

## 2022-12-01 NOTE — ED Notes (Signed)
[facility] called for transport @ 17:43  No ETA currently.Marland KitchenMarland Kitchen

## 2022-12-01 NOTE — Consult Note (Signed)
Neurology Consultation Reason for Consult: Transient vision loss Referring Physician: Tenny Craw  CC: Transient vision loss  History is obtained from:PAtient  HPI: Jonathan [name] is a 62 y.o. male with a history of hypothyroidism, hyperlipidemia, headache who presents with transient right eye vision.  He had been working on the ceiling, looking up for a long period of time when all of a sudden his right eye vision went out and he began throbbing.  He states that it was severely painful hurting just behind his eye.  This lasted for approximately 3 to 4 hours, with the vision being gone from that time as well as hurting for the entire time.  While he was being evaluated at the med center, his vision was returning and now he feels completely back to normal.  As part of his evaluation, he had a CT angio of the head and neck which demonstrates an occluded right carotid.  He has been admitted for further workup including stroke workup for amaurosis fugax.   LKW: 11 AM tnk given?: no, resolution of symptoms  Past Medical History:  Diagnosis Date   Allergic rhinitis    Arthritis    Bilateral knee arthritis for about 20 years. Not taking any medications, believes in nature processes   Colon polyp    Dental caries    prechemoradiation therapy, periodontitis   Elevated blood pressure reading    White Coat Hypertension   GERD (gastroesophageal reflux disease)    Headache    History of radiation therapy 09/11/16- 10/30/16   Larynx 70 Gy in 35 fractions   HLD (hyperlipidemia)    Hypothyroidism    radiation induced hypothyroidism   Internal hemorrhoids    Laryngeal cancer (HCC)    Pneumonia    as a child   Tubular adenoma of colon      Family History  Problem Relation Age of Onset   Prostate cancer Maternal Uncle 66       On radiation therapy now, diagnosed in around June 2012   Colon cancer Maternal Uncle    Heart attack Paternal Grandfather        Diet due to MI   Lung cancer  Paternal Aunt    Brain cancer Paternal Aunt    Kidney disease Maternal Uncle    Hypertension Mother    CAD Father    Colon polyps Neg Hx    Diabetes Neg Hx    Gallbladder disease Neg Hx    Esophageal cancer Neg Hx      Social History:  reports that he quit smoking about 6 years ago. His smoking use included cigarettes. He started smoking about 36 years ago. He has a 15 pack-year smoking history. He has never used smokeless tobacco. He reports current alcohol use. He reports that he does not use drugs.   Exam: Current vital signs: BP (!) 186/104   Pulse 91   Temp 98.4 F (36.9 C) (Oral)   Resp 18   Ht 5\' 5"  (1.651 m)   Wt 59.4 kg   SpO2 99%   BMI 21.79 kg/m  Vital signs in last 24 hours: Temp:  [98.3 F (36.8 C)-98.4 F (36.9 C)] 98.4 F (36.9 C) (08/30 2041) Pulse Rate:  [87-92] 91 (08/30 2041) Resp:  [13-26] 18 (08/30 2041) BP: (148-186)/(96-111) 186/104 (08/30 2041) SpO2:  [96 %-100 %] 99 % (08/30 2041) Weight:  [59.4 kg] 59.4 kg (08/30 1211)   Physical Exam  Appears well-developed and well-nourished.  He has a chronic  tracheostomy  Neuro: Mental Status: Patient is awake, alert, oriented to person, place, month, year, and situation. Patient is able to give a clear and coherent history. No signs of aphasia or neglect Cranial Nerves: II: Visual Fields are full. Pupils are equal, round, and reactive to light.   III,IV, VI: EOMI without ptosis or diploplia.  V: Facial sensation is symmetric to temperature VII: Facial movement is symmetric.  VIII: hearing is intact to voice X: Uvula elevates symmetrically XI: Shoulder shrug is symmetric. XII: tongue is midline without atrophy or fasciculations.  Motor: Tone is normal. Bulk is normal. 5/5 strength was present in all four extremities.  Sensory: Sensation is symmetric to light touch and temperature in the arms and legs. Deep Tendon Reflexes: 2+ and symmetric in the biceps and patellae.  Plantars: Toes are  downgoing bilaterally.  Cerebellar: FNF and HKS are intact bilaterally      I have reviewed labs in epic and the results pertinent to this consultation are: LDL 147 on August 7  I have reviewed the images obtained: CTA-occluded right carotid  Impression: 62 year old male with transient painful vision loss of the right eye. Amaurosis fugax is typically a painless loss of vision, but it is difficult to ignore his occluded right carotid.  The duration was too long for transient hypoperfusion resulting in monocular vision loss due to hemodynamics because it would not have continued to be absent for so long.  The possibility of this being an acute occlusion is there but I think very low since he did not have any other symptoms other than the vision loss.  Anterior ischemic optic neuritis would be another consideration, though this is usually permanent.  Glaucoma can cause painful vision loss, and I would favor getting his intraocular pressures checked with ophthalmology.  Recommendations: 1) aspirin 81 mg daily 2) continue rosuvastatin 40 mg daily, consider more aggressive cholesterol control given elevated LDL 3) A1c 4) telemetry, echo 5) serial NIHSS 6) ophthalmology evaluation including intraocular pressures 7) stroke team will follow   Ritta Slot, MD [facility] [phone]  If 7pm- 7am, please page neurology on call as listed in [facility]ION.

## 2022-12-01 NOTE — ED Notes (Signed)
[facility] called for transport @ 16:09  No ETA Currently.Marland KitchenMarland KitchenMarland Kitchen

## 2022-12-02 ENCOUNTER — Observation Stay (HOSPITAL_COMMUNITY): Payer: Medicaid Other

## 2022-12-02 ENCOUNTER — Other Ambulatory Visit (HOSPITAL_COMMUNITY): Payer: Medicaid Other

## 2022-12-02 DIAGNOSIS — M7989 Other specified soft tissue disorders: Secondary | ICD-10-CM

## 2022-12-02 DIAGNOSIS — G453 Amaurosis fugax: Secondary | ICD-10-CM | POA: Diagnosis not present

## 2022-12-02 DIAGNOSIS — G459 Transient cerebral ischemic attack, unspecified: Secondary | ICD-10-CM

## 2022-12-02 DIAGNOSIS — H53121 Transient visual loss, right eye: Secondary | ICD-10-CM | POA: Diagnosis not present

## 2022-12-02 LAB — RAPID URINE DRUG SCREEN, HOSP PERFORMED
Amphetamines: NOT DETECTED
Barbiturates: NOT DETECTED
Benzodiazepines: NOT DETECTED
Cocaine: NOT DETECTED
Opiates: NOT DETECTED
Tetrahydrocannabinol: POSITIVE — AB

## 2022-12-02 LAB — BASIC METABOLIC PANEL
Anion gap: 9 (ref 5–15)
BUN: 12 mg/dL (ref 8–23)
CO2: 25 mmol/L (ref 22–32)
Calcium: 9.2 mg/dL (ref 8.9–10.3)
Chloride: 102 mmol/L (ref 98–111)
Creatinine, Ser: 0.97 mg/dL (ref 0.61–1.24)
GFR, Estimated: >60 mL/min (ref >60)
Glucose, Bld: 112 mg/dL — ABNORMAL HIGH (ref 70–99)
Potassium: 3.4 mmol/L — ABNORMAL LOW (ref 3.5–5.1)
Sodium: 136 mmol/L (ref 135–145)

## 2022-12-02 LAB — HEMOGLOBIN A1C
Hgb A1c MFr Bld: 6.2 % — ABNORMAL HIGH (ref 4.8–5.6)
Mean Plasma Glucose: 131.24 mg/dL

## 2022-12-02 LAB — HIV ANTIBODY (ROUTINE TESTING W REFLEX): HIV Screen 4th Generation wRfx: NONREACTIVE

## 2022-12-02 LAB — GLUCOSE, CAPILLARY: Glucose-Capillary: 107 mg/dL — ABNORMAL HIGH (ref 70–99)

## 2022-12-02 MED ORDER — ASPIRIN 81 MG PO TBEC
81.0000 mg | DELAYED_RELEASE_TABLET | Freq: Every day | ORAL | Status: DC
  Administered 2022-12-02: 81 mg via ORAL
  Filled 2022-12-02: qty 1

## 2022-12-02 MED ORDER — CLOPIDOGREL BISULFATE 75 MG PO TABS
75.0000 mg | ORAL_TABLET | Freq: Every day | ORAL | Status: DC
  Administered 2022-12-02: 75 mg via ORAL
  Filled 2022-12-02: qty 1

## 2022-12-02 MED ORDER — CLOPIDOGREL BISULFATE 75 MG PO TABS: 75.0000 mg | ORAL_TABLET | Freq: Every day | ORAL | 3 refills | Status: DC

## 2022-12-02 MED ORDER — ASPIRIN 81 MG PO TBEC: 81.0000 mg | DELAYED_RELEASE_TABLET | Freq: Every day | ORAL | 3 refills | Status: AC

## 2022-12-02 MED ORDER — EZETIMIBE 10 MG PO TABS: 10.0000 mg | ORAL_TABLET | Freq: Every day | ORAL | Status: DC

## 2022-12-02 NOTE — Discharge Summary (Signed)
Name: Jonathan [name] MRN: [id number] DOB: 1960-08-05 62 y.o. PCP: [name], MD  Date of Admission: 12/01/2022 12:13 PM Date of Discharge: 12/02/2022 Attending Physician: [name]  Discharge Diagnosis: Principal Problem:   Transient monocular blindness, right    Discharge Medications: Allergies as of 12/02/2022       Reactions   Other Rash   Oranges and tomatoes        Medication List     STOP taking these medications    Advil Allergy Sinus 2-30-200 MG Tabs Generic drug: Chlorpheniramine-PSE-Ibuprofen   ibuprofen 200 MG tablet Commonly known as: ADVIL       TAKE these medications    acetaminophen 500 MG tablet Commonly known as: TYLENOL Take 1,000 mg by mouth every 6 (six) hours as needed for moderate pain or headache.   amlodipine-olmesartan 10-20 MG tablet Commonly known as: AZOR TAKE 1 TABLET BY MOUTH DAILY   aspirin EC 81 MG tablet Take 1 tablet (81 mg total) by mouth daily. Swallow whole. Start taking on: December 03, 2022   clopidogrel 75 MG tablet Commonly known as: PLAVIX Take 1 tablet (75 mg total) by mouth daily.   levothyroxine 112 MCG tablet Commonly known as: SYNTHROID TAKE 1 TABLET(112 MCG) BY MOUTH DAILY   omeprazole 20 MG capsule Commonly known as: PRILOSEC Take 1 capsule (20 mg total) by mouth daily.   rosuvastatin 40 MG tablet Commonly known as: CRESTOR TAKE 1 TABLET(40 MG) BY MOUTH DAILY        Disposition and follow-up:   [name] was discharged from [facility] in Good condition.  At the hospital follow up visit please address:  1.  Follow-up:  a. Hyperlipidemia, adherence to Crestor    b. Right internal carotid occlusion, on DAPT for 3 months then aspirin alone  2.  Labs / imaging needed at time of follow-up: non-fasting LDL  3.  Pending labs/ test needing follow-up: None  4.  Medication Changes  ASA 81mg  daily   Plavix 75mg  daily   Follow-up Appointments:  Follow-up  Information     [name], MD. Schedule an appointment as soon as possible for a visit in 2 week(s).   Specialties: Interventional Radiology, Radiology Contact information: [address] [place] [address] [phone]         [facility]. Schedule an appointment as soon as possible for a visit in 1 month(s).   Specialty: Neurology Why: stroke clinic Contact information: [address] [place] [address] [phone]                Hospital Course by problem list: Jonathan [name]. 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   Transient right vision lossTransient ischemia from microemboli Jonathan [name] presented to the [facility] ED with sudden loss of vision in his right eye while he was working on a ceiling which he described as "seeing black". Vision partially returned within 1 hour and completely returned in 3 hours. CTA demonstrated right internal artery occlusion with collateral flow proximal to the posterior and anterior communicating arteries. He was transferred to [facility] for a complete work-up. At [facility], he had no vision loss, eye  pain or focal neurological deficits. ED consulted neurology who recommended admission for a TIA work up. Ophthalmology agreed with the TIA workup and recommended ophthalmology follow-up in 1 week. MRI could not be obtained due to patients voice box containing a magnet. Neurology scheduled an outpatient cerebral angiogram. Lower extremity venous duplex showed no evidence of DVT in bilateral extremities. Echocardiogram with bubble study performed and read pending  on DC. Patients vision loss was likely due to transient ischemia as a result of a microemboli from right internal carotid artery occlusion. He will be started on dual antiplatelet therapy with ASA and Plavix. DAPT for 3 months then aspirin alone. Follow-up referral to ophthalmology with [name]. Follow-up to be scheduled with neuro IR.  Hypertension He is on a home regimen of amlodipine-olmesartan which was held in the hospital to allow for permissive hypertension. Amlodipine-olmsartan was restarted at discharge.   Hyperlipidemia Patient reports he has been taking Crestor 40mg . Lipid panel on 8/8 revealed elevated LDL and total cholesterol. Patient counseled on the importance of taking his medication. Crestor was continued in the hospital.   Post-radiation hypothyroidism He is on a home regimen of Synthroid 112 mcg which was continued in the hospital.   GERD Managed with omeprazole;continued in the hospital.   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 Follow-up with Dr. [name].     Discharge Subjective: Mr. [name] is doing well this morning. He does not report pain, vision changes or neurological deficits. He is curious to know what caused this, but is ready to go home.   Discharge Exam:   BP (!) 150/104 (BP Location: Left Arm)   Pulse 88   Temp 98.5 F (36.9 C) (Oral)   Resp 17   Ht 5\' 5"  (1.651 m)   Wt 59.4 kg   SpO2 99%   BMI 21.79 kg/m  Constitutional: well-appearing sitting in bed, in no acute distress HEENT: Pupils equal and reactive to light. EOMI. No tenderness on palpation of the eyes. Cardiovascular: RRR, no murmurs, rubs or gallops Pulmonary/Chest: normal work of breathing on room air, lungs clear to auscultation bilaterally Abdominal: soft, non-tender, non-distended Skin: warm and dry Extremities:  no lower extremity edema present Neuro: Alert and oriented x 3.  Pupils equal and reactive to light. EOM intact. No ptosis. Visual fields preserved. No facial droop. Facial sensation intact and equal bilaterally. Tongue and uvula midline;no deviations with protrusion. Tone is normal. Upper and lower strength 5/5 bilaterally. Sensation intact bilaterally. Finger to nose intact. No romberg. Heel to shin intact.   Pertinent Labs, Studies, and Procedures:     Latest Ref Rng & Units 12/01/2022   12:54 PM 05/24/2022   10:18 AM 06/30/2020   10:07 AM  CBC  WBC 4.0 - 10.5 K/uL 5.3  4.3  5.1   Hemoglobin 13.0 - 17.0 g/dL 03.4  74.2  59.5   Hematocrit 39.0 - 52.0 % 40.4  43.7  49.0   Platelets 150 - 400 K/uL 270  331  374        Latest Ref Rng & Units 12/02/2022    3:55 AM 12/01/2022   12:54 PM 05/24/2022   10:18 AM  CMP  Glucose 70 - 99 mg/dL 638  756  81   BUN 8 - 23 mg/dL 12  14  11    Creatinine 0.61 - 1.24 mg/dL 4.33  2.95  1.88   Sodium 135 - 145 mmol/L 136  138  145   Potassium 3.5 - 5.1 mmol/L 3.4  3.9  4.5   Chloride 98 - 111 mmol/L 102  104  105   CO2 22 - 32 mmol/L 25  25  20    Calcium 8.9 - 10.3 mg/dL 9.2  9.0  9.7   Total Protein 6.5 - 8.1 g/dL  7.3  7.9   Total Bilirubin 0.3 - 1.2 mg/dL  0.5  0.3   Alkaline Phos 38 - 126 U/L  51  86   AST 15 - 41 U/L  21  29   ALT 0 - 44 U/L  14  13     VAS [place] LOWER EXTREMITY VENOUS (DVT)  Result Date: 12/02/2022  Lower Venous DVT Study Patient Name:  ARCHIE LITTLE  Date of Exam:   12/02/2022 Medical Rec #: [id number]      Accession #:    [id number] Date of Birth: [birth year]       Patient Gender: M Patient Age:   34 years Exam Location:  [facility] Procedure:      VAS [place] LOWER EXTREMITY VENOUS (DVT) Referring Phys: Scheryl Marten XU --------------------------------------------------------------------------------  Indications: Swelling.  Risk Factors: None identified. Comparison Study: No prior study Performing Technologist: Shona Simpson  Examination Guidelines: A complete evaluation includes B-mode imaging,  spectral Doppler, color Doppler, and power Doppler as needed of all accessible portions of each vessel. Bilateral testing is considered an integral part of a complete examination. Limited examinations for reoccurring indications may be performed as noted. The reflux portion of the exam is performed with the patient in reverse Trendelenburg.  +---------+---------------+---------+-----------+----------+--------------+ RIGHT    CompressibilityPhasicitySpontaneityPropertiesThrombus Aging +---------+---------------+---------+-----------+----------+--------------+ CFV      Full           Yes      Yes                                 +---------+---------------+---------+-----------+----------+--------------+ SFJ      Full                                                        +---------+---------------+---------+-----------+----------+--------------+ FV Prox  Full                                                        +---------+---------------+---------+-----------+----------+--------------+ FV Mid   Full                                                        +---------+---------------+---------+-----------+----------+--------------+ FV DistalFull                                                        +---------+---------------+---------+-----------+----------+--------------+ PFV      Full                                                        +---------+---------------+---------+-----------+----------+--------------+  POP      Full           Yes      Yes                                 +---------+---------------+---------+-----------+----------+--------------+ PTV      Full                                                        +---------+---------------+---------+-----------+----------+--------------+ PERO     Full                                                        +---------+---------------+---------+-----------+----------+--------------+    +---------+---------------+---------+-----------+----------+--------------+ LEFT     CompressibilityPhasicitySpontaneityPropertiesThrombus Aging +---------+---------------+---------+-----------+----------+--------------+ CFV      Full           Yes      Yes                                 +---------+---------------+---------+-----------+----------+--------------+ SFJ      Full                                                        +---------+---------------+---------+-----------+----------+--------------+ FV Prox  Full                                                        +---------+---------------+---------+-----------+----------+--------------+ FV Mid   Full                                                        +---------+---------------+---------+-----------+----------+--------------+ FV DistalFull                                                        +---------+---------------+---------+-----------+----------+--------------+ PFV      Full                                                        +---------+---------------+---------+-----------+----------+--------------+ POP      Full           Yes      Yes                                 +---------+---------------+---------+-----------+----------+--------------+  PTV      Full                                                        +---------+---------------+---------+-----------+----------+--------------+ PERO     Full                                                        +---------+---------------+---------+-----------+----------+--------------+     Summary: BILATERAL: - No evidence of deep vein thrombosis seen in the lower extremities, bilaterally. -No evidence of popliteal cyst, bilaterally.   *See table(s) above for measurements and observations. Electronically signed by Lemar Livings MD on 12/02/2022 at 1:46:41 PM.    Final    CT ANGIO HEAD NECK W WO CM  Result Date: 12/01/2022 CLINICAL  DATA:  Right eye vision loss for 45 minutes EXAM: CT ANGIOGRAPHY HEAD AND NECK WITH AND WITHOUT CONTRAST TECHNIQUE: Multidetector CT imaging of the head and neck was performed using the standard protocol during bolus administration of intravenous contrast. Multiplanar CT image reconstructions and MIPs were obtained to evaluate the vascular anatomy. Carotid stenosis measurements (when applicable) are obtained utilizing NASCET criteria, using the distal internal carotid diameter as the denominator. RADIATION DOSE REDUCTION: This exam was performed according to the departmental dose-optimization program which includes automated exposure control, adjustment of the mA and/or kV according to patient size and/or use of iterative reconstruction technique. CONTRAST:  75mL OMNIPAQUE IOHEXOL 350 MG/ML SOLN COMPARISON:  Brain MRI 06/21/2017 FINDINGS: CT HEAD FINDINGS Brain: There is no acute intracranial hemorrhage, extra-axial fluid collection, or acute infarct Parenchymal volume is normal. The ventricles are normal in size. Gray-white differentiation is preserved. There is mineralization in the right caudate and lentiform nucleus, unchanged. The pituitary and suprasellar region are normal. There is no mass lesion. There is no mass effect or midline shift. Vascular: See below. Skull: Normal. Negative for fracture or focal lesion. Sinuses/Orbits: The paranasal sinuses are clear. The globes and orbits are unremarkable. Other: The mastoid air cells and middle ear cavities are clear. Review of the MIP images confirms the above findings CTA NECK FINDINGS Aortic arch: The imaged aortic arch is normal. The origins of the major branch vessels are patent. The subclavian arteries are patent to the level imaged. Right carotid system: The right common carotid artery is patent. The internal carotid artery is occluded from the bifurcation throughout the remainder of the course in the neck. External carotid artery is patent. Left carotid  system: The left common carotid artery is patent with mild irregularity. There is mild plaque at the bifurcation but no hemodynamically significant stenosis or occlusion. There is no evidence of dissection or aneurysm. Vertebral arteries: The vertebral arteries are patent, without hemodynamically significant stenosis or occlusion. There is no evidence of dissection or aneurysm. Skeleton: There is no acute osseous abnormality or suspicious osseous lesion. There is no visible canal hematoma. There is disc space narrowing and degenerative endplate change at C6-C7. Other neck: The patient is status post laryngectomy. A TEP device is noted. Upper chest: The imaged lung apices are clear. Review of the MIP images confirms the above findings CTA HEAD FINDINGS Anterior circulation: There is diminutive reconstitution  of flow in the petrous segment of the intracranial ICA with thready enhancement. There is reconstitution of more normal caliber flow at the communicating segment likely via collateral flow across the anterior and posterior communicating arteries. The right MCA and ACA are patent. The left intracranial ICA is patent. The left MCA and ACAS are patent, without proximal stenosis or occlusion. The anterior communicating artery is normal. There is no aneurysm or AVM. Posterior circulation: The bilateral V4 segments are patent. The basilar artery is patent. The major cerebellar arteries appear patent. The bilateral PCAs are patent, without proximal stenosis or occlusion. A right posterior communicating artery is identified. There is no aneurysm or AVM. Venous sinuses: Patent. Anatomic variants: As above. Review of the MIP images confirms the above findings IMPRESSION: 1. No acute intracranial pathology on initial noncontrast head CT. 2. Unchanged mineralization in the right basal ganglia. 3. Occluded right internal carotid artery from the bifurcation throughout the remainder of its course in the neck with thready  reconstituted flow in the petrous segment, and more normal caliber reconstitution in the communicating segment via collateral flow across the anterior and posterior communicating arteries. Patent anterior and cerebral arteries. This is age-indeterminate but new since 2019. 4. Otherwise patent vasculature in the head and neck with mild plaque at the left carotid bifurcation but no other hemodynamically significant stenosis or occlusion. Findings discussed with Dr. Selina Cooley at 2:11 pm. Electronically Signed   By: Lesia Hausen M.D.   On: 12/01/2022 14:21     Discharge Instructions: Discharge Instructions     Ambulatory referral to Neurology   Complete by: As directed    Follow up with stroke clinic NP at [facility] in about 4 weeks. Thanks.   Ambulatory referral to Ophthalmology   Complete by: As directed    Diet - low sodium heart healthy   Complete by: As directed    Increase activity slowly   Complete by: As directed        Signed: Kristie Cowman, MS3 Pager# [phone] 12/02/2022, 6:04 PM   Please contact the on call pager after 5 pm and on weekends at [phone].   Attestation for Student Documentation:  I personally was present and re-performed the history, physical exam and medical decision-making activities of this service and have verified that the service and findings are accurately documented in the student's note.  Rocky Morel, DO 12/02/2022, 6:04 PM

## 2022-12-02 NOTE — Progress Notes (Addendum)
STROKE TEAM PROGRESS NOTE   SUBJECTIVE (INTERVAL HISTORY) No family is at the bedside.  Overall his condition is completely resolved.  Patient reported right headache and pain behind right ear for 3 to 4 hours and now fully resolved, back to baseline.  He had laryngeal prosthesis for laryngeal cancer which not compatible with MRI.  He had neck radiation in 2018 for cancer treatment.   OBJECTIVE Temp:  [98.3 F (36.8 C)-99.1 F (37.3 C)] 99.1 F (37.3 C) (08/31 1124) Pulse Rate:  [79-92] 89 (08/31 1124) Cardiac Rhythm: Normal sinus rhythm (08/31 0708) Resp:  [13-26] 17 (08/31 1124) BP: (106-186)/(72-111) 152/104 (08/31 1124) SpO2:  [97 %-100 %] 99 % (08/31 1124)  Recent Labs  Lab 12/01/22 1212 12/02/22 0827  GLUCAP 124* 107*   Recent Labs  Lab 12/01/22 1254 12/02/22 0355  NA 138 136  K 3.9 3.4*  CL 104 102  CO2 25 25  GLUCOSE 110* 112*  BUN 14 12  CREATININE 0.98 0.97  CALCIUM 9.0 9.2   Recent Labs  Lab 12/01/22 1254  AST 21  ALT 14  ALKPHOS 51  BILITOT 0.5  PROT 7.3  ALBUMIN 4.4   Recent Labs  Lab 12/01/22 1254  WBC 5.3  NEUTROABS 3.8  HGB 13.3  HCT 40.4  MCV 92.4  PLT 270   No results for input(s): "CKTOTAL", "CKMB", "CKMBINDEX", "TROPONINI" in the last 168 hours. No results for input(s): "LABPROT", "INR" in the last 72 hours. No results for input(s): "COLORURINE", "LABSPEC", "PHURINE", "GLUCOSEU", "HGBUR", "BILIRUBINUR", "KETONESUR", "PROTEINUR", "UROBILINOGEN", "NITRITE", "LEUKOCYTESUR" in the last 72 hours.  Invalid input(s): "APPERANCEUR"     Component Value Date/Time   CHOL 235 (H) 11/08/2022 1053   TRIG 117 11/08/2022 1053   HDL 67 11/08/2022 1053   CHOLHDL 3.5 11/08/2022 1053   CHOLHDL 2.4 01/09/2014 1534   VLDL 10 01/09/2014 1534   LDLCALC 147 (H) 11/08/2022 1053   Lab Results  Component Value Date   HGBA1C 6.2 (H) 12/02/2022   No results found for: "LABOPIA", "COCAINSCRNUR", "LABBENZ", "AMPHETMU", "THCU", "LABBARB"  No results for  input(s): "ETH" in the last 168 hours.  I have personally reviewed the radiological images below and agree with the radiology interpretations.  VAS [place] LOWER EXTREMITY VENOUS (DVT)  Result Date: 12/02/2022  Lower Venous DVT Study Patient Name:  Jonathan [name]  Date of Exam:   12/02/2022 Medical Rec #: [id number]      Accession #:    [id number] Date of Birth: 64-Sep-1962       Patient Gender: M Patient Age:   62 years Exam Location:  [facility] Procedure:      VAS [place] LOWER EXTREMITY VENOUS (DVT) Referring Phys: Scheryl Marten Sole Lengacher --------------------------------------------------------------------------------  Indications: Swelling.  Risk Factors: None identified. Comparison Study: No prior study Performing Technologist: Shona Simpson  Examination Guidelines: A complete evaluation includes B-mode imaging, spectral Doppler, color Doppler, and power Doppler as needed of all accessible portions of each vessel. Bilateral testing is considered an integral part of a complete examination. Limited examinations for reoccurring indications may be performed as noted. The reflux portion of the exam is performed with the patient in reverse Trendelenburg.  +---------+---------------+---------+-----------+----------+--------------+ RIGHT    CompressibilityPhasicitySpontaneityPropertiesThrombus Aging +---------+---------------+---------+-----------+----------+--------------+ CFV      Full           Yes      Yes                                 +---------+---------------+---------+-----------+----------+--------------+  SFJ      Full                                                        +---------+---------------+---------+-----------+----------+--------------+ FV Prox  Full                                                        +---------+---------------+---------+-----------+----------+--------------+ FV Mid   Full                                                         +---------+---------------+---------+-----------+----------+--------------+ FV DistalFull                                                        +---------+---------------+---------+-----------+----------+--------------+ PFV      Full                                                        +---------+---------------+---------+-----------+----------+--------------+ POP      Full           Yes      Yes                                 +---------+---------------+---------+-----------+----------+--------------+ PTV      Full                                                        +---------+---------------+---------+-----------+----------+--------------+ PERO     Full                                                        +---------+---------------+---------+-----------+----------+--------------+   +---------+---------------+---------+-----------+----------+--------------+ LEFT     CompressibilityPhasicitySpontaneityPropertiesThrombus Aging +---------+---------------+---------+-----------+----------+--------------+ CFV      Full           Yes      Yes                                 +---------+---------------+---------+-----------+----------+--------------+ SFJ      Full                                                        +---------+---------------+---------+-----------+----------+--------------+  FV Prox  Full                                                        +---------+---------------+---------+-----------+----------+--------------+ FV Mid   Full                                                        +---------+---------------+---------+-----------+----------+--------------+ FV DistalFull                                                        +---------+---------------+---------+-----------+----------+--------------+ PFV      Full                                                         +---------+---------------+---------+-----------+----------+--------------+ POP      Full           Yes      Yes                                 +---------+---------------+---------+-----------+----------+--------------+ PTV      Full                                                        +---------+---------------+---------+-----------+----------+--------------+ PERO     Full                                                        +---------+---------------+---------+-----------+----------+--------------+     Summary: BILATERAL: - No evidence of deep vein thrombosis seen in the lower extremities, bilaterally. -No evidence of popliteal cyst, bilaterally.   *See table(s) above for measurements and observations.    Preliminary    CT ANGIO HEAD NECK W WO CM  Result Date: 12/01/2022 CLINICAL DATA:  Right eye vision loss for 45 minutes EXAM: CT ANGIOGRAPHY HEAD AND NECK WITH AND WITHOUT CONTRAST TECHNIQUE: Multidetector CT imaging of the head and neck was performed using the standard protocol during bolus administration of intravenous contrast. Multiplanar CT image reconstructions and MIPs were obtained to evaluate the vascular anatomy. Carotid stenosis measurements (when applicable) are obtained utilizing NASCET criteria, using the distal internal carotid diameter as the denominator. RADIATION DOSE REDUCTION: This exam was performed according to the departmental dose-optimization program which includes automated exposure control, adjustment of the mA and/or kV according to patient size and/or use of iterative reconstruction technique. CONTRAST:  75mL OMNIPAQUE IOHEXOL 350 MG/ML SOLN COMPARISON:  Brain MRI 06/21/2017 FINDINGS: CT HEAD  FINDINGS Brain: There is no acute intracranial hemorrhage, extra-axial fluid collection, or acute infarct Parenchymal volume is normal. The ventricles are normal in size. Gray-white differentiation is preserved. There is mineralization in the right caudate and  lentiform nucleus, unchanged. The pituitary and suprasellar region are normal. There is no mass lesion. There is no mass effect or midline shift. Vascular: See below. Skull: Normal. Negative for fracture or focal lesion. Sinuses/Orbits: The paranasal sinuses are clear. The globes and orbits are unremarkable. Other: The mastoid air cells and middle ear cavities are clear. Review of the MIP images confirms the above findings CTA NECK FINDINGS Aortic arch: The imaged aortic arch is normal. The origins of the major branch vessels are patent. The subclavian arteries are patent to the level imaged. Right carotid system: The right common carotid artery is patent. The internal carotid artery is occluded from the bifurcation throughout the remainder of the course in the neck. External carotid artery is patent. Left carotid system: The left common carotid artery is patent with mild irregularity. There is mild plaque at the bifurcation but no hemodynamically significant stenosis or occlusion. There is no evidence of dissection or aneurysm. Vertebral arteries: The vertebral arteries are patent, without hemodynamically significant stenosis or occlusion. There is no evidence of dissection or aneurysm. Skeleton: There is no acute osseous abnormality or suspicious osseous lesion. There is no visible canal hematoma. There is disc space narrowing and degenerative endplate change at C6-C7. Other neck: The patient is status post laryngectomy. A TEP device is noted. Upper chest: The imaged lung apices are clear. Review of the MIP images confirms the above findings CTA HEAD FINDINGS Anterior circulation: There is diminutive reconstitution of flow in the petrous segment of the intracranial ICA with thready enhancement. There is reconstitution of more normal caliber flow at the communicating segment likely via collateral flow across the anterior and posterior communicating arteries. The right MCA and ACA are patent. The left intracranial  ICA is patent. The left MCA and ACAS are patent, without proximal stenosis or occlusion. The anterior communicating artery is normal. There is no aneurysm or AVM. Posterior circulation: The bilateral V4 segments are patent. The basilar artery is patent. The major cerebellar arteries appear patent. The bilateral PCAs are patent, without proximal stenosis or occlusion. A right posterior communicating artery is identified. There is no aneurysm or AVM. Venous sinuses: Patent. Anatomic variants: As above. Review of the MIP images confirms the above findings IMPRESSION: 1. No acute intracranial pathology on initial noncontrast head CT. 2. Unchanged mineralization in the right basal ganglia. 3. 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 Patent anterior and cerebral arteries. This is age-indeterminate but new since 2019. 4. Otherwise patent vasculature in the head and neck with mild plaque at the left carotid bifurcation but no other hemodynamically significant stenosis or occlusion. Findings discussed with Dr. Selina Cooley at 2:11 pm. Electronically Signed   By: Lesia Hausen M.D.   On: 12/01/2022 14:21     PHYSICAL EXAM  Temp:  [98.3 F (36.8 C)-99.1 F (37.3 C)] 99.1 F (37.3 C) (08/31 1124) Pulse Rate:  [79-92] 89 (08/31 1124) Resp:  [13-26] 17 (08/31 1124) BP: (106-186)/(72-111) 152/104 (08/31 1124) SpO2:  [97 %-100 %] 99 % (08/31 1124)  General - Well nourished, well developed, in  no apparent distress.  Ophthalmologic - fundi not visualized due to noncooperation.  Cardiovascular - Regular rhythm and rate.  Mental Status -  Level of arousal and orientation to time, place, and person were intact. Language including expression, naming,  repetition, comprehension was assessed and found intact. Pt has laryngeal prosthesis in place. Fund of Knowledge was assessed and was intact.  Cranial Nerves II - XII - II - Visual field intact OU. III, IV, VI - Extraocular movements intact. V - Facial sensation intact bilaterally. VII - Facial movement intact bilaterally. VIII - Hearing & vestibular intact bilaterally. X - Palate elevates symmetrically. XI - Chin turning & shoulder shrug intact bilaterally. Laryngeal prosthesis in place. XII - Tongue protrusion intact.  Motor Strength - The patient's strength was normal in all extremities and pronator drift was absent.  Bulk was normal and fasciculations were absent.   Motor Tone - Muscle tone was assessed at the neck and appendages and was normal.  Reflexes - The patient's reflexes were symmetrical in all extremities and he had no pathological reflexes.  Sensory - Light touch, temperature/pinprick were assessed and were symmetrical.    Coordination - The patient had normal movements in the hands and feet with no ataxia or dysmetria.  Tremor was absent.  Gait and Station - deferred.   ASSESSMENT/PLAN Jonathan [name] is a 62 y.o. male with history of hypertension, hyperlipidemia, headache, laryngeal cancer status post surgery, chemo and radiation and now with laryngeal prosthesis admitted for right-sided headache, pain behind right ear, right vision loss for 3 to 4 hours. No tPA given due to symptom resolved.    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 Stump emboli?   Episode of HA and right eye pain with right vision loss, lasting 3-4 hours CT no acute finding, unchanged right BG mineralization CTA head and neck R ICA occlusion, recon at petrous portion, new since 2019 MRI not able to perform given his laryngeal prosthesis not compatible with MRI   2D Echo  pending LDL 147 HgbA1c 6.2 UDS positive for  THC Xarelto 10 mg daily for VTE prophylaxis No antithrombotic prior to admission, now on aspirin 81 mg daily and clopidogrel 75 mg daily for 3 months and then aspirin alone given right ICA occlusion Patient counseled to be compliant with his antithrombotic medications Ongoing aggressive stroke risk factor management Therapy recommendations: None Disposition: Pending  Right ICA occlusion Pt had b/l neck radiation around 6-10/2016 CT neck soft tissue in 06/2017 did not show right ICA stenosis or occlusion This time CTA head and neck R ICA occlusion, recon at petrous portion Still concerning that the right ICA occlusion likely related to radiation - more chronic occlusion Pt symptoms may be due to stump emboli Will refer to Dr. Corliss Skains neuro IR for outpt cerebral angiogram. If there is trickle flow, may consider carotid stenting.   Hypertension Stable Long term BP goal normotensive  Hyperlipidemia Home meds: Not compliant with Crestor 40 LDL 147, goal < 70 Now on Crestor 40 Continue statin at discharge  Other Stroke Risk Factors Former cigarette smoker THC abuse, cessation education provided  Other Active Problems Laryngeal cancer status post surgery, chemo and radiation and laryngeal prosthesis  Hospital day # 0  Neurology will sign off. Please call with questions. Pt will follow up with stroke clinic NP at [facility] in about 4 weeks. Thanks for the consult.   Marvel Plan, MD  PhD Stroke Neurology 12/02/2022 1:29 PM    To contact Stroke Continuity provider, please refer to [email or web address]. After hours, contact General Neurology

## 2022-12-02 NOTE — Progress Notes (Signed)
1810H: patient for discharge Medications, appointment and discharge instructions given and explained to the patient. Belongings with patient clothes and shoes.

## 2022-12-02 NOTE — Progress Notes (Signed)
Subjective:   Summary: Jonathan [name] is a 62 y.o. year old male currently admitted on the IMTS HD#0 for suspected Amaurosis Fugax.  Overnight Events: No acute overnight events.   Jonathan [name] is doing well this morning. He has no complaints of pain, vision changes or neurological deficits. He is curious to know what caused this, but is ready to go home.   Objective:  Vital signs in last 24 hours: Vitals:   12/02/22 0209 12/02/22 0305 12/02/22 0525 12/02/22 1124  BP: 120/77  106/72 (!) 152/104  Pulse: 80 83 79 89  Resp: 18 17 16 17   Temp: 98.9 F (37.2 C)  98.6 F (37 C) 99.1 F (37.3 C)  TempSrc: Oral  Oral Oral  SpO2: 97%  99% 99%  Weight:      Height:       Supplemental O2: Room Air SpO2: 99 %   Physical Exam:  Constitutional: well-appearing sitting in bed, in no acute distress HEENT: Pupils equal and reactive to light. EOMI. No tenderness on palpation of the eyes. Cardiovascular: RRR, no murmurs, rubs or gallops Pulmonary/Chest: normal work of breathing on room air, lungs clear to auscultation bilaterally Abdominal: soft, non-tender, non-distended Skin: warm and dry Extremities:  no lower extremity edema present Neuro: Alert and oriented x 3. Pupils equal and reactive to light. EOM intact. No ptosis. Visual fields preserved. No facial droop. Facial sensation intact and equal bilaterally. Tongue and uvula midline;no deviations with protrusion. Tone is normal. Upper and lower strength 5/5 bilaterally. Sensation intact bilaterally. Finger to nose intact. No romberg. Heel to shin intact.   Filed Weights   12/01/22 1211  Weight: 59.4 kg    No intake or output data in the 24 hours ending 12/02/22 1243 Net IO Since Admission: No IO data has been entered for this period [12/02/22 1243]  Pertinent Labs:    Latest Ref Rng & Units 12/01/2022   12:54 PM 05/24/2022   10:18 AM 06/30/2020   10:07 AM  CBC  WBC 4.0 - 10.5 K/uL 5.3  4.3  5.1   Hemoglobin 13.0 -  17.0 g/dL 16.1  09.6  04.5   Hematocrit 39.0 - 52.0 % 40.4  43.7  49.0   Platelets 150 - 400 K/uL 270  331  374        Latest Ref Rng & Units 12/02/2022    3:55 AM 12/01/2022   12:54 PM 05/24/2022   10:18 AM  CMP  Glucose 70 - 99 mg/dL 409  811  81   BUN 8 - 23 mg/dL 12  14  11    Creatinine 0.61 - 1.24 mg/dL 9.14  7.82  9.56   Sodium 135 - 145 mmol/L 136  138  145   Potassium 3.5 - 5.1 mmol/L 3.4  3.9  4.5   Chloride 98 - 111 mmol/L 102  104  105   CO2 22 - 32 mmol/L 25  25  20    Calcium 8.9 - 10.3 mg/dL 9.2  9.0  9.7   Total Protein 6.5 - 8.1 g/dL  7.3  7.9   Total Bilirubin 0.3 - 1.2 mg/dL  0.5  0.3   Alkaline Phos 38 - 126 U/L  51  86   AST 15 - 41 U/L  21  29   ALT 0 - 44 U/L  14  13     I personally reviewed interval labs  and pertinent results include: K 3.4  Imaging: VAS [place] LOWER EXTREMITY VENOUS (DVT)  Result Date: 12/02/2022  Lower Venous DVT Study Patient Name:  Jonathan [name]  Date of Exam:   12/02/2022 Medical Rec #: [id number]      Accession #:    [id number] Date of Birth: Oct 30, 1960       Patient Gender: M Patient Age:   23 years Exam Location:  [facility] Procedure:      VAS [place] LOWER EXTREMITY VENOUS (DVT) Referring Phys: Scheryl Marten XU --------------------------------------------------------------------------------  Indications: Swelling.  Risk Factors: None identified. Comparison Study: No prior study Performing Technologist: Shona Simpson  Examination Guidelines: A complete evaluation includes B-mode imaging, spectral Doppler, color Doppler, and power Doppler as needed of all accessible portions of each vessel. Bilateral testing is considered an integral part of a complete examination. Limited examinations for reoccurring indications may be performed as noted. The reflux portion of the exam is performed with the patient in reverse Trendelenburg.  +---------+---------------+---------+-----------+----------+--------------+ RIGHT     CompressibilityPhasicitySpontaneityPropertiesThrombus Aging +---------+---------------+---------+-----------+----------+--------------+ CFV      Full           Yes      Yes                                 +---------+---------------+---------+-----------+----------+--------------+ SFJ      Full                                                        +---------+---------------+---------+-----------+----------+--------------+ FV Prox  Full                                                        +---------+---------------+---------+-----------+----------+--------------+ FV Mid   Full                                                        +---------+---------------+---------+-----------+----------+--------------+ FV DistalFull                                                        +---------+---------------+---------+-----------+----------+--------------+ PFV      Full                                                        +---------+---------------+---------+-----------+----------+--------------+ POP      Full           Yes      Yes                                 +---------+---------------+---------+-----------+----------+--------------+ PTV  Full                                                        +---------+---------------+---------+-----------+----------+--------------+ PERO     Full                                                        +---------+---------------+---------+-----------+----------+--------------+   +---------+---------------+---------+-----------+----------+--------------+ LEFT     CompressibilityPhasicitySpontaneityPropertiesThrombus Aging +---------+---------------+---------+-----------+----------+--------------+ CFV      Full           Yes      Yes                                 +---------+---------------+---------+-----------+----------+--------------+ SFJ      Full                                                         +---------+---------------+---------+-----------+----------+--------------+ FV Prox  Full                                                        +---------+---------------+---------+-----------+----------+--------------+ FV Mid   Full                                                        +---------+---------------+---------+-----------+----------+--------------+ FV DistalFull                                                        +---------+---------------+---------+-----------+----------+--------------+ PFV      Full                                                        +---------+---------------+---------+-----------+----------+--------------+ POP      Full           Yes      Yes                                 +---------+---------------+---------+-----------+----------+--------------+ PTV      Full                                                        +---------+---------------+---------+-----------+----------+--------------+  PERO     Full                                                        +---------+---------------+---------+-----------+----------+--------------+     Summary: BILATERAL: - No evidence of deep vein thrombosis seen in the lower extremities, bilaterally. -No evidence of popliteal cyst, bilaterally.   *See table(s) above for measurements and observations.    Preliminary    CT ANGIO HEAD NECK W WO CM  Result Date: 12/01/2022 CLINICAL DATA:  Right eye vision loss for 45 minutes EXAM: CT ANGIOGRAPHY HEAD AND NECK WITH AND WITHOUT CONTRAST TECHNIQUE: Multidetector CT imaging of the head and neck was performed using the standard protocol during bolus administration of intravenous contrast. Multiplanar CT image reconstructions and MIPs were obtained to evaluate the vascular anatomy. Carotid stenosis measurements (when applicable) are obtained utilizing NASCET criteria, using the distal internal carotid diameter as the  denominator. RADIATION DOSE REDUCTION: This exam was performed according to the departmental dose-optimization program which includes automated exposure control, adjustment of the mA and/or kV according to patient size and/or use of iterative reconstruction technique. CONTRAST:  75mL OMNIPAQUE IOHEXOL 350 MG/ML SOLN COMPARISON:  Brain MRI 06/21/2017 FINDINGS: CT HEAD FINDINGS Brain: There is no acute intracranial hemorrhage, extra-axial fluid collection, or acute infarct Parenchymal volume is normal. The ventricles are normal in size. Gray-white differentiation is preserved. There is mineralization in the right caudate and lentiform nucleus, unchanged. The pituitary and suprasellar region are normal. There is no mass lesion. There is no mass effect or midline shift. Vascular: See below. Skull: Normal. Negative for fracture or focal lesion. Sinuses/Orbits: The paranasal sinuses are clear. The globes and orbits are unremarkable. Other: The mastoid air cells and middle ear cavities are clear. Review of the MIP images confirms the above findings CTA NECK FINDINGS Aortic arch: The imaged aortic arch is normal. The origins of the major branch vessels are patent. The subclavian arteries are patent to the level imaged. Right carotid system: The right common carotid artery is patent. The internal carotid artery is occluded from the bifurcation throughout the remainder of the course in the neck. External carotid artery is patent. Left carotid system: The left common carotid artery is patent with mild irregularity. There is mild plaque at the bifurcation but no hemodynamically significant stenosis or occlusion. There is no evidence of dissection or aneurysm. Vertebral arteries: The vertebral arteries are patent, without hemodynamically significant stenosis or occlusion. There is no evidence of dissection or aneurysm. Skeleton: There is no acute osseous abnormality or suspicious osseous lesion. There is no visible canal  hematoma. There is disc space narrowing and degenerative endplate change at C6-C7. Other neck: The patient is status post laryngectomy. A TEP device is noted. Upper chest: The imaged lung apices are clear. Review of the MIP images confirms the above findings CTA HEAD FINDINGS Anterior circulation: There is diminutive reconstitution of flow in the petrous segment of the intracranial ICA with thready enhancement. There is reconstitution of more normal caliber flow at the communicating segment likely via collateral flow across the anterior and posterior communicating arteries. The right MCA and ACA are patent. The left intracranial ICA is patent. The left MCA and ACAS are patent, without proximal stenosis or occlusion. The anterior communicating artery is normal. There is no aneurysm or AVM. Posterior  circulation: The bilateral V4 segments are patent. The basilar artery is patent. The major cerebellar arteries appear patent. The bilateral PCAs are patent, without proximal stenosis or occlusion. A right posterior communicating artery is identified. There is no aneurysm or AVM. Venous sinuses: Patent. Anatomic variants: As above. Review of the MIP images confirms the above findings IMPRESSION: 1. No acute intracranial pathology on initial noncontrast head CT. 2. Unchanged mineralization in the right basal ganglia. 3. 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 Patent anterior and cerebral arteries. This is age-indeterminate but new since 2019. 4. Otherwise patent vasculature in the head and neck with mild plaque at the left carotid bifurcation but no other hemodynamically significant stenosis or occlusion. Findings discussed with Dr. Selina Cooley at 2:11 pm.  Electronically Signed   By: Lesia Hausen M.D.   On: 12/01/2022 14:21   I personally reviewed interval imaging and my interpretation is as follows: No presence of LE DVT bilaterally.   EKG: My EKG interpretation is as follows: no new EKG  Assessment/Plan:   Principal Problem:   Amaurosis fugax of right eye Active Problems:   Transient monocular blindness, right   Patient Summary: Jonathan [name] is a 62 y.o. 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    #Acute, painful, transient vision loss Transient Ischemia v Amaurosis Fugax Patient presented with acute, painful, transient vision loss of the right eye which resolved within 3 hours. 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 He has had no eye pain, focal neuro deficits or changes in vision since it resolved. He has been up and walking around. His vitals remain stable, with permissive hypertension. Lower extremity venous doppler showed no evidence of DVT bilaterally. Patients vision loss is likely due to a transient ischemia either from an episode of hypotension, a microemboli or a TIA. Less likely amaurosis fugax due to the pain associated with the vision loss. Neurology consulted and recommended dual-antiplatelet therapy with ASA and Plavix as well as an MRI.  -Neurology consulted-recommended DAPT with ASA and Plavix. They will schedule follow-up for patient in their clinic and schedule an outpatient cerebral angiogram.  -Ophthalmology will see patient outpatient in 1 week -MRI without contrast will not be conducted. Neurology is okay with outpatient cerebral angiogram. Patients voice box has a magnet and is not compatible with MRI.  -Echo with bubble study today  -Continue ASA 81mg   -Started  Plavix 75mg  daily -Continue Rosuvastatin 40mg   -Continue telemetry    #HTN Patient's home regimen is amlodipine-olmesartan. Documented history of white coat hypertension. Majority of blood pressures have been normotensive. Recent BP of 152/104. Medication is being held to allow for permissive hypertension.  #HLD Patients home regimen is rosuvastatin 40mg . Patient reports he has been taking regularly at night. Records show last refill was in July 2023.  Recent lipid panel (8/8) showed total cholesterol 235 and LDL 147.  -Continue rosuvastatin 40mg     #Post-radiation hypothyroidism Patient's home regimen is Synthroid 112 mcg. Recent (8/8) TSH was 4.14.  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 [name]. He passed bedside swallow study and is able to perform trach care.  -Patient needs to schedule appointment after discharge   Diet: Normal IVF: None,None VTE: NOAC Code: Full PT/OT recs: None, none. TOC recs: None Family Update:   Dispo: Anticipated discharge to Home in 1 days pending completion of TIA workup.   Kristie Cowman, MS3 Pager Number [phone] Please contact the on call pager after 5 pm and on weekends at [phone].

## 2022-12-02 NOTE — Discharge Instructions (Addendum)
Jonathan [name],  You were recently admitted to [facility] for transient vision loss of your right eye likely due to a microemboli from occluded right internal carotid.  Continue taking your home medications with the following changes  Start taking:  ASA 81mg  daily  Plavix  Continue taking: Crestor 40mg  daily  Synthroid 112 mcg daily  Amlodipine-olmesartan 10-20mg  daily  Prilosec 20mg  daily    You should seek further medical care if you have vision loss, confusion, weakness in one extremity or slurred speech.  We recommend that you see your primary care doctor in about a week to make sure that you continue to improve. We are so glad that you are feeling better.  Sincerely, Kristie Cowman

## 2022-12-02 NOTE — Progress Notes (Signed)
Lower extremity venous duplex completed. Please see CV Procedures for preliminary results.  Shona Simpson, RVT 12/02/22 11:23 AM

## 2022-12-03 LAB — ECHOCARDIOGRAM COMPLETE BUBBLE STUDY
AR max vel: 2.49 cm2
AV Area VTI: 2.51 cm2
AV Area mean vel: 2.42 cm2
AV Mean grad: 3 mmHg
AV Peak grad: 5.1 mmHg
Ao pk vel: 1.13 m/s
Area-P 1/2: 3.74 cm2
S' Lateral: 2.4 cm

## 2022-12-06 ENCOUNTER — Other Ambulatory Visit (HOSPITAL_COMMUNITY): Payer: Self-pay | Admitting: Interventional Radiology

## 2022-12-06 DIAGNOSIS — I6521 Occlusion and stenosis of right carotid artery: Secondary | ICD-10-CM

## 2022-12-11 ENCOUNTER — Encounter: Payer: Self-pay | Admitting: Student

## 2022-12-11 ENCOUNTER — Ambulatory Visit (INDEPENDENT_AMBULATORY_CARE_PROVIDER_SITE_OTHER): Payer: Medicaid Other | Admitting: Student

## 2022-12-11 VITALS — BP 132/80 | HR 92 | Temp 98.2°F | Ht 65.0 in | Wt 134.5 lb

## 2022-12-11 DIAGNOSIS — E785 Hyperlipidemia, unspecified: Secondary | ICD-10-CM

## 2022-12-11 DIAGNOSIS — Z23 Encounter for immunization: Secondary | ICD-10-CM | POA: Diagnosis not present

## 2022-12-11 DIAGNOSIS — R7303 Prediabetes: Secondary | ICD-10-CM | POA: Insufficient documentation

## 2022-12-11 DIAGNOSIS — I6521 Occlusion and stenosis of right carotid artery: Secondary | ICD-10-CM | POA: Diagnosis not present

## 2022-12-11 DIAGNOSIS — E7841 Elevated Lipoprotein(a): Secondary | ICD-10-CM

## 2022-12-11 NOTE — Progress Notes (Deleted)
This is a Psychologist, occupational Note.  The care of the patient was discussed with Dr. Marland Kitchen and the assessment and plan was formulated with their assistance.  Please see their note for official documentation of the patient encounter.   Subjective:   Patient ID: Jonathan [name] male   DOB: Mar 01, 1961 62 y.o.   MRN: [id number]  HPI: [name] is a 62 y.o.   Vision:    R ICA occlusion Hyperlipidemia, adherence to crestor On dapt for 3 months then aspirin alone Non-fasting LDL Med changes: asa and plavix  Takes all medications as prescribed:  Azor Aspirin Plavix Synthroid Prilosec crestor  Neuro:  CN exam (light, extraocular movements, periphery, facial sensation, smile, puff cheeks, hearing, say ah tongue and uvula, shoulder shrug, head turn) Arm strength Leg strength  Adherence yes Question about returning to work/head movements provoking occlusion Exam normal  For the details of today's visit, please refer to the assessment and plan.     Past Medical History:  Diagnosis Date   Allergic rhinitis    Arthritis    Bilateral knee arthritis for about 20 years. Not taking any medications, believes in nature processes   Colon polyp    Dental caries    prechemoradiation therapy, periodontitis   Elevated blood pressure reading    White Coat Hypertension   GERD (gastroesophageal reflux disease)    Headache    History of radiation therapy 09/11/16- 10/30/16   Larynx 70 Gy in 35 fractions   HLD (hyperlipidemia)    Hypothyroidism    radiation induced hypothyroidism   Internal hemorrhoids    Laryngeal cancer (HCC)    Pneumonia    as a child   Tubular adenoma of colon    Current Outpatient Medications  Medication Sig Dispense Refill   acetaminophen (TYLENOL) 500 MG tablet Take 1,000 mg by mouth every 6 (six) hours as needed for moderate pain or headache.     amlodipine-olmesartan (AZOR) 10-20 MG tablet TAKE 1 TABLET BY MOUTH DAILY 90 tablet 3   aspirin EC 81 MG tablet  Take 1 tablet (81 mg total) by mouth daily. Swallow whole. 90 tablet 3   clopidogrel (PLAVIX) 75 MG tablet Take 1 tablet (75 mg total) by mouth daily. 90 tablet 3   levothyroxine (SYNTHROID) 112 MCG tablet TAKE 1 TABLET(112 MCG) BY MOUTH DAILY (Patient not taking: Reported on 12/02/2022) 90 tablet 1   omeprazole (PRILOSEC) 20 MG capsule Take 1 capsule (20 mg total) by mouth daily. 90 capsule 1   rosuvastatin (CRESTOR) 40 MG tablet TAKE 1 TABLET(40 MG) BY MOUTH DAILY (Patient not taking: Reported on 12/02/2022) 30 tablet 11   No current facility-administered medications for this visit.   Family History  Problem Relation Age of Onset   Prostate cancer Maternal Uncle 66       On radiation therapy now, diagnosed in around June 2012   Colon cancer Maternal Uncle    Heart attack Paternal Grandfather        Diet due to MI   Lung cancer Paternal Aunt    Brain cancer Paternal Aunt    Kidney disease Maternal Uncle    Hypertension Mother    CAD Father    Colon polyps Neg Hx    Diabetes Neg Hx    Gallbladder disease Neg Hx    Esophageal cancer Neg Hx    Social History   Socioeconomic History   Marital status: Single    Spouse name: Not on file  Number of children: 4   Years of education: Not on file   Highest education level: Not on file  Occupational History   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   Drug use: No    Frequency: 2.0 times per week    Comment: denies   Sexual activity: Yes    Partners: Female  Other Topics Concern   Not on file  Social History Narrative   Lives in [place] with  his fiance.   Moved to [place] 11 years before. Used to live in [place] for 20 years before that.   Has 4 healthy kids from previous wife- separated now.   Is not working full-time since last 2 and half years- since early 2010.   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   Social Determinants of Health   Financial Resource Strain: Low Risk  (05/24/2022)   Overall Financial Resource Strain (CARDIA)    Difficulty of Paying Living Expenses: Not very hard  Food Insecurity: No Food Insecurity (12/01/2022)   Hunger Vital Sign    Worried About Running Out of Food in the Last Year: Never true    Ran Out of Food in the Last Year: Never true  Transportation Needs: No Transportation Needs (12/01/2022)   PRAPARE - Administrator, Civil Service (Medical): No    Lack of Transportation (Non-Medical): No  Physical Activity: Sufficiently Active (05/24/2022)   Exercise Vital Sign    Days of Exercise per Week: 7 days    Minutes of Exercise per Session: 40 min  Stress: No Stress Concern Present (05/24/2022)   [employer] of Occupational Health - Occupational Stress Questionnaire    Feeling of Stress : Only a little  Social Connections: Moderately Integrated (05/24/2022)   Social Connection and Isolation Panel [NHANES]    Frequency of Communication with Friends and Family: More than three times a week    Frequency of Social Gatherings with Friends and Family: More than three times a week    Attends Religious Services: More than 4 times per year    Active Member of [employer] or Organizations: No    Attends Banker Meetings: Never    Marital Status: Living with partner   Review of Systems: {Review Of Systems:30496} Objective:  Physical Exam: Vitals:   12/11/22 1405  BP: 132/80  Pulse: 92  Temp: 98.2 F (36.8 C)  TempSrc: Oral  SpO2: 97%  Weight: 134 lb 8 oz (61 kg)  Height: 5\' 5"  (1.651 m)    Constitutional: NAD, appears  comfortable HEENT: Atraumatic, normocephalic. PERRL, anicteric sclera.  Neck: Supple, trachea midline.  Cardiovascular: RRR, no murmurs, rubs, or gallops.  Pulmonary/Chest: CTAB, no wheezes, rales, or rhonchi. No chest wall abnormalities.  Abdominal: Soft, non tender, non distended. +BS.  GU: ***  Extremities: Warm and well perfused. Distal pulses intact. No edema.  Neurological: A&Ox3, CN II - XII grossly intact.  Skin: No rashes or erythema  Psychiatric: Normal mood and affect  Assessment &  Plan:   No problem-specific Assessment & Plan notes found for this encounter.

## 2022-12-11 NOTE — Assessment & Plan Note (Signed)
Patient was recently hospitalized for right internal carotid artery occlusion.  Patient has since improved well.  On my exam today, there are no focal neurological deficits appreciated.  Patient vision is back to normal.  Equal ocular motion intact.  Cranial nerves II through XII intact.  Patient has to follow-up with neuro IR for potential carotid artery stenting.  Patient also has a follow-up with neurology.  Appointments have been made.  Patient was to follow-up with ophthalmology, but patient was unable to schedule appointment.  Will refer patient today to ophthalmology.  Plan: -Continue aspirin and Plavix for 3 months followed by aspirin alone starting at the end of November -Continue high intensity statin with Crestor 40 mg daily -Follow-up with neuro IR -Follow-up with neurology -Patient referred to ophthalmology

## 2022-12-11 NOTE — Assessment & Plan Note (Signed)
Flu vaccine administered today.

## 2022-12-11 NOTE — Progress Notes (Signed)
CC: Hospital follow up  HPI:  [name] is a 62 y.o. male with a past medical history of hypothyroidism, history of malignant neoplasm of supraglottis presenting for hospital follow-up.  Please see assessment and plan for full HPI  Patient was recently admitted to the hospital for right carotid occlusion. Patient was admitted for stroke work up form 08/30-08/31  Patient was discharged on asa and plavix for 3 months followed by aspirin alone.  Medications:  HLD: Crestor 40  GERD: prilosec 20 mg daily  CVA: Plavix 75 mg daily, aspirin 81 mg daily  HTN: Amlodipine-olmesartan 10-20 mg daily    Past Medical History:  Diagnosis Date   Allergic rhinitis    Arthritis    Bilateral knee arthritis for about 20 years. Not taking any medications, believes in nature processes   Colon polyp    Dental caries    prechemoradiation therapy, periodontitis   Elevated blood pressure reading    White Coat Hypertension   GERD (gastroesophageal reflux disease)    Headache    History of radiation therapy 09/11/16- 10/30/16   Larynx 70 Gy in 35 fractions   HLD (hyperlipidemia)    Hypothyroidism    radiation induced hypothyroidism   Internal hemorrhoids    Laryngeal cancer (HCC)    Pneumonia    as a child   Tubular adenoma of colon      Current Outpatient Medications:    acetaminophen (TYLENOL) 500 MG tablet, Take 1,000 mg by mouth every 6 (six) hours as needed for moderate pain or headache., Disp: , Rfl:    amlodipine-olmesartan (AZOR) 10-20 MG tablet, TAKE 1 TABLET BY MOUTH DAILY, Disp: 90 tablet, Rfl: 3   aspirin EC 81 MG tablet, Take 1 tablet (81 mg total) by mouth daily. Swallow whole., Disp: 90 tablet, Rfl: 3   clopidogrel (PLAVIX) 75 MG tablet, Take 1 tablet (75 mg total) by mouth daily., Disp: 90 tablet, Rfl: 3   levothyroxine (SYNTHROID) 112 MCG tablet, TAKE 1 TABLET(112 MCG) BY MOUTH DAILY (Patient not taking: Reported on 12/02/2022), Disp: 90 tablet, Rfl: 1   omeprazole  (PRILOSEC) 20 MG capsule, Take 1 capsule (20 mg total) by mouth daily., Disp: 90 capsule, Rfl: 1   rosuvastatin (CRESTOR) 40 MG tablet, TAKE 1 TABLET(40 MG) BY MOUTH DAILY (Patient not taking: Reported on 12/02/2022), Disp: 30 tablet, Rfl: 11  Review of Systems:    Negative except for what is stated in HPI  Physical Exam:  Vitals:   12/11/22 1405  BP: 132/80  Pulse: 92  Temp: 98.2 F (36.8 C)  TempSrc: Oral  SpO2: 97%  Weight: 134 lb 8 oz (61 kg)  Height: 5\' 5"  (1.651 m)    General: Patient is sitting comfortably in the room  Head: Normocephalic, atraumatic  Cardio: Regular rate and rhythm, no murmurs, rubs or gallops Pulmonary: Clear to ausculation bilaterally with no rales, rhonchi, and crackles  Neuro: No focal neurological deficits. CN II-XII intact. Sensation intact to upper and lower extremities.   Assessment & Plan:   Prediabetes Patient recently had A1c in hospital which was 6.2.  This puts him in the prediabetic category.  Will treat with lifestyle modifications with diet and exercise.  Plan: -Patient encouraged to exercise and diet  HLD (hyperlipidemia) Patient has a past medical history of hyperlipidemia.  Patient had lipid panel obtained in hospital which showed elevated total cholesterol and elevated LDL.  Given patient had concern for stroke, this will now be secondary prevention.  LDL goal  will be less than 70.  At this time, patient not at goal.  Plan: -Continue Crestor 40 mg daily -Encourage patient to remain adherent -Can recheck lipid panel in 3 to 6 months  ICAO (internal carotid artery occlusion), right Patient was recently hospitalized for right internal carotid artery occlusion.  Patient has since improved well.  On my exam today, there are no focal neurological deficits appreciated.  Patient vision is back to normal.  Equal ocular motion intact.  Cranial nerves II through XII intact.  Patient has to follow-up with neuro IR for potential carotid  artery stenting.  Patient also has a follow-up with neurology.  Appointments have been made.  Patient was to follow-up with ophthalmology, but patient was unable to schedule appointment.  Will refer patient today to ophthalmology.  Plan: -Continue aspirin and Plavix for 3 months followed by aspirin alone starting at the end of November -Continue high intensity statin with Crestor 40 mg daily -Follow-up with neuro IR -Follow-up with neurology -Patient referred to ophthalmology  Flu vaccine need Flu vaccine administered today.   Patient was evaluated and examined by medical student.  I independently conducted my own history and physical exam.  Assessment and plan created together with medical student and I.  Patient discussed with Dr. Alphonsus Sias, DO PGY-2 Internal Medicine Resident  Pager: [phone]

## 2022-12-11 NOTE — Assessment & Plan Note (Signed)
Patient has a past medical history of hyperlipidemia.  Patient had lipid panel obtained in hospital which showed elevated total cholesterol and elevated LDL.  Given patient had concern for stroke, this will now be secondary prevention.  LDL goal will be less than 70.  At this time, patient not at goal.  Plan: -Continue Crestor 40 mg daily -Encourage patient to remain adherent -Can recheck lipid panel in 3 to 6 months

## 2022-12-11 NOTE — Assessment & Plan Note (Signed)
Patient recently had A1c in hospital which was 6.2.  This puts him in the prediabetic category.  Will treat with lifestyle modifications with diet and exercise.  Plan: -Patient encouraged to exercise and diet

## 2022-12-11 NOTE — Patient Instructions (Addendum)
Jonathan [name] you for allowing me to take part in your care today.  Here are your instructions.  1.  Regarding your symptoms regarding the possible stroke, continue taking aspirin Plavix daily.  Do not miss any doses.  Continue taking atorvastatin 40 mg as well. Do not miss any doses.  You are cleared to go back to work.  2.  We updated your flu vaccine today.  You may feel sore.  If you develop a fever, please take some Tylenol.  3.  Will see back in 3 months, at that time we can follow-up on your labs as well.  We can check on your cholesterol at that time.  4.  If you have any concerns, you can always come back earlier and we can address those concerns.  Thank you, Dr. Bess Kinds Daneshvar MS2  If you have any other questions please contact the internal medicine clinic at [phone] If it is after hours, please call the [place] hospital at [phone] and then ask the person who picks up for the resident on call.

## 2022-12-15 ENCOUNTER — Ambulatory Visit (HOSPITAL_COMMUNITY)
Admission: RE | Admit: 2022-12-15 | Discharge: 2022-12-15 | Disposition: A | Discharge status: Home / Self Care | Payer: Medicaid Other | Source: Ambulatory Visit | Attending: Interventional Radiology | Admitting: Interventional Radiology

## 2022-12-15 DIAGNOSIS — I6521 Occlusion and stenosis of right carotid artery: Secondary | ICD-10-CM

## 2022-12-18 HISTORY — PX: IR RADIOLOGIST EVAL & MGMT: IMG5224

## 2022-12-23 NOTE — Progress Notes (Signed)
Internal Medicine Clinic Attending  Case discussed with the resident at the time of the visit.  We reviewed the resident's history and exam and pertinent patient test results.  I agree with the assessment, diagnosis, and plan of care documented in the resident's note.  Debe Coder, MD

## 2022-12-26 ENCOUNTER — Other Ambulatory Visit (HOSPITAL_COMMUNITY): Payer: Self-pay | Admitting: Interventional Radiology

## 2022-12-26 ENCOUNTER — Telehealth (HOSPITAL_COMMUNITY): Payer: Self-pay

## 2022-12-26 DIAGNOSIS — I6521 Occlusion and stenosis of right carotid artery: Secondary | ICD-10-CM

## 2022-12-26 NOTE — Telephone Encounter (Signed)
Called to schedule diagnostic angiogram, no answer, left vm. AB

## 2022-12-31 ENCOUNTER — Other Ambulatory Visit: Payer: Self-pay | Admitting: Internal Medicine

## 2022-12-31 DIAGNOSIS — E785 Hyperlipidemia, unspecified: Secondary | ICD-10-CM

## 2022-12-31 NOTE — Progress Notes (Unsigned)
PATIENT: Jonathan [name] DOB: 03/31/1961  REASON FOR VISIT: follow up HISTORY FROM: patient PRIMARY NEUROLOGIST: Dr. Roda Shutters  No chief complaint on file.    HISTORY OF PRESENT ILLNESS: Today 12/31/22  Jonathan [name] is a 62 y.o. male who has been followed in this office for Right amaurosis fugax likely d/t right ICA occlusion. Returns today for follow-up.   Not able to do MRI d/t laryngeal prosthesis  IR procedure 10/4  HISTORY [name] is a 62 y.o. male with history of hypertension, hyperlipidemia, headache, laryngeal cancer status post surgery, chemo and radiation and now with laryngeal prosthesis admitted for right-sided headache, pain behind right ear, right vision loss for 3 to 4 hours. No tPA given due to symptom resolved.     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 Stump emboli?   Episode of HA and right eye pain with right vision loss, lasting 3-4 hours CT no acute finding, unchanged right BG mineralization CTA head and neck R ICA occlusion, recon at petrous portion, new since 2019 MRI not able to perform given his laryngeal prosthesis not compatible with MRI   2D Echo  pending LDL 147 HgbA1c 6.2 UDS positive for THC Xarelto 10 mg daily for VTE prophylaxis No antithrombotic prior to admission, now on aspirin 81 mg daily and clopidogrel 75 mg daily for 3 months and then aspirin alone given right ICA occlusion Patient counseled to be compliant with his antithrombotic medications Ongoing aggressive stroke risk factor management Therapy recommendations: None Disposition: Pending   Right ICA occlusion Pt had b/l neck radiation around 6-10/2016 CT neck soft tissue in 06/2017 did not show right ICA stenosis or occlusion This time CTA head and neck R ICA occlusion, recon at petrous portion Still concerning that the right ICA occlusion likely related to radiation - more chronic occlusion Pt symptoms may  be due to stump emboli Will refer to Dr. Corliss Skains neuro IR for outpt cerebral angiogram. If there is trickle flow, may consider carotid stenting.   REVIEW OF SYSTEMS: Out of a complete 14 system review of symptoms, the patient complains only of the following symptoms, and all other reviewed systems are negative.  ALLERGIES: Allergies  Allergen Reactions   Other Rash    Oranges and tomatoes    HOME MEDICATIONS: Outpatient Medications Prior to Visit  Medication Sig Dispense Refill   acetaminophen (TYLENOL) 500 MG tablet Take 1,000 mg by mouth every 6 (six) hours as needed for moderate pain or headache.     amlodipine-olmesartan (AZOR) 10-20 MG tablet TAKE 1 TABLET BY MOUTH DAILY 90 tablet 3   aspirin EC 81 MG tablet Take 1 tablet (81 mg total) by mouth daily. Swallow whole. 90 tablet 3   clopidogrel (PLAVIX) 75 MG tablet Take 1 tablet (75 mg total) by mouth daily. 90 tablet 3   levothyroxine (SYNTHROID) 112 MCG tablet TAKE 1 TABLET(112 MCG) BY MOUTH DAILY (Patient not taking: Reported on 12/02/2022) 90 tablet 1   omeprazole (PRILOSEC) 20 MG capsule Take 1 capsule (20 mg total) by mouth daily. 90 capsule 1   rosuvastatin (CRESTOR) 40 MG tablet TAKE 1 TABLET(40 MG) BY MOUTH DAILY (Patient not taking: Reported on 12/02/2022) 30 tablet 11   No facility-administered medications prior to visit.    PAST MEDICAL HISTORY: Past Medical History:  Diagnosis Date   Allergic rhinitis    Arthritis    Bilateral knee arthritis  for about 20 years. Not taking any medications, believes in nature processes   Colon polyp    Dental caries    prechemoradiation therapy, periodontitis   Elevated blood pressure reading    White Coat Hypertension   GERD (gastroesophageal reflux disease)    Headache    History of radiation therapy 09/11/16- 10/30/16   Larynx 70 Gy in 35 fractions   HLD (hyperlipidemia)    Hypothyroidism    radiation induced hypothyroidism   Internal hemorrhoids    Laryngeal cancer (HCC)     Pneumonia    as a child   Tubular adenoma of colon     PAST SURGICAL HISTORY: Past Surgical History:  Procedure Laterality Date   COLONOSCOPY  03/18/2012   DIRECT LARYNGOSCOPY N/A 07/06/2017   Procedure: DIRECT LARYNGOSCOPY;  Surgeon: Serena Colonel, MD;  Location: [facility] OR;  Service: ENT;  Laterality: N/A;   ESOPHAGOSCOPY  04/24/2016   Procedure: ESOPHAGOSCOPY;  Surgeon: Serena Colonel, MD;  Location: [facility] OR;  Service: ENT;;   EVALUATION UNDER ANESTHESIA WITH HEMORRHOIDECTOMY N/A 05/15/2019   Procedure: ANORECTAL EXAM UNDER ANESTHESIA WITH EXCISION; HEMORRHOIDECTOMY, FULGURATION OF FLAT CONDYLOMA;  Surgeon: Andria Meuse, MD;  Location: [facility] ORS;  Service: General;  Laterality: N/A;   IR GENERIC HISTORICAL  06/02/2016   IR US GUIDE VASC ACCESS RIGHT 06/02/2016 Simonne Come, MD [facility]   IR GENERIC HISTORICAL  06/02/2016   IR FLUORO GUIDE PORT INSERTION RIGHT 06/02/2016 Simonne Come, MD [facility]   IR RADIOLOGIST EVAL & MGMT  12/18/2022   IR REMOVAL TUN ACCESS W/ PORT W/O FL MOD SED  04/16/2018   LARYNGETOMY N/A 08/10/2017   Procedure: TOTAL LARYNGECTOMY;  Surgeon: Serena Colonel, MD;  Location: [facility] OR;  Service: ENT;  Laterality: N/A;   LARYNGOSCOPY  04/24/2016   Procedure: LARYNGOSCOPY;  Surgeon: Serena Colonel, MD;  Location: [facility] OR;  Service: ENT;;   MULTIPLE EXTRACTIONS WITH ALVEOLOPLASTY N/A 05/26/2016   Procedure: Extraction of tooth #'s 2,5,6,8-13, 21-28 with alveoloplasty, bilateral mandibular tori reductions and buccal exostoses reductions of maxillary left, maxillary right, and mandibular left quadrants.;  Surgeon: Charlynne Pander, DDS;  Location: [facility] OR;  Service: Oral Surgery;  Laterality: N/A;   STOMAPLASTY N/A 06/05/2018   Procedure: Tracheal STOMAPLASTY;  Surgeon: Serena Colonel, MD;  Location: [facility] OR;  Service: ENT;  Laterality: N/A;   TRACHEAL ESOPHAGEAL PROSTHESIS (TEP) CHANGE N/A 09/25/2018   Procedure: TRACHEAL ESOPHAGEAL PROSTHESIS (TEP) CHANGE;  Surgeon: Serena Colonel, MD;  Location: [facility]  OR;  Service: ENT;  Laterality: N/A;   TRACHEOSTOMY TUBE PLACEMENT N/A 04/24/2016   Procedure: TRACHEOSTOMY;  Surgeon: Serena Colonel, MD;  Location: [facility] OR;  Service: ENT;  Laterality: N/A;    FAMILY HISTORY: Family History  Problem Relation Age of Onset   Prostate cancer Maternal Uncle 66       On radiation therapy now, diagnosed in around June 2012   Colon cancer Maternal Uncle    Heart attack Paternal Grandfather        Diet due to MI   Lung cancer Paternal Aunt    Brain cancer Paternal Aunt    Kidney disease Maternal Uncle    Hypertension Mother    CAD Father    Colon polyps Neg Hx    Diabetes Neg Hx    Gallbladder disease Neg Hx    Esophageal cancer Neg Hx     SOCIAL HISTORY: Social History   Socioeconomic History   Marital status: Single    Spouse name: Not on  file   Number of children: 4   Years of education: Not on file   Highest education level: Not on file  Occupational History   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   Drug use: No    Frequency: 2.0 times per week    Comment: denies   Sexual activity: Yes    Partners: Female  Other Topics Concern   Not on file  Social History Narrative   Lives in [place] with his fiance.   Moved to [place] 11 years before. Used to live in [place] for 20 years before that.   Has 4 healthy kids from previous wife- separated now.   Is not working full-time since last 2 and half years- since early 2010.   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   Social Determinants of Health   Financial  Resource Strain: Low Risk  (05/24/2022)   Overall Financial Resource Strain (CARDIA)    Difficulty of Paying Living Expenses: Not very hard  Food Insecurity: No Food Insecurity (12/01/2022)   Hunger Vital Sign    Worried About Running Out of Food in the Last Year: Never true    Ran Out of Food in the Last Year: Never true  Transportation Needs: No Transportation Needs (12/01/2022)   PRAPARE - Administrator, Civil Service (Medical): No    Lack of Transportation (Non-Medical): No  Physical Activity: Sufficiently Active (05/24/2022)   Exercise Vital Sign    Days of Exercise per Week: 7 days    Minutes of Exercise per Session: 40 min  Stress: No Stress Concern Present (05/24/2022)   [employer] of Occupational Health - Occupational Stress Questionnaire    Feeling of Stress : Only a little  Social Connections: Moderately Integrated (05/24/2022)   Social Connection and Isolation Panel [NHANES]    Frequency of Communication with Friends and Family: More than three times a week    Frequency of Social Gatherings with Friends and Family: More than three times a week    Attends Religious Services: More than 4 times per year    Active Member of [employer] or Organizations: No    Attends Banker Meetings: Never    Marital Status: Living with partner  Intimate Partner Violence: Not At Risk (12/01/2022)   Humiliation, Afraid, Rape, and Kick questionnaire    Fear of Current or Ex-Partner: No    Emotionally Abused: No    Physically Abused: No    Sexually Abused: No      PHYSICAL EXAM  There were no vitals filed for this visit. There is no height or weight on file to calculate BMI.  Generalized: Well developed, in no acute distress   Neurological examination  Mentation: Alert oriented to time, place, history taking. Follows all commands speech and language fluent Cranial nerve II-XII: Pupils were equal round reactive to light. Extraocular movements were full, visual  field were full on confrontational test. Facial sensation and strength were normal. Uvula tongue midline. Head turning  and shoulder shrug  were normal and symmetric. Motor: The motor testing reveals 5 over 5 strength of all 4 extremities. Good symmetric motor tone is noted throughout.  Sensory: Sensory testing is intact to soft touch on all 4 extremities. No evidence of extinction is noted.  Coordination: Cerebellar testing reveals good finger-nose-finger and heel-to-shin bilaterally.  Gait and station: Gait is normal. Tandem gait is normal. Romberg is negative. No drift is seen.  Reflexes: Deep tendon reflexes are symmetric and normal bilaterally.   DIAGNOSTIC DATA (LABS, IMAGING, TESTING) - I reviewed patient records, labs, notes, testing and imaging myself where available.  Lab Results  Component Value Date   WBC 5.3 12/01/2022   HGB 13.3 12/01/2022   HCT 40.4 12/01/2022   MCV 92.4 12/01/2022   PLT 270 12/01/2022      Component Value Date/Time   NA 136 12/02/2022 0355   NA 145 (H) 05/24/2022 1018   NA 141 01/04/2017 1432   K 3.4 (L) 12/02/2022 0355   K 4.1 01/04/2017 1432   CL 102 12/02/2022 0355   CO2 25 12/02/2022 0355   CO2 27 01/04/2017 1432   GLUCOSE 112 (H) 12/02/2022 0355   GLUCOSE 93 01/04/2017 1432   BUN 12 12/02/2022 0355   BUN 11 05/24/2022 1018   BUN 12.2 01/04/2017 1432   CREATININE 0.97 12/02/2022 0355   CREATININE 0.8 01/04/2017 1432   CALCIUM 9.2 12/02/2022 0355   CALCIUM 9.8 01/04/2017 1432   PROT 7.3 12/01/2022 1254   PROT 7.9 05/24/2022 1018   PROT 6.9 01/04/2017 1432   ALBUMIN 4.4 12/01/2022 1254   ALBUMIN 5.0 (H) 05/24/2022 1018   ALBUMIN 3.8 01/04/2017 1432   AST 21 12/01/2022 1254   AST 18 01/04/2017 1432   ALT 14 12/01/2022 1254   ALT 12 01/04/2017 1432   ALKPHOS 51 12/01/2022 1254   ALKPHOS 67 01/04/2017 1432   BILITOT 0.5 12/01/2022 1254   BILITOT 0.3 05/24/2022 1018   BILITOT 0.23 01/04/2017 1432   GFRNONAA >60 12/02/2022 0355    GFRNONAA 82 03/06/2011 0853   GFRAA 88 11/12/2019 1025   GFRAA >89 03/06/2011 0853   Lab Results  Component Value Date   CHOL 235 (H) 11/08/2022   HDL 67 11/08/2022   LDLCALC 147 (H) 11/08/2022   TRIG 117 11/08/2022   CHOLHDL 3.5 11/08/2022   Lab Results  Component Value Date   HGBA1C 6.2 (H) 12/02/2022   No results found for: "VITAMINB12" Lab Results  Component Value Date   TSH 4.140 11/08/2022      ASSESSMENT AND PLAN 62 y.o. year old male  has a past medical history of Allergic rhinitis, Arthritis, Colon polyp, Dental caries, Elevated blood pressure reading, GERD (gastroesophageal reflux disease), Headache, History of radiation therapy (09/11/16- 10/30/16), HLD (hyperlipidemia), Hypothyroidism, Internal hemorrhoids, Laryngeal cancer (HCC), Pneumonia, and Tubular adenoma of colon. here with:    *** : Residual deficit: ***. Continue aspirin 81 mg daily and clopidogrel 75 mg daily for three months then just ASA 81 mg daily  for secondary stroke prevention.  Discussed secondary stroke prevention measures and importance of close PCP follow up for aggressive stroke risk factor management. I have gone over the pathophysiology of stroke, warning signs and symptoms, risk factors and their management in some detail with instructions to go to the closest emergency room for symptoms of concern. HTN: BP goal <130/90.  Stable on *** per PCP HLD: LDL goal <70. Recent LDL 147. On crestor 40 mg daily  DMII: A1c  goal<7.0. Recent A1c 62.  Encouraged patient to monitor diet and encouraged exercise FU with our office ***      Butch Penny, MSN, NP-C 12/31/2022, 2:36 PM [facility] [address] [place], [place] [address] [phone]

## 2023-01-01 ENCOUNTER — Ambulatory Visit (INDEPENDENT_AMBULATORY_CARE_PROVIDER_SITE_OTHER): Payer: Self-pay | Admitting: Adult Health

## 2023-01-01 ENCOUNTER — Encounter: Payer: Self-pay | Admitting: Adult Health

## 2023-01-01 VITALS — BP 131/81 | HR 81 | Ht 65.0 in | Wt 133.8 lb

## 2023-01-01 DIAGNOSIS — I6521 Occlusion and stenosis of right carotid artery: Secondary | ICD-10-CM | POA: Diagnosis not present

## 2023-01-01 DIAGNOSIS — E7841 Elevated Lipoprotein(a): Secondary | ICD-10-CM | POA: Diagnosis not present

## 2023-01-01 DIAGNOSIS — G453 Amaurosis fugax: Secondary | ICD-10-CM

## 2023-01-01 NOTE — Telephone Encounter (Signed)
Next appt scheduled 12/11 with PCP.

## 2023-01-01 NOTE — Progress Notes (Signed)
I agree with the above plan 

## 2023-01-01 NOTE — Patient Instructions (Signed)
Your Plan:  Continue ASA and plavix. Will divert future guidance to Dr. Corliss Skains pending procedure on Friday  Blood pressure goal <130/90 Cholesterol LDL goal <70 Diabetes goal A1c <7 Monitor diet and try to exercise   Thank you for coming to see [place] at [facility]. I hope we have been able to provide you high quality care today.  You may receive a patient satisfaction survey over the next few weeks. We would appreciate your feedback and comments so that we may continue to improve ourselves and the health of our patients.

## 2023-01-02 ENCOUNTER — Ambulatory Visit (HOSPITAL_COMMUNITY): Payer: Medicaid Other

## 2023-01-04 ENCOUNTER — Encounter: Payer: Self-pay | Admitting: Hematology and Oncology

## 2023-01-04 ENCOUNTER — Inpatient Hospital Stay: Payer: Medicaid Other | Attending: Hematology and Oncology | Admitting: Hematology and Oncology

## 2023-01-04 ENCOUNTER — Inpatient Hospital Stay: Payer: Medicaid Other

## 2023-01-04 ENCOUNTER — Other Ambulatory Visit: Payer: Self-pay | Admitting: Radiology

## 2023-01-04 VITALS — BP 133/84 | HR 90 | Temp 97.4°F | Resp 18 | Ht 65.0 in | Wt 133.6 lb

## 2023-01-04 DIAGNOSIS — C321 Malignant neoplasm of supraglottis: Secondary | ICD-10-CM

## 2023-01-04 DIAGNOSIS — G453 Amaurosis fugax: Secondary | ICD-10-CM

## 2023-01-04 DIAGNOSIS — Z79899 Other long term (current) drug therapy: Secondary | ICD-10-CM | POA: Diagnosis not present

## 2023-01-04 DIAGNOSIS — Z7902 Long term (current) use of antithrombotics/antiplatelets: Secondary | ICD-10-CM | POA: Diagnosis not present

## 2023-01-04 DIAGNOSIS — L905 Scar conditions and fibrosis of skin: Secondary | ICD-10-CM | POA: Insufficient documentation

## 2023-01-04 DIAGNOSIS — H53121 Transient visual loss, right eye: Secondary | ICD-10-CM | POA: Diagnosis not present

## 2023-01-04 DIAGNOSIS — Z8673 Personal history of transient ischemic attack (TIA), and cerebral infarction without residual deficits: Secondary | ICD-10-CM | POA: Diagnosis not present

## 2023-01-04 DIAGNOSIS — Z9002 Acquired absence of larynx: Secondary | ICD-10-CM

## 2023-01-04 DIAGNOSIS — Z79890 Hormone replacement therapy (postmenopausal): Secondary | ICD-10-CM | POA: Insufficient documentation

## 2023-01-04 DIAGNOSIS — E032 Hypothyroidism due to medicaments and other exogenous substances: Secondary | ICD-10-CM

## 2023-01-04 LAB — CMP (CANCER CENTER ONLY)
ALT: 13 U/L (ref 0–44)
AST: 22 U/L (ref 15–41)
Albumin: 5 g/dL (ref 3.5–5.0)
Alkaline Phosphatase: 71 U/L (ref 38–126)
Anion gap: 7 (ref 5–15)
BUN: 16 mg/dL (ref 8–23)
CO2: 28 mmol/L (ref 22–32)
Calcium: 9.9 mg/dL (ref 8.9–10.3)
Chloride: 103 mmol/L (ref 98–111)
Creatinine: 1.08 mg/dL (ref 0.61–1.24)
GFR, Estimated: >60 mL/min (ref >60)
Glucose, Bld: 111 mg/dL — ABNORMAL HIGH (ref 70–99)
Potassium: 4.1 mmol/L (ref 3.5–5.1)
Sodium: 138 mmol/L (ref 135–145)
Total Bilirubin: 0.7 mg/dL (ref 0.3–1.2)
Total Protein: 8.2 g/dL — ABNORMAL HIGH (ref 6.5–8.1)

## 2023-01-04 LAB — CBC WITH DIFFERENTIAL (CANCER CENTER ONLY)
Abs Immature Granulocytes: 0 10*3/uL (ref 0.00–0.07)
Basophils Absolute: 0.1 10*3/uL (ref 0.0–0.1)
Basophils Relative: 1 %
Eosinophils Absolute: 0.1 10*3/uL (ref 0.0–0.5)
Eosinophils Relative: 2 %
HCT: 42.1 % (ref 39.0–52.0)
Hemoglobin: 14.3 g/dL (ref 13.0–17.0)
Immature Granulocytes: 0 %
Lymphocytes Relative: 27 %
Lymphs Abs: 1.3 10*3/uL (ref 0.7–4.0)
MCH: 31.1 pg (ref 26.0–34.0)
MCHC: 34 g/dL (ref 30.0–36.0)
MCV: 91.5 fL (ref 80.0–100.0)
Monocytes Absolute: 0.7 10*3/uL (ref 0.1–1.0)
Monocytes Relative: 13 %
Neutro Abs: 2.8 10*3/uL (ref 1.7–7.7)
Neutrophils Relative %: 57 %
Platelet Count: 248 10*3/uL (ref 150–400)
RBC: 4.6 MIL/uL (ref 4.22–5.81)
RDW: 14.3 % (ref 11.5–15.5)
WBC Count: 4.9 10*3/uL (ref 4.0–10.5)
nRBC: 0 % (ref 0.0–0.2)

## 2023-01-04 NOTE — Progress Notes (Signed)
[facility] OFFICE PROGRESS NOTE  Patient Care Team: [name], MD as PCP - General Lonie Peak, MD as Attending Physician (Radiation Oncology) Anabel Bene, RD as Dietitian (Nutrition)  HISTORY OF PRESENTING ILLNESS: Discussed the use of AI scribe software for clinical note transcription with the patient, who gave verbal consent to proceed.  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 The vision in the right eye has since returned to normal, but he is scheduled for a procedure to further investigate the issue.  The patient has been prescribed aspirin and Plavix and reports minor bleeding in the tracheostomy area. He had stopped taking the blood thinner for a day, suspecting it to be the cause of the bleeding.  The patient denies any new changes in the neck, lumps, or bumps. However, he reports soreness when pressure is applied to the neck. He has received a flu shot and denies smoking or drinking.  The patient has been maintaining his weight and has been advised to perform neck exercises to manage scar tissue from radiation treatment. His blood work results were reported as normal.         Assessment and Plan    Transient Ischemic Attack (TIA) Recent episode of visual changes in the right eye, suspected to be a small stroke. Vision has since returned to normal. Patient is on Aspirin and Plavix. -Continue Aspirin and Plavix. -Continue to investigate the cause of the TIA.  History of head and neck cancer, s/p tracheostomy Minor bleeding noted, likely due to blood thinners and the presence of an artificial hole in the throat. No significant bleeding. -Continue to monitor for any significant bleeding No signs of cancer recurrence  Neck Stiffness Likely due to scar tissue formation from radiation  therapy. -Perform neck exercises multiple times a day to prevent further stiffness and maintain good posture.  General Health Maintenance I will see him annually          Orders Placed This Encounter  Procedures   CBC with Differential (Cancer Center Only)    Standing Status:   Future    Standing Expiration Date:   01/04/2024   CMP (Cancer Center only)    Standing Status:   Future    Standing Expiration Date:   01/04/2024    All questions were answered. The patient knows to call the clinic with any problems, questions or concerns. The total time spent in the appointment was 30 minutes encounter with patients including review of chart and various tests results, discussions about plan of care and coordination of care plan   Artis Delay, MD 01/04/2023 12:32 PM  REVIEW OF SYSTEMS:   Constitutional: Denies fevers, chills or abnormal weight loss Eyes: Denies blurriness of vision Ears, nose, mouth, throat, and face: Denies mucositis or sore throat Respiratory: Denies cough, dyspnea or wheezes Cardiovascular: Denies palpitation, chest discomfort or lower extremity swelling Gastrointestinal:  Denies nausea, heartburn or change in bowel habits Skin: Denies abnormal skin rashes Lymphatics: Denies new lymphadenopathy or easy bruising Neurological:Denies numbness, tingling or new weaknesses Behavioral/Psych: Mood is stable, no new changes  All other systems were reviewed with the patient and are negative.  I have reviewed the past medical history, past surgical history, social history and family history with the patient and they are unchanged from previous note.  ALLERGIES:  is  allergic to other.  MEDICATIONS:  Current Outpatient Medications  Medication Sig Dispense Refill   acetaminophen (TYLENOL) 500 MG tablet Take 1,000 mg by mouth every 6 (six) hours as needed for moderate pain or headache.     amlodipine-olmesartan (AZOR) 10-20 MG tablet TAKE 1 TABLET BY MOUTH DAILY 90 tablet 3    aspirin EC 81 MG tablet Take 1 tablet (81 mg total) by mouth daily. Swallow whole. 90 tablet 3   clopidogrel (PLAVIX) 75 MG tablet Take 1 tablet (75 mg total) by mouth daily. 90 tablet 3   levothyroxine (SYNTHROID) 112 MCG tablet TAKE 1 TABLET(112 MCG) BY MOUTH DAILY 90 tablet 1   omeprazole (PRILOSEC) 20 MG capsule Take 1 capsule (20 mg total) by mouth daily. 90 capsule 1   rosuvastatin (CRESTOR) 40 MG tablet TAKE 1 TABLET(40 MG) BY MOUTH DAILY 30 tablet 11   No current facility-administered medications for this visit.    SUMMARY OF ONCOLOGIC HISTORY: Oncology History  Malignant neoplasm of supraglottis (HCC)  04/17/2016 Imaging   Ct neck showed relatively large approximately 3.5 cm laryngeal mass most compatible with laryngeal carcinoma. Epicenter at the glottis on the right. Extension across midline as well as involvement of laryngeal cartilages (possibly also bilateral). 2. Relatively small but malignant appearing right level 2 lymphadenopathy. Suspicious contralateral left level 2 lymph nodes.   04/21/2016 Initial Diagnosis   He saw Dr. Pollyann Kennedy in the clinic for chronic hoarseness. In the office, it was noted that the posterior soft palate, uvula, tongue base and vallecula were visualized and appeared healthy without mucosal masses or lesions. There is a large mass involving the right side of the larynx, the right cord is fixed. There may be involvement of the post cricoid area it is difficult to say for sure. 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 Clinically this is at least staged as T3. The airway is significantly impaired. He is at risk for impending airway obstruction. ENT recommended him for awake tracheostomy with direct laryngoscopy and biopsy and esophagoscopy under anesthesia immediately following that.    04/24/2016 Pathology Results   Larynx, biopsy, Right Side - POSITIVE FOR INVASIVE SQUAMOUS CELL CARCINOMA. - SEE  COMMENT. Microscopic Comment As sampled, the squamous cell carcinoma appears well differentiated. A fragment of benign cartilage is also present in the specimen.   04/24/2016 Surgery   She underwent direct laryngoscopy with biopsy. An anterior commissure scope was used to evaluate the larynx. A large mass and, using the right side of the larynx and the anterior commissure area was identified. Multiple biopsies were taken. The left cord was swollen but seemed to be uninvolved. Piriforms appeared to be clear. The post cricoid area appeared clear. Esophagoscopy was difficult and ENT surgeon was unable to pass the scope into the esophagus. He had tracheostomy   04/24/2016 Pathology Results   Larynx, biopsy, Right Side - POSITIVE FOR INVASIVE SQUAMOUS CELL CARCINOMA. - SEE COMMENT. Microscopic Comment As sampled, the squamous cell carcinoma appears well differentiated. A fragment of benign cartilage is also present in the specimen.    05/15/2016 PET scan   Right eccentric glottic mass, appears to cross the anterior commissure with destructive findings of the thyroid cartilage. There is an upper normal size right station 2A lymph node with maximum SUV of 4.2 which is only very faintly elevated over background, but which could possibly represent a small amount of local metastatic spread. This is likely a T4a tumor and  accordingly stage IVa whether not the right station 2 lymph node is positive. 2. Asymmetric calcification along the head of the right caudate nucleus and adjacent lentiform nucleus. Well this can sometimes be physiologic, there is some accompanying reduction in PET activity, and the asymmetry is an unusual feature. Accordingly I recommend MRI of the brain with and without contrast for further characterization. 3. A AP window lymph node is minimally enlarged at 1.1 cm, but with a maximum SUV below background mediastinal levels, likely benign.   05/23/2016 Imaging   MRI brain: Atrophy with  chronic microvascular ischemic change. No intracranial metastatic disease. 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     05/26/2016 Surgery   Multiple extraction of tooth numbers 2, 5, 6, 8-13, and  21-28. 2. 4 Quadrants of alveoloplasty. 3. Bilateral mandibular lingual tori reductions 4. Maxillary right, maxillary left, and mandibular left buccal exostoses reductions   06/02/2016 Procedure   Successful placement of a right internal jugular approach power injectable Port-A-Cath. The catheter is ready for immediate use   06/09/2016 - 08/22/2016 Chemotherapy   He received induction chemotherapy with taxotere, cisplatin and 5 FU x 4 cycles   07/31/2016 PET scan   Mild asymmetric soft tissue prominence along the right supraglottic larynx, without hypermetabolism, likely corresponding to treated tumor. No findings suspicious for metastatic disease. Small mediastinal and left axillary nodes, without associated hypermetabolism.   09/11/2016 - 10/30/2016 Radiation Therapy   He received radiation Radiation treatment dates:   09/11/16-10/30/16   Site/dose:   Larynx and b/l neck, 70 Gy in 35 fractions   Beams/energy:   IMRT, 6X     01/01/2017 Imaging   CT neck: Asymmetric soft tissue prominence in the right supraglottic region extending down to the glottis on the right. This appears similar to the previous PET scan at which time there was not any hypermetabolism in the region. Therefore, this could be chronic post treatment change. However, there does appear to be some enhancing tissue and I cannot exclude the possibility of residual or recurrent tumor on the basis of this exam. There is no evidence of any nodal disease.   05/04/2017 Procedure   Trachestomy was removed   06/19/2017 Imaging   MRI brain 1. Abnormal enhancement versus intrinsic density RIGHT  basal ganglia, subacute infarct or metastasis possible. 2. More conspicuous 8 x 10 mm nodular enhancement RIGHT supraglottic fat at site of primary tumor associated with vocal cord paralysis. Recurrent tumor suspected. Recommend direct inspection. 3. Post radiation changes of the neck including sialoadenitis. Worsening supraglottic edema. 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  4. Interval tracheostomy tube removal.   06/21/2017 Imaging   MRI brain 1. No evidence of intracranial metastases or acute abnormality. 2. 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 Basal ganglia mineralization accounts for the density on CT.   06/22/2017 PET scan   1. There is continued soft tissue prominence along the right glottic and supraglottic region but without significant accentuated hypermetabolic activity to further suggest recurrence. No hypermetabolic adenopathy or other lesion to suggest active malignancy at this time. 2. Stable appearance of hyperdensity along the right basal ganglia attributed to underlying developmental venous anomaly based on prior MRI workup.  3. Other imaging findings of potential clinical significance: Aortic Atherosclerosis (ICD10-I70.0). Coronary atherosclerosis.   07/06/2017 Surgery   He had direct laryngoscopy and biopsy  07/11/2017 Pathology Results   Biopsy confirmed invasive squamous cell carcinoma   08/10/2017 Surgery   PROCEDURE:  Procedure(s): Dr. Pollyann Kennedy performed TOTAL LARYNGECTOMY     08/10/2017 Pathology Results   1. Pharynx, biopsy, right margin - BENIGN FIBROADIPOSE TISSUE. - THERE IS NO EVIDENCE OF MALIGNANCY. 2. Larynx, laryngectomy, Total - INVASIVE SQUAMOUS CELL CARCINOMA, WELL DIFFERENTIATED, HORIZONTALLY SPANNING 1.0 CM. - CARCINOMA INVADES 0.1 CM. - THE SURGICAL RESECTIONS ARE NEGATIVE FOR CARCINOMA. - SEE ONCOLOGY TABLE BELOW. Microscopic Comment 2. LARYNX  (SUPRAGLOTTIS, GLOTTIS, SUBGLOTTIS): Procedure: Laryngectomy. Tumor Site: Right true cord with extension to midline. Tumor Laterality: Right. Tumor Focality: Unifocal. Tumor Size: 1.0 cm horizontally (gross measurement), invades 0.1 cm. Histologic Type: Squamous cell carcinoma. Histologic Grade: Well differentiated. Margins: Negative, closest margin is right lateral mucosal margin at 3.0 cm (gross measurement). Lymphovascular Invasion: Not identified. Perineural Invasion: Not identified. Regional Lymph Nodes: None examined. Pathologic Stage Classification (pTNM, AJCC 8th Edition): pT1a, pNX. Ancillary Studies: Can be performed upon clinician request. Representative Tumor Block: 2H.   04/16/2018 Procedure   Removal of implanted Port-A-Cath utilizing sharp and blunt dissection. The procedure was uncomplicated     PHYSICAL EXAMINATION: ECOG PERFORMANCE STATUS: 0 - Asymptomatic  Vitals:   01/04/23 0938  BP: 133/84  Pulse: 90  Resp: 18  Temp: (!) 97.4 F (36.3 C)  SpO2: 99%   Filed Weights   01/04/23 0938  Weight: 133 lb 9.6 oz (60.6 kg)    GENERAL:alert, no distress and comfortable SKIN: skin color, texture, turgor are normal, no rashes or significant lesions EYES: normal, Conjunctiva are pink and non-injected, sclera clear OROPHARYNX:no exudate, no erythema and lips, buccal mucosa, and tongue normal  NECK: noted tracheostomy without signs of bleeding. Noted fibrosis and limited ROM LYMPH:  no palpable lymphadenopathy in the cervical, axillary or inguinal LUNGS: clear to auscultation and percussion with normal breathing effort HEART: regular rate & rhythm and no murmurs and no lower extremity edema ABDOMEN:abdomen soft, non-tender and normal bowel sounds Musculoskeletal:no cyanosis of digits and no clubbing  NEURO: alert & oriented x 3 with fluent speech, no focal motor/sensory deficits  LABORATORY DATA:  I have reviewed the data as listed    Component Value Date/Time    NA 138 01/04/2023 0919   NA 145 (H) 05/24/2022 1018   NA 141 01/04/2017 1432   K 4.1 01/04/2023 0919   K 4.1 01/04/2017 1432   CL 103 01/04/2023 0919   CO2 28 01/04/2023 0919   CO2 27 01/04/2017 1432   GLUCOSE 111 (H) 01/04/2023 0919   GLUCOSE 93 01/04/2017 1432   BUN 16 01/04/2023 0919   BUN 11 05/24/2022 1018   BUN 12.2 01/04/2017 1432   CREATININE 1.08 01/04/2023 0919   CREATININE 0.8 01/04/2017 1432   CALCIUM 9.9 01/04/2023 0919   CALCIUM 9.8 01/04/2017 1432   PROT 8.2 (H) 01/04/2023 0919   PROT 7.9 05/24/2022 1018   PROT 6.9 01/04/2017 1432   ALBUMIN 5.0 01/04/2023 0919   ALBUMIN 5.0 (H) 05/24/2022 1018   ALBUMIN 3.8 01/04/2017 1432   AST 22 01/04/2023 0919   AST 18 01/04/2017 1432   ALT 13 01/04/2023 0919   ALT 12 01/04/2017 1432   ALKPHOS 71 01/04/2023 0919   ALKPHOS 67 01/04/2017 1432   BILITOT 0.7 01/04/2023 0919   BILITOT 0.23 01/04/2017 1432   GFRNONAA >60 01/04/2023 0919   GFRNONAA 82 03/06/2011 0853   GFRAA 88 11/12/2019 1025   GFRAA >89 03/06/2011 0853    No results found  for: "SPEP", "UPEP"  Lab Results  Component Value Date   WBC 4.9 01/04/2023   NEUTROABS 2.8 01/04/2023   HGB 14.3 01/04/2023   HCT 42.1 01/04/2023   MCV 91.5 01/04/2023   PLT 248 01/04/2023      Chemistry      Component Value Date/Time   NA 138 01/04/2023 0919   NA 145 (H) 05/24/2022 1018   NA 141 01/04/2017 1432   K 4.1 01/04/2023 0919   K 4.1 01/04/2017 1432   CL 103 01/04/2023 0919   CO2 28 01/04/2023 0919   CO2 27 01/04/2017 1432   BUN 16 01/04/2023 0919   BUN 11 05/24/2022 1018   BUN 12.2 01/04/2017 1432   CREATININE 1.08 01/04/2023 0919   CREATININE 0.8 01/04/2017 1432      Component Value Date/Time   CALCIUM 9.9 01/04/2023 0919   CALCIUM 9.8 01/04/2017 1432   ALKPHOS 71 01/04/2023 0919   ALKPHOS 67 01/04/2017 1432   AST 22 01/04/2023 0919   AST 18 01/04/2017 1432   ALT 13 01/04/2023 0919   ALT 12 01/04/2017 1432   BILITOT 0.7 01/04/2023 0919    BILITOT 0.23 01/04/2017 1432       RADIOGRAPHIC STUDIES: I have personally reviewed the radiological images as listed and agreed with the findings in the report. IR Radiologist Eval & Mgmt  Result Date: 12/18/2022 EXAM: NEW PATIENT OFFICE VISIT CHIEF COMPLAINT: Right ICA occlusion.  History of amaurosis fugax. Current Pain Level: 1-10 HISTORY OF PRESENT ILLNESS: Patient is a 62 year old right-handed gentleman referred by neurology for management of a recently discovered right ICA occlusion. History obtained from the patient, the spouse, and patient's electronic medical records which also included relevant appropriate neuroimaging studies. Approximately 2 weeks ago, the patient presented with acute onset of complete blindness in his right eye which came on as occurring from the top to the bottom as per patient. At this time the patient also complained of a severe orbital pain of the throbbing nature. The amaurosis fugax lasted about 2 hours. During this time the patient reports no history of speech difficulties, facial numbness or weakness, or left-sided motor weakness, incoordination or sensory abnormalities. His speech remained unchanged and was able to comprehend and speak appropriately during these spells. Patient underwent evaluation in the ER which also involved a CT angiogram of the head and neck. CT of the brain was unremarkable. CT of the head and neck revealed occluded right internal carotid artery extending from the bulb to the petrous segment. Apparent filling of the right anterior circulation via the anterior communicating artery from the left, and also retrogradely via the ipsilateral right posterior communicating artery noted. Also noted was smooth irregularity without significant stenosis of the common carotid arteries bilaterally, probably related to patient's history of radiation for laryngeal carcinoma. Patient reports no further spells of vision difficulties. He also denies any new  neurological symptoms or signs especially involving his left side, or of spells of mental status change, or of seizure-like activity. Patient denied any symptoms of chest pain, shortness of breath or palpitations. He denies any difficulty breathing, or wheezing, or hemoptysis. Patient reports no difficulty with swallowing liquids or solids. Denies any abdominal pain, constipation, diarrhea or melena. Denies any dysuria, hematuria, or polyuria. Diagnosis * : Date . * : Allergic rhinitis * : . * : Arthritis * : * : Bilateral knee arthritis for about 20 years. Not taking any medications, believes in nature processes . * : Colon polyp * : . * :  Dental caries * : * : Prechemo radiation therapy, periodontitis . * : Elevated blood pressure reading * : * : White Coat Hypertension . * : GERD (gastroesophageal reflux disease) * : . * : Headache * : . * : History of radiation therapy * : 09/11/16- 10/30/16 * : Larynx 70 Gy in 35 fractions . * : HLD (hyperlipidemia) * : . * : Hypothyroidism * : * : radiation induced hypothyroidism . * : Internal hemorrhoids * : . * : Laryngeal cancer (HCC) * : . * : Pneumonia * : * : as a child . * : Tubular adenoma of colon * : : Current Outpatient Medications: . acetaminophen (TYLENOL) 500 MG tablet, Take 1,000 mg by mouth every 6 (six) hours as needed for moderate pain or headache., Disp: , Rfl: . amlodipine-olmesartan (AZOR) 10-20 MG tablet, TAKE 1 TABLET BY MOUTH DAILY, Disp: 90 tablet, Rfl: 3. aspirin EC 81 MG tablet, Take 1 tablet (81 mg total) by mouth daily. Swallow whole., Disp: 90 tablet, Rfl: 3. clopidogrel (PLAVIX) 75 MG tablet, Take 1 tablet (75 mg total) by mouth daily., Disp: 90 tablet, Rfl: 3. levothyroxine (SYNTHROID) 112 MCG tablet, TAKE 1 TABLET(112 MCG) BY MOUTH DAILY (Patient not taking: Reported on 12/02/2022), Disp: 90 tablet, Rfl: 1. omeprazole (PRILOSEC) 20 MG capsule, Take 1 capsule (20 mg total) by mouth daily., Disp: 90 capsule, Rfl: 1. rosuvastatin (CRESTOR) 40 MG  tablet, TAKE 1 TABLET(40 MG) BY MOUTH DAILY (Patient not taking: Reported on 12/02/2022), Disp: 30 tablet, Rfl: 11 Allergies: No known allergies. Social History: Stopped smoking few years ago. Past history of smoking up to half a pack a day for 40 years. Occasional alcohol as per patient. Denies use of illicit chemicals presently. Problem * : Relation * : Age of Onset . * : Prostate cancer * : Maternal Uncle * : 16 * :     On radiation therapy now, diagnosed in around June 2012 . * : Colon cancer * : Maternal Uncle * : . * : Heart attack * : Paternal Grandfather * : * :     Diet due to MI . * : Lung cancer * : Paternal Aunt * : . * : Brain cancer * : Paternal Aunt * : . * : Kidney disease * : Maternal Uncle * : . * : Hypertension * : Mother * : . * : CAD * : Father * : . * : Colon polyps * : Neg Hx * : . * : Diabetes * : Neg Hx * : . * : Gallbladder disease * : Neg Hx * : . * : Esophageal cancer * : Neg Hx * : REVIEW OF SYSTEMS: Negative unless as mentioned above. PHYSICAL EXAMINATION: Alert, awake, oriented to time, place, space. 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 No lateralizing abnormal neurological features noted otherwise. Station and gait normal. ASSESSMENT AND PLAN: The patient's CT angiogram of the head and neck was reviewed. This reveals no flow in the right internal carotid artery proximally and distally except for reconstitution in the supraclinoid segments as described above. Also noted were areas of smooth multiple irregularities involving the common carotid arteries bilaterally probably secondary to post radiation sequela. Discussion ensued regarding follow-up. Patient advised to continue on his present dual antiplatelets regimen in addition to his other prescribed medications. Patient advised to maintain adequate hydration. Diagnostic catheter arteriogram would be appropriate to more accurately evaluate for the possibility of  a string  sign at some point. This will be scheduled as soon as possible. Electronically Signed   By: [name] M.D.   On: 12/18/2022 07:58

## 2023-01-04 NOTE — H&P (Signed)
Chief Complaint: Patient was seen in consultation today for right ICA occlusion   Referring Physician(s): Dr. Pearlean Brownie   Supervising Physician: [name]  Patient Status: [facility] - Out-pt  History of Present Illness: Jonathan [name] is a 62 y.o. 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 He is familiar to IR from port-a-catheter placement and removal (2018 and 2020 respectively). He presented to the ED 12/01/22 for evaluation of painful, transient vision loss. This progressed to vision loss in the top part of his right eye. After several hours his vision returned to baseline. 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 He was evaluated by neurology and they were unsure if the vision loss was Amaurosis fugax versus possible glaucoma or anterior ischemic optic neuritis. He was started on 81 mg aspirin and 75 mg plavix.    He underwent additional work up including evaluation by Ophthalmology. It was determined that his transient vision loss was likely due to transient ischemia as a result of microemboli from the occluded right ICA. He was discharged home 12/02/22 with outpatient follow up.   He met with Dr. Corliss Skains 12/15/22 to discuss management options. A diagnostic cerebral angiogram was recommended for further evaluation and the patient was in agreement.   Past Medical History:  Diagnosis Date   Allergic rhinitis    Arthritis    Bilateral knee arthritis for about 20 years. Not taking any medications, believes in nature processes   Colon polyp    Dental caries    prechemoradiation therapy, periodontitis   Elevated blood pressure reading    White Coat Hypertension   GERD (gastroesophageal reflux disease)    Headache    History of radiation therapy 09/11/16- 10/30/16   Larynx 70 Gy in 35 fractions   HLD (hyperlipidemia)    Hypothyroidism    radiation induced hypothyroidism   Internal hemorrhoids     Laryngeal cancer (HCC)    Pneumonia    as a child   Tubular adenoma of colon     Past Surgical History:  Procedure Laterality Date   COLONOSCOPY  03/18/2012   DIRECT LARYNGOSCOPY N/A 07/06/2017   Procedure: DIRECT LARYNGOSCOPY;  Surgeon: Serena Colonel, MD;  [facility] OR;  Service: ENT;  Laterality: N/A;   ESOPHAGOSCOPY  04/24/2016   Procedure: ESOPHAGOSCOPY;  Surgeon: Serena Colonel, MD;  Location: [facility] OR;  Service: ENT;;   EVALUATION UNDER ANESTHESIA WITH HEMORRHOIDECTOMY N/A 05/15/2019   Procedure: ANORECTAL EXAM UNDER ANESTHESIA WITH EXCISION; HEMORRHOIDECTOMY, FULGURATION OF FLAT CONDYLOMA;  Surgeon: Andria Meuse, MD;  Location: [facility] ORS;  Service: General;  Laterality: N/A;   IR GENERIC HISTORICAL  06/02/2016   IR US GUIDE VASC ACCESS RIGHT 06/02/2016 Simonne Come, MD [facility]-INTERV RAD   IR GENERIC HISTORICAL  06/02/2016   IR FLUORO GUIDE PORT INSERTION RIGHT 06/02/2016 Simonne Come, MD [facility]-INTERV RAD   IR RADIOLOGIST EVAL & MGMT  12/18/2022   IR REMOVAL TUN ACCESS W/ PORT W/O FL MOD SED  04/16/2018   LARYNGETOMY N/A 08/10/2017   Procedure: TOTAL LARYNGECTOMY;  Surgeon: Serena Colonel, MD;  Location: [facility] OR;  Service: ENT;  Laterality: N/A;   LARYNGOSCOPY  04/24/2016   Procedure: LARYNGOSCOPY;  Surgeon: Serena Colonel, MD;  Location: [facility] OR;  Service: ENT;;   MULTIPLE EXTRACTIONS WITH ALVEOLOPLASTY N/A 05/26/2016   Procedure: Extraction of tooth #'s 4,0,9,8-11, 21-28 with alveoloplasty, bilateral mandibular tori reductions and buccal exostoses reductions of maxillary left, maxillary right, and mandibular left quadrants.;  Surgeon: Charlynne Pander, DDS;  Location: [facility] OR;  Service: Oral Surgery;  Laterality: N/A;   STOMAPLASTY N/A 06/05/2018   Procedure: Tracheal STOMAPLASTY;  Surgeon: Serena Colonel, MD;  Location: [facility] OR;  Service: ENT;  Laterality: N/A;   TRACHEAL ESOPHAGEAL PROSTHESIS (TEP) CHANGE N/A 09/25/2018   Procedure: TRACHEAL ESOPHAGEAL PROSTHESIS (TEP) CHANGE;  Surgeon: Serena Colonel, MD;  Location:  [facility] OR;  Service: ENT;  Laterality: N/A;   TRACHEOSTOMY TUBE PLACEMENT N/A 04/24/2016   Procedure: TRACHEOSTOMY;  Surgeon: Serena Colonel, MD;  Location: [facility] OR;  Service: ENT;  Laterality: N/A;    Allergies: Patient has no known allergies.  Medications: Prior to Admission medications   Medication Sig Start Date End Date Taking? Authorizing Provider  acetaminophen (TYLENOL) 500 MG tablet Take 1,000 mg by mouth every 6 (six) hours as needed for moderate pain or headache.    [provider]  amlodipine-olmesartan (AZOR) 10-20 MG tablet TAKE 1 TABLET BY MOUTH DAILY 07/17/22   [name], MD  aspirin EC 81 MG tablet Take 1 tablet (81 mg total) by mouth daily. Swallow whole. 12/03/22   Marrianne Mood, MD  clopidogrel (PLAVIX) 75 MG tablet Take 1 tablet (75 mg total) by mouth daily. 12/02/22   Marrianne Mood, MD  levothyroxine (SYNTHROID) 112 MCG tablet TAKE 1 TABLET(112 MCG) BY MOUTH DAILY 04/18/22   [name], MD  omeprazole (PRILOSEC) 20 MG capsule Take 1 capsule (20 mg total) by mouth daily. 11/08/22   [name], MD  rosuvastatin (CRESTOR) 40 MG tablet TAKE 1 TABLET(40 MG) BY MOUTH DAILY 01/01/23   [name], MD     Family History  Problem Relation Age of Onset   Hypertension Mother    CAD Father    Prostate cancer Maternal Uncle 66       On radiation therapy now, diagnosed in around June 2012   Colon cancer Maternal Uncle    Kidney disease Maternal Uncle    Lung cancer Paternal Aunt    Brain cancer Paternal Aunt    Heart attack Paternal Grandfather        Diet due to MI   Colon polyps Neg Hx    Diabetes Neg Hx    Gallbladder disease Neg Hx    Esophageal cancer Neg Hx    Stroke Neg Hx     Social History   Socioeconomic History   Marital status: Single    Spouse name: Not on file   Number of children: 4   Years of education: Not on file   Highest education level: Not on file  Occupational History   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   Drug use: No    Frequency: 2.0 times per week    Comment: denies   Sexual activity: Yes    Partners: Female  Other Topics Concern   Not on file  Social History Narrative   Lives in [place] with his fiance.   Moved to [place] 11 years before. Used to live in [place] for 20 years before that.  Has 4 healthy kids from previous wife- separated now.   Is not working full-time since last 2 and half years- since early 2010.   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   Social Determinants of Health   Financial Resource Strain: Low Risk  (05/24/2022)   Overall Financial Resource Strain (CARDIA)    Difficulty of Paying Living Expenses: Not very hard  Food Insecurity: No Food Insecurity (12/01/2022)   Hunger Vital Sign    Worried About Running Out of Food in the Last Year: Never true    Ran Out of Food in the Last Year: Never true  Transportation Needs: No Transportation Needs (12/01/2022)   PRAPARE - Administrator, Civil Service (Medical): No    Lack of Transportation (Non-Medical): No  Physical Activity: Sufficiently Active (05/24/2022)   Exercise Vital Sign    Days of Exercise per Week: 7 days    Minutes of Exercise per Session: 40 min  Stress: No Stress Concern Present (05/24/2022)   [employer] of Occupational Health - Occupational Stress Questionnaire    Feeling of Stress : Only a little  Social Connections: Moderately Integrated (05/24/2022)   Social Connection and Isolation Panel [NHANES]    Frequency of Communication with Friends and  Family: More than three times a week    Frequency of Social Gatherings with Friends and Family: More than three times a week    Attends Religious Services: More than 4 times per year    Active Member of [employer] or Organizations: No    Attends Banker Meetings: Never    Marital Status: Living with partner    Review of Systems: A 12 point ROS discussed and pertinent positives are indicated in the HPI above.  All other systems are negative.  Review of Systems  Constitutional:  Negative for appetite change and fatigue.  Eyes:  Negative for visual disturbance.  Respiratory:  Negative for cough and shortness of breath.   Cardiovascular:  Negative for chest pain and leg swelling.  Gastrointestinal:  Negative for abdominal pain, diarrhea, nausea and vomiting.  Genitourinary:  Negative for flank pain.  Musculoskeletal:  Negative for back pain.  Neurological:  Negative for dizziness and headaches.    Vital Signs: BP 131/86   Pulse 79   Temp 98.1 F (36.7 C) (Oral)   Resp 18   Ht 5\' 5"  (1.651 m)   Wt 134 lb (60.8 kg)   SpO2 96%   BMI 22.30 kg/m   Physical Exam Constitutional:      General: He is not in acute distress.    Appearance: He is not ill-appearing.  HENT:     Mouth/Throat:     Mouth: Mucous membranes are moist.     Pharynx: Oropharynx is clear.  Cardiovascular:     Rate and Rhythm: Normal rate and regular rhythm.     Pulses: Normal pulses.     Heart sounds: Normal heart sounds.  Pulmonary:     Effort: Pulmonary effort is normal.     Breath sounds: Normal breath sounds.     Comments: Tracheostomy with passy-muir valve  Abdominal:     General: Bowel sounds are normal.     Palpations: Abdomen is soft.     Tenderness: There is no abdominal tenderness.  Musculoskeletal:     Right lower leg: No edema.     Left lower leg: No edema.  Skin:    General: Skin is warm and  dry.  Neurological:     Mental Status: He is alert and oriented to person, place, and  time.  Psychiatric:        Mood and Affect: Mood normal.        Behavior: Behavior normal.        Thought Content: Thought content normal.        Judgment: Judgment normal.     Imaging: IR Radiologist Eval & Mgmt  Result Date: 12/18/2022 EXAM: NEW PATIENT OFFICE VISIT CHIEF COMPLAINT: Right ICA occlusion.  History of amaurosis fugax. Current Pain Level: 1-10 HISTORY OF PRESENT ILLNESS: Patient is a 62 year old right-handed gentleman referred by neurology for management of a recently discovered right ICA occlusion. History obtained from the patient, the spouse, and patient's electronic medical records which also included relevant appropriate neuroimaging studies. Approximately 2 weeks ago, the patient presented with acute onset of complete blindness in his right eye which came on as occurring from the top to the bottom as per patient. At this time the patient also complained of a severe orbital pain of the throbbing nature. The amaurosis fugax lasted about 2 hours. During this time the patient reports no history of speech difficulties, facial numbness or weakness, or left-sided motor weakness, incoordination or sensory abnormalities. His speech remained unchanged and was able to comprehend and speak appropriately during these spells. Patient underwent evaluation in the ER which also involved a CT angiogram of the head and neck. CT of the brain was unremarkable. CT of the head and neck revealed occluded right internal carotid artery extending from the bulb to the petrous segment. Apparent filling of the right anterior circulation via the anterior communicating artery from the left, and also retrogradely via the ipsilateral right posterior communicating artery noted. Also noted was smooth irregularity without significant stenosis of the common carotid arteries bilaterally, probably related to patient's history of radiation for laryngeal carcinoma. Patient reports no further spells of vision difficulties. He  also denies any new neurological symptoms or signs especially involving his left side, or of spells of mental status change, or of seizure-like activity. Patient denied any symptoms of chest pain, shortness of breath or palpitations. He denies any difficulty breathing, or wheezing, or hemoptysis. Patient reports no difficulty with swallowing liquids or solids. Denies any abdominal pain, constipation, diarrhea or melena. Denies any dysuria, hematuria, or polyuria. Diagnosis * : Date . * : Allergic rhinitis * : . * : Arthritis * : * : Bilateral knee arthritis for about 20 years. Not taking any medications, believes in nature processes . * : Colon polyp * : . * : Dental caries * : * : Prechemo radiation therapy, periodontitis . * : Elevated blood pressure reading * : * : White Coat Hypertension . * : GERD (gastroesophageal reflux disease) * : . * : Headache * : . * : History of radiation therapy * : 09/11/16- 10/30/16 * : Larynx 70 Gy in 35 fractions . * : HLD (hyperlipidemia) * : . * : Hypothyroidism * : * : radiation induced hypothyroidism . * : Internal hemorrhoids * : . * : Laryngeal cancer (HCC) * : . * : Pneumonia * : * : as a child . * : Tubular adenoma of colon * : : Current Outpatient Medications: . acetaminophen (TYLENOL) 500 MG tablet, Take 1,000 mg by mouth every 6 (six) hours as needed for moderate pain or headache., Disp: , Rfl: . amlodipine-olmesartan (AZOR) 10-20 MG tablet, TAKE 1 TABLET BY MOUTH DAILY, Disp:  90 tablet, Rfl: 3. aspirin EC 81 MG tablet, Take 1 tablet (81 mg total) by mouth daily. Swallow whole., Disp: 90 tablet, Rfl: 3. clopidogrel (PLAVIX) 75 MG tablet, Take 1 tablet (75 mg total) by mouth daily., Disp: 90 tablet, Rfl: 3. levothyroxine (SYNTHROID) 112 MCG tablet, TAKE 1 TABLET(112 MCG) BY MOUTH DAILY (Patient not taking: Reported on 12/02/2022), Disp: 90 tablet, Rfl: 1. omeprazole (PRILOSEC) 20 MG capsule, Take 1 capsule (20 mg total) by mouth daily., Disp: 90 capsule, Rfl: 1. rosuvastatin  (CRESTOR) 40 MG tablet, TAKE 1 TABLET(40 MG) BY MOUTH DAILY (Patient not taking: Reported on 12/02/2022), Disp: 30 tablet, Rfl: 11 Allergies: No known allergies. Social History: Stopped smoking few years ago. Past history of smoking up to half a pack a day for 40 years. Occasional alcohol as per patient. Denies use of illicit chemicals presently. Problem * : Relation * : Age of Onset . * : Prostate cancer * : Maternal Uncle * : 28 * :     On radiation therapy now, diagnosed in around June 2012 . * : Colon cancer * : Maternal Uncle * : . * : Heart attack * : Paternal Grandfather * : * :     Diet due to MI . * : Lung cancer * : Paternal Aunt * : . * : Brain cancer * : Paternal Aunt * : . * : Kidney disease * : Maternal Uncle * : . * : Hypertension * : Mother * : . * : CAD * : Father * : . * : Colon polyps * : Neg Hx * : . * : Diabetes * : Neg Hx * : . * : Gallbladder disease * : Neg Hx * : . * : Esophageal cancer * : Neg Hx * : REVIEW OF SYSTEMS: Negative unless as mentioned above. PHYSICAL EXAMINATION: Alert, awake, oriented to time, place, space. 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 No lateralizing abnormal neurological features noted otherwise. Station and gait normal. ASSESSMENT AND PLAN: The patient's CT angiogram of the head and neck was reviewed. This reveals no flow in the right internal carotid artery proximally and distally except for reconstitution in the supraclinoid segments as described above. Also noted were areas of smooth multiple irregularities involving the common carotid arteries bilaterally probably secondary to post radiation sequela. Discussion ensued regarding follow-up. Patient advised to continue on his present dual antiplatelets regimen in addition to his other prescribed medications. Patient advised to maintain adequate hydration. Diagnostic catheter arteriogram would be appropriate to more accurately evaluate for the  possibility of a string sign at some point. This will be scheduled as soon as possible. Electronically Signed   By: [name] M.D.   On: 12/18/2022 07:58    Labs:  CBC: Recent Labs    05/24/22 1018 12/01/22 1254 01/04/23 0919  WBC 4.3 5.3 4.9  HGB 15.0 13.3 14.3  HCT 43.7 40.4 42.1  PLT 331 270 248    COAGS: No results for input(s): "INR", "APTT" in the last 8760 hours.  BMP: Recent Labs    05/24/22 1018 12/01/22 1254 12/02/22 0355 01/04/23 0919  NA 145* 138 136 138  K 4.5 3.9 3.4* 4.1  CL 105 104 102 103  CO2 20 25 25 28   GLUCOSE 81 110* 112* 111*  BUN 11 14 12 16   CALCIUM 9.7 9.0 9.2 9.9  CREATININE 1.02 0.98 0.97 1.08  GFRNONAA  --  >60 >60 >60  LIVER FUNCTION TESTS: Recent Labs    05/24/22 1018 12/01/22 1254 01/04/23 0919  BILITOT 0.3 0.5 0.7  AST 29 21 22   ALT 13 14 13   ALKPHOS 86 51 71  PROT 7.9 7.3 8.2*  ALBUMIN 5.0* 4.4 5.0    TUMOR MARKERS: No results for input(s): "AFPTM", "CEA", "CA199", "CHROMGRNA" in the last 8760 hours.  Assessment and Plan:  Right ICA occlusion: Jonathan [name], 62 year old male, presents today to the [facility] Interventional Radiology department for an image-guided diagnostic cerebral angiogram.   Risks and benefits of this procedure were discussed with the patient including, but not limited to bleeding, infection, vascular injury or contrast induced renal failure. The risk of stroke was also discussed.   This interventional procedure involves the use of X-rays and because of the nature of the planned procedure, it is possible that we will have prolonged use of X-ray fluoroscopy.  Potential radiation risks to you include (but are not limited to) the following: - A slightly elevated risk for cancer  several years later in life. This risk is typically less than 0.5% percent. This risk is low in comparison to the normal incidence of human cancer, which is 33% for women and 50% for men according to the American  Cancer Society. - Radiation induced injury can include skin redness, resembling a rash, tissue breakdown / ulcers and hair loss (which can be temporary or permanent).   The likelihood of either of these occurring depends on the difficulty of the procedure and whether you are sensitive to radiation due to previous procedures, disease, or genetic conditions.   IF your procedure requires a prolonged use of radiation, you will be notified and given written instructions for further action.  It is your responsibility to monitor the irradiated area for the 2 weeks following the procedure and to notify your physician if you are concerned that you have suffered a radiation induced injury.    All of the patient's questions were answered, patient is agreeable to proceed. He has been NPO. He is a full code. Last dose of Aspirin and Plavix was yesterday.  Consent signed and in chart.  Thank you for this interesting consult.  I greatly enjoyed meeting Jonathan [name] and look forward to participating in their care.  A copy of this report was sent to the requesting provider on this date.  Electronically Signed: Alwyn Ren, AGACNP-BC [phone] 01/05/2023, 7:35 AM   I spent a total of  30 Minutes   in face to face in clinical consultation, greater than 50% of which was counseling/coordinating care for right ICA stenosis.

## 2023-01-05 ENCOUNTER — Other Ambulatory Visit: Payer: Self-pay

## 2023-01-05 ENCOUNTER — Other Ambulatory Visit (HOSPITAL_COMMUNITY): Payer: Self-pay | Admitting: Interventional Radiology

## 2023-01-05 ENCOUNTER — Ambulatory Visit (HOSPITAL_COMMUNITY)
Admission: RE | Admit: 2023-01-05 | Discharge: 2023-01-05 | Disposition: A | Discharge status: Home / Self Care | Payer: Medicaid Other | Source: Ambulatory Visit | Attending: Interventional Radiology | Admitting: Interventional Radiology

## 2023-01-05 DIAGNOSIS — I6521 Occlusion and stenosis of right carotid artery: Secondary | ICD-10-CM

## 2023-01-05 DIAGNOSIS — I10 Essential (primary) hypertension: Secondary | ICD-10-CM | POA: Diagnosis not present

## 2023-01-05 DIAGNOSIS — Z7902 Long term (current) use of antithrombotics/antiplatelets: Secondary | ICD-10-CM | POA: Insufficient documentation

## 2023-01-05 DIAGNOSIS — Z87891 Personal history of nicotine dependence: Secondary | ICD-10-CM | POA: Insufficient documentation

## 2023-01-05 DIAGNOSIS — Z7982 Long term (current) use of aspirin: Secondary | ICD-10-CM | POA: Diagnosis not present

## 2023-01-05 DIAGNOSIS — Z8521 Personal history of malignant neoplasm of larynx: Secondary | ICD-10-CM | POA: Insufficient documentation

## 2023-01-05 DIAGNOSIS — G453 Amaurosis fugax: Secondary | ICD-10-CM

## 2023-01-05 HISTORY — PX: IR US GUIDE VASC ACCESS RIGHT: IMG2390

## 2023-01-05 HISTORY — PX: IR ANGIO INTRA EXTRACRAN SEL COM CAROTID INNOMINATE BILAT MOD SED: IMG5360

## 2023-01-05 HISTORY — PX: IR ANGIO VERTEBRAL SEL VERTEBRAL UNI R MOD SED: IMG5368

## 2023-01-05 HISTORY — PX: IR ANGIO VERTEBRAL SEL SUBCLAVIAN INNOMINATE UNI L MOD SED: IMG5364

## 2023-01-05 LAB — PROTIME-INR
INR: 1 (ref 0.8–1.2)
Prothrombin Time: 13.3 s (ref 11.4–15.2)

## 2023-01-05 MED ORDER — CLOPIDOGREL BISULFATE 75 MG PO TABS
ORAL_TABLET | ORAL | Status: AC
  Filled 2023-01-05: qty 1

## 2023-01-05 MED ORDER — MIDAZOLAM HCL 2 MG/2ML IJ SOLN
INTRAMUSCULAR | Status: AC | PRN
Start: 2023-01-05 — End: 2023-01-05
  Administered 2023-01-05: 1 mg via INTRAVENOUS

## 2023-01-05 MED ORDER — ASPIRIN 81 MG PO CHEW
CHEWABLE_TABLET | ORAL | Status: AC
  Filled 2023-01-05: qty 1

## 2023-01-05 MED ORDER — LIDOCAINE HCL 1 % IJ SOLN
INTRAMUSCULAR | Status: AC
  Filled 2023-01-05: qty 20

## 2023-01-05 MED ORDER — IOHEXOL 300 MG/ML  SOLN
150.0000 mL | Freq: Once | INTRAMUSCULAR | Status: AC | PRN
  Administered 2023-01-05: 75 mL via INTRA_ARTERIAL

## 2023-01-05 MED ORDER — HEPARIN SODIUM (PORCINE) 1000 UNIT/ML IJ SOLN
INTRAMUSCULAR | Status: AC
  Filled 2023-01-05: qty 10

## 2023-01-05 MED ORDER — VERAPAMIL HCL 2.5 MG/ML IV SOLN
INTRAVENOUS | Status: AC
  Filled 2023-01-05: qty 2

## 2023-01-05 MED ORDER — VERAPAMIL HCL 2.5 MG/ML IV SOLN
INTRA_ARTERIAL | Status: AC | PRN
  Administered 2023-01-05: 6 mL via INTRA_ARTERIAL

## 2023-01-05 MED ORDER — FENTANYL CITRATE (PF) 100 MCG/2ML IJ SOLN
INTRAMUSCULAR | Status: AC | PRN
Start: 2023-01-05 — End: 2023-01-05
  Administered 2023-01-05: 25 ug via INTRAVENOUS

## 2023-01-05 MED ORDER — ASPIRIN 81 MG PO CHEW
81.0000 mg | CHEWABLE_TABLET | Freq: Once | ORAL | Status: AC
  Administered 2023-01-05: 81 mg via ORAL

## 2023-01-05 MED ORDER — MIDAZOLAM HCL 2 MG/2ML IJ SOLN
INTRAMUSCULAR | Status: AC
  Filled 2023-01-05: qty 2

## 2023-01-05 MED ORDER — CLOPIDOGREL BISULFATE 75 MG PO TABS
75.0000 mg | ORAL_TABLET | Freq: Every day | ORAL | Status: DC
  Administered 2023-01-05: 75 mg via ORAL

## 2023-01-05 MED ORDER — SODIUM CHLORIDE 0.9 % IV SOLN: Freq: Once | INTRAVENOUS | Status: AC

## 2023-01-05 MED ORDER — NITROGLYCERIN 1 MG/10 ML FOR IR/CATH LAB
INTRA_ARTERIAL | Status: AC
  Filled 2023-01-05: qty 10

## 2023-01-05 MED ORDER — SODIUM CHLORIDE 0.9 % IV SOLN: INTRAVENOUS | Status: DC

## 2023-01-05 MED ORDER — FENTANYL CITRATE (PF) 100 MCG/2ML IJ SOLN
INTRAMUSCULAR | Status: AC
  Filled 2023-01-05: qty 2

## 2023-01-05 NOTE — Discharge Instructions (Signed)

## 2023-01-05 NOTE — Procedures (Signed)
INR.  Status post four-vessel cerebral arteriogram.  Right radial approach.  Findings.  1.  Occluded right internal carotid artery at the  bulb without angiographic evidence of a delayed string sign.  2.  Distal reconstitution of the right internal carotid artery , the right middle cerebral artery via the iACOM  from the left ICA, and the right P-comm via the right vertebral artery retrogradely.  No acute complications.  Patient  tolerated the procedure well.  Fatima Sanger MD.

## 2023-01-10 DIAGNOSIS — Z430 Encounter for attention to tracheostomy: Secondary | ICD-10-CM | POA: Diagnosis not present

## 2023-01-18 ENCOUNTER — Other Ambulatory Visit: Payer: Self-pay | Admitting: Internal Medicine

## 2023-01-18 DIAGNOSIS — E032 Hypothyroidism due to medicaments and other exogenous substances: Secondary | ICD-10-CM

## 2023-02-10 ENCOUNTER — Other Ambulatory Visit: Payer: Self-pay | Admitting: Internal Medicine

## 2023-02-10 DIAGNOSIS — K219 Gastro-esophageal reflux disease without esophagitis: Secondary | ICD-10-CM

## 2023-02-14 NOTE — Telephone Encounter (Signed)
Next appt scheduled 12/11 with PCP.

## 2023-03-14 ENCOUNTER — Ambulatory Visit (INDEPENDENT_AMBULATORY_CARE_PROVIDER_SITE_OTHER): Payer: Medicaid Other | Admitting: Internal Medicine

## 2023-03-14 ENCOUNTER — Encounter: Payer: Self-pay | Admitting: Internal Medicine

## 2023-03-14 VITALS — BP 132/89 | HR 92 | Temp 98.8°F | Ht 65.0 in | Wt 129.6 lb

## 2023-03-14 DIAGNOSIS — E032 Hypothyroidism due to medicaments and other exogenous substances: Secondary | ICD-10-CM | POA: Diagnosis not present

## 2023-03-14 DIAGNOSIS — Z87891 Personal history of nicotine dependence: Secondary | ICD-10-CM | POA: Diagnosis not present

## 2023-03-14 DIAGNOSIS — I6521 Occlusion and stenosis of right carotid artery: Secondary | ICD-10-CM | POA: Diagnosis not present

## 2023-03-14 DIAGNOSIS — E785 Hyperlipidemia, unspecified: Secondary | ICD-10-CM

## 2023-03-14 DIAGNOSIS — M170 Bilateral primary osteoarthritis of knee: Secondary | ICD-10-CM

## 2023-03-14 DIAGNOSIS — I10 Essential (primary) hypertension: Secondary | ICD-10-CM

## 2023-03-14 DIAGNOSIS — R7303 Prediabetes: Secondary | ICD-10-CM | POA: Diagnosis not present

## 2023-03-14 DIAGNOSIS — E7841 Elevated Lipoprotein(a): Secondary | ICD-10-CM

## 2023-03-14 NOTE — Patient Instructions (Signed)
Mr. Drayton,  It was a pleasure to see you as always. Please schedule an appointment to see me again sometime in the next 3 months at your convenience. I will call you with your lab results.   If you have any questions or concerns, call our clinic at [phone] or after hours call [phone] and ask for the internal medicine resident on call.   Thank you!  Dr. Reece Agar

## 2023-03-14 NOTE — Assessment & Plan Note (Signed)
Asymptomatic, doing well on synthroid 112 mcg daily. Taking appropriately first thing in the morning on an empty stomach. Last TSH checked four months ago was WNL at 4.14. Will go ahead and recheck today with lipid lab draw. He has required multiple dose changes over the past few years to achieve euthyroid state. If repeat TSH is stable today can likely space out checks every 6-12 months.

## 2023-03-14 NOTE — Assessment & Plan Note (Signed)
Prediabetes - Hgb A1c 6.2 three months ago  - Recheck at next visit

## 2023-03-14 NOTE — Assessment & Plan Note (Signed)
-   Recheck lipid panel today; LDL goal < 70 for secondary prevention  - LDL previously elevated but was inconsistently taking crestor at that time. - Since his recent stroke, has been taking daily.

## 2023-03-14 NOTE — Progress Notes (Signed)
Subjective:   Patient ID: Jonathan [name] male   DOB: 21-Dec-1960 62 y.o.   MRN: [id number]  HPI: Jonathan [name] is a 62 y.o. male with past medical history outlined below here for follow up of his chronic conditions. For further details of today's visit, please refer to the assessment and plan below.  Past Medical History:  Diagnosis Date   Allergic rhinitis    Arthritis    Bilateral knee arthritis for about 20 years. Not taking any medications, believes in nature processes   Colon polyp    Dental caries    prechemoradiation therapy, periodontitis   Elevated blood pressure reading    White Coat Hypertension   GERD (gastroesophageal reflux disease)    Headache    History of radiation therapy 09/11/16- 10/30/16   Larynx 70 Gy in 35 fractions   HLD (hyperlipidemia)    Hypothyroidism    radiation induced hypothyroidism   Internal hemorrhoids    Laryngeal cancer (HCC)    Pneumonia    as a child   Tubular adenoma of colon    Current Outpatient Medications  Medication Sig Dispense Refill   acetaminophen (TYLENOL) 500 MG tablet Take 1,000 mg by mouth every 6 (six) hours as needed for moderate pain or headache.     amlodipine-olmesartan (AZOR) 10-20 MG tablet TAKE 1 TABLET BY MOUTH DAILY 90 tablet 3   aspirin EC 81 MG tablet Take 1 tablet (81 mg total) by mouth daily. Swallow whole. 90 tablet 3   levothyroxine (SYNTHROID) 112 MCG tablet TAKE 1 TABLET(112 MCG) BY MOUTH DAILY 90 tablet 3   omeprazole (PRILOSEC) 20 MG capsule TAKE 1 CAPSULE(20 MG) BY MOUTH DAILY 90 capsule 1   rosuvastatin (CRESTOR) 40 MG tablet TAKE 1 TABLET(40 MG) BY MOUTH DAILY 30 tablet 11   No current facility-administered medications for this visit.   Family History  Problem Relation Age of Onset   Hypertension Mother    CAD Father    Prostate cancer Maternal Uncle 68       On radiation therapy now, diagnosed in around June 2012   Colon cancer Maternal Uncle    Kidney disease Maternal Uncle    Lung cancer  Paternal Aunt    Brain cancer Paternal Aunt    Heart attack Paternal Grandfather        Diet due to MI   Colon polyps Neg Hx    Diabetes Neg Hx    Gallbladder disease Neg Hx    Esophageal cancer Neg Hx    Stroke Neg Hx    Social History   Socioeconomic History   Marital status: Single    Spouse name: Not on file   Number of children: 4   Years of education: Not on file   Highest education level: Not on file  Occupational History   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   Drug use: No    Frequency: 2.0 times per week    Comment: denies   Sexual activity: Yes    Partners: Female  Other Topics Concern   Not on file  Social History Narrative   Lives in [place] with his fiance.   Moved to [place] 11 years before. Used to live in [place] for 20 years before that.   Has 4 healthy kids from previous wife- separated now.   Is not working full-time since last 2 and half years- since early 2010.   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   Social Determinants of Health   Financial Resource Strain: Low Risk  (05/24/2022)   Overall Financial Resource Strain (CARDIA)    Difficulty of Paying Living Expenses: Not very hard  Food Insecurity: No Food Insecurity (12/01/2022)   Hunger Vital Sign    Worried About Running Out of Food in the Last Year: Never true    Ran Out of Food in the Last Year: Never true  Transportation Needs: No Transportation Needs (12/01/2022)   PRAPARE - Administrator, Civil Service (Medical): No    Lack of Transportation  (Non-Medical): No  Physical Activity: Sufficiently Active (05/24/2022)   Exercise Vital Sign    Days of Exercise per Week: 7 days    Minutes of Exercise per Session: 40 min  Stress: No Stress Concern Present (05/24/2022)   [employer] of Occupational Health - Occupational Stress Questionnaire    Feeling of Stress : Only a little  Social Connections: Moderately Integrated (05/24/2022)   Social Connection and Isolation Panel [NHANES]    Frequency of Communication with Friends and Family: More than three times a week    Frequency of Social Gatherings with Friends and Family: More than three times a week    Attends Religious Services: More than 4 times per year    Active Member of [employer] or Organizations: No    Attends Banker Meetings: Never    Marital Status: Living with partner     Objective:  Physical Exam:  Vitals:   03/14/23 1042  BP: 132/89  Pulse: 92  Temp: 98.8 F (37.1 C)  TempSrc: Oral  SpO2: 98%  Weight: 129 lb 9.6 oz (58.8 kg)  Height: 5\' 5"  (1.651 m)    Constitutional: Appears well, NAD HEENT: Tracheostomy speaking valve, stoma is clean  Cardiovascular: RRR, no m/r/g Pulmonary/Chest: Clear bilaterally, normal effort Extremities: Warm, no edema     Assessment & Plan:   ICAO (internal carotid artery occlusion), right Hospitalized in September for right internal carotid artery occlusion; presented as acute painless monocular vision loss. Treated with DAPT - ASA and palvix x 3 months. Has 2 days left of plavix to finish course. After this, instructed continue ASA 81 mg daily alone. Placed new referral to optho. Says he did not hear from the prior referral.  Would likely benefit from establishing although vision loss has resolved.   Prediabetes Prediabetes - Hgb A1c 6.2 three months ago  - Recheck at next visit   White coat syndrome with diagnosis of hypertension BP well controlled today on amlodipine-olmesartan. Recent labs with normal kidney  function and electrolytes. Continue .  Hypothyroidism, iatrogenic Asymptomatic, doing well on synthroid 112 mcg daily. Taking appropriately first thing in the morning on an empty stomach. Last TSH checked four months ago was WNL at 4.14. Will go ahead and recheck today with lipid lab draw. He has required multiple dose changes over the past few years to achieve euthyroid state. If repeat TSH is stable today can likely space out checks every 6-12 months.  Bilateral primary osteoarthritis of knee Patient has bilateral knee OA and responds very well to intraarticular steroid injects which he receives 1-2 times per year. This usually provides long lasting relief. Last injection was over a year ago. Today he reports his left knee is doing well. Right knee has been giving him "trouble", but prefers to hold off on injection today. Will plan for bilateral injections at next visit with me if needed.   HLD (hyperlipidemia) - Recheck lipid panel today; LDL goal < 70 for secondary prevention  - LDL previously elevated but was inconsistently taking crestor at that time. - Since his recent stroke, has been taking daily.

## 2023-03-14 NOTE — Assessment & Plan Note (Signed)
BP well controlled today on amlodipine-olmesartan. Recent labs with normal kidney function and electrolytes. Continue .

## 2023-03-14 NOTE — Assessment & Plan Note (Signed)
Patient has bilateral knee OA and responds very well to intraarticular steroid injects which he receives 1-2 times per year. This usually provides long lasting relief. Last injection was over a year ago. Today he reports his left knee is doing well. Right knee has been giving him "trouble", but prefers to hold off on injection today. Will plan for bilateral injections at next visit with me if needed.

## 2023-03-14 NOTE — Assessment & Plan Note (Addendum)
Hospitalized in September for right internal carotid artery occlusion; presented as acute painless monocular vision loss. Treated with DAPT - ASA and palvix x 3 months. Has 2 days left of plavix to finish course. After this, instructed continue ASA 81 mg daily alone. Placed new referral to optho. Says he did not hear from the prior referral.  Would likely benefit from establishing although vision loss has resolved.

## 2023-03-16 LAB — LIPID PANEL
Chol/HDL Ratio: 2.8 {ratio} (ref 0.0–5.0)
Cholesterol, Total: 179 mg/dL (ref 100–199)
HDL: 64 mg/dL (ref >39)
LDL Chol Calc (NIH): 98 mg/dL (ref 0–99)
Triglycerides: 94 mg/dL (ref 0–149)
VLDL Cholesterol Cal: 17 mg/dL (ref 5–40)

## 2023-03-16 LAB — TSH: TSH: 0.72 u[IU]/mL (ref 0.450–4.500)

## 2023-06-13 ENCOUNTER — Ambulatory Visit (INDEPENDENT_AMBULATORY_CARE_PROVIDER_SITE_OTHER): Payer: Medicaid Other | Admitting: Internal Medicine

## 2023-06-13 ENCOUNTER — Encounter: Payer: Self-pay | Admitting: Internal Medicine

## 2023-06-13 VITALS — BP 150/99 | HR 94 | Temp 98.5°F | Ht 65.0 in | Wt 127.2 lb

## 2023-06-13 DIAGNOSIS — E7841 Elevated Lipoprotein(a): Secondary | ICD-10-CM

## 2023-06-13 DIAGNOSIS — I10 Essential (primary) hypertension: Secondary | ICD-10-CM | POA: Diagnosis not present

## 2023-06-13 DIAGNOSIS — M170 Bilateral primary osteoarthritis of knee: Secondary | ICD-10-CM

## 2023-06-13 DIAGNOSIS — E032 Hypothyroidism due to medicaments and other exogenous substances: Secondary | ICD-10-CM

## 2023-06-13 DIAGNOSIS — R7303 Prediabetes: Secondary | ICD-10-CM | POA: Diagnosis not present

## 2023-06-13 DIAGNOSIS — E785 Hyperlipidemia, unspecified: Secondary | ICD-10-CM

## 2023-06-13 DIAGNOSIS — I6521 Occlusion and stenosis of right carotid artery: Secondary | ICD-10-CM

## 2023-06-13 LAB — POCT GLYCOSYLATED HEMOGLOBIN (HGB A1C): Hemoglobin A1C: 5.9 % — AB (ref 4.0–5.6)

## 2023-06-13 LAB — GLUCOSE, CAPILLARY: Glucose-Capillary: 111 mg/dL — ABNORMAL HIGH (ref 70–99)

## 2023-06-13 NOTE — Assessment & Plan Note (Signed)
 Taking Crestor 40 mg daily for secondary prevention (history of small vessel CVA / ICOA). Historically LDL not at goal; rechecking lipid panel today. He reports compliance. Plan to add zetia if LDL still > 70.

## 2023-06-13 NOTE — Progress Notes (Signed)
 Subjective:   Patient ID: Jonathan [name] male   DOB: 18-Jan-1961 63 y.o.   MRN: [id number]  HPI: [name] is a 63 y.o. male with past medical history outlined below here for follow up of chronic conditions and knee pain. For further details of today's visit, please refer to the assessment and plan below.   Past Medical History:  Diagnosis Date   Allergic rhinitis    Arthritis    Bilateral knee arthritis for about 20 years. Not taking any medications, believes in nature processes   Colon polyp    Dental caries    prechemoradiation therapy, periodontitis   Elevated blood pressure reading    White Coat Hypertension   GERD (gastroesophageal reflux disease)    Headache    History of radiation therapy 09/11/16- 10/30/16   Larynx 70 Gy in 35 fractions   HLD (hyperlipidemia)    Hypothyroidism    radiation induced hypothyroidism   Internal hemorrhoids    Laryngeal cancer (HCC)    Pneumonia    as a child   Tubular adenoma of colon    Current Outpatient Medications  Medication Sig Dispense Refill   acetaminophen (TYLENOL) 500 MG tablet Take 1,000 mg by mouth every 6 (six) hours as needed for moderate pain or headache.     amlodipine-olmesartan (AZOR) 10-20 MG tablet TAKE 1 TABLET BY MOUTH DAILY 90 tablet 3   aspirin EC 81 MG tablet Take 1 tablet (81 mg total) by mouth daily. Swallow whole. 90 tablet 3   levothyroxine (SYNTHROID) 112 MCG tablet TAKE 1 TABLET(112 MCG) BY MOUTH DAILY 90 tablet 3   omeprazole (PRILOSEC) 20 MG capsule TAKE 1 CAPSULE(20 MG) BY MOUTH DAILY 90 capsule 1   rosuvastatin (CRESTOR) 40 MG tablet TAKE 1 TABLET(40 MG) BY MOUTH DAILY 30 tablet 11   No current facility-administered medications for this visit.   Family History  Problem Relation Age of Onset   Hypertension Mother    CAD Father    Prostate cancer Maternal Uncle 68       On radiation therapy now, diagnosed in around June 2012   Colon cancer Maternal Uncle    Kidney disease Maternal Uncle    Lung  cancer Paternal Aunt    Brain cancer Paternal Aunt    Heart attack Paternal Grandfather        Diet due to MI   Colon polyps Neg Hx    Diabetes Neg Hx    Gallbladder disease Neg Hx    Esophageal cancer Neg Hx    Stroke Neg Hx    Social History   Socioeconomic History   Marital status: Single    Spouse name: Not on file   Number of children: 4   Years of education: Not on file   Highest education level: Not on file  Occupational History   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   Drug use: No    Frequency: 2.0 times per week    Comment: denies   Sexual activity: Yes  Partners: Female  Other Topics Concern   Not on file  Social History Narrative   Lives in [place] with his fiance.   Moved to [place] 11 years before. Used to live in [place] for 20 years before that.   Has 4 healthy kids from previous wife- separated now.   Is not working full-time since last 2 and half years- since early 2010.   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   Social Drivers of Corporate investment banker Strain: Low Risk  (05/24/2022)   Overall Financial Resource Strain (CARDIA)    Difficulty of Paying Living Expenses: Not very hard  Food Insecurity: No Food Insecurity (12/01/2022)   Hunger Vital Sign    Worried About Running Out of Food in the Last Year: Never true    Ran Out of Food in the Last Year: Never true  Transportation Needs: No Transportation Needs (12/01/2022)   PRAPARE - Administrator, Civil Service (Medical): No    Lack of Transportation  (Non-Medical): No  Physical Activity: Sufficiently Active (05/24/2022)   Exercise Vital Sign    Days of Exercise per Week: 7 days    Minutes of Exercise per Session: 40 min  Stress: No Stress Concern Present (05/24/2022)   [employer] of Occupational Health - Occupational Stress Questionnaire    Feeling of Stress : Only a little  Social Connections: Moderately Integrated (05/24/2022)   Social Connection and Isolation Panel [NHANES]    Frequency of Communication with Friends and Family: More than three times a week    Frequency of Social Gatherings with Friends and Family: More than three times a week    Attends Religious Services: More than 4 times per year    Active Member of Clubs or Organizations: No    Attends Banker Meetings: Never    Marital Status: Living with partner     Objective:  Physical Exam:  Vitals:   06/13/23 0947  BP: (!) 150/99  Pulse: 94  Temp: 98.5 F (36.9 C)  TempSrc: Oral  SpO2: 96%  Weight: 127 lb 3.2 oz (57.7 kg)  Height: 5\' 5"  (1.651 m)    Constitutional: Appears well, NAD HEENT: Tracheostomy speaking valve, stoma is clean  Cardiovascular: RRR, no m/r/g Pulmonary/Chest: Clear bilaterally, normal effort Extremities: Warm, no edema. Bilateral knees with some bony enlargement consistent with known arthropathy; no effusions  Assessment & Plan:   HLD (hyperlipidemia) Taking Crestor 40 mg daily for secondary prevention (history of small vessel CVA / ICOA). Historically LDL not at goal; rechecking lipid panel today. He reports compliance. Plan to add zetia if LDL still > 70.   Prediabetes Hgb A1c stable in the prediabetic range at 5.9. Recommend annual monitoring.   White coat syndrome with diagnosis of hypertension BP elevated today however he has well documented white coat hypertension and he monitors BP at home daily or multiple times per day. Recently he has been having issues with hypotension, systolics down to the 80s with  symptoms of dizziness. No recent illness and he reports good Po hydration. He appropriately did not take his antihypertensives on these days when BP was low. When BP > 125 systolic he will take amlodipine-olmesartan. He has notice a direct correlation with his diet; seems to be a very "salt sensitive" hypertensive patient. Overall BP has been challenging but I do think he is more controlled that what our office readings reflect. No changes today. He will  continue daily BP checks at home.  Hypothyroidism, iatrogenic Asymptomatic, last TSH check at goal. He is taking synthroid 112 mcg daily as instructed. Repeat TSH every 6-12 months.   Bilateral primary osteoarthritis of knee Patient has bilateral knee OA and responds very well to intraarticular steroid injects which he receives 1-2 times per year. This usually provides long lasting relief. Last injection was over a year ago. Plan for repeat injections today.   Procedure Note   Indication:  Bilateral Knee Pain from OA    Operators: Drs Antony Contras Sloan Leiter   The patient was provided with risks, benefits, and alternatives to intraarticular injection. He consented to bilateral intraarticular knee injection for osteoarthritis.  After a time out was preformed, both knees were prepped in a sterile fashion. Cold spray was applied to the skin over the insertion site (lateral inferior patellar aspect of the knee while in the seated position). A mixture of 2 cc 1% xylocaine and 40 mg of Kenalog were injected into each knee space using a 25 gauge 1 and 1/2 inch needle.  Both knee spaces were entered successfully without difficulty, entered successfully after first attempts. The patient tolerated the procedures well without complication.

## 2023-06-13 NOTE — Assessment & Plan Note (Signed)
 Asymptomatic, last TSH check at goal. He is taking synthroid 112 mcg daily as instructed. Repeat TSH every 6-12 months.

## 2023-06-13 NOTE — Assessment & Plan Note (Signed)
 Hgb A1c stable in the prediabetic range at 5.9. Recommend annual monitoring.

## 2023-06-13 NOTE — Assessment & Plan Note (Signed)
 BP elevated today however he has well documented white coat hypertension and he monitors BP at home daily or multiple times per day. Recently he has been having issues with hypotension, systolics down to the 80s with symptoms of dizziness. No recent illness and he reports good Po hydration. He appropriately did not take his antihypertensives on these days when BP was low. When BP > 125 systolic he will take amlodipine-olmesartan. He has notice a direct correlation with his diet; seems to be a very "salt sensitive" hypertensive patient. Overall BP has been challenging but I do think he is more controlled that what our office readings reflect. No changes today. He will continue daily BP checks at home.

## 2023-06-13 NOTE — Patient Instructions (Signed)
 Mr. Morrish,  It was a pleasure to see you. I wish you the best of luck! I will call you with the results of your blood work. If you ever need anything from me in the future, please don't hesitate to reach out. My cell is [phone].  Take care,  Dr. Antony Contras

## 2023-06-13 NOTE — Assessment & Plan Note (Signed)
 Patient has bilateral knee OA and responds very well to intraarticular steroid injects which he receives 1-2 times per year. This usually provides long lasting relief. Last injection was over a year ago. Plan for repeat injections today.   Procedure Note   Indication:  Bilateral Knee Pain from OA    Operators: Drs Antony Contras Sloan Leiter   The patient was provided with risks, benefits, and alternatives to intraarticular injection. He consented to bilateral intraarticular knee injection for osteoarthritis.  After a time out was preformed, both knees were prepped in a sterile fashion. Cold spray was applied to the skin over the insertion site (lateral inferior patellar aspect of the knee while in the seated position). A mixture of 2 cc 1% xylocaine and 40 mg of Kenalog were injected into each knee space using a 25 gauge 1 and 1/2 inch needle.  Both knee spaces were entered successfully without difficulty, entered successfully after first attempts. The patient tolerated the procedures well without complication.

## 2023-06-15 LAB — LIPID PANEL
Chol/HDL Ratio: 2.9 ratio (ref 0.0–5.0)
Cholesterol, Total: 176 mg/dL (ref 100–199)
HDL: 60 mg/dL (ref >39)
LDL Chol Calc (NIH): 101 mg/dL — ABNORMAL HIGH (ref 0–99)
Triglycerides: 81 mg/dL (ref 0–149)
VLDL Cholesterol Cal: 15 mg/dL (ref 5–40)

## 2023-06-18 ENCOUNTER — Other Ambulatory Visit: Payer: Self-pay | Admitting: Internal Medicine

## 2023-06-18 DIAGNOSIS — I6521 Occlusion and stenosis of right carotid artery: Secondary | ICD-10-CM

## 2023-06-18 DIAGNOSIS — E7841 Elevated Lipoprotein(a): Secondary | ICD-10-CM

## 2023-06-18 MED ORDER — EZETIMIBE 10 MG PO TABS: 10.0000 mg | ORAL_TABLET | Freq: Every day | ORAL | 11 refills | Status: DC

## 2023-08-01 ENCOUNTER — Encounter: Payer: Self-pay | Admitting: Adult Health

## 2023-08-01 ENCOUNTER — Ambulatory Visit: Payer: Medicaid Other | Admitting: Adult Health

## 2023-09-28 ENCOUNTER — Encounter (HOSPITAL_COMMUNITY): Payer: Self-pay | Admitting: Interventional Radiology

## 2023-10-03 ENCOUNTER — Other Ambulatory Visit: Payer: Self-pay | Admitting: Student

## 2023-10-03 ENCOUNTER — Other Ambulatory Visit: Payer: Self-pay

## 2023-10-03 DIAGNOSIS — E032 Hypothyroidism due to medicaments and other exogenous substances: Secondary | ICD-10-CM

## 2023-10-03 MED ORDER — LEVOTHYROXINE SODIUM 112 MCG PO TABS: 112.0000 ug | ORAL_TABLET | Freq: Every day | ORAL | 3 refills | Status: AC

## 2023-10-03 NOTE — Telephone Encounter (Signed)
 Medication discontinued 03/14/23

## 2023-12-14 ENCOUNTER — Ambulatory Visit (INDEPENDENT_AMBULATORY_CARE_PROVIDER_SITE_OTHER): Admitting: Student

## 2023-12-14 VITALS — BP 157/88 | HR 86 | Temp 99.0°F | Ht 65.0 in | Wt 129.6 lb

## 2023-12-14 DIAGNOSIS — Z87891 Personal history of nicotine dependence: Secondary | ICD-10-CM

## 2023-12-14 DIAGNOSIS — E785 Hyperlipidemia, unspecified: Secondary | ICD-10-CM

## 2023-12-14 DIAGNOSIS — Z8249 Family history of ischemic heart disease and other diseases of the circulatory system: Secondary | ICD-10-CM

## 2023-12-14 DIAGNOSIS — I10 Essential (primary) hypertension: Secondary | ICD-10-CM

## 2023-12-14 DIAGNOSIS — E032 Hypothyroidism due to medicaments and other exogenous substances: Secondary | ICD-10-CM | POA: Diagnosis not present

## 2023-12-14 DIAGNOSIS — Z79899 Other long term (current) drug therapy: Secondary | ICD-10-CM

## 2023-12-14 DIAGNOSIS — Z79890 Hormone replacement therapy (postmenopausal): Secondary | ICD-10-CM

## 2023-12-14 MED ORDER — AMLODIPINE BESYLATE 10 MG PO TABS: 10.0000 mg | ORAL_TABLET | Freq: Every day | ORAL | 0 refills | Status: DC | PRN

## 2023-12-14 MED ORDER — OLMESARTAN MEDOXOMIL 20 MG PO TABS: 20.0000 mg | ORAL_TABLET | Freq: Every day | ORAL | 3 refills | Status: AC

## 2023-12-14 NOTE — Progress Notes (Signed)
 Established Patient Office Visit  Subjective   Patient ID: Jonathan [name], male    DOB: September 18, 1960  Age: 63 y.o. MRN: [id number]  Chief Complaint  Patient presents with   Hypertension    Jonathan [name] is a 63 y.o. who presents to the clinic for a follow up of HTN. Please see problem based assessment and plan for additional details.    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  Constitutional:      General: He is not in acute distress.    Appearance: He is normal weight. He is not ill-appearing, toxic-appearing or diaphoretic.  Cardiovascular:     Rate and Rhythm: Normal rate and regular rhythm.     Heart sounds: No murmur heard. Pulmonary:     Effort: Pulmonary effort is normal. No respiratory  distress.     Breath sounds: Normal breath sounds. No wheezing or rales.  Musculoskeletal:     Right lower leg: No edema.     Left lower leg: No edema.  Skin:    General: Skin is warm and dry.  Neurological:     Mental Status: He is alert.  Psychiatric:        Mood and Affect: Mood normal.      No results found for any visits on 12/14/23.  Last metabolic panel Lab Results  Component Value Date   GLUCOSE 111 (H) 01/04/2023   NA 138 01/04/2023   K 4.1 01/04/2023   CL 103 01/04/2023   CO2 28 01/04/2023   BUN 16 01/04/2023   CREATININE 1.08 01/04/2023   GFRNONAA >60 01/04/2023   CALCIUM  9.9 01/04/2023   PHOS 3.2 08/11/2017   PROT 8.2 (H) 01/04/2023   ALBUMIN 5.0 01/04/2023   LABGLOB 2.9 05/24/2022   AGRATIO 1.7 05/24/2022   BILITOT 0.7 01/04/2023   ALKPHOS 71 01/04/2023   AST 22 01/04/2023   ALT 13 01/04/2023   ANIONGAP 7 01/04/2023   Last lipids Lab Results  Component Value Date   CHOL 176 06/13/2023   HDL 60 06/13/2023   LDLCALC 101 (H) 06/13/2023   TRIG 81 06/13/2023   CHOLHDL 2.9 06/13/2023   Last hemoglobin  A1c Lab Results  Component Value Date   HGBA1C 5.9 (A) 06/13/2023      The 10-year ASCVD risk score (Arnett DK, et al., 2019) is: 21%    Assessment & Plan:   Problem List Items Addressed This Visit       Cardiovascular and Mediastinum   White coat syndrome with diagnosis of hypertension - Primary   Patient presents with a history of hypertension with a blood pressure today of 175/98, rechecked at 157/88. Prior BMP in October 2024, Scr was 1.08. They check their blood pressure at home, with readings in the 120s over 80s.  Patient reported that he only takes his blood pressure medication and if the blood pressure is greater than 130/85.  Patient stated that whenever he takes his blood pressure at home for more than 3 days, his blood pressure becomes hypotensive with readings below 90 over 60s and during this time he feels dizzy.  He stated that he  takes his blood pressure medications for about 3-4 out of the 7 days a week.  We reviewed the dispense history which does not show amlodipine  olmesartan  10-20 dispenses within the past 6 months, patient reports that he has been taking medications that were prescribed about 1 year ago.  Overall, his blood pressure is quite difficult to control.  A large concern I have is hypotension at home.  That being said, we will discontinue the combination amlodipine -olmesartan  medication and prescribe olmesartan  20 mg daily.    Plan: -Continue Olmesartan  20mg  , will prescribe amlodipine  10 mg as needed if patient's blood pressure is greater than 130/80 after he takes olmesartan  20 mg (patient was instructed to check his blood pressure at least 2 hours after taking olmesartan  20 mg) -Patient will return to the office in 2 weeks for blood pressure check, patient was instructed to bring a log of his blood pressures and as well as his cuff to check for accuracy -BMP today       Relevant Medications   olmesartan  (BENICAR ) 20 MG tablet   amLODipine  (NORVASC ) 10 MG tablet   Other Relevant Orders   Basic metabolic panel with GFR     Endocrine   Hypothyroidism, iatrogenic (Chronic)   Patient has a history of hypothyroidism on a regimen of Synthroid  112 mcg daily.  Denies symptoms of hypothyroidism this time.  His last TSH was checked in March and was 0.720.   Plan: - Recheck TSH today      Relevant Orders   TSH     Other   HLD (hyperlipidemia) (Chronic)   Patient is taking Crestor  40 mg and Zetia  10 mg.  Zetia  10 mg was added after his prior lipid profile in March showed that his LDL was not at goal of less than 70. Plan: - Will check a lipid profile today now that he is on Crestor  40 mg and Zetia  10 mg      Relevant Medications   olmesartan  (BENICAR ) 20 MG tablet   amLODipine  (NORVASC ) 10 MG tablet   Other Relevant Orders   Lipid Profile    Return for 2 weeks BP check, 4 weeks with provider .     Damien Lease, DO

## 2023-12-14 NOTE — Assessment & Plan Note (Signed)
 Patient presents with a history of hypertension with a blood pressure today of 175/98, rechecked at 157/88. Prior BMP in October 2024, Scr was 1.08. They check their blood pressure at home, with readings in the 120s over 80s.  Patient reported that he only takes his blood pressure medication and if the blood pressure is greater than 130/85.  Patient stated that whenever he takes his blood pressure at home for more than 3 days, his blood pressure becomes hypotensive with readings below 90 over 60s and during this time he feels dizzy.  He stated that he takes his blood pressure medications for about 3-4 out of the 7 days a week.  We reviewed the dispense history which does not show amlodipine  olmesartan  10-20 dispenses within the past 6 months, patient reports that he has been taking medications that were prescribed about 1 year ago.  Overall, his blood pressure is quite difficult to control.  A large concern I have is hypotension at home.  That being said, we will discontinue the combination amlodipine -olmesartan  medication and prescribe olmesartan  20 mg daily.    Plan: -Continue Olmesartan  20mg  , will prescribe amlodipine  10 mg as needed if patient's blood pressure is greater than 130/80 after he takes olmesartan  20 mg (patient was instructed to check his blood pressure at least 2 hours after taking olmesartan  20 mg) -Patient will return to the office in 2 weeks for blood pressure check, patient was instructed to bring a log of his blood pressures and as well as his cuff to check for accuracy -BMP today

## 2023-12-14 NOTE — Progress Notes (Signed)
 Internal Medicine Clinic Attending  Case discussed with the resident at the time of the visit.  We reviewed the resident's history and exam and pertinent patient test results.  I agree with the assessment, diagnosis, and plan of care documented in the resident's note.  Pt has a long history of hypertension that was previously attributed to white coat hypertension with pt reports of normal BP at home although home BP cuff has never been calibrated in office. While in the hospital, he was monitored off his BP medications for the purpose of permissive hypertension and his Bps have ranged in similar values that we are seeing in office today which makes me question his true adherence to this medication. He cites concerns for labile BP as the reason that he skips doses and so we will decrease his BP medication today to encourage daily adherence to this medication and less fear of hypotension. Emphasized the importance of taking this medication on a daily basis.   Rest as below.

## 2023-12-14 NOTE — Assessment & Plan Note (Signed)
 Patient is taking Crestor  40 mg and Zetia  10 mg.  Zetia  10 mg was added after his prior lipid profile in March showed that his LDL was not at goal of less than 70. Plan: - Will check a lipid profile today now that he is on Crestor  40 mg and Zetia  10 mg

## 2023-12-14 NOTE — Patient Instructions (Signed)
 Thank you, [name] for allowing us  to provide your care today. Today we discussed blood pressure and thyroid  disease.  Please take olmesartan  20 mg daily. This is different than your home medication of amlodipine -olmesartan  combined. Check your blood pressure daily. Please take one tablet of amlodipine  10 mg if your blood pressure is greater than 130/80. ONLY take this AFTER you check your blood pressure 2 hours after taking olmesartan      BRING your cuff and blood pressure log with you to your next appointment!   I have ordered the following labs for you:   Lab Orders         Basic metabolic panel with GFR         TSH         Lipid Profile       Referrals ordered today:   Referral Orders  No referral(s) requested today     I have ordered the following medication/changed the following medications:   Stop the following medications: Medications Discontinued During This Encounter  Medication Reason   amlodipine -olmesartan  (AZOR ) 10-20 MG tablet      Start the following medications: Meds ordered this encounter  Medications   olmesartan  (BENICAR ) 20 MG tablet    Sig: Take 1 tablet (20 mg total) by mouth daily.    Dispense:  90 tablet    Refill:  3   amLODipine  (NORVASC ) 10 MG tablet    Sig: Take 1 tablet (10 mg total) by mouth daily as needed (Please take one tablet if your blood pressure is greater than 130/80.  ONLY take this AFTER you check your blood pressure 2 hours after taking olmesartan ).    Dispense:  90 tablet    Refill:  0     Follow up: 2 weeks with a nurse for a blood pressure check (bring cuff and log) one month with a provider   Call us  if the blood pressure continues to be lower than 100/60 while taking olmesartan !  Should you have any questions or concerns please call the internal medicine clinic at [phone].     Please note that our late policy has changed.  If you are more than 15 minutes late to your appointment, you may be asked to  reschedule your appointment.  Dr. Kandis, D.O. [facility]

## 2023-12-14 NOTE — Assessment & Plan Note (Signed)
 Patient has a history of hypothyroidism on a regimen of Synthroid  112 mcg daily.  Denies symptoms of hypothyroidism this time.  His last TSH was checked in March and was 0.720.   Plan: - Recheck TSH today

## 2023-12-15 LAB — LIPID PANEL
Chol/HDL Ratio: 3 ratio (ref 0.0–5.0)
Cholesterol, Total: 201 mg/dL — ABNORMAL HIGH (ref 100–199)
HDL: 68 mg/dL (ref >39)
LDL Chol Calc (NIH): 111 mg/dL — ABNORMAL HIGH (ref 0–99)
Triglycerides: 123 mg/dL (ref 0–149)
VLDL Cholesterol Cal: 22 mg/dL (ref 5–40)

## 2023-12-15 LAB — TSH: TSH: 1.81 u[IU]/mL (ref 0.450–4.500)

## 2023-12-15 LAB — BASIC METABOLIC PANEL WITH GFR
BUN/Creatinine Ratio: 11 (ref 10–24)
BUN: 11 mg/dL (ref 8–27)
CO2: 22 mmol/L (ref 20–29)
Calcium: 10 mg/dL (ref 8.6–10.2)
Chloride: 99 mmol/L (ref 96–106)
Creatinine, Ser: 0.98 mg/dL (ref 0.76–1.27)
Glucose: 112 mg/dL — ABNORMAL HIGH (ref 70–99)
Potassium: 4.3 mmol/L (ref 3.5–5.2)
Sodium: 139 mmol/L (ref 134–144)
eGFR: 86 mL/min/1.73 (ref >59)

## 2023-12-17 ENCOUNTER — Ambulatory Visit: Payer: Self-pay | Admitting: Student

## 2023-12-17 DIAGNOSIS — E785 Hyperlipidemia, unspecified: Secondary | ICD-10-CM

## 2023-12-27 ENCOUNTER — Ambulatory Visit: Admitting: *Deleted

## 2023-12-27 DIAGNOSIS — R030 Elevated blood-pressure reading, without diagnosis of hypertension: Secondary | ICD-10-CM

## 2023-12-27 NOTE — Progress Notes (Signed)
    Lynwood FORBES Daring presented today for blood pressure check. Patient is prescribed blood pressure medications and I confirmed that patient did take their blood pressure medication prior to today's appointment. Blood pressure was taken in the usual and appropriate manner using an automated BP cuff.     Vitals:   12/27/23 1311  BP: (!) 133/93      Results of today's visit will be routed to Dr. Lyle IMP Blure Teamf or review and further management.

## 2024-01-04 ENCOUNTER — Encounter: Payer: Self-pay | Admitting: Hematology and Oncology

## 2024-01-04 ENCOUNTER — Inpatient Hospital Stay: Payer: Medicaid Other | Admitting: Hematology and Oncology

## 2024-01-04 ENCOUNTER — Inpatient Hospital Stay: Payer: Medicaid Other | Attending: Hematology and Oncology

## 2024-01-04 VITALS — BP 144/97 | HR 85 | Temp 97.8°F | Resp 18 | Ht 65.0 in | Wt 130.8 lb

## 2024-01-04 DIAGNOSIS — Z923 Personal history of irradiation: Secondary | ICD-10-CM | POA: Diagnosis not present

## 2024-01-04 DIAGNOSIS — C321 Malignant neoplasm of supraglottis: Secondary | ICD-10-CM

## 2024-01-04 DIAGNOSIS — Z9221 Personal history of antineoplastic chemotherapy: Secondary | ICD-10-CM | POA: Diagnosis not present

## 2024-01-04 DIAGNOSIS — Z8521 Personal history of malignant neoplasm of larynx: Secondary | ICD-10-CM | POA: Diagnosis present

## 2024-01-04 LAB — CMP (CANCER CENTER ONLY)
ALT: 11 U/L (ref 0–44)
AST: 18 U/L (ref 15–41)
Albumin: 5 g/dL (ref 3.5–5.0)
Alkaline Phosphatase: 50 U/L (ref 38–126)
Anion gap: 6 (ref 5–15)
BUN: 10 mg/dL (ref 8–23)
CO2: 30 mmol/L (ref 22–32)
Calcium: 10 mg/dL (ref 8.9–10.3)
Chloride: 104 mmol/L (ref 98–111)
Creatinine: 1.22 mg/dL (ref 0.61–1.24)
GFR, Estimated: >60 mL/min (ref >60)
Glucose, Bld: 99 mg/dL (ref 70–99)
Potassium: 3.8 mmol/L (ref 3.5–5.1)
Sodium: 140 mmol/L (ref 135–145)
Total Bilirubin: 0.4 mg/dL (ref 0.0–1.2)
Total Protein: 7.9 g/dL (ref 6.5–8.1)

## 2024-01-04 LAB — CBC WITH DIFFERENTIAL (CANCER CENTER ONLY)
Abs Immature Granulocytes: 0.01 K/uL (ref 0.00–0.07)
Basophils Absolute: 0.1 K/uL (ref 0.0–0.1)
Basophils Relative: 1 %
Eosinophils Absolute: 0.1 K/uL (ref 0.0–0.5)
Eosinophils Relative: 2 %
HCT: 42.1 % (ref 39.0–52.0)
Hemoglobin: 14.3 g/dL (ref 13.0–17.0)
Immature Granulocytes: 0 %
Lymphocytes Relative: 24 %
Lymphs Abs: 1 K/uL (ref 0.7–4.0)
MCH: 31.4 pg (ref 26.0–34.0)
MCHC: 34 g/dL (ref 30.0–36.0)
MCV: 92.5 fL (ref 80.0–100.0)
Monocytes Absolute: 0.5 K/uL (ref 0.1–1.0)
Monocytes Relative: 13 %
Neutro Abs: 2.4 K/uL (ref 1.7–7.7)
Neutrophils Relative %: 60 %
Platelet Count: 240 K/uL (ref 150–400)
RBC: 4.55 MIL/uL (ref 4.22–5.81)
RDW: 14.4 % (ref 11.5–15.5)
WBC Count: 4 K/uL (ref 4.0–10.5)
nRBC: 0 % (ref 0.0–0.2)

## 2024-01-04 NOTE — Progress Notes (Signed)
 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, status post induction chemotherapy with Taxotere , cisplatin  and 5-FU for 4 cycles followed by radiation treatment, completed by July 2018 Pathology squamous cell carcinoma In 2019, he has residual disease noted and underwent total laryngectomy in May 2019 The patient has been cancer free since 2019 He is a long-term cancer survivor Due to concern for cancer recurrence, he wants to be followed once a year Examination today is satisfactory and show no evidence of recurrent disease I will see him back in a year for further follow-up  Orders Placed This Encounter  Procedures   CMP (Cancer Center only)    Standing Status:   Future    Expiration Date:   01/03/2025   CBC with Differential (Cancer Center Only)    Standing Status:   Future    Expiration Date:   01/03/2025     Almarie Bedford, MD  INTERVAL HISTORY: he returns for surveillance follow-up He has no concern for recurrent disease in his head and neck region Tracheostomy is functioning well  PHYSICAL EXAMINATION: ECOG PERFORMANCE STATUS: 0 - Asymptomatic  Vitals:   01/04/24 0852  BP: (!) 144/97  Pulse: 85  Resp: 18  Temp: 97.8 F (36.6 C)  SpO2: 100%   Filed Weights   01/04/24 0852  Weight: 130 lb 12.8 oz (59.3 kg)   GENERAL:alert, no distress and comfortable SKIN: skin color, texture, turgor are normal, no rashes or significant lesions EYES: normal, conjunctiva are pink and non-injected, sclera clear OROPHARYNX:no exudate, no erythema and lips, buccal mucosa, and tongue normal  NECK: Tracheostomy in situ.  No other abnormalities.  Good range of motion LYMPH:  no palpable lymphadenopathy in the  cervical, axillary or inguinal LUNGS: clear to auscultation and percussion with normal breathing effort HEART: regular rate & rhythm and no murmurs and no lower extremity edema ABDOMEN:abdomen soft, non-tender and normal bowel sounds Musculoskeletal:no cyanosis of digits and no clubbing  PSYCH: alert & oriented x 3 with fluent speech NEURO: no focal motor/sensory deficits  Relevant data reviewed during this visit included CBC and CMP

## 2024-01-04 NOTE — Assessment & Plan Note (Addendum)
 Patient was diagnosed with laryngeal cancer and 2018, status post induction chemotherapy with Taxotere , cisplatin  and 5-FU for 4 cycles followed by radiation treatment, completed by July 2018 Pathology squamous cell carcinoma In 2019, he has residual disease noted and underwent total laryngectomy in May 2019 The patient has been cancer free since 2019 He is a long-term cancer survivor Due to concern for cancer recurrence, he wants to be followed once a year Examination today is satisfactory and show no evidence of recurrent disease I will see him back in a year for further follow-up

## 2024-01-14 ENCOUNTER — Other Ambulatory Visit: Payer: Self-pay

## 2024-01-14 ENCOUNTER — Ambulatory Visit (INDEPENDENT_AMBULATORY_CARE_PROVIDER_SITE_OTHER): Admitting: Internal Medicine

## 2024-01-14 ENCOUNTER — Ambulatory Visit (INDEPENDENT_AMBULATORY_CARE_PROVIDER_SITE_OTHER)

## 2024-01-14 ENCOUNTER — Encounter (HOSPITAL_BASED_OUTPATIENT_CLINIC_OR_DEPARTMENT_OTHER): Payer: Self-pay

## 2024-01-14 VITALS — BP 120/70 | HR 100 | Ht 65.0 in | Wt 130.0 lb

## 2024-01-14 VITALS — BP 125/74 | HR 100 | Temp 99.0°F | Ht 65.0 in | Wt 129.2 lb

## 2024-01-14 DIAGNOSIS — Z8249 Family history of ischemic heart disease and other diseases of the circulatory system: Secondary | ICD-10-CM

## 2024-01-14 DIAGNOSIS — H53121 Transient visual loss, right eye: Secondary | ICD-10-CM

## 2024-01-14 DIAGNOSIS — Z91148 Patient's other noncompliance with medication regimen for other reason: Secondary | ICD-10-CM

## 2024-01-14 DIAGNOSIS — Z79899 Other long term (current) drug therapy: Secondary | ICD-10-CM

## 2024-01-14 DIAGNOSIS — E785 Hyperlipidemia, unspecified: Secondary | ICD-10-CM | POA: Diagnosis not present

## 2024-01-14 DIAGNOSIS — I10 Essential (primary) hypertension: Secondary | ICD-10-CM

## 2024-01-14 DIAGNOSIS — E7841 Elevated Lipoprotein(a): Secondary | ICD-10-CM | POA: Diagnosis not present

## 2024-01-14 DIAGNOSIS — Z1211 Encounter for screening for malignant neoplasm of colon: Secondary | ICD-10-CM

## 2024-01-14 DIAGNOSIS — Z23 Encounter for immunization: Secondary | ICD-10-CM

## 2024-01-14 MED ORDER — TETANUS-DIPHTH-ACELL PERTUSSIS 5-2-15.5 LF-MCG/0.5 IM SUSP
0.5000 mL | INTRAMUSCULAR | 0 refills | Status: AC
  Filled 2024-01-14: qty 0.5, 1d supply, fill #0

## 2024-01-14 MED ORDER — ZOSTER VAC RECOMB ADJUVANTED 50 MCG/0.5ML IM SUSR
0.5000 mL | Freq: Once | INTRAMUSCULAR | 0 refills | Status: AC
  Filled 2024-01-14: qty 1, 1d supply, fill #0

## 2024-01-14 NOTE — Assessment & Plan Note (Signed)
 Patient was able to see Dr. Mona today.  Patient does report some medication nonadherence with his statin and ezetimibe .  Plan for this is for patient to adhere to medications and we will recheck lipid panel in 3 months with cardiology.  Plan: Encouraged patient to adhere to this medication regimen Appreciate assistance from cardiology for this

## 2024-01-14 NOTE — Progress Notes (Signed)
 LIPID CLINIC CONSULT NOTE  Chief Complaint:  Manage dyslipidemia  Primary Care Physician: Leontine Lapine, MD  Primary Cardiologist:  None  HPI:  Jonathan [name] is a 63 y.o. male who is being seen today for the evaluation of dyslipidemia at the request of Shawn Sick, MD. This is a pleasant 63 year old male kindly referred for evaluation management of dyslipidemia.  He has a history of hypertension and dyslipidemia but unfortunately has had some medication noncompliance.  He has been caring for his wife who has dementia.  Recently he noted that he has been reasonably noncompliant with his medication and his sense that he probably is not taking his medications about 50% of the time.  More recently within the past several weeks however he says he has been very compliant with both the rosuvastatin  and the ezetimibe .  His last labs were in September which showing total cholesterol 201, triglycerides 123, HDL 68 and LDL 111.  His goal LDL is less than 70 given a history of possible TIA/amaurosis fugax which may have been related to cerebrovascular disease, exacerbated by radiation for laryngeal cancer.  PMHx:  Past Medical History:  Diagnosis Date   Allergic rhinitis    Arthritis    Bilateral knee arthritis for about 20 years. Not taking any medications, believes in nature processes   Colon polyp    Dental caries    prechemoradiation therapy, periodontitis   Elevated blood pressure reading    White Coat Hypertension   GERD (gastroesophageal reflux disease)    Headache    History of radiation therapy 09/11/16- 10/30/16   Larynx 70 Gy in 35 fractions   HLD (hyperlipidemia)    Hypothyroidism    radiation induced hypothyroidism   Internal hemorrhoids    Laryngeal cancer (HCC)    Pneumonia    as a child   Tubular adenoma of colon     Past Surgical History:  Procedure Laterality Date   COLONOSCOPY  03/18/2012   DIRECT LARYNGOSCOPY N/A 07/06/2017   Procedure: DIRECT LARYNGOSCOPY;   Surgeon: Jesus Oliphant, MD;  Location: [facility] OR;  Service: ENT;  Laterality: N/A;   ESOPHAGOSCOPY  04/24/2016   Procedure: ESOPHAGOSCOPY;  Surgeon: Oliphant Jesus, MD;  Location: [facility] OR;  Service: ENT;;   EVALUATION UNDER ANESTHESIA WITH HEMORRHOIDECTOMY N/A 05/15/2019   Procedure: ANORECTAL EXAM UNDER ANESTHESIA WITH EXCISION; HEMORRHOIDECTOMY, FULGURATION OF FLAT CONDYLOMA;  Surgeon: Teresa Lonni HERO, MD;  Location: [facility] ORS;  Service: General;  Laterality: N/A;   IR ANGIO INTRA EXTRACRAN SEL COM CAROTID INNOMINATE BILAT MOD SED  01/05/2023   IR ANGIO VERTEBRAL SEL SUBCLAVIAN INNOMINATE UNI L MOD SED  01/05/2023   IR ANGIO VERTEBRAL SEL VERTEBRAL UNI R MOD SED  01/05/2023   IR GENERIC HISTORICAL  06/02/2016   IR US  GUIDE VASC ACCESS RIGHT 06/02/2016 Norleen Roulette, MD [facility]-INTERV RAD   IR GENERIC HISTORICAL  06/02/2016   IR FLUORO GUIDE PORT INSERTION RIGHT 06/02/2016 Norleen Roulette, MD [facility]INTER[facility]   IR RADIOLOGIST EVAL & MGMT  12/18/2022   IR REMOVAL TUN ACCESS W/ PORT W/O FL MOD SED  04/16/2018   IR US  GUIDE VASC ACCESS RIGHT  01/05/2023   LARYNGETOMY N/A 08/10/2017   Procedure: TOTAL LARYNGECTOMY;  Surgeon: Jesus Oliphant, MD;  Location: [facility] OR;  Service: ENT;  Laterality: N/A;   LARYNGOSCOPY  04/24/2016   Procedure: LARYNGOSCOPY;  Surgeon: Oliphant Jesus, MD;  Location: [facility] OR;  Service: ENT;;   MULTIPLE EXTRACTIONS WITH ALVEOLOPLASTY N/A 05/26/2016   Procedure: Extraction of  tooth #'s 2,5,6,8-13, 21-28 with alveoloplasty, bilateral mandibular tori reductions and buccal exostoses reductions of maxillary left, maxillary right, and mandibular left quadrants.;  Surgeon: Tanda JULIANNA Fanny, DDS;  Location: [facility] OR;  Service: Oral Surgery;  Laterality: N/A;   STOMAPLASTY N/A 06/05/2018   Procedure: Tracheal STOMAPLASTY;  Surgeon: Jesus Oliphant, MD;  Location: [facility] OR;  Service: ENT;  Laterality: N/A;   TRACHEAL ESOPHAGEAL PROSTHESIS (TEP) CHANGE N/A 09/25/2018   Procedure: TRACHEAL ESOPHAGEAL PROSTHESIS (TEP) CHANGE;  Surgeon: Jesus Oliphant, MD;  Location: [facility] OR;  Service: ENT;  Laterality: N/A;   TRACHEOSTOMY TUBE PLACEMENT N/A 04/24/2016   Procedure: TRACHEOSTOMY;  Surgeon: Oliphant Jesus, MD;  Location: [facility] OR;  Service: ENT;  Laterality: N/A;    FAMHx:  Family History  Problem Relation Age of Onset   Hypertension Mother    CAD Father    Prostate cancer Maternal Uncle 66       On radiation therapy now, diagnosed in around June 2012   Colon cancer Maternal Uncle    Kidney disease Maternal Uncle    Lung cancer Paternal Aunt    Brain cancer Paternal Aunt    Heart attack Paternal Grandfather        Diet due to MI   Colon polyps Neg Hx    Diabetes Neg Hx    Gallbladder disease Neg Hx    Esophageal cancer Neg Hx    Stroke Neg Hx     SOCHx:   reports that he quit smoking about 8 years ago. His smoking use included cigarettes. He started smoking about 38 years ago. He has a 15 pack-year smoking history. He has never used smokeless tobacco. He reports current alcohol use. He reports that he does not use drugs.  ALLERGIES:  No Known Allergies  ROS: Pertinent items noted in HPI and remainder of comprehensive ROS otherwise negative.  HOME MEDS: Current Outpatient Medications on File Prior to Visit  Medication Sig Dispense Refill   acetaminophen  (TYLENOL ) 500 MG tablet Take 1,000 mg by mouth every 6 (six) hours as needed for moderate pain or headache.     amLODipine  (NORVASC ) 10 MG tablet Take 1 tablet (10 mg total) by mouth daily as needed (Please take one tablet if your blood pressure is greater than 130/80.  ONLY take this AFTER you check your blood pressure 2 hours after taking olmesartan ). 90 tablet 0   aspirin  EC 81 MG tablet Take 1 tablet (81 mg total) by mouth daily. Swallow whole. 90 tablet 3   ezetimibe  (ZETIA ) 10 MG tablet Take 1 tablet (10 mg total) by mouth daily. 30 tablet 11   levothyroxine  (SYNTHROID ) 112 MCG tablet Take 1 tablet (112 mcg total) by mouth daily before breakfast. 90 tablet 3   olmesartan   (BENICAR ) 20 MG tablet Take 1 tablet (20 mg total) by mouth daily. 90 tablet 3   omeprazole  (PRILOSEC) 20 MG capsule TAKE 1 CAPSULE(20 MG) BY MOUTH DAILY 90 capsule 1   rosuvastatin  (CRESTOR ) 40 MG tablet TAKE 1 TABLET(40 MG) BY MOUTH DAILY 30 tablet 11   No current facility-administered medications on file prior to visit.    LABS/IMAGING: No results found for this or any previous visit (from the past 48 hours). No results found.  LIPID PANEL:    Component Value Date/Time   CHOL 201 (H) 12/14/2023 0905   TRIG 123 12/14/2023 0905   HDL 68 12/14/2023 0905   CHOLHDL 3.0 12/14/2023 0905   CHOLHDL 2.4 01/09/2014 1534   VLDL 10  01/09/2014 1534   LDLCALC 111 (H) 12/14/2023 0905    No results found for: LIPOA   WEIGHTS: Wt Readings from Last 3 Encounters:  01/14/24 130 lb (59 kg)  01/04/24 130 lb 12.8 oz (59.3 kg)  12/14/23 129 lb 9.6 oz (58.8 kg)    VITALS: BP 120/70 (BP Location: Left Arm, Patient Position: Sitting, Cuff Size: Normal)   Pulse 100   Ht 5' 5 (1.651 m)   Wt 130 lb (59 kg)   SpO2 97%   BMI 21.63 kg/m   EXAM: Deferred  EKG: Deferred  ASSESSMENT: Dyslipidemia, goal LDL less than 70 Medication noncompliance 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   Jonathan [name] has a dyslipidemia but remains above a target LDL less than 70 on combination therapy with high intensity statin and ezetimibe .  He has not however been very compliant with medications until recently.  He realizes he needs to be more compliant with them and his blood pressure reflects that today at 120/70.  I think we need to give him a few more months to see if his lipids improve and perhaps his current therapies may be all that he needs.  I would recommend a recheck of his lipids in about 3 months including NMR and LP(a).  Further recommendations at that time based on those numbers.  Thanks again for the  kind referral.  Vinie KYM Maxcy, MD, [facility], FNLA, FACP  [place]  [facility]  Medical Director of the Advanced Lipid Disorders &  Cardiovascular Risk Reduction Clinic Diplomate of the American Board of Clinical Lipidology Attending Cardiologist  Direct Dial: [phone]  Fax: [phone]  Website:  [email or web address]  Vinie BROCKS Keith Cancio 01/14/2024, 10:11 AM

## 2024-01-14 NOTE — Patient Instructions (Addendum)
 Today we discussed the following medical conditions and plan:   For your blood pressure, I would recommend getting a new blood pressure machine since there were some differences between blood pressures we were getting and yours  For now only take the olmesartan  20 mg if your blood pressure is greater than 120. If not do not take this medication. Please write down your blood pressures every day and bring this to your next visit along with your new cuff.  I have sent a referral for your colonoscopy I have also sent those vaccines to our pharmacy.   We look forward to seeing you next time. Please call our clinic at [phone] if you have any questions or concerns. The best time to call is Monday-Friday from 9am-4pm, but there is someone available 24/7. If you need medication refills, please notify your pharmacy one week in advance and they will send us  a request.   Thank you for trusting me with your care. Wishing you the best!   Remonia Romano, DO  [facility]    Call heart doctor's office at :  [phone].

## 2024-01-14 NOTE — Patient Instructions (Signed)
 Medication Instructions:  No changes *If you need a refill on your cardiac medications before your next appointment, please call your pharmacy*  Lab Work: Return in 3 months for blood work (NMR Lipoprofile, Lp(a))  Testing/Procedures: none  Follow-Up: At [facility], you and your health needs are our priority.  As part of our continuing mission to provide you with exceptional heart care, our providers are all part of one team.  This team includes your primary Cardiologist (physician) and Advanced Practice Providers or APPs (Physician Assistants and Nurse Practitioners) who all work together to provide you with the care you need, when you need it.  Your next appointment:   3-4 month(s)  Provider:   Rosaline Bane, NP

## 2024-01-14 NOTE — Assessment & Plan Note (Signed)
 Patient's blood pressure today was 125/74.  Patient has not taken his olmesartan  20 mg this morning but states he has been taking it for the last 2 days.  Patient states that he has been taking the olmesartan  20 mg but has had some episodes of lightheadedness and dizziness.  He is unsure of what his blood pressure is at that time when he is having these episodes but states his blood pressure 1 morning was about 114 systolics.  He states what usually happens is that he will take the olmesartan  20 daily but then if he notices that his blood pressure is low when he checks it in the morning will not take medication.  He will go multiple days without taking the medication and that his blood pressure goes up to about 160s systolics.  He states that he takes his olmesartan  about 2-3 times a week and when his blood pressure goes up he still takes the combination of olmesartan  and amlodipine  that he had previously and not the amlodipine  10 mg stand-alone that was prescribed at the last visit.  At last visit patient's regimen was olmesartan  20 mg with amlodipine  10 mg as needed.  Plan: Patient's blood pressure cuff was checked today and there was about a 20 mmHg difference in systolics between our blood pressure cuff and his.  Due to this did recommend the patient buy another blood pressure cuff and bring it in with him at the next visit so we can check to make sure that it works Will stop amlodipine  10 mg at this time. Will continue olmesartan  20 mg as needed for blood pressures above 120. Did tell patient to log what his blood pressures are daily and whether or not he takes medication to assess for need for blood pressure medication

## 2024-01-14 NOTE — Progress Notes (Unsigned)
 Established Patient Office Visit  Subjective   Patient ID: Jonathan [name], male    DOB: November 22, 1960  Age: 63 y.o. MRN: [id number]  Chief Complaint  Patient presents with   Follow-up    HPI Prather Failla. 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  See problem-based assessment for more details {History (Optional):23778}  ROS See problem-based assessment for more details   Objective:     BP 125/74 (BP Location: Left Arm, Patient Position: Sitting, Cuff Size: Small)   Pulse 100   Temp 99 F (37.2 C) (Oral)   Ht 5' 5 (1.651 m)   Wt 129 lb 3.2 oz (58.6 kg)   SpO2 97%   BMI 21.50 kg/m  BP Readings from Last 3 Encounters:  01/14/24 125/74  01/14/24 120/70  01/04/24 (!) 144/97   Wt Readings from Last 3 Encounters:  01/14/24 129 lb 3.2 oz (58.6 kg)  01/14/24 130 lb (59 kg)  01/04/24 130 lb 12.8 oz (59.3 kg)      Physical Exam Constitution: Alert, no acute distress Heart: Regular rate and rhythm Lungs: Respiratory effort normal, no wheezes auscultated Lower extremities: No bilateral edema noted in lower extremities  No results found for any visits on 01/14/24.  {Labs (Optional):23779}  The 10-year ASCVD risk score (Arnett DK, et al., 2019) is: 14.2%    Assessment & Plan:   Problem List Items Addressed This Visit     HLD (hyperlipidemia) (Chronic)   Patient was able to see Dr. Mona today.  Patient does report some medication nonadherence with his statin and ezetimibe .  Plan for this is for patient to adhere to medications and we will recheck lipid panel in 3 months with cardiology.  Plan: Encouraged patient to adhere to this medication regimen Appreciate assistance from cardiology for this      White coat  syndrome with diagnosis of hypertension   Patient's blood pressure today was 125/74.  Patient has not taken his olmesartan  20 mg this morning but states he has been taking it for the last 2 days.  Patient states that he has been taking the olmesartan  20 mg but has had some episodes of lightheadedness and dizziness.  He is unsure of what his blood pressure is at that time when he is having these episodes but states his blood pressure 1 morning was about 114 systolics.  He states what usually happens is that he will take the olmesartan  20 daily but then if he notices that his blood pressure is low when he checks it in the morning will not take medication.  He will go multiple days without taking the medication and that his blood pressure goes up to about 160s systolics.  He states that he takes his olmesartan  about 2-3 times a week and when his blood pressure goes up he still takes the combination of olmesartan  and amlodipine  that he had previously and not the amlodipine  10 mg stand-alone that was prescribed at the last visit.  At last visit patient's regimen was olmesartan  20 mg with amlodipine  10 mg as needed.  Plan: Patient's blood pressure cuff was checked today and there was about a 20 mmHg difference in systolics between our blood pressure cuff and his.  Due to this did recommend the patient buy another blood pressure cuff and bring it in  with him at the next visit so we can check to make sure that it works Will stop amlodipine  10 mg at this time. Will continue olmesartan  20 mg as needed for blood pressures above 120. Did tell patient to log what his blood pressures are daily and whether or not he takes medication to assess for need for blood pressure medication      Other Visit Diagnoses       Screening for colon cancer    -  Primary   Relevant Orders   Ambulatory referral to Gastroenterology       HTN Olmesartan  20 mg  Taking combo pill twice a week  since I last saw you  Checking blood  pressures Blood pressure cuff  Hyperlipidemia NMR and lipoprotein A   Shingles vaccimne  No follow-ups on file.    Shaquia Berkley D'Mello, DO

## 2024-01-15 NOTE — Assessment & Plan Note (Signed)
 Tdap and shringrix sent to pharmacy

## 2024-01-15 NOTE — Progress Notes (Signed)
 Internal Medicine Clinic Attending  I was physically present during the key portions of the resident provided service and participated in the medical decision making of patient's management care. I reviewed pertinent patient test results.  The assessment, diagnosis, and plan were formulated together and I agree with the documentation in the resident's note.  Lovie Clarity, MD   Patient here mainly for HTN follow up. We discovered that his home BP cuff was reading higher than ours - his was SBP 140s and ours was SBP 120s today. He is going to get a new cuff.  We will stop Amlodipine  (which he hasn't been taking).  Continue Olmesartan  20mg  daily. If home SBPs with new cuff are <120, he can hold the Olmesartan . He was instructed to bring his new cuff & BP log to his follow up appointment. He may not even need the Olmesartan .

## 2024-01-24 ENCOUNTER — Institutional Professional Consult (permissible substitution) (HOSPITAL_BASED_OUTPATIENT_CLINIC_OR_DEPARTMENT_OTHER): Admitting: Internal Medicine

## 2024-02-07 ENCOUNTER — Other Ambulatory Visit: Payer: Self-pay

## 2024-02-07 DIAGNOSIS — E785 Hyperlipidemia, unspecified: Secondary | ICD-10-CM

## 2024-02-07 MED ORDER — ROSUVASTATIN CALCIUM 40 MG PO TABS: 40.0000 mg | ORAL_TABLET | Freq: Every day | ORAL | 11 refills | Status: AC

## 2024-02-14 ENCOUNTER — Ambulatory Visit: Admitting: Student

## 2024-02-14 VITALS — BP 154/95 | HR 82 | Ht 65.0 in | Wt 129.4 lb

## 2024-02-14 DIAGNOSIS — I10 Essential (primary) hypertension: Secondary | ICD-10-CM

## 2024-02-14 NOTE — Assessment & Plan Note (Signed)
 Home blood pressure readings indicate good control, suggesting a white coat effect due to higher in-office readings. New home cuff shows consistent readings. - Scheduled nursing visit next week to compare home and office readings. - Instructed to bring new blood pressure cuff to next appointment for comparison. - If home blood pressure cuff and our blood pressure cuff here in the office are similar, can make the diagnosis of whitecoat hypertension.

## 2024-02-14 NOTE — Patient Instructions (Signed)
 Thank you so much for coming to the clinic today!   I would like you to come back in a week for a nursing visit for your blood pressure and PLEASE bring in your blood pressure cuff with you.   If you have any questions please feel free to the call the clinic at anytime at [phone]. It was a pleasure seeing you!  Best, Dr. Clair Bardwell

## 2024-02-14 NOTE — Progress Notes (Signed)
 CC: HTN follow up   HPI:  Jonathan [name] is a 63 y.o. male living with a history stated below and presents today for hypertension follow-up. Please see problem based assessment and plan for additional details.  Discussed the use of AI scribe software for clinical note transcription with the patient, who gave verbal consent to proceed.  History of Present Illness Jonathan [name] is a 63 year old male with hypertension who presents with concerns about blood pressure management.  He has been monitoring his blood pressure at home, with readings typically around 125/71 mmHg. He reports that he believes his higher blood pressure readings in the clinic may be due to 'white coat syndrome'.  He recently acquired a new blood pressure cuff. He checks his blood pressure every morning, noting consistent readings in the 120-130 mmHg range. He has not needed to take his blood pressure medication for the past four days, except once, due to these stable readings.  He takes olmesartan  20 mg daily.  His wife was recently hospitalized for a blood clot in her arm, requiring him to sleep in a recliner, affecting his rest. She has since recovered after treatment.  He confirms taking his medication this morning and typically measures his blood pressure after his morning walk.    Past Medical History:  Diagnosis Date   Allergic rhinitis    Arthritis    Bilateral knee arthritis for about 20 years. Not taking any medications, believes in nature processes   Colon polyp    Dental caries    prechemoradiation therapy, periodontitis   Elevated blood pressure reading    White Coat Hypertension   GERD (gastroesophageal reflux disease)    Headache    History of radiation therapy 09/11/16- 10/30/16   Larynx 70 Gy in 35 fractions   HLD (hyperlipidemia)    Hypothyroidism    radiation induced hypothyroidism   Internal hemorrhoids    Laryngeal cancer (HCC)    Pneumonia    as a child   Tubular adenoma of colon      Current Outpatient Medications on File Prior to Visit  Medication Sig Dispense Refill   acetaminophen  (TYLENOL ) 500 MG tablet Take 1,000 mg by mouth every 6 (six) hours as needed for moderate pain or headache.     aspirin  EC 81 MG tablet Take 1 tablet (81 mg total) by mouth daily. Swallow whole. 90 tablet 3   ezetimibe  (ZETIA ) 10 MG tablet Take 1 tablet (10 mg total) by mouth daily. 30 tablet 11   levothyroxine  (SYNTHROID ) 112 MCG tablet Take 1 tablet (112 mcg total) by mouth daily before breakfast. 90 tablet 3   olmesartan  (BENICAR ) 20 MG tablet Take 1 tablet (20 mg total) by mouth daily. 90 tablet 3   omeprazole  (PRILOSEC) 20 MG capsule TAKE 1 CAPSULE(20 MG) BY MOUTH DAILY 90 capsule 1   rosuvastatin  (CRESTOR ) 40 MG tablet Take 1 tablet (40 mg total) by mouth daily. TAKE 1 TABLET(40 MG) BY MOUTH DAILY 30 tablet 11   Tdap (ADACEL) 08-02-13.5 LF-MCG/0.5 injection Inject 0.5 mLs into the muscle. 0.5 mL 0   No current facility-administered medications on file prior to visit.    Family History  Problem Relation Age of Onset   Hypertension Mother    CAD Father    Prostate cancer Maternal Uncle 25       On radiation therapy now, diagnosed in around June 2012   Colon cancer Maternal Uncle    Kidney disease Maternal Uncle  Lung cancer Paternal Aunt    Brain cancer Paternal Aunt    Heart attack Paternal Grandfather        Diet due to MI   Colon polyps Neg Hx    Diabetes Neg Hx    Gallbladder disease Neg Hx    Esophageal cancer Neg Hx    Stroke Neg Hx     Social History   Socioeconomic History   Marital status: Single    Spouse name: Not on file   Number of children: 4   Years of education: Not on file   Highest education level: Not on file  Occupational History   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   Drug use: No    Frequency: 2.0 times per week    Comment: denies   Sexual activity: Yes    Partners: Female  Other Topics Concern   Not on file  Social History Narrative   Lives in [place] with his fiance.   Moved to [place] 11 years before. Used to live in [place] for 20 years before that.   Has 4 healthy kids from previous wife- separated now.   Is not working full-time since last 2 and half years- since early 2010.   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   Social Drivers of Corporate Investment Banker Strain: Low Risk  (05/24/2022)   Overall Financial Resource Strain (CARDIA)    Difficulty of Paying Living Expenses: Not very hard  Food Insecurity: No Food Insecurity (02/14/2024)   Hunger Vital Sign    Worried About Running Out of Food in the Last Year: Never true    Ran Out of Food in the Last Year: Never true  Transportation Needs: No Transportation Needs (02/14/2024)   PRAPARE - Administrator, Civil Service (Medical): No    Lack of Transportation (Non-Medical): No  Physical Activity: Sufficiently Active (05/24/2022)   Exercise Vital Sign    Days of Exercise per Week: 7 days    Minutes of Exercise per Session: 40 min  Stress: No Stress Concern Present (05/24/2022)   [employer] of Occupational Health - Occupational Stress Questionnaire    Feeling of Stress : Only a little  Social Connections: Moderately Integrated (05/24/2022)   Social Connection and Isolation Panel    Frequency of Communication with Friends and Family: More than three times a week    Frequency of Social Gatherings with Friends and Family: More than three times a week    Attends Religious Services: More than 4 times per year     Active Member of [employer] or Organizations: No    Attends Banker Meetings: Never    Marital Status: Living with partner  Intimate Partner Violence: Not At Risk (02/14/2024)   Humiliation, Afraid, Rape, and Kick questionnaire    Fear of Current or Ex-Partner: No    Emotionally Abused: No    Physically Abused: No    Sexually Abused: No    Review of  Systems: ROS negative except for what is noted on the assessment and plan.  Vitals:   02/14/24 1315 02/14/24 1320 02/14/24 1335  BP: (!) 172/95 (!) 157/103 (!) 154/95  Pulse: 89 90 82  Weight: 129 lb 6.4 oz (58.7 kg)    Height: 5' 5 (1.651 m)      Physical Exam: Constitutional: well-appearing male in no acute distress Cardiovascular: regular rate and rhythm, no m/r/g Pulmonary/Chest: normal work of breathing on room air, lungs clear to auscultation bilaterally Abdominal: soft, non-tender, non-distended   Assessment & Plan:   White coat syndrome with diagnosis of hypertension Home blood pressure readings indicate good control, suggesting a white coat effect due to higher in-office readings. New home cuff shows consistent readings. - Scheduled nursing visit next week to compare home and office readings. - Instructed to bring new blood pressure cuff to next appointment for comparison. - If home blood pressure cuff and our blood pressure cuff here in the office are similar, can make the diagnosis of whitecoat hypertension.    Patient discussed with Dr. Chambliss    Shakiara Lukic, M.D. [facility] Internal Medicine, PGY-3 Pager: [phone] Date 02/14/2024 Time 2:51 PM

## 2024-02-14 NOTE — Progress Notes (Signed)
 Internal Medicine Clinic Attending  Case discussed with the resident at the time of the visit.  We reviewed the resident's history and exam and pertinent patient test results.  I agree with the assessment, diagnosis, and plan of care documented in the resident's note.

## 2024-02-21 ENCOUNTER — Ambulatory Visit

## 2024-04-24 ENCOUNTER — Other Ambulatory Visit: Payer: Self-pay | Admitting: *Deleted

## 2024-04-24 DIAGNOSIS — I6521 Occlusion and stenosis of right carotid artery: Secondary | ICD-10-CM

## 2024-04-24 DIAGNOSIS — E7841 Elevated Lipoprotein(a): Secondary | ICD-10-CM

## 2024-04-24 MED ORDER — EZETIMIBE 10 MG PO TABS: 10.0000 mg | ORAL_TABLET | Freq: Every day | ORAL | 1 refills | Status: AC

## 2024-05-14 ENCOUNTER — Encounter (HOSPITAL_BASED_OUTPATIENT_CLINIC_OR_DEPARTMENT_OTHER): Admitting: Nurse Practitioner

## 2025-01-06 ENCOUNTER — Ambulatory Visit: Admitting: Hematology and Oncology

## 2025-01-06 ENCOUNTER — Other Ambulatory Visit
